# Patient Record
Sex: Female | Born: 1960 | Race: White | Hispanic: No | Marital: Married | Smoking: Former smoker
Health system: Southern US, Community
[De-identification: ages and names within clinical notes are randomized; demographics above are authoritative.]

## PROBLEM LIST (undated history)

## (undated) DIAGNOSIS — C50919 Malignant neoplasm of unspecified site of unspecified female breast: Secondary | ICD-10-CM

## (undated) DIAGNOSIS — Z9221 Personal history of antineoplastic chemotherapy: Secondary | ICD-10-CM

## (undated) DIAGNOSIS — K259 Gastric ulcer, unspecified as acute or chronic, without hemorrhage or perforation: Secondary | ICD-10-CM

## (undated) DIAGNOSIS — G709 Myoneural disorder, unspecified: Secondary | ICD-10-CM

## (undated) DIAGNOSIS — Z923 Personal history of irradiation: Secondary | ICD-10-CM

## (undated) DIAGNOSIS — K219 Gastro-esophageal reflux disease without esophagitis: Secondary | ICD-10-CM

## (undated) DIAGNOSIS — R600 Localized edema: Secondary | ICD-10-CM

## (undated) DIAGNOSIS — M2550 Pain in unspecified joint: Secondary | ICD-10-CM

## (undated) DIAGNOSIS — F419 Anxiety disorder, unspecified: Secondary | ICD-10-CM

## (undated) DIAGNOSIS — F32A Depression, unspecified: Secondary | ICD-10-CM

## (undated) DIAGNOSIS — C801 Malignant (primary) neoplasm, unspecified: Secondary | ICD-10-CM

## (undated) DIAGNOSIS — E559 Vitamin D deficiency, unspecified: Secondary | ICD-10-CM

## (undated) DIAGNOSIS — N200 Calculus of kidney: Secondary | ICD-10-CM

## (undated) DIAGNOSIS — E785 Hyperlipidemia, unspecified: Secondary | ICD-10-CM

## (undated) HISTORY — DX: Vitamin D deficiency, unspecified: E55.9

## (undated) HISTORY — DX: Hyperlipidemia, unspecified: E78.5

## (undated) HISTORY — DX: Localized edema: R60.0

## (undated) HISTORY — DX: Myoneural disorder, unspecified: G70.9

## (undated) HISTORY — DX: Gastric ulcer, unspecified as acute or chronic, without hemorrhage or perforation: K25.9

## (undated) HISTORY — DX: Depression, unspecified: F32.A

## (undated) HISTORY — DX: Pain in unspecified joint: M25.50

## (undated) HISTORY — PX: AUGMENTATION MAMMAPLASTY: SUR837

## (undated) HISTORY — DX: Gastro-esophageal reflux disease without esophagitis: K21.9

## (undated) HISTORY — DX: Calculus of kidney: N20.0

## (undated) HISTORY — PX: BREAST LUMPECTOMY: SHX2

## (undated) HISTORY — DX: Anxiety disorder, unspecified: F41.9

---

## 1998-01-20 HISTORY — PX: COSMETIC SURGERY: SHX468

## 2001-09-13 ENCOUNTER — Encounter: Payer: Self-pay | Admitting: Family Medicine

## 2001-09-13 ENCOUNTER — Encounter: Admission: RE | Admit: 2001-09-13 | Discharge: 2001-09-13 | Payer: Self-pay | Admitting: Family Medicine

## 2001-09-23 ENCOUNTER — Other Ambulatory Visit: Admission: RE | Admit: 2001-09-23 | Discharge: 2001-09-23 | Payer: Self-pay | Admitting: Obstetrics and Gynecology

## 2001-10-01 ENCOUNTER — Encounter: Payer: Self-pay | Admitting: Obstetrics and Gynecology

## 2001-10-01 ENCOUNTER — Encounter: Admission: RE | Admit: 2001-10-01 | Discharge: 2001-10-01 | Payer: Self-pay | Admitting: Obstetrics and Gynecology

## 2003-01-10 ENCOUNTER — Other Ambulatory Visit: Admission: RE | Admit: 2003-01-10 | Discharge: 2003-01-10 | Payer: Self-pay | Admitting: Obstetrics and Gynecology

## 2003-06-12 ENCOUNTER — Ambulatory Visit (HOSPITAL_COMMUNITY): Admission: RE | Admit: 2003-06-12 | Discharge: 2003-06-12 | Payer: Self-pay | Admitting: Urology

## 2004-01-18 ENCOUNTER — Other Ambulatory Visit: Admission: RE | Admit: 2004-01-18 | Discharge: 2004-01-18 | Payer: Self-pay | Admitting: Obstetrics and Gynecology

## 2004-01-29 ENCOUNTER — Encounter: Admission: RE | Admit: 2004-01-29 | Discharge: 2004-01-29 | Payer: Self-pay | Admitting: Obstetrics and Gynecology

## 2004-07-24 ENCOUNTER — Encounter: Admission: RE | Admit: 2004-07-24 | Discharge: 2004-07-24 | Payer: Self-pay | Admitting: Obstetrics and Gynecology

## 2005-01-31 ENCOUNTER — Encounter: Admission: RE | Admit: 2005-01-31 | Discharge: 2005-01-31 | Payer: Self-pay | Admitting: Obstetrics and Gynecology

## 2005-03-18 ENCOUNTER — Other Ambulatory Visit: Admission: RE | Admit: 2005-03-18 | Discharge: 2005-03-18 | Payer: Self-pay | Admitting: Obstetrics and Gynecology

## 2005-05-28 ENCOUNTER — Encounter: Admission: RE | Admit: 2005-05-28 | Discharge: 2005-05-28 | Payer: Self-pay | Admitting: Obstetrics and Gynecology

## 2005-07-10 ENCOUNTER — Encounter: Admission: RE | Admit: 2005-07-10 | Discharge: 2005-07-10 | Payer: Self-pay | Admitting: Urology

## 2006-09-11 ENCOUNTER — Encounter: Admission: RE | Admit: 2006-09-11 | Discharge: 2006-09-11 | Payer: Self-pay | Admitting: Obstetrics and Gynecology

## 2008-03-08 ENCOUNTER — Encounter: Admission: RE | Admit: 2008-03-08 | Discharge: 2008-03-08 | Payer: Self-pay | Admitting: Obstetrics and Gynecology

## 2008-04-14 ENCOUNTER — Emergency Department (HOSPITAL_COMMUNITY): Admission: EM | Admit: 2008-04-14 | Discharge: 2008-04-14 | Payer: Self-pay | Admitting: Family Medicine

## 2009-03-20 ENCOUNTER — Encounter: Admission: RE | Admit: 2009-03-20 | Discharge: 2009-03-20 | Payer: Self-pay | Admitting: Obstetrics and Gynecology

## 2010-02-10 ENCOUNTER — Encounter: Payer: Self-pay | Admitting: Obstetrics and Gynecology

## 2010-05-01 ENCOUNTER — Other Ambulatory Visit: Payer: Self-pay | Admitting: Obstetrics and Gynecology

## 2010-05-01 DIAGNOSIS — Z1231 Encounter for screening mammogram for malignant neoplasm of breast: Secondary | ICD-10-CM

## 2010-05-02 LAB — POCT URINALYSIS DIP (DEVICE)
Bilirubin Urine: NEGATIVE
Glucose, UA: NEGATIVE mg/dL
Ketones, ur: NEGATIVE mg/dL
Nitrite: NEGATIVE

## 2010-05-02 LAB — POCT PREGNANCY, URINE: Preg Test, Ur: NEGATIVE

## 2010-05-08 ENCOUNTER — Ambulatory Visit
Admission: RE | Admit: 2010-05-08 | Discharge: 2010-05-08 | Disposition: A | Discharge status: Home / Self Care | Payer: 59 | Source: Ambulatory Visit | Attending: Obstetrics and Gynecology | Admitting: Obstetrics and Gynecology

## 2010-05-08 DIAGNOSIS — Z1231 Encounter for screening mammogram for malignant neoplasm of breast: Secondary | ICD-10-CM

## 2011-10-06 ENCOUNTER — Other Ambulatory Visit: Payer: Self-pay | Admitting: Obstetrics and Gynecology

## 2011-10-06 DIAGNOSIS — Z1231 Encounter for screening mammogram for malignant neoplasm of breast: Secondary | ICD-10-CM

## 2011-10-06 DIAGNOSIS — Z9882 Breast implant status: Secondary | ICD-10-CM

## 2011-10-21 ENCOUNTER — Ambulatory Visit
Admission: RE | Admit: 2011-10-21 | Discharge: 2011-10-21 | Disposition: A | Discharge status: Home / Self Care | Payer: 59 | Source: Ambulatory Visit | Attending: Obstetrics and Gynecology | Admitting: Obstetrics and Gynecology

## 2011-10-21 DIAGNOSIS — Z9882 Breast implant status: Secondary | ICD-10-CM

## 2011-10-21 DIAGNOSIS — Z1231 Encounter for screening mammogram for malignant neoplasm of breast: Secondary | ICD-10-CM

## 2011-10-23 ENCOUNTER — Other Ambulatory Visit: Payer: Self-pay | Admitting: Obstetrics and Gynecology

## 2011-10-23 DIAGNOSIS — R928 Other abnormal and inconclusive findings on diagnostic imaging of breast: Secondary | ICD-10-CM

## 2011-10-23 DIAGNOSIS — Z9882 Breast implant status: Secondary | ICD-10-CM

## 2011-10-27 ENCOUNTER — Ambulatory Visit
Admission: RE | Admit: 2011-10-27 | Discharge: 2011-10-27 | Disposition: A | Discharge status: Home / Self Care | Payer: 59 | Source: Ambulatory Visit | Attending: Obstetrics and Gynecology | Admitting: Obstetrics and Gynecology

## 2011-10-27 ENCOUNTER — Other Ambulatory Visit: Payer: Self-pay | Admitting: Obstetrics and Gynecology

## 2011-10-27 DIAGNOSIS — R928 Other abnormal and inconclusive findings on diagnostic imaging of breast: Secondary | ICD-10-CM

## 2011-10-27 DIAGNOSIS — Z9882 Breast implant status: Secondary | ICD-10-CM

## 2012-05-14 ENCOUNTER — Other Ambulatory Visit: Payer: Self-pay | Admitting: Occupational Medicine

## 2012-05-14 ENCOUNTER — Ambulatory Visit: Payer: Self-pay

## 2012-05-14 DIAGNOSIS — R7612 Nonspecific reaction to cell mediated immunity measurement of gamma interferon antigen response without active tuberculosis: Secondary | ICD-10-CM

## 2012-09-21 ENCOUNTER — Ambulatory Visit (INDEPENDENT_AMBULATORY_CARE_PROVIDER_SITE_OTHER): Payer: 59 | Admitting: Physician Assistant

## 2012-09-21 VITALS — BP 96/52 | HR 80 | Temp 98.2°F | Resp 16 | Ht 63.0 in | Wt 131.4 lb

## 2012-09-21 DIAGNOSIS — R109 Unspecified abdominal pain: Secondary | ICD-10-CM

## 2012-09-21 DIAGNOSIS — N10 Acute tubulo-interstitial nephritis: Secondary | ICD-10-CM

## 2012-09-21 LAB — POCT CBC
Granulocyte percent: 70.9 %G (ref 37–80)
HCT, POC: 42.2 % (ref 37.7–47.9)
Hemoglobin: 13.6 g/dL (ref 12.2–16.2)
MCH, POC: 30.6 pg (ref 27–31.2)
MID (cbc): 1.2 — AB (ref 0–0.9)
MPV: 9.2 fL (ref 0–99.8)
RBC: 4.44 M/uL (ref 4.04–5.48)
RDW, POC: 13.5 %
WBC: 10.2 10*3/uL (ref 4.6–10.2)

## 2012-09-21 LAB — POCT UA - MICROSCOPIC ONLY
Casts, Ur, LPF, POC: NEGATIVE
Crystals, Ur, HPF, POC: NEGATIVE
Mucus, UA: POSITIVE
Yeast, UA: NEGATIVE

## 2012-09-21 LAB — POCT URINALYSIS DIPSTICK
Bilirubin, UA: NEGATIVE
Glucose, UA: NEGATIVE
Nitrite, UA: NEGATIVE
Spec Grav, UA: 1.015

## 2012-09-21 MED ORDER — CIPROFLOXACIN HCL 500 MG PO TABS: 500.0000 mg | ORAL_TABLET | Freq: Two times a day (BID) | ORAL | Status: DC

## 2012-09-21 NOTE — Progress Notes (Signed)
Subjective:    Patient ID: Alicia Hahn, female    DOB: 04-20-1960, 52 y.o.   MRN: [id number]  HPI   Alicia Hahn is a very pleasant 52 yr old female here with 2 days of right flank pain.  Pt noted some urgency and burning with urination about 6 days ago but that resolved completely about a day later.  Yesterday with right flank and right groin pain.  Today with just right flank pain.  Reports that she has had numerous UTIs in the past but last was probably 7-8 years ago.  Has also had kidney stones x 2.  States this pain is more achy and dull, unlike the sharp intense pain of passing a kidney stone.  States she did have some chills this AM - took Tylenol and then broke into a sweat, not sure if she ever actually had a fever though.  Has not felt ill.  No NV.  No hematuria.  Has not passed anything in her urine.  No vaginal discharge.  Has used Tylenol and Aleve for symptoms.     Review of Systems  Constitutional: Positive for chills. Negative for fever.  HENT: Negative.   Respiratory: Negative.   Cardiovascular: Negative.   Gastrointestinal: Negative.   Genitourinary: Positive for flank pain. Negative for dysuria, urgency, frequency, hematuria and vaginal discharge.  Musculoskeletal: Negative.   Skin: Negative.   Neurological: Negative.        Objective:   Physical Exam  Vitals reviewed. Constitutional: She is oriented to person, place, and time. She appears well-developed and well-nourished. No distress.  HENT:  Head: Normocephalic and atraumatic.  Eyes: Conjunctivae are normal. No scleral icterus.  Cardiovascular: Normal rate, regular rhythm and normal heart sounds.   Pulmonary/Chest: Effort normal and breath sounds normal. She has no wheezes. She has no rales.  Abdominal: Soft. Bowel sounds are normal. There is no tenderness. There is CVA tenderness (right).  Neurological: She is alert and oriented to person, place, and time.  Skin: Skin is warm and dry.  Psychiatric: She  has a normal mood and affect. Her behavior is normal.   Results for orders placed in visit on 09/21/12  POCT URINALYSIS DIPSTICK      Result Value Range   Color, UA yellow     Clarity, UA clear     Glucose, UA neg     Bilirubin, UA neg     Ketones, UA 15     Spec Grav, UA 1.015     Blood, UA trace     pH, UA 7.0     Protein, UA trace     Urobilinogen, UA 0.2     Nitrite, UA neg     Leukocytes, UA large (3+)    POCT UA - MICROSCOPIC ONLY      Result Value Range   WBC, Ur, HPF, POC tntc     RBC, urine, microscopic tntc     Bacteria, U Microscopic 1+     Mucus, UA pos     Epithelial cells, urine per micros 0-2     Crystals, Ur, HPF, POC neg     Casts, Ur, LPF, POC neg     Yeast, UA neg    POCT CBC      Result Value Range   WBC 10.2  4.6 - 10.2 K/uL   Lymph, poc 1.8  0.6 - 3.4   POC LYMPH PERCENT 17.5  10 - 50 %L   MID (cbc) 1.2 (*) 0 -  0.9   POC MID % 11.6  0 - 12 %M   POC Granulocyte 7.2 (*) 2 - 6.9   Granulocyte percent 70.9  37 - 80 %G   RBC 4.44  4.04 - 5.48 M/uL   Hemoglobin 13.6  12.2 - 16.2 g/dL   HCT, POC 16.1  09.6 - 47.9 %   MCV 95.0  80 - 97 fL   MCH, POC 30.6  27 - 31.2 pg   MCHC 32.2  31.8 - 35.4 g/dL   RDW, POC 04.5     Platelet Count, POC 165  142 - 424 K/uL   MPV 9.2  0 - 99.8 fL        Assessment & Plan:  Acute pyelonephritis - Plan: ciprofloxacin (CIPRO) 500 MG tablet  Flank pain - Plan: POCT urinalysis dipstick, POCT UA - Microscopic Only, POCT CBC, Urine culture, ciprofloxacin (CIPRO) 500 MG tablet   Alicia Hahn is a very pleasant 52 yr old female here with Right flank pain and Right CVA tenderness.  I am concerned for a mild acute pyelo given urinary sxs 1 wk ago.  White count is at the upper limit of normal today, but she is afebrile and without NV.  Will send urine cx and start treatment with Cipro BID.  Push fluids.  Discussed RTC precautions.  Will follow up on culture data and adjust therapy if necessary.

## 2012-09-21 NOTE — Patient Instructions (Addendum)
Begin taking the ciprofloxacin as directed tonight.  Be sure to finish the full course of medication (unless you hear otherwise from me.)  Drink plenty of fluids (water is best!)  I will let you know when your culture is back and if we need to make any changes based on that.  Please let me know if any symptoms are worsening or not improving.   Pyelonephritis, Adult Pyelonephritis is a kidney infection. In general, there are 2 main types of pyelonephritis:  Infections that come on quickly without any warning (acute pyelonephritis).  Infections that persist for a long period of time (chronic pyelonephritis). CAUSES  Two main causes of pyelonephritis are:  Bacteria traveling from the bladder to the kidney. This is a problem especially in pregnant women. The urine in the bladder can become filled with bacteria from multiple causes, including:  Inflammation of the prostate gland (prostatitis).  Sexual intercourse in females.  Bladder infection (cystitis).  Bacteria traveling from the bloodstream to the tissue part of the kidney. Problems that may increase your risk of getting a kidney infection include:  Diabetes.  Kidney stones or bladder stones.  Cancer.  Catheters placed in the bladder.  Other abnormalities of the kidney or ureter. SYMPTOMS   Abdominal pain.  Pain in the side or flank area.  Fever.  Chills.  Upset stomach.  Blood in the urine (dark urine).  Frequent urination.  Strong or persistent urge to urinate.  Burning or stinging when urinating. DIAGNOSIS  Your caregiver may diagnose your kidney infection based on your symptoms. A urine sample may also be taken. TREATMENT  In general, treatment depends on how severe the infection is.   If the infection is mild and caught early, your caregiver may treat you with oral antibiotics and send you home.  If the infection is more severe, the bacteria may have gotten into the bloodstream. This will require  intravenous (IV) antibiotics and a hospital stay. Symptoms may include:  High fever.  Severe flank pain.  Shaking chills.  Even after a hospital stay, your caregiver may require you to be on oral antibiotics for a period of time.  Other treatments may be required depending upon the cause of the infection. HOME CARE INSTRUCTIONS   Take your antibiotics as directed. Finish them even if you start to feel better.  Make an appointment to have your urine checked to make sure the infection is gone.  Drink enough fluids to keep your urine clear or pale yellow.  Take medicines for the bladder if you have urgency and frequency of urination as directed by your caregiver. SEEK IMMEDIATE MEDICAL CARE IF:   You have a fever or persistent symptoms for more than 2-3 days.  You have a fever and your symptoms suddenly get worse.  You are unable to take your antibiotics or fluids.  You develop shaking chills.  You experience extreme weakness or fainting.  There is no improvement after 2 days of treatment. MAKE SURE YOU:  Understand these instructions.  Will watch your condition.  Will get help right away if you are not doing well or get worse. Document Released: 01/06/2005 Document Revised: 07/08/2011 Document Reviewed: 06/12/2010 [facility] Patient Information 2014 [place], [place].

## 2012-09-24 LAB — URINE CULTURE

## 2012-09-25 ENCOUNTER — Telehealth: Payer: Self-pay

## 2012-09-25 NOTE — Telephone Encounter (Signed)
Patient returning calling about lab results. [phone] (cell)

## 2012-11-25 ENCOUNTER — Other Ambulatory Visit: Payer: Self-pay

## 2012-12-23 ENCOUNTER — Other Ambulatory Visit: Payer: Self-pay

## 2012-12-23 DIAGNOSIS — Z1231 Encounter for screening mammogram for malignant neoplasm of breast: Secondary | ICD-10-CM

## 2013-01-31 ENCOUNTER — Ambulatory Visit
Admission: RE | Admit: 2013-01-31 | Discharge: 2013-01-31 | Disposition: A | Discharge status: Home / Self Care | Payer: 59 | Source: Ambulatory Visit

## 2013-01-31 DIAGNOSIS — Z1231 Encounter for screening mammogram for malignant neoplasm of breast: Secondary | ICD-10-CM

## 2013-02-02 ENCOUNTER — Other Ambulatory Visit: Payer: Self-pay | Admitting: Obstetrics and Gynecology

## 2013-02-02 DIAGNOSIS — R928 Other abnormal and inconclusive findings on diagnostic imaging of breast: Secondary | ICD-10-CM

## 2013-02-16 ENCOUNTER — Ambulatory Visit
Admission: RE | Admit: 2013-02-16 | Discharge: 2013-02-16 | Disposition: A | Discharge status: Home / Self Care | Payer: 59 | Source: Ambulatory Visit | Attending: Obstetrics and Gynecology | Admitting: Obstetrics and Gynecology

## 2013-02-16 DIAGNOSIS — R928 Other abnormal and inconclusive findings on diagnostic imaging of breast: Secondary | ICD-10-CM

## 2013-11-07 ENCOUNTER — Ambulatory Visit (INDEPENDENT_AMBULATORY_CARE_PROVIDER_SITE_OTHER): Payer: 59 | Admitting: Family Medicine

## 2013-11-07 VITALS — BP 124/80 | HR 69 | Temp 98.1°F | Resp 18 | Ht 63.0 in | Wt 137.0 lb

## 2013-11-07 DIAGNOSIS — N3000 Acute cystitis without hematuria: Secondary | ICD-10-CM

## 2013-11-07 DIAGNOSIS — R350 Frequency of micturition: Secondary | ICD-10-CM

## 2013-11-07 LAB — POCT UA - MICROSCOPIC ONLY
Crystals, Ur, HPF, POC: NEGATIVE
Mucus, UA: NEGATIVE
YEAST UA: NEGATIVE

## 2013-11-07 LAB — POCT URINALYSIS DIPSTICK
Bilirubin, UA: NEGATIVE
Blood, UA: NEGATIVE
Glucose, UA: NEGATIVE
Ketones, UA: 15
Leukocytes, UA: NEGATIVE
Nitrite, UA: POSITIVE
PROTEIN UA: NEGATIVE
Spec Grav, UA: 1.02
Urobilinogen, UA: 0.2
pH, UA: 5

## 2013-11-07 MED ORDER — CIPROFLOXACIN HCL 500 MG PO TABS: 500.0000 mg | ORAL_TABLET | Freq: Two times a day (BID) | ORAL | Status: DC

## 2013-11-07 NOTE — Patient Instructions (Signed)
It does look like you have a UTI.  I will send your urine for a culture to make sure you are on the right antibiotic. Take the cipro twice a day for 5 days.  I gave you enough for 7 days in case your symptoms take longer to clear up. Drink plenty of water and purchase some OTC pyridium to use as needed for symptoms.   Let me know if you are not better in the next couple of days

## 2013-11-07 NOTE — Progress Notes (Signed)
[facility] [address], [place] [address] [phone]  Date:  11/07/2013   Name:  Alicia Hahn   DOB:  1960-08-30   MRN:  [id number]  PCP:  No PCP Per Patient    Chief Complaint: Urinary Frequency   History of Present Illness:  Alicia Hahn is a 53 y.o. very pleasant female patient who presents with the following:  Generally healthy woman here today with possible UTI.  She has noted UTI sx for about 4 days.  She has noted frequency, urgency, bladder pressure.   She has not seen any blood in her urine. No fever or back pain, no vomiting or diarrhea. This does seem like a UTI to her.    There are no active problems to display for this patient.   History reviewed. No pertinent past medical history.  Past Surgical History  Procedure Laterality Date  . Cosmetic surgery Bilateral 2000    breast aug/ lipo    History  Substance Use Topics  . Smoking status: Never Smoker   . Smokeless tobacco: Not on file  . Alcohol Use: No    Family History  Problem Relation Age of Onset  . Diabetes Mother   . Heart disease Mother   . Hyperlipidemia Mother   . Cancer Father   . Diabetes Father   . Cancer Maternal Grandmother     No Known Allergies  Medication list has been reviewed and updated.  Current Outpatient Prescriptions on File Prior to Visit  Medication Sig Dispense Refill  . estradiol (VIVELLE-DOT) 0.075 MG/24HR Place 1 patch onto the skin 2 (two) times a week.      . ciprofloxacin (CIPRO) 500 MG tablet Take 1 tablet (500 mg total) by mouth 2 (two) times daily.  14 tablet  0   No current facility-administered medications on file prior to visit.    Review of Systems:  As per HPI- otherwise negative.   Physical Examination: Filed Vitals:   11/07/13 1349  BP: 124/80  Pulse: 69  Temp: 98.1 F (36.7 C)  Resp: 18   Filed Vitals:   11/07/13 1349  Height: 5\' 3"  (1.6 m)  Weight: 137 lb (62.143 kg)   Body mass index is  24.27 kg/(m^2). Ideal Body Weight: Weight in (lb) to have BMI = 25: 140.8  GEN: WDWN, NAD, Non-toxic, A & O x 3 HEENT: Atraumatic, Normocephalic. Neck supple. No masses, No LAD. Ears and Nose: No external deformity. CV: RRR, No M/G/R. No JVD. No thrill. No extra heart sounds. PULM: CTA B, no wheezes, crackles, rhonchi. No retractions. No resp. distress. No accessory muscle use. ABD: S, NT, ND, +BS. No rebound. No HSM. EXTR: No c/c/e NEURO Normal gait.  PSYCH: Normally interactive. Conversant. Not depressed or anxious appearing.  Calm demeanor.  No CVA tenderness, abdomen is benign  Results for orders placed in visit on 11/07/13  POCT URINALYSIS DIPSTICK      Result Value Ref Range   Color, UA yellow     Clarity, UA hazy     Glucose, UA neg     Bilirubin, UA neg     Ketones, UA 15     Spec Grav, UA 1.020     Blood, UA neg     pH, UA 5.0     Protein, UA neg     Urobilinogen, UA 0.2     Nitrite, UA positive     Leukocytes, UA Negative    POCT UA -  MICROSCOPIC ONLY      Result Value Ref Range   WBC, Ur, HPF, POC 8-16     RBC, urine, microscopic 0-2     Bacteria, U Microscopic 4+     Mucus, UA neg     Epithelial cells, urine per micros 2-10     Crystals, Ur, HPF, POC neg     Casts, Ur, LPF, POC renal tubular     Yeast, UA neg       Assessment and Plan: Acute cystitis without hematuria - Plan: Urine culture, ciprofloxacin (CIPRO) 500 MG tablet  Frequent urination - Plan: POCT urinalysis dipstick, POCT UA - Microscopic Only  Here today with a UTI.  Will treat with cipro, await culture.  She will use pyridium OTC and hydrate well.    Signed Lamar Blinks, MD

## 2013-11-10 LAB — URINE CULTURE: Colony Count: >=100000

## 2014-05-19 ENCOUNTER — Other Ambulatory Visit: Payer: Self-pay

## 2014-05-19 DIAGNOSIS — Z1231 Encounter for screening mammogram for malignant neoplasm of breast: Secondary | ICD-10-CM

## 2014-06-20 ENCOUNTER — Ambulatory Visit
Admission: RE | Admit: 2014-06-20 | Discharge: 2014-06-20 | Disposition: A | Discharge status: Home / Self Care | Payer: 59 | Source: Ambulatory Visit

## 2014-06-20 DIAGNOSIS — Z1231 Encounter for screening mammogram for malignant neoplasm of breast: Secondary | ICD-10-CM

## 2015-04-11 DIAGNOSIS — Z01411 Encounter for gynecological examination (general) (routine) with abnormal findings: Secondary | ICD-10-CM | POA: Diagnosis not present

## 2015-04-11 DIAGNOSIS — N951 Menopausal and female climacteric states: Secondary | ICD-10-CM | POA: Diagnosis not present

## 2015-04-11 DIAGNOSIS — Z13220 Encounter for screening for lipoid disorders: Secondary | ICD-10-CM | POA: Diagnosis not present

## 2015-04-11 DIAGNOSIS — Z1321 Encounter for screening for nutritional disorder: Secondary | ICD-10-CM | POA: Diagnosis not present

## 2015-04-11 DIAGNOSIS — Z01419 Encounter for gynecological examination (general) (routine) without abnormal findings: Secondary | ICD-10-CM | POA: Diagnosis not present

## 2015-04-11 DIAGNOSIS — Z131 Encounter for screening for diabetes mellitus: Secondary | ICD-10-CM | POA: Diagnosis not present

## 2015-04-11 DIAGNOSIS — Z1329 Encounter for screening for other suspected endocrine disorder: Secondary | ICD-10-CM | POA: Diagnosis not present

## 2015-04-11 DIAGNOSIS — Z6826 Body mass index (BMI) 26.0-26.9, adult: Secondary | ICD-10-CM | POA: Diagnosis not present

## 2015-04-11 DIAGNOSIS — Z30432 Encounter for removal of intrauterine contraceptive device: Secondary | ICD-10-CM | POA: Diagnosis not present

## 2015-04-12 MED FILL — VIT D2 1.25 MG (50,000 UNIT: 1.25 MG | 70 days supply | Qty: 10 | Fill #0

## 2015-04-12 MED FILL — COMBIPATCH 0.05-0.25 MG PTC: 0.05-0.25 | 28 days supply | Qty: 8 | Fill #0 | Status: TO

## 2015-05-07 MED FILL — COMBIPATCH 0.05-0.25 MG PTC: 0.05-0.25 | 28 days supply | Qty: 8 | Fill #0

## 2015-06-06 MED FILL — COMBIPATCH 0.05-0.25 MG PTC: 0.05-0.25 | 84 days supply | Qty: 24 | Fill #1

## 2015-08-21 MED FILL — COMBIPATCH 0.05-0.25 MG PTC: 0.05-0.25 | 84 days supply | Qty: 24 | Fill #2

## 2015-09-17 ENCOUNTER — Other Ambulatory Visit: Payer: Self-pay | Admitting: Obstetrics and Gynecology

## 2015-09-17 DIAGNOSIS — Z1231 Encounter for screening mammogram for malignant neoplasm of breast: Secondary | ICD-10-CM

## 2015-09-26 ENCOUNTER — Other Ambulatory Visit: Payer: Self-pay | Admitting: Obstetrics and Gynecology

## 2015-09-26 ENCOUNTER — Ambulatory Visit
Admission: RE | Admit: 2015-09-26 | Discharge: 2015-09-26 | Disposition: A | Discharge status: Home / Self Care | Payer: 59 | Source: Ambulatory Visit | Attending: Obstetrics and Gynecology | Admitting: Obstetrics and Gynecology

## 2015-09-26 ENCOUNTER — Ambulatory Visit
Admission: RE | Admit: 2015-09-26 | Discharge: 2015-09-26 | Disposition: A | Discharge status: Home / Self Care | Payer: Self-pay | Source: Ambulatory Visit | Attending: Obstetrics and Gynecology | Admitting: Obstetrics and Gynecology

## 2015-09-26 DIAGNOSIS — Z1231 Encounter for screening mammogram for malignant neoplasm of breast: Secondary | ICD-10-CM | POA: Diagnosis not present

## 2015-09-28 ENCOUNTER — Other Ambulatory Visit: Payer: Self-pay | Admitting: Obstetrics and Gynecology

## 2015-09-28 DIAGNOSIS — R928 Other abnormal and inconclusive findings on diagnostic imaging of breast: Secondary | ICD-10-CM

## 2015-10-04 ENCOUNTER — Ambulatory Visit
Admission: RE | Admit: 2015-10-04 | Discharge: 2015-10-04 | Disposition: A | Discharge status: Home / Self Care | Payer: 59 | Source: Ambulatory Visit | Attending: Obstetrics and Gynecology | Admitting: Obstetrics and Gynecology

## 2015-10-04 DIAGNOSIS — R928 Other abnormal and inconclusive findings on diagnostic imaging of breast: Secondary | ICD-10-CM

## 2015-10-04 DIAGNOSIS — R921 Mammographic calcification found on diagnostic imaging of breast: Secondary | ICD-10-CM | POA: Diagnosis not present

## 2015-11-01 DIAGNOSIS — H5201 Hypermetropia, right eye: Secondary | ICD-10-CM | POA: Diagnosis not present

## 2015-11-16 MED FILL — COMBIPATCH 0.05-0.25 MG PTC: 0.05-0.25 | 84 days supply | Qty: 24 | Fill #0

## 2016-04-15 DIAGNOSIS — N938 Other specified abnormal uterine and vaginal bleeding: Secondary | ICD-10-CM | POA: Diagnosis not present

## 2016-04-15 DIAGNOSIS — Z6829 Body mass index (BMI) 29.0-29.9, adult: Secondary | ICD-10-CM | POA: Diagnosis not present

## 2016-04-15 DIAGNOSIS — N950 Postmenopausal bleeding: Secondary | ICD-10-CM | POA: Diagnosis not present

## 2016-04-15 DIAGNOSIS — Z01411 Encounter for gynecological examination (general) (routine) with abnormal findings: Secondary | ICD-10-CM | POA: Diagnosis not present

## 2016-04-15 DIAGNOSIS — Z01419 Encounter for gynecological examination (general) (routine) without abnormal findings: Secondary | ICD-10-CM | POA: Diagnosis not present

## 2016-12-16 ENCOUNTER — Other Ambulatory Visit: Payer: Self-pay | Admitting: Obstetrics and Gynecology

## 2016-12-16 DIAGNOSIS — Z139 Encounter for screening, unspecified: Secondary | ICD-10-CM

## 2017-01-15 ENCOUNTER — Ambulatory Visit
Admission: RE | Admit: 2017-01-15 | Discharge: 2017-01-15 | Disposition: A | Discharge status: Home / Self Care | Payer: 59 | Source: Ambulatory Visit | Attending: Obstetrics and Gynecology | Admitting: Obstetrics and Gynecology

## 2017-01-15 DIAGNOSIS — Z1231 Encounter for screening mammogram for malignant neoplasm of breast: Secondary | ICD-10-CM | POA: Diagnosis not present

## 2017-01-15 DIAGNOSIS — Z139 Encounter for screening, unspecified: Secondary | ICD-10-CM

## 2017-01-21 DIAGNOSIS — N939 Abnormal uterine and vaginal bleeding, unspecified: Secondary | ICD-10-CM | POA: Diagnosis not present

## 2017-02-02 DIAGNOSIS — N950 Postmenopausal bleeding: Secondary | ICD-10-CM | POA: Diagnosis not present

## 2017-02-02 DIAGNOSIS — Z30430 Encounter for insertion of intrauterine contraceptive device: Secondary | ICD-10-CM | POA: Diagnosis not present

## 2017-05-11 ENCOUNTER — Encounter (INDEPENDENT_AMBULATORY_CARE_PROVIDER_SITE_OTHER): Payer: Self-pay | Admitting: Orthopaedic Surgery

## 2017-05-11 ENCOUNTER — Ambulatory Visit (INDEPENDENT_AMBULATORY_CARE_PROVIDER_SITE_OTHER): Payer: 59

## 2017-05-11 ENCOUNTER — Ambulatory Visit (INDEPENDENT_AMBULATORY_CARE_PROVIDER_SITE_OTHER): Payer: 59 | Admitting: Orthopaedic Surgery

## 2017-05-11 DIAGNOSIS — M7541 Impingement syndrome of right shoulder: Secondary | ICD-10-CM

## 2017-05-11 DIAGNOSIS — M25511 Pain in right shoulder: Secondary | ICD-10-CM

## 2017-05-11 MED ORDER — METHYLPREDNISOLONE ACETATE 40 MG/ML IJ SUSP
40.0000 mg | INTRAMUSCULAR | Status: AC | PRN
  Administered 2017-05-11: 40 mg via INTRA_ARTICULAR

## 2017-05-11 MED ORDER — LIDOCAINE HCL 1 % IJ SOLN
3.0000 mL | INTRAMUSCULAR | Status: AC | PRN
  Administered 2017-05-11: 3 mL

## 2017-05-11 NOTE — Progress Notes (Signed)
Office Visit Note   Patient: Alicia Hahn           Date of Birth: [birth year]           MRN: [id number] Visit Date: 05/11/2017              Requested by: No referring provider defined for this encounter. PCP: Patient, No Pcp Per   Assessment & Plan: Visit Diagnoses:  1. Acute pain of right shoulder   2. Impingement syndrome of right shoulder     Plan: She tolerated the steroid injection well in her right shoulder after I recommended this.  I explained the risk and benefits of injections as well.  I talked about things we need to bring her back.  Also showed her exercises to avoid with her shoulders.  All questions concerns were answered and addressed.  She will follow-up as needed.  Follow-Up Instructions: Return if symptoms worsen or fail to improve.   Orders:  Orders Placed This Encounter  Procedures  . Large Joint Inj  . Large Joint Inj  . XR Shoulder Right   No orders of the defined types were placed in this encounter.     Procedures: Large Joint Inj: R subacromial bursa on 05/11/2017 3:18 PM Indications: pain and diagnostic evaluation Details: 22 G 1.5 in needle  Arthrogram: No  Medications: 3 mL lidocaine 1 %; 40 mg methylPREDNISolone acetate 40 MG/ML Outcome: tolerated well, no immediate complications Procedure, treatment alternatives, risks and benefits explained, specific risks discussed. Consent was given by the patient. Immediately prior to procedure a time out was called to verify the correct patient, procedure, equipment, support staff and site/side marked as required. Patient was prepped and draped in the usual sterile fashion.       Clinical Data: No additional findings.   Subjective: Chief Complaint  Patient presents with  . Right Shoulder - Pain  The patient is someone I seen before but is been quite some time.  She comes in today with acute right shoulder pain without any specific injury.  Slowly started worsening from waking up at  night to performing activities of daily living such as reaching above her head and behind her.  It only hurts around the shoulder.  She denies any neck pain he denies any numbness and tingling in her hand.  She tried Aleve for about a month and it started causing gastritis.  She does tend to sleep with her arm above her head.  She again denies any injury specifically to that shoulder.  This is been slowly worsening over 2 months.  HPI  Review of Systems She currently denies any headache, chest pain, shortness of breath, fever, chills, nausea, vomiting.  She has not a smoker and is not a diabetic.  Objective: Vital Signs: There were no vitals taken for this visit.  Physical Exam She is alert and oriented x3 and in no acute distress Ortho Exam Examination of her left shoulder is normal examination of her right shoulder shows fluid and full range of motion she has pain with internal rotation and adduction.  She also has positive Neer and Hawkins signs.  She has 5 out of 5 rotator cuff strength and negative liftoff. Specialty Comments:  No specialty comments available.  Imaging: Xr Shoulder Right  Result Date: 05/11/2017 3 views of the right shoulder show well located shoulder with no acute findings otherwise.    PMFS History: Patient Active Problem List   Diagnosis Date Noted  . Impingement  syndrome of right shoulder 05/11/2017   History reviewed. No pertinent past medical history.  Family History  Problem Relation Age of Onset  . Diabetes Mother   . Heart disease Mother   . Hyperlipidemia Mother   . Cancer Father   . Diabetes Father   . Cancer Maternal Grandmother   . Breast cancer Neg Hx     Past Surgical History:  Procedure Laterality Date  . COSMETIC SURGERY Bilateral 2000   breast aug/ lipo   Social History   Occupational History  . Not on file  Tobacco Use  . Smoking status: Never Smoker  Substance and Sexual Activity  . Alcohol use: No  . Drug use: No  .  Sexual activity: Not on file

## 2017-06-01 DIAGNOSIS — Z01419 Encounter for gynecological examination (general) (routine) without abnormal findings: Secondary | ICD-10-CM | POA: Diagnosis not present

## 2017-06-01 DIAGNOSIS — Z6830 Body mass index (BMI) 30.0-30.9, adult: Secondary | ICD-10-CM | POA: Diagnosis not present

## 2017-06-01 DIAGNOSIS — Z803 Family history of malignant neoplasm of breast: Secondary | ICD-10-CM | POA: Diagnosis not present

## 2017-06-01 DIAGNOSIS — Z8042 Family history of malignant neoplasm of prostate: Secondary | ICD-10-CM | POA: Diagnosis not present

## 2017-07-15 DIAGNOSIS — Z809 Family history of malignant neoplasm, unspecified: Secondary | ICD-10-CM | POA: Diagnosis not present

## 2017-12-28 DIAGNOSIS — H524 Presbyopia: Secondary | ICD-10-CM | POA: Diagnosis not present

## 2017-12-28 DIAGNOSIS — H53021 Refractive amblyopia, right eye: Secondary | ICD-10-CM | POA: Diagnosis not present

## 2017-12-28 DIAGNOSIS — H5201 Hypermetropia, right eye: Secondary | ICD-10-CM | POA: Diagnosis not present

## 2018-11-02 ENCOUNTER — Other Ambulatory Visit: Payer: Self-pay

## 2018-11-02 ENCOUNTER — Other Ambulatory Visit: Payer: Self-pay | Admitting: Obstetrics and Gynecology

## 2018-11-02 ENCOUNTER — Ambulatory Visit (INDEPENDENT_AMBULATORY_CARE_PROVIDER_SITE_OTHER): Payer: 59 | Admitting: Family Medicine

## 2018-11-02 ENCOUNTER — Encounter: Payer: Self-pay | Admitting: Family Medicine

## 2018-11-02 VITALS — BP 125/81 | HR 78 | Resp 16 | Ht 63.0 in | Wt 172.4 lb

## 2018-11-02 DIAGNOSIS — M7918 Myalgia, other site: Secondary | ICD-10-CM | POA: Insufficient documentation

## 2018-11-02 DIAGNOSIS — Z23 Encounter for immunization: Secondary | ICD-10-CM

## 2018-11-02 DIAGNOSIS — M722 Plantar fascial fibromatosis: Secondary | ICD-10-CM | POA: Diagnosis not present

## 2018-11-02 DIAGNOSIS — Z7689 Persons encountering health services in other specified circumstances: Secondary | ICD-10-CM

## 2018-11-02 DIAGNOSIS — Z1231 Encounter for screening mammogram for malignant neoplasm of breast: Secondary | ICD-10-CM

## 2018-11-02 DIAGNOSIS — Z803 Family history of malignant neoplasm of breast: Secondary | ICD-10-CM | POA: Diagnosis not present

## 2018-11-02 NOTE — Patient Instructions (Addendum)
Look into the [place] Therapist, occupational for recommendations for female health maintenance.  You may also look into the ACOG Guidelines for pap smears.   -Please see separate printed handout on plantar fasciitis for exercises and more information on that.    Please realize, EXERCISE IS MEDICINE!  -  American Heart Association [facility]) guidelines for exercise : If you are in good health, without any medical conditions, you should engage in 150-300 minutes of moderate intensity aerobic activity per week.  This means you should be huffing and puffing throughout your workout.   Engaging in regular exercise will improve brain function and memory, as well as improve mood, boost immune system and help with weight management.  As well as the other, more well-known effects of exercise such as decreasing blood sugar levels, decreasing blood pressure,  and decreasing bad cholesterol levels/ increasing good cholesterol levels.     -  The AHA strongly endorses consumption of a diet that contains a variety of foods from all the food categories with an emphasis on fruits and vegetables; fat-free and low-fat dairy products; cereal and grain products; legumes and nuts; and fish, poultry, and/or extra lean meats.    Excessive food intake, especially of foods high in saturated and trans fats, sugar, and salt, should be avoided.    Adequate water intake of roughly 1/2 of your weight in pounds, should equal the ounces of water per day you should drink.  So for instance, if you're 200 pounds, that would be 100 ounces of water per day.         Mediterranean Diet  Why follow it? Research shows  Those who follow the Mediterranean diet have a reduced risk of heart disease   The diet is associated with a reduced incidence of Parkinson's and Alzheimer's diseases  People following the diet may have longer life expectancies and lower rates of chronic diseases   The Dietary Guidelines for Americans recommends  the Mediterranean diet as an eating plan to promote health and prevent disease  What Is the Mediterranean Diet?   Healthy eating plan based on typical foods and recipes of Mediterranean-style cooking  The diet is primarily a plant based diet; these foods should make up a majority of meals   Starches - Plant based foods should make up a majority of meals - They are an important sources of vitamins, minerals, energy, antioxidants, and fiber - Choose whole grains, foods high in fiber and minimally processed items  - Typical grain sources include wheat, oats, barley, corn, brown rice, bulgar, farro, millet, polenta, couscous  - Various types of beans include chickpeas, lentils, fava beans, black beans, white beans   Fruits  Veggies - Large quantities of antioxidant rich fruits & veggies; 6 or more servings  - Vegetables can be eaten raw or lightly drizzled with oil and cooked  - Vegetables common to the traditional Mediterranean Diet include: artichokes, arugula, beets, broccoli, brussel sprouts, cabbage, carrots, celery, collard greens, cucumbers, eggplant, kale, leeks, lemons, lettuce, mushrooms, okra, onions, peas, peppers, potatoes, pumpkin, radishes, rutabaga, shallots, spinach, sweet potatoes, turnips, zucchini - Fruits common to the Mediterranean Diet include: apples, apricots, avocados, cherries, clementines, dates, figs, grapefruits, grapes, melons, nectarines, oranges, peaches, pears, pomegranates, strawberries, tangerines  Fats - Replace butter and margarine with healthy oils, such as olive oil, canola oil, and tahini  - Limit nuts to no more than a handful a day  - Nuts include walnuts, almonds, pecans, pistachios, pine nuts  -  Limit or avoid candied, honey roasted or heavily salted nuts - Olives are central to the Mediterranean diet - can be eaten whole or used in a variety of dishes   Meats Protein - Limiting red meat: no more than a few times a month - When eating red meat: choose  lean cuts and keep the portion to the size of deck of cards - Eggs: approx. 0 to 4 times a week  - Fish and lean poultry: at least 2 a week  - Healthy protein sources include, chicken, [place], lean beef, lamb - Increase intake of seafood such as tuna, salmon, trout, mackerel, shrimp, scallops - Avoid or limit high fat processed meats such as sausage and bacon  Dairy - Include moderate amounts of low fat dairy products  - Focus on healthy dairy such as fat free yogurt, skim milk, low or reduced fat cheese - Limit dairy products higher in fat such as whole or 2% milk, cheese, ice cream  Alcohol - Moderate amounts of red wine is ok  - No more than 5 oz daily for women (all ages) and men older than age 76  - No more than 10 oz of wine daily for men younger than 73  Other - Limit sweets and other desserts  - Use herbs and spices instead of salt to flavor foods  - Herbs and spices common to the traditional Mediterranean Diet include: basil, bay leaves, chives, cloves, cumin, fennel, garlic, lavender, marjoram, mint, oregano, parsley, pepper, rosemary, sage, savory, sumac, tarragon, thyme   Its not just a diet, its a lifestyle:   The Mediterranean diet includes lifestyle factors typical of those in the region   Foods, drinks and meals are best eaten with others and savored  Daily physical activity is important for overall good health  This could be strenuous exercise like running and aerobics  This could also be more leisurely activities such as walking, housework, yard-work, or taking the stairs  Moderation is the key; a balanced and healthy diet accommodates most foods and drinks  Consider portion sizes and frequency of consumption of certain foods   Meal Ideas & Options:   Breakfast:  o Whole wheat toast or whole wheat English muffins with peanut butter & hard boiled egg o Steel cut oats topped with apples & cinnamon and skim milk  o Fresh fruit: banana, strawberries, melon,  berries, peaches  o Smoothies: strawberries, bananas, greek yogurt, peanut butter o Low fat greek yogurt with blueberries and granola  o Egg white omelet with spinach and mushrooms o Breakfast couscous: whole wheat couscous, apricots, skim milk, cranberries   Sandwiches:  o Hummus and grilled vegetables (peppers, zucchini, squash) on whole wheat bread   o Grilled chicken on whole wheat pita with lettuce, tomatoes, cucumbers or tzatziki  o Tuna salad on whole wheat bread: tuna salad made with greek yogurt, olives, red peppers, capers, green onions o Garlic rosemary lamb pita: lamb sauted with garlic, rosemary, salt & pepper; add lettuce, cucumber, greek yogurt to pita - flavor with lemon juice and black pepper   Seafood:  o Mediterranean grilled salmon, seasoned with garlic, basil, parsley, lemon juice and black pepper o Shrimp, lemon, and spinach whole-grain pasta salad made with low fat greek yogurt  o Seared scallops with lemon orzo  o Seared tuna steaks seasoned salt, pepper, coriander topped with tomato mixture of olives, tomatoes, olive oil, minced garlic, parsley, green onions and cappers   Meats:  o Herbed greek chicken salad  with kalamata olives, cucumber, feta  o Red bell peppers stuffed with spinach, bulgur, lean ground beef (or lentils) & topped with feta   o Kebabs: skewers of chicken, tomatoes, onions, zucchini, squash  o [place] burgers: made with red onions, mint, dill, lemon juice, feta cheese topped with roasted red peppers  Vegetarian o Cucumber salad: cucumbers, artichoke hearts, celery, red onion, feta cheese, tossed in olive oil & lemon juice  o Hummus and whole grain pita points with a greek salad (lettuce, tomato, feta, olives, cucumbers, red onion) o Lentil soup with celery, carrots made with vegetable broth, garlic, salt and pepper  o Tabouli salad: parsley, bulgur, mint, scallions, cucumbers, tomato, radishes, lemon juice, olive oil, salt and pepper.

## 2018-11-02 NOTE — Progress Notes (Signed)
New patient office visit note:  Impression and Recommendations:    1. Encounter to establish care with new doctor   2. Family history of breast cancer in sister , and mother   60. Plantar fasciitis of left foot   4. Muscle ache of extremity-bilateral calf muscles   5. Need for immunization against influenza     Encounter to Establish Care with New Doctor - Extensive discussion held with patient regarding establishing as a new patient.  Discussed policies and practices here at the clinic, and answered all questions about care team and health management during appointment.  - Discussed need for patient to continue to obtain management and screenings with all established specialists.  Educated patient at length about the critical importance of keeping health maintenance up to date.  - Participated in lengthy conversation and all questions were answered.  - Discussed need for yearly physical and baseline lab work in near future.  - Influenza vaccination obtained today.  Female Health - Family Hx Breast Cancer - Given family history of breast cancer, discussed need for patient to return to OBGYN for hormonal concerns and ongoing female health maintenance as advised.  Plantar Fasciitis of Left Foot, Muscle Ache of Extremity - Education provided today regarding causes of patient sx.  Handout provided.  - Advised patient to prudently stretch affected fascia and ice the area of pain. - Demonstrated stretching techniques with patient during appointment today.  - To help prevent plantar fasciitis, recommended obtaining appropriate shoes for prudent arch support. - Advised patient to wear her supportive shoes at all times, even in the house.  - Extensive education provided and all questions answered. - Discussed that recovery will take 2-4 weeks with management as advised.  - Will continue to monitor.  BMI Counseling - Body mass index is 30.54 kg/m Explained to patient what  BMI refers to, and what it means medically.    Told patient to think about it as a "medical risk stratification measurement" and how increasing BMI is associated with increasing risk/ or worsening state of various diseases such as hypertension, hyperlipidemia, diabetes, premature OA, depression etc.  American Heart Association guidelines for healthy diet, basically Mediterranean diet, and exercise guidelines of 30 minutes 5 days per week or more discussed in detail.  Health counseling performed.  All questions answered.  Lifestyle & Preventative Health Maintenance - Advised patient to continue working toward exercising to improve overall mental, physical, and emotional health.    - Reviewed the "spokes of the wheel" of mood and health management.  Stressed the importance of ongoing prudent habits, including regular exercise, appropriate sleep hygiene, healthful dietary habits, and prayer/meditation to relax.  - Encouraged patient to engage in daily physical activity, especially a formal exercise routine.  Recommended that the patient eventually strive for at least 150 minutes of moderate cardiovascular activity per week according to guidelines established by the [facility].   - Healthy dietary habits encouraged, including low-carb, and high amounts of lean protein in diet.   - Patient should also consume adequate amounts of water.    Education and routine counseling performed. Handouts provided.   Orders Placed This Encounter  Procedures  . Flu Vaccine QUAD 36+ mos IM    Medications Discontinued During This Encounter  Medication Reason  . ciprofloxacin (CIPRO) 500 MG tablet Change in therapy  . estradiol (VIVELLE-DOT) 0.075 MG/24HR Change in therapy     Gross side effects, risk and benefits, and alternatives of medications discussed  with patient.  Patient is aware that all medications have potential side effects and we are unable to predict every side effect or drug-drug interaction that  may occur.  Expresses verbal understanding and consents to current therapy plan and treatment regimen.  Return for CPE and fasting lab work in near future..  Please see AVS handed out to patient at the end of our visit for further patient instructions/ counseling done pertaining to today's office visit.    Note:  This document was prepared using Dragon voice recognition software and may include unintentional dictation errors.  This document serves as a record of services personally performed by Mellody Dance, DO. It was created on her behalf by Toni Amend, a trained medical scribe. The creation of this record is based on the scribe's personal observations and the provider's statements to them.   I have reviewed the above medical documentation for accuracy and completeness and I concur.  Mellody Dance, DO 11/03/2018 1:53 PM        ---------------------------------------------------------------------------------------------------------------------------------------------------------------------------------------------    Subjective:    Chief complaint:   Chief Complaint  Patient presents with  . New Patient (Initial Visit)    sleep issues     HPI: Alicia Hahn is a pleasant 58 y.o. female who presents to [place] [facility] today to review their medical history with me and establish care.   I asked the patient to review their chronic problem list with me to ensure everything was updated and accurate.    All recent office visits with other providers, any medical records that patient brought in etc  - I reviewed today.     We asked pt to get [place] their medical records from [facility] providers/ specialists that they had seen within the past 3-5 years- if they are in private practice and/or do not work for [employer], [facility], [place], [place] or [employer] owned practice.  Told them to call their specialists to clarify this if they are not sure.     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  Married, monogamous with husband. Her husband is also retired from [employer].  Has two children.  Alcohol Use Has about 2 glasses of wine 3 times per week.  Family History Sister passed away at age 40 of breast cancer. Notes "she wasn't even old enough to get a mammogram yet."  Maternal aunt passed away in 39's of breast cancer but lived for 10 years after treatment. Mom had DCIS in her late 69's and had treatment.  They have had genetic testing done "and everything is okay."  Past Medical History Denies history of high blood pressure or other chronic diagnoses.  Denies swelling in the legs.  - Left Foot & LLE Pain, Pt Suspects Plantar Fasciitis States thinks she has plantar fasciitis in the left leg.  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  Says "I can't sleep at night for the aches in my legs."  Notes the pain is especially located in the arch of her foot.  Says she is tending to over-pronate now and "the older I get, the worse my posture."  Note also "I've got these veins that are popping; I felt one pop the other day."  - History of Cosmetic Surgery History of breast augmentation and liposuction.  Notes "now you can't tell."  - Female Health Had two normal pregnancies.  Follows up regularly with [facility]. Notes follows yearly for pap smears and states "I don't feel like I need pap smears every year."  Denies history of abnormal pap, denies family history of cervical cancer.  Notes she uses an estrogen patch and has an IUD in  place for progesterone.  Since her mother and sister had breast cancer, patient has been getting mammograms "forever."  Receives them at the breast center.    Wt Readings from Last 3 Encounters:  11/02/18 172 lb 6.4 oz (78.2 kg)  11/07/13 137 lb (62.1 kg)  09/21/12 131 lb 6.4 oz (59.6 kg)   BP Readings from Last 3 Encounters:  11/02/18 125/81  11/07/13 124/80  09/21/12 (!) 96/52   Pulse Readings from Last 3 Encounters:  11/02/18 78  11/07/13 69  09/21/12 80   BMI Readings from Last 3 Encounters:  11/02/18 30.54 kg/m  11/07/13 24.27 kg/m  09/21/12 23.28 kg/m    Patient Care Team    Relationship Specialty Notifications Start End  Mellody Dance, DO PCP - General Family Medicine  11/02/18   Mcarthur Rossetti, [place] Physician Orthopedic Surgery  11/02/18   Molli Posey, MD Consulting Physician Obstetrics and Gynecology  11/02/18     Patient Active Problem List   Diagnosis Date Noted  . Family history of breast cancer in sister , and mother 11/02/2018  . Plantar fasciitis of left foot 11/02/2018  . Muscle ache of extremity-bilateral calf muscles 11/02/2018  . Impingement syndrome of right shoulder 05/11/2017       As reported by pt:  History reviewed. No pertinent past medical history.   Past Surgical History:  Procedure Laterality Date  . COSMETIC SURGERY Bilateral 2000   breast aug/ lipo     Family History  Problem Relation Age of Onset  . Diabetes Mother   . Heart disease Mother   . Hyperlipidemia Mother   . Cancer Father   . Diabetes Father   . Cancer Maternal Grandmother   . Breast cancer Neg Hx      Social History   Substance and Sexual Activity  Drug Use No     Social History   Substance and Sexual Activity  Alcohol Use Yes  . Alcohol/week: 6.0 standard drinks  . Types: 6 Glasses of wine per week     Social History   Tobacco Use  Smoking Status Former Smoker  Smokeless Tobacco Never Used  Tobacco Comment    1982     Current Meds  Medication Sig  . CALCIUM PO Take 1,000 mg by mouth daily.  Marland Kitchen estradiol (VIVELLE-DOT) 0.05 MG/24HR patch Place 0.5 mg onto the skin 2 (two) times a week.  Marland Kitchen levonorgestrel (MIRENA) 20 MCG/24HR IUD 1 each by Intrauterine route once.  Marland Kitchen VITAMIN D PO Take 12,000 Units by mouth daily.    Allergies: Patient has no known allergies.   Review of Systems  Constitutional: Negative for chills, diaphoresis, fever, malaise/fatigue and weight loss.  HENT: Negative for congestion, sore throat and tinnitus.   Eyes: Negative for blurred vision, double vision and photophobia.  Respiratory: Negative for cough and wheezing.   Cardiovascular:  Negative for chest pain and palpitations.  Gastrointestinal: Negative for blood in stool, diarrhea, nausea and vomiting.  Genitourinary: Negative for dysuria, frequency and urgency.  Musculoskeletal: Negative for joint pain and myalgias.  Skin: Negative for itching and rash.  Neurological: Negative for dizziness, focal weakness, weakness and headaches.  Endo/Heme/Allergies: Negative for environmental allergies and polydipsia. Does not bruise/bleed easily.  Psychiatric/Behavioral: Negative for depression and memory loss. The patient is not nervous/anxious and does not have insomnia.         Objective:   Blood pressure 125/81, pulse 78, resp. rate 16, height 5\' 3"  (1.6 m), weight 172 lb 6.4 oz (78.2 kg), SpO2 100 %. Body mass index is 30.54 kg/m. General: Well Developed, well nourished, and in no acute distress.  Neuro: Alert and oriented x3, extra-ocular muscles intact, sensation grossly intact.  HEENT:[place]/AT, PERRLA, neck supple, No carotid bruits Skin: no gross rashes  Cardiac: Regular rate and rhythm Respiratory: Essentially clear to auscultation bilaterally. Not using accessory muscles, speaking in full sentences.  Abdominal: not grossly distended Musculoskeletal: Ambulates w/o diff, FROM * 4 ext.  Vasc: less 2 sec cap RF,  warm and pink  Psych:  No HI/SI, judgement and insight good, Euthymic mood. Full Affect. Left Foot:  Left plantar surface of foot with pain on dorsal flexion in the distribution of the plantar fascia and tender to palpation along plantar fascia, most prominent in the plantar arch region.  No other abnormalities on exam of foot

## 2018-12-20 ENCOUNTER — Ambulatory Visit
Admission: RE | Admit: 2018-12-20 | Discharge: 2018-12-20 | Disposition: A | Discharge status: Home / Self Care | Payer: 59 | Source: Ambulatory Visit | Attending: Obstetrics and Gynecology | Admitting: Obstetrics and Gynecology

## 2018-12-20 ENCOUNTER — Other Ambulatory Visit: Payer: Self-pay

## 2018-12-20 DIAGNOSIS — Z1231 Encounter for screening mammogram for malignant neoplasm of breast: Secondary | ICD-10-CM

## 2018-12-21 ENCOUNTER — Other Ambulatory Visit: Payer: Self-pay | Admitting: Obstetrics and Gynecology

## 2018-12-21 DIAGNOSIS — R928 Other abnormal and inconclusive findings on diagnostic imaging of breast: Secondary | ICD-10-CM

## 2018-12-22 ENCOUNTER — Other Ambulatory Visit: Payer: Self-pay | Admitting: Obstetrics and Gynecology

## 2018-12-22 ENCOUNTER — Ambulatory Visit
Admission: RE | Admit: 2018-12-22 | Discharge: 2018-12-22 | Disposition: A | Discharge status: Home / Self Care | Payer: 59 | Source: Ambulatory Visit | Attending: Obstetrics and Gynecology | Admitting: Obstetrics and Gynecology

## 2018-12-22 ENCOUNTER — Other Ambulatory Visit: Payer: Self-pay

## 2018-12-22 DIAGNOSIS — C50919 Malignant neoplasm of unspecified site of unspecified female breast: Secondary | ICD-10-CM | POA: Insufficient documentation

## 2018-12-22 DIAGNOSIS — N6310 Unspecified lump in the right breast, unspecified quadrant: Secondary | ICD-10-CM

## 2018-12-22 DIAGNOSIS — R928 Other abnormal and inconclusive findings on diagnostic imaging of breast: Secondary | ICD-10-CM

## 2018-12-23 ENCOUNTER — Ambulatory Visit
Admission: RE | Admit: 2018-12-23 | Discharge: 2018-12-23 | Disposition: A | Discharge status: Home / Self Care | Payer: 59 | Source: Ambulatory Visit | Attending: Obstetrics and Gynecology | Admitting: Obstetrics and Gynecology

## 2018-12-23 DIAGNOSIS — N6310 Unspecified lump in the right breast, unspecified quadrant: Secondary | ICD-10-CM

## 2018-12-28 ENCOUNTER — Encounter: Payer: Self-pay | Admitting: *Deleted

## 2018-12-28 DIAGNOSIS — Z170 Estrogen receptor positive status [ER+]: Secondary | ICD-10-CM

## 2018-12-28 DIAGNOSIS — C50411 Malignant neoplasm of upper-outer quadrant of right female breast: Secondary | ICD-10-CM

## 2018-12-29 ENCOUNTER — Other Ambulatory Visit: Payer: Self-pay

## 2018-12-29 ENCOUNTER — Ambulatory Visit: Payer: Self-pay | Admitting: Surgery

## 2018-12-29 ENCOUNTER — Inpatient Hospital Stay: Payer: 59 | Attending: Hematology

## 2018-12-29 ENCOUNTER — Inpatient Hospital Stay (HOSPITAL_BASED_OUTPATIENT_CLINIC_OR_DEPARTMENT_OTHER): Payer: 59 | Admitting: Hematology

## 2018-12-29 ENCOUNTER — Other Ambulatory Visit: Payer: Self-pay | Admitting: *Deleted

## 2018-12-29 ENCOUNTER — Ambulatory Visit
Admission: RE | Admit: 2018-12-29 | Discharge: 2018-12-29 | Disposition: A | Discharge status: Home / Self Care | Payer: 59 | Source: Ambulatory Visit | Attending: Radiation Oncology | Admitting: Radiation Oncology

## 2018-12-29 ENCOUNTER — Encounter: Payer: Self-pay | Admitting: Hematology

## 2018-12-29 ENCOUNTER — Ambulatory Visit: Payer: 59 | Attending: Surgery | Admitting: Physical Therapy

## 2018-12-29 ENCOUNTER — Encounter: Payer: Self-pay | Admitting: Physical Therapy

## 2018-12-29 ENCOUNTER — Encounter: Payer: Self-pay | Admitting: General Practice

## 2018-12-29 VITALS — BP 164/90 | HR 78 | Temp 97.8°F | Resp 17 | Ht 63.0 in | Wt 171.0 lb

## 2018-12-29 DIAGNOSIS — Z170 Estrogen receptor positive status [ER+]: Secondary | ICD-10-CM | POA: Diagnosis not present

## 2018-12-29 DIAGNOSIS — Z87891 Personal history of nicotine dependence: Secondary | ICD-10-CM

## 2018-12-29 DIAGNOSIS — Z800 Family history of malignant neoplasm of digestive organs: Secondary | ICD-10-CM | POA: Insufficient documentation

## 2018-12-29 DIAGNOSIS — C50411 Malignant neoplasm of upper-outer quadrant of right female breast: Secondary | ICD-10-CM

## 2018-12-29 DIAGNOSIS — R293 Abnormal posture: Secondary | ICD-10-CM | POA: Insufficient documentation

## 2018-12-29 DIAGNOSIS — Z8049 Family history of malignant neoplasm of other genital organs: Secondary | ICD-10-CM

## 2018-12-29 DIAGNOSIS — Z803 Family history of malignant neoplasm of breast: Secondary | ICD-10-CM | POA: Diagnosis not present

## 2018-12-29 DIAGNOSIS — R200 Anesthesia of skin: Secondary | ICD-10-CM

## 2018-12-29 DIAGNOSIS — C50911 Malignant neoplasm of unspecified site of right female breast: Secondary | ICD-10-CM

## 2018-12-29 LAB — CBC WITH DIFFERENTIAL (CANCER CENTER ONLY)
Abs Immature Granulocytes: 0.02 10*3/uL (ref 0.00–0.07)
Basophils Absolute: 0 10*3/uL (ref 0.0–0.1)
Basophils Relative: 1 %
Eosinophils Absolute: 0.1 10*3/uL (ref 0.0–0.5)
Eosinophils Relative: 2 %
HCT: 44 % (ref 36.0–46.0)
Hemoglobin: 14.8 g/dL (ref 12.0–15.0)
Immature Granulocytes: 0 %
Lymphocytes Relative: 24 %
Lymphs Abs: 1.5 10*3/uL (ref 0.7–4.0)
MCH: 31.1 pg (ref 26.0–34.0)
MCHC: 33.6 g/dL (ref 30.0–36.0)
MCV: 92.4 fL (ref 80.0–100.0)
Monocytes Absolute: 0.7 10*3/uL (ref 0.1–1.0)
Monocytes Relative: 11 %
Neutro Abs: 3.7 10*3/uL (ref 1.7–7.7)
Neutrophils Relative %: 62 %
Platelet Count: 208 10*3/uL (ref 150–400)
RBC: 4.76 MIL/uL (ref 3.87–5.11)
RDW: 12.3 % (ref 11.5–15.5)
WBC Count: 6 10*3/uL (ref 4.0–10.5)
nRBC: 0 % (ref 0.0–0.2)

## 2018-12-29 LAB — CMP (CANCER CENTER ONLY)
ALT: 21 U/L (ref 0–44)
AST: 16 U/L (ref 15–41)
Albumin: 4.2 g/dL (ref 3.5–5.0)
Alkaline Phosphatase: 71 U/L (ref 38–126)
Anion gap: 9 (ref 5–15)
BUN: 19 mg/dL (ref 6–20)
CO2: 27 mmol/L (ref 22–32)
Calcium: 9.4 mg/dL (ref 8.9–10.3)
Chloride: 107 mmol/L (ref 98–111)
Creatinine: 0.97 mg/dL (ref 0.44–1.00)
GFR, Est AFR Am: >60 mL/min (ref >60)
GFR, Estimated: >60 mL/min (ref >60)
Glucose, Bld: 127 mg/dL — ABNORMAL HIGH (ref 70–99)
Potassium: 4 mmol/L (ref 3.5–5.1)
Sodium: 143 mmol/L (ref 135–145)
Total Bilirubin: 0.3 mg/dL (ref 0.3–1.2)
Total Protein: 6.9 g/dL (ref 6.5–8.1)

## 2018-12-29 NOTE — H&P (View-Only) (Signed)
Alicia Hahn Documented: 12/29/2018 7:25 AM Location: [facility] Patient #: [id number] DOB: January 22, 1960 Undefined / Language: Alicia Hahn / Race: White Female  History of Present Illness Alicia Hahn A. 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 Microscopic Comment The carcinoma appears grade 2 and measures 7 mm in greatest linear extent. Prognostic makers will be ordered. Dr. Jeannie Hahn has reviewed the case. The case was called to [facility] on 12/24/2018. Alicia Males MD Pathologist, Electronic Signature (Case signed 12/24/2018) Specimen Gross and Clinical Information Specimen Comment TIF - 2:15 PM; extracted < 1 minute; 0.9cm right breast mass           CLINICAL DATA: Screening recall for possible right breast masses, with 1 of masses containing calcifications.  EXAM: DIGITAL DIAGNOSTIC RIGHT MAMMOGRAM WITH CAD AND TOMO  ULTRASOUND RIGHT BREAST  COMPARISON: Previous exam(s).  ACR Breast Density Category b: There are scattered areas of fibroglandular density.  FINDINGS: Possible mass noted in the upper outer aspect of the right breast persists on the diagnostic spot-compression images. This appears as an irregular 8-9 mm mass with associated architectural distortion and pleomorphic calcifications.  The possible mass noted in the inferior aspect the right breast on the screening MLO view partly disperses. There is a questionable partly defined margin on the spot-compression  MLO view.  Mammographic images were processed with CAD.  On physical exam, no discrete mass is palpated in the inferior or upper outer right breast.  Targeted ultrasound is performed, showing a hypoechoic irregular mass in the right breast at 11 o'clock, 2 cm the nipple, containing echogenic foci consistent with calcifications. The mass measures 9 x 8 x 9 mm, corresponding in size, shape and location to the mammographic mass.  Sonographic evaluation of the inferior aspect of the right breast shows no masses or abnormalities. There is no sonographic correlate to the questionable mammographic mass.  Sonographic evaluation of the right axilla shows no enlarged or abnormal lymph nodes.  IMPRESSION: 1. 9 mm mass, with associated calcifications and architectural distortion, in the 11 o'clock position of the right breast, highly suspicious for breast carcinoma. 2. Questionable mass noted in the inferior aspect of the right breast remains equivocal on diagnostic imaging may reflect focal fibroglandular tissue. There is no sonographic correlate.  RECOMMENDATION: 1. Ultrasound-guided core needle biopsy of the 11 o'clock position, 9 mm mass. If pathology reveals carcinoma, as expected, then consider staging breast MRI, in part to further assess the questionable lesion in the inferior aspect of the right breast.  I have discussed the findings and recommendations with the patient. If applicable, a reminder letter will be sent to the patient regarding the next appointment.  BI-RADS CATEGORY 5: Highly suggestive of malignancy.   Electronically Signed By: Alicia Hahn M.D. On: 12/22/2018 14:53.  The patient is a 58 year old female.   Past Surgical History Alicia Pummel, RN; 12/29/2018 7:25 AM) Breast Augmentation Bilateral.  Diagnostic Studies History Alicia Pummel, RN; 12/29/2018 7:25 AM) Colonoscopy never Mammogram within last  year Pap Smear 1-5 years  ago  Medication History Alicia Pummel, RN; 12/29/2018 7:25 AM) Medications Reconciled  Social History Alicia Pummel, RN; 12/29/2018 7:25 AM) Alcohol use Occasional alcohol use. Caffeine use Carbonated beverages, Coffee. No drug use Tobacco use Former smoker.  Family History Alicia Pummel, RN; 12/29/2018 7:25 AM) Alcohol Abuse Family Members In General. Breast Cancer Family Members In General, Mother, Sister. Cerebrovascular Accident Family Members In [place] Members In General. Diabetes Mellitus Father, Mother. Heart Disease Mother. Hypertension Mother. Prostate Cancer Father. Respiratory Condition Mother.  Pregnancy / Birth History Alicia Pummel, RN; 12/29/2018 7:25 AM) Age at menarche 42 years. Contraceptive History Intrauterine device, Oral contraceptives. Gravida 2 Length (months) of breastfeeding 3-6 Maternal age 33-25 Para 2 Regular periods  Other Problems Alicia Pummel, RN; 12/29/2018 7:25 AM) Bladder Problems Gastroesophageal Reflux Disease Heart murmur Hypercholesterolemia Kidney Stone     Review of Systems Alicia Spillers Ledford RN; 12/29/2018 7:25 AM) General Not Present- Appetite Loss, Chills, Fatigue, Fever, Night Sweats, Weight Gain and Weight Loss. Skin Not Present- Change in Wart/Mole, Dryness, Hives, Jaundice, New Lesions, Non-Healing Wounds, Rash and Ulcer. HEENT Present- Wears glasses/contact lenses. Not Present- Earache, Hearing Loss, Hoarseness, Nose Bleed, Oral Ulcers, Ringing in the Ears, Seasonal Allergies, Sinus Pain, Sore Throat, Visual Disturbances and Yellow Eyes. Respiratory Not Present- Bloody sputum, Chronic Cough, Difficulty Breathing, Snoring and Wheezing. Breast Not Present- Breast Mass, Breast Pain, Nipple Discharge and Skin Changes. Cardiovascular Present- Leg Cramps. Not Present- Chest Pain, Difficulty Breathing Lying Down, Palpitations, Rapid Heart Rate, Shortness of Breath and Swelling of  Extremities. Gastrointestinal Not Present- Abdominal Pain, Bloating, Bloody Stool, Change in Bowel Habits, Chronic diarrhea, Constipation, Difficulty Swallowing, Excessive gas, Gets full quickly at meals, Hemorrhoids, Indigestion, Nausea, Rectal Pain and Vomiting. Female Genitourinary Not Present- Frequency, Nocturia, Painful Urination, Pelvic Pain and Urgency. Musculoskeletal Present- Muscle Pain. Not Present- Back Pain, Joint Pain, Joint Stiffness, Muscle Weakness and Swelling of Extremities. Neurological Present- Numbness, Tingling and Weakness. Not Present- Decreased Memory, Fainting, Headaches, Seizures, Tremor and Trouble walking. Psychiatric Present- Change in Sleep Pattern. Not Present- Anxiety, Bipolar, Depression, Fearful and Frequent crying. Endocrine Present- Hot flashes. Not Present- Cold Intolerance, Excessive Hunger, Hair Changes, Heat Intolerance and New Diabetes. Hematology Present- Easy Bruising. Not Present- Blood Thinners, Excessive bleeding, Gland problems, HIV and Persistent Infections.   Physical Exam (Nikya Busler A. Vignesh Willert MD; 12/29/2018 3:29 PM)  General Mental Status-Alert. General Appearance-Consistent with stated age. Hydration-Well hydrated. Voice-Normal.  Chest and Lung Exam Note: WOB normal No SOB  Breast Breast - Left-Symmetric, Non Tender, No Biopsy scars, no Dimpling - Left, No Inflammation, No Lumpectomy scars, No Mastectomy scars, No Peau d' Orange. Breast - Right-Symmetric, Non Tender, No Biopsy scars, no Dimpling - Right, No Inflammation, No Lumpectomy scars, No Mastectomy scars, No Peau d' Orange. Breast Lump-No Palpable Breast Mass.  Cardiovascular Note: NSR  Neurologic Neurologic evaluation reveals -alert and oriented x 3 with no impairment of recent or remote memory. Mental Status-Normal.  Musculoskeletal Normal Exam - Left-Upper Extremity Strength Normal and Lower Extremity Strength Normal. Normal Exam - Right-Upper  Extremity Strength Normal and Lower Extremity Strength Normal.  Lymphatic Head & Neck  General Head & Neck Lymphatics: Bilateral - Description - Normal. Axillary  General Axillary Region: Bilateral - Description - Normal. Tenderness - Non Tender.    Assessment & Plan (Tyion Boylen A. Eamonn Sermeno MD; 12/29/2018 3:34 PM)  BREAST CANCER, RIGHT (C50.911) Impression: triple positive needs MRI to define second lesion Good breast conserving candidate needs port for  chemo  Opted for right breast lumpectomy and SLN mapping  port placement  Risk of lumpectomy include bleeding, infection, seroma, more surgery, use of seed/wire, wound care, cosmetic deformity and the need for other treatments, death , blood clots, death. Pt agrees to proceed. Risk of sentinel lymph node mapping include bleeding, infection, lymphedema, shoulder pain. stiffness, dye allergy. cosmetic deformity , blood clots, death, need for more surgery. Pt agrees to proceed.  Pt requires port placement for chemotherapy. Risk include bleeding, infection, pneumothorax, hemothorax, mediastinal injury, nerve injury , blood vessel injury, stroke, blood clots, death, migration. embolization and need for additional procedures. Pt agrees to proceed.  Current Plans You are being scheduled for surgery- Our schedulers will call you.  You should hear from our office's scheduling department within 5 working days about the location, date, and time of surgery. We try to make accommodations for patient's preferences in scheduling surgery, but sometimes the OR schedule or the surgeon's schedule prevents [place] from making those accommodations.  If you have not heard from our office [phone]) in 5 working days, call the office and ask for your surgeon's nurse.  If you have other questions about your diagnosis, plan, or surgery, call the office and ask for your surgeon's nurse.  Pt Education - [facility] Breast Cancer Information Given - [facility] "Breast  Journey" Package We discussed the staging and pathophysiology of breast cancer. We discussed all of the different options for treatment for breast cancer including surgery, chemotherapy, radiation therapy, Herceptin, and antiestrogen therapy. We discussed a sentinel lymph node biopsy as she does not appear to having lymph node involvement right now. We discussed the performance of that with injection of radioactive tracer and blue dye. We discussed that she would have an incision underneath her axillary hairline. We discussed that there is a bout a 10-20% chance of having a positive node with a sentinel lymph node biopsy and we will await the permanent pathology to make any other first further decisions in terms of her treatment. One of these options might be to return to the operating room to perform an axillary lymph node dissection. We discussed about a 1-2% risk lifetime of chronic shoulder pain as well as lymphedema associated with a sentinel lymph node biopsy. We discussed the options for treatment of the breast cancer which included lumpectomy versus a mastectomy. We discussed the performance of the lumpectomy with a wire placement. We discussed a 10-20% chance of a positive margin requiring reexcision in the operating room. We also discussed that she may need radiation therapy or antiestrogen therapy or both if she undergoes lumpectomy. We discussed the mastectomy and the postoperative care for that as well. We discussed that there is no difference in her survival whether she undergoes lumpectomy with radiation therapy or antiestrogen therapy versus a mastectomy. There is a slight difference in the local recurrence rate being 3-5% with lumpectomy and about 1% with a mastectomy. We discussed the risks of operation including bleeding, infection, possible reoperation. She understands her further therapy will be based on what her stages at the time of her operation.  Pt Education - [facility] breast cancer  surgery: discussed with patient and provided information. Use of a central venous catheter for intravenous therapy was discussed. Technique of catheter placement using ultrasound and fluoroscopy guidance was discussed. Risks such as bleeding, infection, pneumothorax, catheter occlusion, reoperation, and other risks were discussed. I noted a good likelihood this will help address the problem. Questions were answered. The patient expressed understanding &  wishes to proceed.

## 2018-12-29 NOTE — Therapy (Signed)
[name], [place], [address] Phone: [phone]   Fax:  [phone]  Physical Therapy Evaluation  Patient Details  Name: Alicia Hahn MRN: [id number] Date of Birth: 1960-04-14 Referring Provider (PT): Dr. Erroll Luna   Encounter Date: 12/29/2018  PT End of Session - 12/29/18 1346    Visit Number  1    Number of Visits  2    Date for PT Re-Evaluation  02/23/19    PT Start Time  1512    PT Stop Time  1542    PT Time Calculation (min)  30 min    Activity Tolerance  Patient tolerated treatment well    Behavior During Therapy  [facility] [name] for tasks assessed/performed       History reviewed. No pertinent past medical history.  Past Surgical History:  Procedure Laterality Date  . COSMETIC SURGERY Bilateral 2000   breast aug/ lipo    There were no vitals filed for this visit.   Subjective Assessment - 12/29/18 1331    Subjective  Patient reports she is here today to be seen by her medical team for her newly diagnosed right breast cancer. She also has left arm nerve pain x2 months for unknown reasons.    Pertinent History  Patient was diagnosed on 12/20/2018 with right triple positive breast cancer. It measures 9 mm and is located in the upper outer quadrant. She has a history of bilateral retropectoral implants placed in 2000.    Patient Stated Goals  Reduce lymphedema risk and learn post op shoulder ROM HEP    Currently in Pain?  Yes    Pain Score  8     Pain Location  Arm    Pain Orientation  Left    Pain Descriptors / Indicators  Shooting;Sharp;Other (Comment)   Zinging   Pain Type  Neuropathic pain    Pain Radiating Towards  Left fingers    Pain Onset  More than a month ago    Pain Frequency  Intermittent    Aggravating Factors   Reaching left arm out to the side (abduction)    Pain Relieving Factors  Not moving arm    Multiple Pain Sites  No         [facility] PT Assessment - 12/29/18  0001      Assessment   Medical Diagnosis  Right breast cancer    Referring Provider (PT)  Dr. Marcello Moores Cornett    Onset Date/Surgical Date  12/20/18    Hand Dominance  Right    Prior Therapy  none      Precautions   Precautions  Other (comment)    Precaution Comments  active cancer      Restrictions   Weight Bearing Restrictions  No      Balance Screen   Has the patient fallen in the past 6 months  No    Has the patient had a decrease in activity level because of a fear of falling?   No    Is the patient reluctant to leave their home because of a fear of falling?   No      Home Film/video editor residence    Living Arrangements  Spouse/significant other    Available Help at Discharge  Family      Prior Function   Level of Independence  Independent    Vocation  Retired    Biomedical scientist  Retired Therapist, sports    Leisure  She does not exercise      Cognition   Overall Cognitive Status  Within Functional Limits for tasks assessed      Observation/Other Assessments   Observations  Positive left upper limb tension test with median nerve pain      Posture/Postural Control   Posture/Postural Control  Postural limitations    Postural Limitations  Forward head;Rounded Shoulders      ROM / Strength   AROM / PROM / Strength  AROM;Strength      AROM   AROM Assessment Site  Shoulder;Cervical    Right/Left Shoulder  Right;Left    Right Shoulder Extension  46 Degrees    Right Shoulder Flexion  144 Degrees    Right Shoulder ABduction  148 Degrees    Right Shoulder Internal Rotation  71 Degrees    Right Shoulder External Rotation  83 Degrees    Left Shoulder Extension  43 Degrees    Left Shoulder Flexion  144 Degrees    Left Shoulder ABduction  147 Degrees    Left Shoulder Internal Rotation  78 Degrees    Left Shoulder External Rotation  90 Degrees    Cervical Flexion  WNL    Cervical Extension  WNL    Cervical - Right Side Bend  WNL    Cervical - Left  Side Bend  WNL    Cervical - Right Rotation  WNL    Cervical - Left Rotation  WNL      Strength   Overall Strength  Within functional limits for tasks performed      Palpation   Spinal mobility  Decreased mobility in cervical vertebrae with PA mobs    Palpation comment  Palpable tightness present in cervical region but no reproduction of pain in left arm with palpation to cervical spine        LYMPHEDEMA/ONCOLOGY QUESTIONNAIRE - 12/29/18 1342      Type   Cancer Type  Right breast cancer      Lymphedema Assessments   Lymphedema Assessments  Upper extremities      Right Upper Extremity Lymphedema   10 cm Proximal to Olecranon Process  29.6 cm    Olecranon Process  24.8 cm    10 cm Proximal to Ulnar Styloid Process  22.6 cm    Just Proximal to Ulnar Styloid Process  15 cm    Across Hand at [employer]  17.9 cm    At [place] of 2nd Digit  6.3 cm      Left Upper Extremity Lymphedema   10 cm Proximal to Olecranon Process  30.1 cm    Olecranon Process  24.9 cm    10 cm Proximal to Ulnar Styloid Process  21.7 cm    Just Proximal to Ulnar Styloid Process  15 cm    Across Hand at [employer]  17.6 cm    At [place] of 2nd Digit  6.1 cm          Quick Dash - 12/29/18 0001    Open a tight or new jar  No difficulty    Do heavy household chores (wash walls, wash floors)  No difficulty    Carry a shopping bag or briefcase  No difficulty    Wash your back  No difficulty    Use a knife to cut food  No difficulty    Recreational activities in which you take some force or impact through your arm, shoulder, or hand (golf, hammering, tennis)  No  difficulty    During the past week, to what extent has your arm, shoulder or hand problem interfered with your normal social activities with family, friends, neighbors, or groups?  Not at all    During the past week, to what extent has your arm, shoulder or hand problem limited your work or other regular daily activities  Not at all    Arm,  shoulder, or hand pain.  None    Tingling (pins and needles) in your arm, shoulder, or hand  None    Difficulty Sleeping  No difficulty    DASH Score  0 %        Objective measurements completed on examination: See above findings.       Patient was instructed today in a home exercise program today for post op shoulder range of motion. These included active assist shoulder flexion in sitting, scapular retraction, wall walking with shoulder abduction, and hands behind head external rotation.  She was encouraged to do these twice a day, holding 3 seconds and repeating 5 times when permitted by her physician.           PT Education - 12/29/18 1343    Education Details  Lymphedema risk reduction and post op shoulder ROM HEP    Person(s) Educated  Patient    Methods  Explanation;Demonstration;Handout    Comprehension  Verbalized understanding;Returned demonstration          PT Long Term Goals - 12/29/18 1356      PT LONG TERM GOAL #1   Title  Patient will demonstrate she has regained full shoulder ROM and function post operatively compared to baselines.    Time  8    Period  Weeks    Status  New    Target Date  02/23/19      Breast Clinic Goals - 12/29/18 1356      Patient will be able to verbalize understanding of pertinent lymphedema risk reduction practices relevant to her diagnosis specifically related to skin care.   Time  1    Period  Days    Status  Achieved      Patient will be able to return demonstrate and/or verbalize understanding of the post-op home exercise program related to regaining shoulder range of motion.   Time  1    Period  Days    Status  Achieved      Patient will be able to verbalize understanding of the importance of attending the postoperative After Breast Cancer Class for further lymphedema risk reduction education and therapeutic exercise.   Time  1    Period  Days    Status  Achieved            Plan - 12/29/18 1347     Clinical Impression Statement  Patient was diagnosed on 12/20/2018 with right triple positive breast cancer. It measures 9 mm and is located in the upper outer quadrant. She has a history of bilateral retropectoral implants placed in 2000. Her multidisciplinary medical team met prior to her assessments to determine a recommended treatment plan. She is planning to have a right lumpectomy and sentinel node biopsy followed by chemotherapy, radiation, and anti-estrogen therapy. She will benefit from a post op PT reassessment to determine needs.    Stability/Clinical Decision Making  Stable/Uncomplicated    Clinical Decision Making  Low    Rehab Potential  Excellent    PT Frequency  --   Eval and 1 f/u visit   PT  Treatment/Interventions  ADLs/Self Care Home Management;Therapeutic exercise;Patient/family education    PT Next Visit Plan  Will reassess 3-4 weeks post op to determine needs    PT Home Exercise Plan  Post op shoulder ROM HEP    Recommended Other Services  Encouraged pt to consider seeing a sports medicine doctor to have left arm nerve pain assessed    Consulted and Agree with Plan of Care  Patient       Patient will benefit from skilled therapeutic intervention in order to improve the following deficits and impairments:  Postural dysfunction, Decreased knowledge of precautions, Pain, Impaired UE functional use, Decreased range of motion  Visit Diagnosis: Malignant neoplasm of upper-outer quadrant of right breast in female, estrogen receptor positive ([place]) - Plan: PT plan of care cert/re-cert  Abnormal posture - Plan: PT plan of care cert/re-cert   Patient will follow up at outpatient cancer rehab 3-4 weeks following surgery.  If the patient requires physical therapy at that time, a specific plan will be dictated and sent to the referring physician for approval. The patient was educated today on appropriate basic range of motion exercises to begin post operatively and the importance of  attending the After Breast Cancer class following surgery.  Patient was educated today on lymphedema risk reduction practices as it pertains to recommendations that will benefit the patient immediately following surgery.  She verbalized good understanding.      Problem List Patient Active Problem List   Diagnosis Date Noted  . Malignant neoplasm of upper-outer quadrant of right breast in female, estrogen receptor positive ([place]) 12/28/2018  . Family history of breast cancer in sister , and mother 11/02/2018  . Plantar fasciitis of left foot 11/02/2018  . Muscle ache of extremity-bilateral calf muscles 11/02/2018  . Impingement syndrome of right shoulder 05/11/2017   Annia Friendly, PT 12/29/18 3:53 PM  [place], [place], [address] Phone: [phone]   Fax:  [phone]  Name: Alicia Hahn MRN: [id number] Date of Birth: Oct 30, 1960

## 2018-12-29 NOTE — Progress Notes (Signed)
[name]   [phone] Fax:([phone]) [place] Note   Patient Care Team: Mellody Dance, DO as PCP - General (Family Medicine) Mcarthur Rossetti, MD as Consulting Physician (Orthopedic Surgery) Molli Posey, MD as Consulting Physician (Obstetrics and Gynecology) Erroll Luna, MD as Consulting Physician (General Surgery) Truitt Merle, MD as Consulting Physician (Hematology) Kyung Rudd, MD as Consulting Physician ([place]) Mauro Kaufmann, RN as Oncology Nurse Navigator Rockwell Germany, RN as Oncology Nurse Navigator  Date of Service:  12/29/2018   CHIEF COMPLAINTS/PURPOSE OF CONSULTATION:  Newly Diagnosed Malignant neoplasm of upper-outer quadrant of right breast    Oncology History Overview Note  Cancer Staging Malignant neoplasm of upper-outer quadrant of right breast in female, estrogen receptor positive ([place]) Staging form: Breast, AJCC 8th Edition - Clinical stage from 12/29/2018: Stage IA (cT1b, cN0, cM0, G2, ER+, PR+, HER2+) - Unsigned Cancer Staging No matching staging information was found for the patient.    Malignant neoplasm of upper-outer quadrant of right breast in female, estrogen receptor positive ([place])  12/22/2018 Mammogram    Diagnostic Mammogram 12/22/18  IMPRESSION: 1. 9 mm mass, with associated calcifications and architectural distortion, in the 11 o'clock position of the right breast, highly suspicious for breast carcinoma. 2. Questionable mass noted in the inferior aspect of the right breast remains equivocal on diagnostic imaging may reflect focal fibroglandular tissue. There is no sonographic correlate.   12/23/2018 Initial Biopsy   Diagnosis 12/23/18 Breast, right, needle core biopsy, 11 o'clock position - INVASIVE DUCTAL CARCINOMA, SEE COMMENT. - DUCTAL CARCINOMA IN SITU WITH NECROSIS.   12/28/2018 Initial Diagnosis   Malignant neoplasm of upper-outer quadrant of right breast in  female, estrogen receptor positive ([place])      HISTORY OF PRESENTING ILLNESS:  Alicia Hahn 58 y.o. female is a here because of newly diagnosed right breast cancer. The patient presents to the Breast clinic today accompanied by her husband.   She notes her mass was found by Screening mammogram. She usually has mammograms yearly except in 2019. She did not feel her mass herself. She denies nay other skin or breast changes. She notes new pain or weight, appetite or energy changes. Today the patient notes she had left arm tingling or numbness with extension of shoulder. This is been going on for 2 months.   Socially she is married with 2 adult children. She only smoked for 4-5 years, less than half ppd and quit in 1980. She recently retired from being a Marine scientist at [employer].   She has no significant medical history. She had breast reconstruction with implants in the past.  Her father has prostate cancer diagnosed in mid 65s. Her mother had DCIS and her sister had breast cancer at 82. Her maternal aunt was diagnosed with breast cancer in her mid 62s. She notes her prior genetic panel was done at [facility] and was found to be BRCA was negative  She notes she has been on Mirena three times. She was started on this from transvaginal bleeding. Her hormonal level has not been checked and she has not gone through menopausal symptoms due to Mirena.    GYN HISTORY  Menarchal: 10 LMP: unknown - had Mirena IUD three times Contraceptive: 1977-1983, 1999-2005 HRT: Has been on for 7 years, stopped in 09/2018 G2P2: First at age 22    REVIEW OF SYSTEMS:    Constitutional: Denies fevers, chills or abnormal night sweats Eyes: Denies blurriness of vision, double  vision or watery eyes Ears, nose, mouth, throat, and face: Denies mucositis or sore throat Respiratory: Denies cough, dyspnea or wheezes Cardiovascular: Denies palpitation, chest discomfort or lower extremity swelling Gastrointestinal:   Denies nausea, heartburn or change in bowel habits Skin: Denies abnormal skin rashes Lymphatics: Denies new lymphadenopathy or easy bruising Neurological:Denies numbness, tingling or new weaknesses (+) Left arm tingling and neuropathy  Behavioral/Psych: Mood is stable, no new changes  All other systems were reviewed with the patient and are negative.   MEDICAL HISTORY:  History reviewed. No pertinent past medical history.  SURGICAL HISTORY: Past Surgical History:  Procedure Laterality Date   COSMETIC SURGERY Bilateral 2000   breast aug/ lipo    SOCIAL HISTORY: Social History   Socioeconomic History   Marital status: Married    Spouse name: Not on file   Number of children: 2   Years of education: Not on file   Highest education level: Not on file  Occupational History   Not on file  Social Needs   Financial resource strain: Not on file   Food insecurity    Worry: Not on file    Inability: Not on file   Transportation needs    Medical: Not on file    Non-medical: Not on file  Tobacco Use   Smoking status: Former Smoker    Packs/day: 0.25    Years: 5.00    Pack years: 1.25    Quit date: 01/20/1978    Years since quitting: 40.9   Smokeless tobacco: Never Used   Tobacco comment: 1982  Substance and Sexual Activity   Alcohol use: Yes    Alcohol/week: 6.0 standard drinks    Types: 6 Glasses of wine per week   Drug use: No   Sexual activity: Yes    Birth control/protection: None  Lifestyle   Physical activity    Days per week: Not on file    Minutes per session: Not on file   Stress: Not on file  Relationships   Social connections    Talks on phone: Not on file    Gets together: Not on file    Attends religious service: Not on file    Active member of club or organization: Not on file    Attends meetings of clubs or organizations: Not on file    Relationship status: Not on file   Intimate partner violence    Fear of current or ex partner:  Not on file    Emotionally abused: Not on file    Physically abused: Not on file    Forced sexual activity: Not on file  Other Topics Concern   Not on file  Social History Narrative   Not on file    FAMILY HISTORY: Family History  Problem Relation Age of Onset   Diabetes Mother    Heart disease Mother    Hyperlipidemia Mother    Breast cancer Mother 53   Cancer Mother        breast DCIS   Cancer Father 11       prostate cancer    Diabetes Father    Breast cancer Sister 48   Cancer Sister 38       breast cancer    Cancer Maternal Aunt 75       breast cancer    Cancer Maternal Grandfather        colon cancer    ALLERGIES:  has No Known Allergies.  MEDICATIONS:  Current Outpatient Medications  Medication Sig  Dispense Refill   CALCIUM PO Take 1,000 mg by mouth daily.     levonorgestrel (MIRENA) 20 MCG/24HR IUD 1 each by Intrauterine route once.     Magnesium 125 MG CAPS Take by mouth.     Melatonin 3 MG CAPS Take by mouth.     Multiple Vitamins-Minerals (MULTIVITAMIN WITH MINERALS) tablet Take 1 tablet by mouth daily.     VITAMIN D PO Take 12,000 Units by mouth daily.     estradiol (VIVELLE-DOT) 0.05 MG/24HR patch Place 0.5 mg onto the skin 2 (two) times a week.     No current facility-administered medications for this visit.     PHYSICAL EXAMINATION: ECOG PERFORMANCE STATUS: 0 - Asymptomatic  Vitals:   12/29/18 1254  BP: (!) 164/90  Pulse: 78  Resp: 17  Temp: 97.8 F (36.6 C)  SpO2: 100%   Filed Weights   12/29/18 1254  Weight: 171 lb (77.6 kg)    GENERAL:alert, no distress and comfortable SKIN: skin color, texture, turgor are normal, no rashes or significant lesions EYES: normal, Conjunctiva are pink and non-injected, sclera clear  NECK: supple, thyroid normal size, non-tender, without nodularity LYMPH:  no palpable lymphadenopathy in the cervical, axillary  LUNGS: clear to auscultation and percussion with normal breathing  effort HEART: regular rate & rhythm and no murmurs and no lower extremity edema ABDOMEN:abdomen soft, non-tender and normal bowel sounds Musculoskeletal:no cyanosis of digits and no clubbing  NEURO: alert & oriented x 3 with fluent speech, no focal motor/sensory deficits BREAST: (+) B/l breast implants in place. (+) Skin ecchymosis of right breast at biopsy site. No palpable mass, nodules or adenopathy bilaterally. Breast exam benign.  LABORATORY DATA:  I have reviewed the data as listed CBC Latest Ref Rng & Units 12/29/2018 09/21/2012  WBC 4.0 - 10.5 K/uL 6.0 10.2  Hemoglobin 12.0 - 15.0 g/dL 14.8 13.6  Hematocrit 36.0 - 46.0 % 44.0 42.2  Platelets 150 - 400 K/uL 208 -    CMP Latest Ref Rng & Units 12/29/2018  Glucose 70 - 99 mg/dL 127(H)  BUN 6 - 20 mg/dL 19  Creatinine 0.44 - 1.00 mg/dL 0.97  Sodium 135 - 145 mmol/L 143  Potassium 3.5 - 5.1 mmol/L 4.0  Chloride 98 - 111 mmol/L 107  CO2 22 - 32 mmol/L 27  Calcium 8.9 - 10.3 mg/dL 9.4  Total Protein 6.5 - 8.1 g/dL 6.9  Total Bilirubin 0.3 - 1.2 mg/dL 0.3  Alkaline Phos 38 - 126 U/L 71  AST 15 - 41 U/L 16  ALT 0 - 44 U/L 21     RADIOGRAPHIC STUDIES: I have personally reviewed the radiological images as listed and agreed with the findings in the report. US Breast Ltd Uni Right Inc Axilla  Result Date: 12/22/2018 CLINICAL DATA:  Screening recall for possible right breast masses, with 1 of masses containing calcifications. EXAM: DIGITAL DIAGNOSTIC RIGHT MAMMOGRAM WITH CAD AND TOMO ULTRASOUND RIGHT BREAST COMPARISON:  Previous exam(s). ACR Breast Density Category b: There are scattered areas of fibroglandular density. FINDINGS: Possible mass noted in the upper outer aspect of the right breast persists on the diagnostic spot-compression images. This appears as an irregular 8-9 mm mass with associated architectural distortion and pleomorphic calcifications. The possible mass noted in the inferior aspect the right breast on the screening  MLO view partly disperses. There is a questionable partly defined margin on the spot-compression MLO view. Mammographic images were processed with CAD. On physical exam, no discrete mass is palpated in the  inferior or upper outer right breast. Targeted ultrasound is performed, showing a hypoechoic irregular mass in the right breast at 11 o'clock, 2 cm the nipple, containing echogenic foci consistent with calcifications. The mass measures 9 x 8 x 9 mm, corresponding in size, shape and location to the mammographic mass. Sonographic evaluation of the inferior aspect of the right breast shows no masses or abnormalities. There is no sonographic correlate to the questionable mammographic mass. Sonographic evaluation of the right axilla shows no enlarged or abnormal lymph nodes. IMPRESSION: 1. 9 mm mass, with associated calcifications and architectural distortion, in the 11 o'clock position of the right breast, highly suspicious for breast carcinoma. 2. Questionable mass noted in the inferior aspect of the right breast remains equivocal on diagnostic imaging may reflect focal fibroglandular tissue. There is no sonographic correlate. RECOMMENDATION: 1. Ultrasound-guided core needle biopsy of the 11 o'clock position, 9 mm mass. If pathology reveals carcinoma, as expected, then consider staging breast MRI, in part to further assess the questionable lesion in the inferior aspect of the right breast. I have discussed the findings and recommendations with the patient. If applicable, a reminder letter will be sent to the patient regarding the next appointment. BI-RADS CATEGORY  5: Highly suggestive of malignancy. Electronically Signed   By: Lajean Manes M.D.   On: 12/22/2018 14:53   Mm Diag Breast Tomo Uni Right  Result Date: 12/22/2018 CLINICAL DATA:  Screening recall for possible right breast masses, with 1 of masses containing calcifications. EXAM: DIGITAL DIAGNOSTIC RIGHT MAMMOGRAM WITH CAD AND TOMO ULTRASOUND RIGHT  BREAST COMPARISON:  Previous exam(s). ACR Breast Density Category b: There are scattered areas of fibroglandular density. FINDINGS: Possible mass noted in the upper outer aspect of the right breast persists on the diagnostic spot-compression images. This appears as an irregular 8-9 mm mass with associated architectural distortion and pleomorphic calcifications. The possible mass noted in the inferior aspect the right breast on the screening MLO view partly disperses. There is a questionable partly defined margin on the spot-compression MLO view. Mammographic images were processed with CAD. On physical exam, no discrete mass is palpated in the inferior or upper outer right breast. Targeted ultrasound is performed, showing a hypoechoic irregular mass in the right breast at 11 o'clock, 2 cm the nipple, containing echogenic foci consistent with calcifications. The mass measures 9 x 8 x 9 mm, corresponding in size, shape and location to the mammographic mass. Sonographic evaluation of the inferior aspect of the right breast shows no masses or abnormalities. There is no sonographic correlate to the questionable mammographic mass. Sonographic evaluation of the right axilla shows no enlarged or abnormal lymph nodes. IMPRESSION: 1. 9 mm mass, with associated calcifications and architectural distortion, in the 11 o'clock position of the right breast, highly suspicious for breast carcinoma. 2. Questionable mass noted in the inferior aspect of the right breast remains equivocal on diagnostic imaging may reflect focal fibroglandular tissue. There is no sonographic correlate. RECOMMENDATION: 1. Ultrasound-guided core needle biopsy of the 11 o'clock position, 9 mm mass. If pathology reveals carcinoma, as expected, then consider staging breast MRI, in part to further assess the questionable lesion in the inferior aspect of the right breast. I have discussed the findings and recommendations with the patient. If applicable, a  reminder letter will be sent to the patient regarding the next appointment. BI-RADS CATEGORY  5: Highly suggestive of malignancy. Electronically Signed   By: Lajean Manes M.D.   On: 12/22/2018 14:53   Mm 3d  Screen Breast W/implant Bilateral  Result Date: 12/20/2018 CLINICAL DATA:  Screening. EXAM: DIGITAL SCREENING BILATERAL MAMMOGRAM WITH IMPLANTS, CAD AND TOMO The patient has retropectoral implants. Standard and implant displaced views were performed. COMPARISON:  Previous exam(s). ACR Breast Density Category b: There are scattered areas of fibroglandular density. FINDINGS: In the right breast, two possible masses, one with calcifications, warrant further evaluation. In the left breast, no findings suspicious for malignancy. Images were processed with CAD. IMPRESSION: Further evaluation is suggested for possible masses, one with calcifications, in the right breast. RECOMMENDATION: Diagnostic mammogram and possibly ultrasound of the right breast. (Code:[id number]) The patient will be contacted regarding the findings, and additional imaging will be scheduled. BI-RADS CATEGORY  0: Incomplete. Need additional imaging evaluation and/or prior mammograms for comparison. Electronically Signed   By: Claudie Revering M.D.   On: 12/20/2018 17:18   Mm Clip Placement Right  Addendum Date: 12/24/2018   ADDENDUM REPORT: 12/24/2018 13:26 ADDENDUM: Pathology revealed GRADE II INVASIVE DUCTAL CARCINOMA, DUCTAL CARCINOMA IN SITU WITH NECROSIS of the RIGHT breast, 11 o'clock position. This was found to be concordant by Dr. Hassan Rowan. Pathology results were discussed with the patient by telephone. The patient reported doing well after the biopsy with tenderness at the site. Post biopsy instructions and care were reviewed and questions were answered. The patient was encouraged to call The [facility] [place] for any additional concerns. Recommendation: Consider staging breast MRI, in part to further assess the  questionable lesion in the inferior aspect of the RIGHT breast. The patient was referred to [facility] on December 29, 2018. Pathology results reported by Stacie Acres, RN on 12/24/2018. Electronically Signed   By: Margarette Canada M.D.   On: 12/24/2018 13:26   Result Date: 12/24/2018 CLINICAL DATA:  58 year old female for tissue sampling of 0.9 cm UPPER-OUTER RIGHT breast mass. EXAM: ULTRASOUND GUIDED RIGHT BREAST CORE NEEDLE BIOPSY 3D RIGHT MAMMOGRAM POST ULTRASOUND BIOPSY COMPARISON:  Previous exam(s). FINDINGS: I met with the patient and we discussed the procedure of ultrasound-guided biopsy, including benefits and alternatives. We discussed the high likelihood of a successful procedure. We discussed the risks of the procedure, including infection, bleeding, tissue injury, clip migration, and inadequate sampling. Informed written consent was given. The usual time-out protocol was performed immediately prior to the procedure. Lesion quadrant: UPPER-OUTER RIGHT breast Using sterile technique and 1% Lidocaine as local anesthetic, under direct ultrasound visualization, a 12 gauge spring-loaded device was used to perform biopsy of the 0.9 cm mass at the 11 o'clock position of the RIGHT breast 2 cm from the nipple using a LATERAL approach. At the conclusion of the procedure RIBBON tissue marker clip was deployed into the biopsy cavity. Follow up 2 view mammogram was performed demonstrating the RIBBON clip to be in satisfactory position at the expected site of biopsy within the UPPER-OUTER RIGHT breast and corresponding to the mammographic mass. IMPRESSION: Ultrasound guided biopsy of 0.9 cm UPPER-OUTER RIGHT breast mass. No apparent complications. Satisfactory position of RIBBON clip following ultrasound-guided RIGHT breast biopsy. Electronically Signed: By: Margarette Canada M.D. On: 12/23/2018 15:00   [place] Rt Breast Bx W Loc Dev 1st Lesion Img Bx Spec  US Guide  Addendum Date: 12/24/2018   ADDENDUM REPORT: 12/24/2018 13:26 ADDENDUM: Pathology revealed GRADE II INVASIVE DUCTAL CARCINOMA, DUCTAL CARCINOMA IN SITU WITH NECROSIS of the RIGHT breast, 11 o'clock position. This was found to be concordant by Dr. Hassan Rowan. Pathology results  were discussed with the patient by telephone. The patient reported doing well after the biopsy with tenderness at the site. Post biopsy instructions and care were reviewed and questions were answered. The patient was encouraged to call [facility] for any additional concerns. Recommendation: Consider staging breast MRI, in part to further assess the questionable lesion in the inferior aspect of the RIGHT breast. The patient was referred to [facility] on December 29, 2018. Pathology results reported by Stacie Acres, RN on 12/24/2018. Electronically Signed   By: Margarette Canada M.D.   On: 12/24/2018 13:26   Result Date: 12/24/2018 CLINICAL DATA:  58 year old female for tissue sampling of 0.9 cm UPPER-OUTER RIGHT breast mass. EXAM: ULTRASOUND GUIDED RIGHT BREAST CORE NEEDLE BIOPSY 3D RIGHT MAMMOGRAM POST ULTRASOUND BIOPSY COMPARISON:  Previous exam(s). FINDINGS: I met with the patient and we discussed the procedure of ultrasound-guided biopsy, including benefits and alternatives. We discussed the high likelihood of a successful procedure. We discussed the risks of the procedure, including infection, bleeding, tissue injury, clip migration, and inadequate sampling. Informed written consent was given. The usual time-out protocol was performed immediately prior to the procedure. Lesion quadrant: UPPER-OUTER RIGHT breast Using sterile technique and 1% Lidocaine as local anesthetic, under direct ultrasound visualization, a 12 gauge spring-loaded device was used to perform biopsy of the 0.9 cm mass at the 11 o'clock position of the RIGHT breast 2  cm from the nipple using a LATERAL approach. At the conclusion of the procedure RIBBON tissue marker clip was deployed into the biopsy cavity. Follow up 2 view mammogram was performed demonstrating the RIBBON clip to be in satisfactory position at the expected site of biopsy within the UPPER-OUTER RIGHT breast and corresponding to the mammographic mass. IMPRESSION: Ultrasound guided biopsy of 0.9 cm UPPER-OUTER RIGHT breast mass. No apparent complications. Satisfactory position of RIBBON clip following ultrasound-guided RIGHT breast biopsy. Electronically Signed: By: Margarette Canada M.D. On: 12/23/2018 15:00    ASSESSMENT & PLAN:  Alicia Hahn is a 58 y.o. caucasian female with no significant past medical history.   1. Malignant neoplasm of upper-outer quadrant of right breast, invasive ductal carcinoma, [id number], stage IA, Triple Positive, Grade II -We discussed her image findings and the biopsy results in great details with patient and her husband. She has 55m mass of right breast with invasive ductal carcinoma and components of DCIS.  -Given the early stage disease, she is a candidate for Lumpectomy and SLN biopsy. She is agreeable with that. She was seen by Dr. CBrantley Stagetoday and likely will proceed with surgery soon.  -The risk of recurrence depends on the stage and biology of the tumor. She is early stage, with ER/PR and HER2 positive markers. I discussed HER2 positive disease is more aggressive leading to a high risk of cancer recurrence after surgery.  -To reduce recurrence adjuvant chemotherapy, radiation and antiestrogen therapy is recommended.  -I discussed the role of adjuvant chemo and anti-HER-2 therapy.  Given her small tumor, I recommend weekly Taxol for 12 weeks, and Herceptin every 3 weeks for 1 year.  If her surgical pathology reveals tumor 2 cm or larger, or palpable nodes, I would recommend more intensive chemo with TCHP.  If her tumor found to be smaller than 553mand node negative  she would no need adjuvant chemo but this is unlikely.   --Chemotherapy consent: Side effects including but does not limited to, fatigue, nausea, vomiting,  diarrhea, hair loss, neuropathy, fluid retention, renal and kidney dysfunction, neutropenic fever, needed for blood transfusion, bleeding, were discussed with patient in great detail. She is interested.  -I discussed option of Dignicap to help reduce hair loss. I also discussed monitoring heart function on Herceptin with repeated Echos.  -She was also seen by radiation oncologist Dr. Lisbeth Renshaw today. If her surgical sentinel lymph nodes were negative, she would not need post mastectomy radiation. Otherwise radiation is recommended to reduce the risk for local recurrence.  -Given the strong ER and PR expression, I recommend adjuvant endocrine therapy with aromatase inhibitor for a total of 5-10 years to reduce the risk of cancer recurrence. Potential benefits and side effects were discussed with patient and she is interested. Will test her hormonal level later to confirm she is postmenopausal.  -Given her questionable second breast mass, will obtain breast MRI to rule out multifocal or b/l disease. She is agreeable. -Labs reviewed with patient. Physical exam unremarkable.  -I encouraged her to have healthy diet and exercise regularly. She will proceed with surgery and I will f/u with her afterward to discuss surgery.    2. Estrogen replacement and postmenopausal vaginal bleeding -Started after 2nd Mirena was taken out. Her Gyn put her on a third Mirena and bleeding stopped.  -She was estrogen replacement for several years for hot flashes.  By her GYN. -I discussed given her age she is likely postmenopausal and her given ER/PR positive breast cancer I recommend she take her Mirena out. If she has further vaginal bleeding she should undergo workup for this with her Gyn.   3. Genetics  -Based on family history her father has prostate cancer and she had  significant family history of breast cancer. She is eligible for genetic testing.  -Pt notes she genetic testing before at [facility]. Will obtain records.     PLAN:  -Breast MRI in 1-2 weeks  -Proceed with surgery, pt agree with a port placement -F/u 2-3 weeks after surgery  Orders Placed This Encounter  Procedures   ECHOCARDIOGRAM COMPLETE    Standing Status:   Future    Standing Expiration Date:   03/28/2020    Order Specific Question:   Where should this test be performed    Answer:   [place]    Order Specific Question:   Perflutren DEFINITY (image enhancing agent) should be administered unless hypersensitivity or allergy exist    Answer:   Administer Perflutren    Order Specific Question:   Reason for exam-Echo    Answer:   Chemo  V67.2 / Z09    All questions were answered. The patient knows to call the clinic with any problems, questions or concerns. I spent 40 minutes counseling the patient face to face. The total time spent in the appointment was 45 minutes and more than 50% was on counseling.     Truitt Merle, MD 12/29/2018 5:17 PM  I, Joslyn Devon, am acting as scribe for Truitt Merle, MD.   I have reviewed the above documentation for accuracy and completeness, and I agree with the above.

## 2018-12-29 NOTE — Patient Instructions (Signed)

## 2018-12-29 NOTE — Progress Notes (Signed)
Radiation Oncology         ([phone]) [phone] ________________________________  Name: Alicia Hahn        MRN: [id number]  Date of Service: 12/29/2018 DOB: [birth year]  BJ:[id number], Neoma Laming, DO  Erroll Luna, MD     REFERRING PHYSICIAN: Erroll Luna, MD   DIAGNOSIS: The encounter diagnosis was Malignant neoplasm of upper-outer quadrant of right breast in female, estrogen receptor positive ([place]).   HISTORY OF PRESENT ILLNESS: Alicia Hahn is a 58 y.o. female seen in the multidisciplinary breast clinic for a new diagnosis of right breast cancer. The patient was noted to have screening detected mass in the right breast. She proceeded with diagnostic imaging and this revealed a 9 mm mass at the 11 o'clock position by ultrasound.  Her axilla was negative.  While there was not an ultrasound correlate, diagnostic mammography also showed concerns for possible second mass.  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  Her tumor was triple positive with a Ki-67 of 20%.    PREVIOUS RADIATION THERAPY: No   PAST MEDICAL HISTORY: No past medical history on file.     PAST SURGICAL HISTORY: Past Surgical History:  Procedure Laterality Date   COSMETIC SURGERY Bilateral 2000   breast aug/ lipo     FAMILY HISTORY:  Family History  Problem Relation Age of Onset   Diabetes Mother    Heart disease Mother    Hyperlipidemia Mother    Breast cancer Mother 56   Cancer Mother        breast DCIS   Cancer Father 38       prostate cancer    Diabetes Father    Breast cancer Sister 63   Cancer Sister 63       breast cancer    Cancer Maternal Aunt 55       breast cancer    Cancer Maternal Grandfather        colon cancer     SOCIAL HISTORY:  reports that she quit smoking about 40 years ago. She has a 1.25 pack-year smoking history. She has never used smokeless tobacco. She reports current  alcohol use of about 6.0 standard drinks of alcohol per week. She reports that she does not use drugs.  The patient is married and accompanied by her husband.  She is a retired [employer] scientist.  She states that she retired in January 2020, but worked many years on mother[employer] and [employer].  Fortunately before the pandemic they were able to enjoy traveling.   ALLERGIES: Patient has no known allergies.   MEDICATIONS:  Current Outpatient Medications  Medication Sig Dispense Refill   CALCIUM PO Take 1,000 mg by mouth daily.     estradiol (VIVELLE-DOT) 0.05 MG/24HR patch Place 0.5 mg onto the skin 2 (two) times a week.     levonorgestrel (MIRENA) 20 MCG/24HR IUD 1 each by Intrauterine route once.     Magnesium 125 MG CAPS Take by mouth.     Melatonin 3 MG CAPS Take by mouth.     Multiple Vitamins-Minerals (MULTIVITAMIN WITH MINERALS) tablet Take 1 tablet by mouth daily.     VITAMIN D PO Take 12,000 Units by mouth daily.     No current facility-administered medications for this encounter.     REVIEW OF SYSTEMS: On review of systems, the patient reports that she is doing well overall. She denies any chest pain, shortness of breath,  cough, fevers, chills, night sweats, unintended weight changes. She denies any bowel or bladder disturbances, and denies abdominal pain, nausea or vomiting. She denies any new musculoskeletal or joint aches or pains. A complete review of systems is obtained and is otherwise negative.     PHYSICAL EXAM:  Wt Readings from Last 3 Encounters:  12/29/18 171 lb (77.6 kg)  11/02/18 172 lb 6.4 oz (78.2 kg)  11/07/13 137 lb (62.1 kg)   Temp Readings from Last 3 Encounters:  12/29/18 97.8 F (36.6 C) (Temporal)  11/07/13 98.1 F (36.7 C) (Oral)  09/21/12 98.2 F (36.8 C) (Oral)   BP Readings from Last 3 Encounters:  12/29/18 (!) 164/90  11/02/18 125/81  11/07/13 124/80   Pulse Readings from Last 3 Encounters:  12/29/18 78  11/02/18 78  11/07/13 69     In general this is a well appearing Caucasian female in no acute distress.  She's alert and oriented x4 and appropriate throughout the examination. Cardiopulmonary assessment is negative for acute distress and she exhibits normal effort.  Bilateral breast exam is deferred. Marland Kitchen   ECOG = 0  0 - Asymptomatic (Fully active, able to carry on all predisease activities without restriction)  1 - Symptomatic but completely ambulatory (Restricted in physically strenuous activity but ambulatory and able to carry out work of a light or sedentary nature. For example, light housework, office work)  2 - Symptomatic, <50% in bed during the day (Ambulatory and capable of all self care but unable to carry out any work activities. Up and about more than 50% of waking hours)  3 - Symptomatic, >50% in bed, but not bedbound (Capable of only limited self-care, confined to bed or chair 50% or more of waking hours)  4 - Bedbound (Completely disabled. Cannot carry on any self-care. Totally confined to bed or chair)  5 - Death   Eustace Pen MM, Creech RH, Tormey DC, et al. [phone]). "Toxicity and response criteria of the [facility]". [place]. 5 (6): 649-55    LABORATORY DATA:  Lab Results  Component Value Date   WBC 6.0 12/29/2018   HGB 14.8 12/29/2018   HCT 44.0 12/29/2018   MCV 92.4 12/29/2018   PLT 208 12/29/2018   Lab Results  Component Value Date   NA 143 12/29/2018   K 4.0 12/29/2018   CL 107 12/29/2018   CO2 27 12/29/2018   Lab Results  Component Value Date   ALT 21 12/29/2018   AST 16 12/29/2018   ALKPHOS 71 12/29/2018   BILITOT 0.3 12/29/2018      RADIOGRAPHY: US BREAST LTD UNI RIGHT INC AXILLA  Result Date: 12/22/2018 CLINICAL DATA:  Screening recall for possible right breast masses, with 1 of masses containing calcifications. EXAM: DIGITAL DIAGNOSTIC RIGHT MAMMOGRAM WITH CAD AND TOMO ULTRASOUND RIGHT BREAST COMPARISON:  Previous exam(s). ACR Breast  Density Category b: There are scattered areas of fibroglandular density. FINDINGS: Possible mass noted in the upper outer aspect of the right breast persists on the diagnostic spot-compression images. This appears as an irregular 8-9 mm mass with associated architectural distortion and pleomorphic calcifications. The possible mass noted in the inferior aspect the right breast on the screening MLO view partly disperses. There is a questionable partly defined margin on the spot-compression MLO view. Mammographic images were processed with CAD. On physical exam, no discrete mass is palpated in the inferior or upper outer right breast. Targeted ultrasound is performed, showing a hypoechoic irregular mass in the  right breast at 11 o'clock, 2 cm the nipple, containing echogenic foci consistent with calcifications. The mass measures 9 x 8 x 9 mm, corresponding in size, shape and location to the mammographic mass. Sonographic evaluation of the inferior aspect of the right breast shows no masses or abnormalities. There is no sonographic correlate to the questionable mammographic mass. Sonographic evaluation of the right axilla shows no enlarged or abnormal lymph nodes. IMPRESSION: 1. 9 mm mass, with associated calcifications and architectural distortion, in the 11 o'clock position of the right breast, highly suspicious for breast carcinoma. 2. Questionable mass noted in the inferior aspect of the right breast remains equivocal on diagnostic imaging may reflect focal fibroglandular tissue. There is no sonographic correlate. RECOMMENDATION: 1. Ultrasound-guided core needle biopsy of the 11 o'clock position, 9 mm mass. If pathology reveals carcinoma, as expected, then consider staging breast MRI, in part to further assess the questionable lesion in the inferior aspect of the right breast. I have discussed the findings and recommendations with the patient. If applicable, a reminder letter will be sent to the patient regarding  the next appointment. BI-RADS CATEGORY  5: Highly suggestive of malignancy. Electronically Signed   By: Lajean Manes M.D.   On: 12/22/2018 14:53   MM DIAG BREAST TOMO UNI RIGHT  Result Date: 12/22/2018 CLINICAL DATA:  Screening recall for possible right breast masses, with 1 of masses containing calcifications. EXAM: DIGITAL DIAGNOSTIC RIGHT MAMMOGRAM WITH CAD AND TOMO ULTRASOUND RIGHT BREAST COMPARISON:  Previous exam(s). ACR Breast Density Category b: There are scattered areas of fibroglandular density. FINDINGS: Possible mass noted in the upper outer aspect of the right breast persists on the diagnostic spot-compression images. This appears as an irregular 8-9 mm mass with associated architectural distortion and pleomorphic calcifications. The possible mass noted in the inferior aspect the right breast on the screening MLO view partly disperses. There is a questionable partly defined margin on the spot-compression MLO view. Mammographic images were processed with CAD. On physical exam, no discrete mass is palpated in the inferior or upper outer right breast. Targeted ultrasound is performed, showing a hypoechoic irregular mass in the right breast at 11 o'clock, 2 cm the nipple, containing echogenic foci consistent with calcifications. The mass measures 9 x 8 x 9 mm, corresponding in size, shape and location to the mammographic mass. Sonographic evaluation of the inferior aspect of the right breast shows no masses or abnormalities. There is no sonographic correlate to the questionable mammographic mass. Sonographic evaluation of the right axilla shows no enlarged or abnormal lymph nodes. IMPRESSION: 1. 9 mm mass, with associated calcifications and architectural distortion, in the 11 o'clock position of the right breast, highly suspicious for breast carcinoma. 2. Questionable mass noted in the inferior aspect of the right breast remains equivocal on diagnostic imaging may reflect focal fibroglandular tissue.  There is no sonographic correlate. RECOMMENDATION: 1. Ultrasound-guided core needle biopsy of the 11 o'clock position, 9 mm mass. If pathology reveals carcinoma, as expected, then consider staging breast MRI, in part to further assess the questionable lesion in the inferior aspect of the right breast. I have discussed the findings and recommendations with the patient. If applicable, a reminder letter will be sent to the patient regarding the next appointment. BI-RADS CATEGORY  5: Highly suggestive of malignancy. Electronically Signed   By: Lajean Manes M.D.   On: 12/22/2018 14:53   MM 3D SCREEN BREAST W/IMPLANT BILATERAL  Result Date: 12/20/2018 CLINICAL DATA:  Screening. EXAM: DIGITAL SCREENING BILATERAL MAMMOGRAM  WITH IMPLANTS, CAD AND TOMO The patient has retropectoral implants. Standard and implant displaced views were performed. COMPARISON:  Previous exam(s). ACR Breast Density Category b: There are scattered areas of fibroglandular density. FINDINGS: In the right breast, two possible masses, one with calcifications, warrant further evaluation. In the left breast, no findings suspicious for malignancy. Images were processed with CAD. IMPRESSION: Further evaluation is suggested for possible masses, one with calcifications, in the right breast. RECOMMENDATION: Diagnostic mammogram and possibly ultrasound of the right breast. (Code:[id number]) The patient will be contacted regarding the findings, and additional imaging will be scheduled. BI-RADS CATEGORY  0: Incomplete. Need additional imaging evaluation and/or prior mammograms for comparison. Electronically Signed   By: Claudie Revering M.D.   On: 12/20/2018 17:18   MM CLIP PLACEMENT RIGHT  Addendum Date: 12/24/2018   ADDENDUM REPORT: 12/24/2018 13:26 ADDENDUM: Pathology revealed GRADE II INVASIVE DUCTAL CARCINOMA, DUCTAL CARCINOMA IN SITU WITH NECROSIS of the RIGHT breast, 11 o'clock position. This was found to be concordant by Dr. Hassan Rowan. Pathology results  were discussed with the patient by telephone. The patient reported doing well after the biopsy with tenderness at the site. Post biopsy instructions and care were reviewed and questions were answered. The patient was encouraged to call [facility] for any additional concerns. Recommendation: Consider staging breast MRI, in part to further assess the questionable lesion in the inferior aspect of the RIGHT breast. The patient was referred to [facility] on December 29, 2018. Pathology results reported by Stacie Acres, RN on 12/24/2018. Electronically Signed   By: Margarette Canada M.D.   On: 12/24/2018 13:26   Result Date: 12/24/2018 CLINICAL DATA:  58 year old female for tissue sampling of 0.9 cm UPPER-OUTER RIGHT breast mass. EXAM: ULTRASOUND GUIDED RIGHT BREAST CORE NEEDLE BIOPSY 3D RIGHT MAMMOGRAM POST ULTRASOUND BIOPSY COMPARISON:  Previous exam(s). FINDINGS: I met with the patient and we discussed the procedure of ultrasound-guided biopsy, including benefits and alternatives. We discussed the high likelihood of a successful procedure. We discussed the risks of the procedure, including infection, bleeding, tissue injury, clip migration, and inadequate sampling. Informed written consent was given. The usual time-out protocol was performed immediately prior to the procedure. Lesion quadrant: UPPER-OUTER RIGHT breast Using sterile technique and 1% Lidocaine as local anesthetic, under direct ultrasound visualization, a 12 gauge spring-loaded device was used to perform biopsy of the 0.9 cm mass at the 11 o'clock position of the RIGHT breast 2 cm from the nipple using a LATERAL approach. At the conclusion of the procedure RIBBON tissue marker clip was deployed into the biopsy cavity. Follow up 2 view mammogram was performed demonstrating the RIBBON clip to be in satisfactory position at the expected site of biopsy within  the UPPER-OUTER RIGHT breast and corresponding to the mammographic mass. IMPRESSION: Ultrasound guided biopsy of 0.9 cm UPPER-OUTER RIGHT breast mass. No apparent complications. Satisfactory position of RIBBON clip following ultrasound-guided RIGHT breast biopsy. Electronically Signed: By: Margarette Canada M.D. On: 12/23/2018 15:00   [place] RT BREAST BX W LOC DEV 1ST LESION IMG BX SPEC US GUIDE  Addendum Date: 12/24/2018   ADDENDUM REPORT: 12/24/2018 13:26 ADDENDUM: Pathology revealed GRADE II INVASIVE DUCTAL CARCINOMA, DUCTAL CARCINOMA IN SITU WITH NECROSIS of the RIGHT breast, 11 o'clock position. This was found to be concordant by Dr. Hassan Rowan. Pathology results were discussed with the patient by telephone. The patient reported doing well after the biopsy with tenderness  at the site. Post biopsy instructions and care were reviewed and questions were answered. The patient was encouraged to call [facility] for any additional concerns. Recommendation: Consider staging breast MRI, in part to further assess the questionable lesion in the inferior aspect of the RIGHT breast. The patient was referred to [facility] on December 29, 2018. Pathology results reported by Stacie Acres, RN on 12/24/2018. Electronically Signed   By: Margarette Canada M.D.   On: 12/24/2018 13:26   Result Date: 12/24/2018 CLINICAL DATA:  58 year old female for tissue sampling of 0.9 cm UPPER-OUTER RIGHT breast mass. EXAM: ULTRASOUND GUIDED RIGHT BREAST CORE NEEDLE BIOPSY 3D RIGHT MAMMOGRAM POST ULTRASOUND BIOPSY COMPARISON:  Previous exam(s). FINDINGS: I met with the patient and we discussed the procedure of ultrasound-guided biopsy, including benefits and alternatives. We discussed the high likelihood of a successful procedure. We discussed the risks of the procedure, including infection, bleeding, tissue injury, clip migration, and inadequate sampling.  Informed written consent was given. The usual time-out protocol was performed immediately prior to the procedure. Lesion quadrant: UPPER-OUTER RIGHT breast Using sterile technique and 1% Lidocaine as local anesthetic, under direct ultrasound visualization, a 12 gauge spring-loaded device was used to perform biopsy of the 0.9 cm mass at the 11 o'clock position of the RIGHT breast 2 cm from the nipple using a LATERAL approach. At the conclusion of the procedure RIBBON tissue marker clip was deployed into the biopsy cavity. Follow up 2 view mammogram was performed demonstrating the RIBBON clip to be in satisfactory position at the expected site of biopsy within the UPPER-OUTER RIGHT breast and corresponding to the mammographic mass. IMPRESSION: Ultrasound guided biopsy of 0.9 cm UPPER-OUTER RIGHT breast mass. No apparent complications. Satisfactory position of RIBBON clip following ultrasound-guided RIGHT breast biopsy. Electronically Signed: By: Margarette Canada M.D. On: 12/23/2018 15:00       IMPRESSION/PLAN: 1. 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 Dr. Lisbeth Renshaw discusses the pathology findings and reviews the nature of triple positive breast disease. The consensus from the breast conference includes an MRI of the breasts for the full extent of disease followed by possible breast conservation with lumpectomy with sentinel node biopsy. Dr. Burr Medico is planning systemic therapy with antiHER2 therapy. Following chemotherapy, the patient's course would then be followed by external radiotherapy to the breast followed by antiestrogen therapy. We discussed the risks, benefits, short, and long term effects of radiotherapy, and the patient is interested in proceeding. Dr. Lisbeth Renshaw discusses the delivery and logistics of radiotherapy and anticipates a course of 6 1/2 weeks of radiotherapy due to her insitu implants. We will see her back about 2 weeks after systemic therapy is completed  to discuss the simulation process.  2. Possible genetic predisposition to malignancy. The patient is a candidate for genetic testing given her  personal and family history. She was offered referral and is interested in testing prior to surgery as these results may also impact decisions about surgical resection.  3. GYN Care. The patient has an IUD and has had several since her late 87s and early 68s. It has been used to help dysfunctional uterine bleeding, but she has not had any further bleeding. She considers herself menopausal however. Given the estrogen and progesterone component of her cancer, Dr. Burr Medico has recommended rechecking her hormone levels but is planning to discontinue systemic estrogen.   In a visit lasting  60 minutes, greater than 50% of the time was spent face to face discussing her case, and coordinating the patient's care.  The above documentation reflects my direct findings during this shared patient visit. Please see the separate note by Dr. Lisbeth Renshaw on this date for the remainder of the patient's plan of care.    Carola Rhine, PAC

## 2018-12-29 NOTE — H&P (Signed)
Alicia Hahn Documented: 12/29/2018 7:25 AM Location: [facility] Patient #: [id number] DOB: 1960-06-24 Undefined / Language: Alicia Hahn / Race: White Female  History of Present Illness Alicia Moores A. 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 Microscopic Comment The carcinoma appears grade 2 and measures 7 mm in greatest linear extent. Prognostic makers will be ordered. Dr. Jeannie Done has reviewed the case. The case was called to [facility] on 12/24/2018. Vicente Males MD Pathologist, Electronic Signature (Case signed 12/24/2018) Specimen Gross and Clinical Information Specimen Comment TIF - 2:15 PM; extracted < 1 minute; 0.9cm right breast mass           CLINICAL DATA: Screening recall for possible right breast masses, with 1 of masses containing calcifications.  EXAM: DIGITAL DIAGNOSTIC RIGHT MAMMOGRAM WITH CAD AND TOMO  ULTRASOUND RIGHT BREAST  COMPARISON: Previous exam(s).  ACR Breast Density Category b: There are scattered areas of fibroglandular density.  FINDINGS: Possible mass noted in the upper outer aspect of the right breast persists on the diagnostic spot-compression images. This appears as an irregular 8-9 mm mass with associated architectural distortion and pleomorphic calcifications.  The possible mass noted in the inferior aspect the right breast on the screening MLO view partly disperses. There is a questionable partly defined margin on the spot-compression  MLO view.  Mammographic images were processed with CAD.  On physical exam, no discrete mass is palpated in the inferior or upper outer right breast.  Targeted ultrasound is performed, showing a hypoechoic irregular mass in the right breast at 11 o'clock, 2 cm the nipple, containing echogenic foci consistent with calcifications. The mass measures 9 x 8 x 9 mm, corresponding in size, shape and location to the mammographic mass.  Sonographic evaluation of the inferior aspect of the right breast shows no masses or abnormalities. There is no sonographic correlate to the questionable mammographic mass.  Sonographic evaluation of the right axilla shows no enlarged or abnormal lymph nodes.  IMPRESSION: 1. 9 mm mass, with associated calcifications and architectural distortion, in the 11 o'clock position of the right breast, highly suspicious for breast carcinoma. 2. Questionable mass noted in the inferior aspect of the right breast remains equivocal on diagnostic imaging may reflect focal fibroglandular tissue. There is no sonographic correlate.  RECOMMENDATION: 1. Ultrasound-guided core needle biopsy of the 11 o'clock position, 9 mm mass. If pathology reveals carcinoma, as expected, then consider staging breast MRI, in part to further assess the questionable lesion in the inferior aspect of the right breast.  I have discussed the findings and recommendations with the patient. If applicable, a reminder letter will be sent to the patient regarding the next appointment.  BI-RADS CATEGORY 5: Highly suggestive of malignancy.   Electronically Signed By: Lajean Manes M.D. On: 12/22/2018 14:53.  The patient is a 58 year old female.   Past Surgical History Tawni Pummel, RN; 12/29/2018 7:25 AM) Breast Augmentation Bilateral.  Diagnostic Studies History Tawni Pummel, RN; 12/29/2018 7:25 AM) Colonoscopy never Mammogram within last  year Pap Smear 1-5 years  ago  Medication History Tawni Pummel, RN; 12/29/2018 7:25 AM) Medications Reconciled  Social History Tawni Pummel, RN; 12/29/2018 7:25 AM) Alcohol use Occasional alcohol use. Caffeine use Carbonated beverages, Coffee. No drug use Tobacco use Former smoker.  Family History Tawni Pummel, RN; 12/29/2018 7:25 AM) Alcohol Abuse Family Members In General. Breast Cancer Family Members In General, Mother, Sister. Cerebrovascular Accident Family Members In [place] Members In General. Diabetes Mellitus Father, Mother. Heart Disease Mother. Hypertension Mother. Prostate Cancer Father. Respiratory Condition Mother.  Pregnancy / Birth History Tawni Pummel, RN; 12/29/2018 7:25 AM) Age at menarche 17 years. Contraceptive History Intrauterine device, Oral contraceptives. Gravida 2 Length (months) of breastfeeding 3-6 Maternal age 73-25 Para 2 Regular periods  Other Problems Tawni Pummel, RN; 12/29/2018 7:25 AM) Bladder Problems Gastroesophageal Reflux Disease Heart murmur Hypercholesterolemia Kidney Stone     Review of Systems Sunday Spillers Ledford RN; 12/29/2018 7:25 AM) General Not Present- Appetite Loss, Chills, Fatigue, Fever, Night Sweats, Weight Gain and Weight Loss. Skin Not Present- Change in Wart/Mole, Dryness, Hives, Jaundice, New Lesions, Non-Healing Wounds, Rash and Ulcer. HEENT Present- Wears glasses/contact lenses. Not Present- Earache, Hearing Loss, Hoarseness, Nose Bleed, Oral Ulcers, Ringing in the Ears, Seasonal Allergies, Sinus Pain, Sore Throat, Visual Disturbances and Yellow Eyes. Respiratory Not Present- Bloody sputum, Chronic Cough, Difficulty Breathing, Snoring and Wheezing. Breast Not Present- Breast Mass, Breast Pain, Nipple Discharge and Skin Changes. Cardiovascular Present- Leg Cramps. Not Present- Chest Pain, Difficulty Breathing Lying Down, Palpitations, Rapid Heart Rate, Shortness of Breath and Swelling of  Extremities. Gastrointestinal Not Present- Abdominal Pain, Bloating, Bloody Stool, Change in Bowel Habits, Chronic diarrhea, Constipation, Difficulty Swallowing, Excessive gas, Gets full quickly at meals, Hemorrhoids, Indigestion, Nausea, Rectal Pain and Vomiting. Female Genitourinary Not Present- Frequency, Nocturia, Painful Urination, Pelvic Pain and Urgency. Musculoskeletal Present- Muscle Pain. Not Present- Back Pain, Joint Pain, Joint Stiffness, Muscle Weakness and Swelling of Extremities. Neurological Present- Numbness, Tingling and Weakness. Not Present- Decreased Memory, Fainting, Headaches, Seizures, Tremor and Trouble walking. Psychiatric Present- Change in Sleep Pattern. Not Present- Anxiety, Bipolar, Depression, Fearful and Frequent crying. Endocrine Present- Hot flashes. Not Present- Cold Intolerance, Excessive Hunger, Hair Changes, Heat Intolerance and New Diabetes. Hematology Present- Easy Bruising. Not Present- Blood Thinners, Excessive bleeding, Gland problems, HIV and Persistent Infections.   Physical Exam (Mahamed Zalewski A. Chryl Holten MD; 12/29/2018 3:29 PM)  General Mental Status-Alert. General Appearance-Consistent with stated age. Hydration-Well hydrated. Voice-Normal.  Chest and Lung Exam Note: WOB normal No SOB  Breast Breast - Left-Symmetric, Non Tender, No Biopsy scars, no Dimpling - Left, No Inflammation, No Lumpectomy scars, No Mastectomy scars, No Peau d' Orange. Breast - Right-Symmetric, Non Tender, No Biopsy scars, no Dimpling - Right, No Inflammation, No Lumpectomy scars, No Mastectomy scars, No Peau d' Orange. Breast Lump-No Palpable Breast Mass.  Cardiovascular Note: NSR  Neurologic Neurologic evaluation reveals -alert and oriented x 3 with no impairment of recent or remote memory. Mental Status-Normal.  Musculoskeletal Normal Exam - Left-Upper Extremity Strength Normal and Lower Extremity Strength Normal. Normal Exam - Right-Upper  Extremity Strength Normal and Lower Extremity Strength Normal.  Lymphatic Head & Neck  General Head & Neck Lymphatics: Bilateral - Description - Normal. Axillary  General Axillary Region: Bilateral - Description - Normal. Tenderness - Non Tender.    Assessment & Plan (Taneka Espiritu A. Cassey Hurrell MD; 12/29/2018 3:34 PM)  BREAST CANCER, RIGHT (C50.911) Impression: triple positive needs MRI to define second lesion Good breast conserving candidate needs port for  chemo  Opted for right breast lumpectomy and SLN mapping  port placement  Risk of lumpectomy include bleeding, infection, seroma, more surgery, use of seed/wire, wound care, cosmetic deformity and the need for other treatments, death , blood clots, death. Pt agrees to proceed. Risk of sentinel lymph node mapping include bleeding, infection, lymphedema, shoulder pain. stiffness, dye allergy. cosmetic deformity , blood clots, death, need for more surgery. Pt agrees to proceed.  Pt requires port placement for chemotherapy. Risk include bleeding, infection, pneumothorax, hemothorax, mediastinal injury, nerve injury , blood vessel injury, stroke, blood clots, death, migration. embolization and need for additional procedures. Pt agrees to proceed.  Current Plans You are being scheduled for surgery- Our schedulers will call you.  You should hear from our office's scheduling department within 5 working days about the location, date, and time of surgery. We try to make accommodations for patient's preferences in scheduling surgery, but sometimes the OR schedule or the surgeon's schedule prevents [place] from making those accommodations.  If you have not heard from our office [phone]) in 5 working days, call the office and ask for your surgeon's nurse.  If you have other questions about your diagnosis, plan, or surgery, call the office and ask for your surgeon's nurse.  Pt Education - [facility] Breast Cancer Information Given - [facility] "Breast  Journey" Package We discussed the staging and pathophysiology of breast cancer. We discussed all of the different options for treatment for breast cancer including surgery, chemotherapy, radiation therapy, Herceptin, and antiestrogen therapy. We discussed a sentinel lymph node biopsy as she does not appear to having lymph node involvement right now. We discussed the performance of that with injection of radioactive tracer and blue dye. We discussed that she would have an incision underneath her axillary hairline. We discussed that there is a bout a 10-20% chance of having a positive node with a sentinel lymph node biopsy and we will await the permanent pathology to make any other first further decisions in terms of her treatment. One of these options might be to return to the operating room to perform an axillary lymph node dissection. We discussed about a 1-2% risk lifetime of chronic shoulder pain as well as lymphedema associated with a sentinel lymph node biopsy. We discussed the options for treatment of the breast cancer which included lumpectomy versus a mastectomy. We discussed the performance of the lumpectomy with a wire placement. We discussed a 10-20% chance of a positive margin requiring reexcision in the operating room. We also discussed that she may need radiation therapy or antiestrogen therapy or both if she undergoes lumpectomy. We discussed the mastectomy and the postoperative care for that as well. We discussed that there is no difference in her survival whether she undergoes lumpectomy with radiation therapy or antiestrogen therapy versus a mastectomy. There is a slight difference in the local recurrence rate being 3-5% with lumpectomy and about 1% with a mastectomy. We discussed the risks of operation including bleeding, infection, possible reoperation. She understands her further therapy will be based on what her stages at the time of her operation.  Pt Education - [facility] breast cancer  surgery: discussed with patient and provided information. Use of a central venous catheter for intravenous therapy was discussed. Technique of catheter placement using ultrasound and fluoroscopy guidance was discussed. Risks such as bleeding, infection, pneumothorax, catheter occlusion, reoperation, and other risks were discussed. I noted a good likelihood this will help address the problem. Questions were answered. The patient expressed understanding &  wishes to proceed.

## 2018-12-30 ENCOUNTER — Other Ambulatory Visit: Payer: Self-pay | Admitting: Surgery

## 2018-12-30 ENCOUNTER — Encounter: Payer: Self-pay | Admitting: Licensed Clinical Social Worker

## 2018-12-30 ENCOUNTER — Telehealth: Payer: Self-pay | Admitting: Hematology

## 2018-12-30 DIAGNOSIS — Z1379 Encounter for other screening for genetic and chromosomal anomalies: Secondary | ICD-10-CM | POA: Insufficient documentation

## 2018-12-30 DIAGNOSIS — C50911 Malignant neoplasm of unspecified site of right female breast: Secondary | ICD-10-CM

## 2018-12-30 DIAGNOSIS — Z170 Estrogen receptor positive status [ER+]: Secondary | ICD-10-CM

## 2018-12-30 LAB — GENETIC SCREENING ORDER

## 2018-12-30 NOTE — Telephone Encounter (Signed)
No los per 12/9

## 2018-12-31 ENCOUNTER — Telehealth: Payer: Self-pay | Admitting: *Deleted

## 2018-12-31 NOTE — Progress Notes (Signed)
[facility] Psychosocial Distress Screening Spiritual Care  Met with Liany Mumpower" and her husband in [facility] to introduce [place] team/resources, reviewing distress screen per protocol.  The patient scored a 7 on the Psychosocial Distress Thermometer which indicates severe distress. Also assessed for distress and other psychosocial needs.   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 She also states that [facility] was helpful in reducing distress by resolving many unknowns, creating a plan, and introducing care team.   Follow up needed: No. Encouraged [place] programming, reminding of ongoing [place] team availability as desired. Per couple, no other needs at this time, but please do page if needs arise or circumstances change. Thank you.   [place], [place], [facility] Pager [phone] Voicemail [phone]

## 2018-12-31 NOTE — Telephone Encounter (Signed)
Spoke with patient from [facility].  Assessed navigation needs or concerns.

## 2019-01-01 ENCOUNTER — Telehealth: Payer: Self-pay | Admitting: Hematology and Oncology

## 2019-01-01 NOTE — Telephone Encounter (Signed)
Scheduled appt per 12/11 sch message - pt is aware of appt date and time   

## 2019-01-05 ENCOUNTER — Encounter: Payer: 59 | Admitting: Family Medicine

## 2019-01-07 ENCOUNTER — Other Ambulatory Visit: Payer: Self-pay

## 2019-01-07 ENCOUNTER — Ambulatory Visit (HOSPITAL_COMMUNITY)
Admission: RE | Admit: 2019-01-07 | Discharge: 2019-01-07 | Disposition: A | Discharge status: Home / Self Care | Payer: 59 | Source: Ambulatory Visit | Attending: Hematology | Admitting: Hematology

## 2019-01-07 ENCOUNTER — Ambulatory Visit (HOSPITAL_COMMUNITY): Payer: 59

## 2019-01-07 DIAGNOSIS — Z01812 Encounter for preprocedural laboratory examination: Secondary | ICD-10-CM | POA: Insufficient documentation

## 2019-01-07 DIAGNOSIS — Z87891 Personal history of nicotine dependence: Secondary | ICD-10-CM | POA: Insufficient documentation

## 2019-01-07 DIAGNOSIS — C50411 Malignant neoplasm of upper-outer quadrant of right female breast: Secondary | ICD-10-CM | POA: Insufficient documentation

## 2019-01-07 DIAGNOSIS — Z170 Estrogen receptor positive status [ER+]: Secondary | ICD-10-CM

## 2019-01-07 DIAGNOSIS — Z01810 Encounter for preprocedural cardiovascular examination: Secondary | ICD-10-CM | POA: Insufficient documentation

## 2019-01-07 NOTE — Progress Notes (Signed)
  Echocardiogram 2D Echocardiogram has been performed.  Alicia Hahn 01/07/2019, 10:18 AM

## 2019-01-12 ENCOUNTER — Telehealth: Payer: Self-pay

## 2019-01-12 NOTE — Telephone Encounter (Signed)
Nutrition Assessment  Reason for Assessment:  Pt attended Breast Clinic on 12/29/2018 and received nutrition packet from nurse navigator  ASSESSMENT:  58 year old female with right breast cancer triple positive.  Planning surgery, followed by chemotherapy, radiation and antiestrogens.   Spoke with patient via phone to introduce self and service at [facility].  Patient reports that he has a normal appetite, "too good"  Medications:  MVI, vit D, magnesium, calcium  Labs: reviewed  Anthropometrics:   Height: 63 inches Weight: 171 lb BMI: 30   NUTRITION DIAGNOSIS: Food and nutrition related knowledge deficit related to new diagnosis of breast cancer as evidenced by no prior need for nutrition related information.  INTERVENTION:   Discussed briefly packet of information regarding nutritional tips for breast cancer patients.  No questions at this time.  Contact information provided and patient knows to contact me with questions/concerns.    MONITORING, EVALUATION, and GOAL: Pt will consume a healthy plant based diet to maintain lean body mass throughout treatment.   Lavonda Thal B. Zenia Resides, [place], [place] [facility] [place] Registered Dietitian [phone] (pager)

## 2019-01-13 ENCOUNTER — Ambulatory Visit (HOSPITAL_COMMUNITY)
Admission: RE | Admit: 2019-01-13 | Discharge: 2019-01-13 | Disposition: A | Discharge status: Home / Self Care | Payer: 59 | Source: Ambulatory Visit | Attending: Surgery | Admitting: Surgery

## 2019-01-13 ENCOUNTER — Other Ambulatory Visit: Payer: Self-pay

## 2019-01-13 DIAGNOSIS — C50411 Malignant neoplasm of upper-outer quadrant of right female breast: Secondary | ICD-10-CM | POA: Diagnosis not present

## 2019-01-13 DIAGNOSIS — Z170 Estrogen receptor positive status [ER+]: Secondary | ICD-10-CM | POA: Insufficient documentation

## 2019-01-13 MED ORDER — GADOBUTROL 1 MMOL/ML IV SOLN
8.0000 mL | Freq: Once | INTRAVENOUS | Status: AC | PRN
  Administered 2019-01-13: 8 mL via INTRAVENOUS

## 2019-01-19 ENCOUNTER — Encounter: Payer: Self-pay | Admitting: *Deleted

## 2019-01-19 ENCOUNTER — Encounter (HOSPITAL_BASED_OUTPATIENT_CLINIC_OR_DEPARTMENT_OTHER): Payer: Self-pay | Admitting: Surgery

## 2019-01-19 ENCOUNTER — Other Ambulatory Visit: Payer: Self-pay

## 2019-01-24 ENCOUNTER — Other Ambulatory Visit (HOSPITAL_COMMUNITY)
Admission: RE | Admit: 2019-01-24 | Discharge: 2019-01-24 | Disposition: A | Discharge status: Home / Self Care | Payer: 59 | Source: Ambulatory Visit | Attending: Surgery | Admitting: Surgery

## 2019-01-24 DIAGNOSIS — Z20822 Contact with and (suspected) exposure to covid-19: Secondary | ICD-10-CM | POA: Diagnosis not present

## 2019-01-24 DIAGNOSIS — Z01812 Encounter for preprocedural laboratory examination: Secondary | ICD-10-CM | POA: Diagnosis present

## 2019-01-24 LAB — SARS CORONAVIRUS 2 (TAT 6-24 HRS): SARS Coronavirus 2: NEGATIVE

## 2019-01-25 ENCOUNTER — Encounter (HOSPITAL_BASED_OUTPATIENT_CLINIC_OR_DEPARTMENT_OTHER)
Admission: RE | Admit: 2019-01-25 | Discharge: 2019-01-25 | Disposition: A | Discharge status: Home / Self Care | Payer: 59 | Source: Ambulatory Visit | Attending: Surgery | Admitting: Surgery

## 2019-01-25 ENCOUNTER — Other Ambulatory Visit: Payer: Self-pay

## 2019-01-25 DIAGNOSIS — Z01812 Encounter for preprocedural laboratory examination: Secondary | ICD-10-CM | POA: Insufficient documentation

## 2019-01-25 LAB — CBC WITH DIFFERENTIAL/PLATELET
Abs Immature Granulocytes: 0.01 10*3/uL (ref 0.00–0.07)
Basophils Absolute: 0.1 10*3/uL (ref 0.0–0.1)
Basophils Relative: 1 %
Eosinophils Absolute: 0.2 10*3/uL (ref 0.0–0.5)
Eosinophils Relative: 4 %
HCT: 43.5 % (ref 36.0–46.0)
Hemoglobin: 14.7 g/dL (ref 12.0–15.0)
Immature Granulocytes: 0 %
Lymphocytes Relative: 32 %
Lymphs Abs: 1.6 10*3/uL (ref 0.7–4.0)
MCH: 31.1 pg (ref 26.0–34.0)
MCHC: 33.8 g/dL (ref 30.0–36.0)
MCV: 92 fL (ref 80.0–100.0)
Monocytes Absolute: 0.6 10*3/uL (ref 0.1–1.0)
Monocytes Relative: 11 %
Neutro Abs: 2.7 10*3/uL (ref 1.7–7.7)
Neutrophils Relative %: 52 %
Platelets: 201 10*3/uL (ref 150–400)
RBC: 4.73 MIL/uL (ref 3.87–5.11)
RDW: 11.9 % (ref 11.5–15.5)
WBC: 5.1 10*3/uL (ref 4.0–10.5)
nRBC: 0 % (ref 0.0–0.2)

## 2019-01-25 LAB — COMPREHENSIVE METABOLIC PANEL
ALT: 44 U/L (ref 0–44)
AST: 29 U/L (ref 15–41)
Albumin: 3.9 g/dL (ref 3.5–5.0)
Alkaline Phosphatase: 57 U/L (ref 38–126)
Anion gap: 11 (ref 5–15)
BUN: 12 mg/dL (ref 6–20)
CO2: 24 mmol/L (ref 22–32)
Calcium: 9.3 mg/dL (ref 8.9–10.3)
Chloride: 106 mmol/L (ref 98–111)
Creatinine, Ser: 0.75 mg/dL (ref 0.44–1.00)
GFR calc Af Amer: >60 mL/min (ref >60)
GFR calc non Af Amer: >60 mL/min (ref >60)
Glucose, Bld: 100 mg/dL — ABNORMAL HIGH (ref 70–99)
Potassium: 4.3 mmol/L (ref 3.5–5.1)
Sodium: 141 mmol/L (ref 135–145)
Total Bilirubin: 0.6 mg/dL (ref 0.3–1.2)
Total Protein: 6.4 g/dL — ABNORMAL LOW (ref 6.5–8.1)

## 2019-01-25 NOTE — Progress Notes (Signed)

## 2019-01-26 ENCOUNTER — Ambulatory Visit
Admission: RE | Admit: 2019-01-26 | Discharge: 2019-01-26 | Disposition: A | Discharge status: Home / Self Care | Payer: 59 | Source: Ambulatory Visit | Attending: Surgery | Admitting: Surgery

## 2019-01-26 ENCOUNTER — Other Ambulatory Visit: Payer: Self-pay

## 2019-01-26 DIAGNOSIS — Z170 Estrogen receptor positive status [ER+]: Secondary | ICD-10-CM

## 2019-01-26 DIAGNOSIS — C50911 Malignant neoplasm of unspecified site of right female breast: Secondary | ICD-10-CM

## 2019-01-27 ENCOUNTER — Other Ambulatory Visit: Payer: Self-pay

## 2019-01-27 ENCOUNTER — Encounter (HOSPITAL_BASED_OUTPATIENT_CLINIC_OR_DEPARTMENT_OTHER): Payer: Self-pay | Admitting: Surgery

## 2019-01-27 ENCOUNTER — Ambulatory Visit (HOSPITAL_COMMUNITY): Payer: 59

## 2019-01-27 ENCOUNTER — Ambulatory Visit (HOSPITAL_BASED_OUTPATIENT_CLINIC_OR_DEPARTMENT_OTHER): Payer: 59 | Admitting: Anesthesiology

## 2019-01-27 ENCOUNTER — Encounter (HOSPITAL_BASED_OUTPATIENT_CLINIC_OR_DEPARTMENT_OTHER)
Admission: RE | Disposition: A | Discharge status: Home / Self Care | Payer: Self-pay | Source: Home / Self Care | Attending: Surgery

## 2019-01-27 ENCOUNTER — Ambulatory Visit (HOSPITAL_COMMUNITY)
Admission: RE | Admit: 2019-01-27 | Discharge: 2019-01-27 | Disposition: A | Discharge status: Home / Self Care | Payer: 59 | Source: Ambulatory Visit | Attending: Surgery | Admitting: Surgery

## 2019-01-27 ENCOUNTER — Ambulatory Visit
Admission: RE | Admit: 2019-01-27 | Discharge: 2019-01-27 | Disposition: A | Discharge status: Home / Self Care | Payer: 59 | Source: Ambulatory Visit | Attending: Surgery | Admitting: Surgery

## 2019-01-27 ENCOUNTER — Ambulatory Visit (HOSPITAL_BASED_OUTPATIENT_CLINIC_OR_DEPARTMENT_OTHER)
Admission: RE | Admit: 2019-01-27 | Discharge: 2019-01-27 | Disposition: A | Discharge status: Home / Self Care | Payer: 59 | Attending: Surgery | Admitting: Surgery

## 2019-01-27 DIAGNOSIS — Z452 Encounter for adjustment and management of vascular access device: Secondary | ICD-10-CM | POA: Diagnosis not present

## 2019-01-27 DIAGNOSIS — Z95828 Presence of other vascular implants and grafts: Secondary | ICD-10-CM

## 2019-01-27 DIAGNOSIS — C50411 Malignant neoplasm of upper-outer quadrant of right female breast: Secondary | ICD-10-CM | POA: Insufficient documentation

## 2019-01-27 DIAGNOSIS — C50911 Malignant neoplasm of unspecified site of right female breast: Secondary | ICD-10-CM

## 2019-01-27 DIAGNOSIS — Z803 Family history of malignant neoplasm of breast: Secondary | ICD-10-CM | POA: Insufficient documentation

## 2019-01-27 DIAGNOSIS — Z87891 Personal history of nicotine dependence: Secondary | ICD-10-CM | POA: Diagnosis not present

## 2019-01-27 DIAGNOSIS — Z01812 Encounter for preprocedural laboratory examination: Secondary | ICD-10-CM | POA: Diagnosis not present

## 2019-01-27 DIAGNOSIS — Z170 Estrogen receptor positive status [ER+]: Secondary | ICD-10-CM

## 2019-01-27 HISTORY — PX: PORTACATH PLACEMENT: SHX2246

## 2019-01-27 HISTORY — DX: Malignant (primary) neoplasm, unspecified: C80.1

## 2019-01-27 HISTORY — PX: BREAST LUMPECTOMY WITH RADIOACTIVE SEED AND SENTINEL LYMPH NODE BIOPSY: SHX6550

## 2019-01-27 SURGERY — BREAST LUMPECTOMY WITH RADIOACTIVE SEED AND SENTINEL LYMPH NODE BIOPSY
Anesthesia: General | Site: Chest | Laterality: Right

## 2019-01-27 MED ORDER — IBUPROFEN 800 MG PO TABS: 800.0000 mg | ORAL_TABLET | Freq: Three times a day (TID) | ORAL | 0 refills | Status: DC | PRN

## 2019-01-27 MED ORDER — PROPOFOL 10 MG/ML IV BOLUS
INTRAVENOUS | Status: AC
  Filled 2019-01-27: qty 20

## 2019-01-27 MED ORDER — MIDAZOLAM HCL 2 MG/2ML IJ SOLN
1.0000 mg | INTRAMUSCULAR | Status: DC | PRN
  Administered 2019-01-27: 10:00:00 2 mg via INTRAVENOUS

## 2019-01-27 MED ORDER — CEFAZOLIN SODIUM-DEXTROSE 2-4 GM/100ML-% IV SOLN
2.0000 g | INTRAVENOUS | Status: AC
  Administered 2019-01-27: 2 g via INTRAVENOUS

## 2019-01-27 MED ORDER — LACTATED RINGERS IV SOLN: INTRAVENOUS | Status: DC

## 2019-01-27 MED ORDER — FENTANYL CITRATE (PF) 100 MCG/2ML IJ SOLN
INTRAMUSCULAR | Status: AC
  Filled 2019-01-27: qty 2

## 2019-01-27 MED ORDER — TECHNETIUM TC 99M SULFUR COLLOID FILTERED: 1.0000 | Freq: Once | INTRAVENOUS | Status: DC | PRN

## 2019-01-27 MED ORDER — CHLORHEXIDINE GLUCONATE CLOTH 2 % EX PADS: 6.0000 | MEDICATED_PAD | Freq: Once | CUTANEOUS | Status: DC

## 2019-01-27 MED ORDER — FENTANYL CITRATE (PF) 100 MCG/2ML IJ SOLN
INTRAMUSCULAR | Status: DC | PRN
  Administered 2019-01-27 (×4): 25 ug via INTRAVENOUS

## 2019-01-27 MED ORDER — HEPARIN (PORCINE) IN NACL 1000-0.9 UT/500ML-% IV SOLN
INTRAVENOUS | Status: AC
  Filled 2019-01-27: qty 500

## 2019-01-27 MED ORDER — FENTANYL CITRATE (PF) 100 MCG/2ML IJ SOLN
25.0000 ug | INTRAMUSCULAR | Status: DC | PRN
  Administered 2019-01-27: 14:00:00 50 ug via INTRAVENOUS

## 2019-01-27 MED ORDER — ACETAMINOPHEN 160 MG/5ML PO SOLN: 325.0000 mg | ORAL | Status: DC | PRN

## 2019-01-27 MED ORDER — LIDOCAINE HCL (CARDIAC) PF 100 MG/5ML IV SOSY
PREFILLED_SYRINGE | INTRAVENOUS | Status: DC | PRN
  Administered 2019-01-27: 40 mg via INTRAVENOUS

## 2019-01-27 MED ORDER — EPHEDRINE 5 MG/ML INJ
INTRAVENOUS | Status: AC
  Filled 2019-01-27: qty 10

## 2019-01-27 MED ORDER — FENTANYL CITRATE (PF) 100 MCG/2ML IJ SOLN
50.0000 ug | INTRAMUSCULAR | Status: DC | PRN
  Administered 2019-01-27: 10:00:00 100 ug via INTRAVENOUS

## 2019-01-27 MED ORDER — ACETAMINOPHEN 325 MG PO TABS: 325.0000 mg | ORAL_TABLET | ORAL | Status: DC | PRN

## 2019-01-27 MED ORDER — ACETAMINOPHEN 500 MG PO TABS
1000.0000 mg | ORAL_TABLET | ORAL | Status: AC
  Administered 2019-01-27: 10:00:00 1000 mg via ORAL

## 2019-01-27 MED ORDER — PROPOFOL 10 MG/ML IV BOLUS
INTRAVENOUS | Status: DC | PRN
  Administered 2019-01-27: 200 mg via INTRAVENOUS

## 2019-01-27 MED ORDER — ONDANSETRON HCL 4 MG/2ML IJ SOLN
INTRAMUSCULAR | Status: DC | PRN
  Administered 2019-01-27: 4 mg via INTRAVENOUS

## 2019-01-27 MED ORDER — HYDROCODONE-ACETAMINOPHEN 5-325 MG PO TABS: 1.0000 | ORAL_TABLET | Freq: Four times a day (QID) | ORAL | 0 refills | Status: DC | PRN

## 2019-01-27 MED ORDER — MIDAZOLAM HCL 2 MG/2ML IJ SOLN
INTRAMUSCULAR | Status: AC
  Filled 2019-01-27: qty 2

## 2019-01-27 MED ORDER — BUPIVACAINE LIPOSOME 1.3 % IJ SUSP
INTRAMUSCULAR | Status: DC | PRN
  Administered 2019-01-27: 10 mL

## 2019-01-27 MED ORDER — GABAPENTIN 300 MG PO CAPS: 300.0000 mg | ORAL_CAPSULE | ORAL | Status: DC

## 2019-01-27 MED ORDER — BUPIVACAINE-EPINEPHRINE (PF) 0.25% -1:200000 IJ SOLN
INTRAMUSCULAR | Status: DC | PRN
  Administered 2019-01-27: 30 mL

## 2019-01-27 MED ORDER — ACETAMINOPHEN 500 MG PO TABS: 1000.0000 mg | ORAL_TABLET | ORAL | Status: DC

## 2019-01-27 MED ORDER — HEPARIN SOD (PORK) LOCK FLUSH 100 UNIT/ML IV SOLN
INTRAVENOUS | Status: DC | PRN
  Administered 2019-01-27: 500 [IU] via INTRAVENOUS

## 2019-01-27 MED ORDER — CEFAZOLIN SODIUM-DEXTROSE 2-4 GM/100ML-% IV SOLN
INTRAVENOUS | Status: AC
  Filled 2019-01-27: qty 100

## 2019-01-27 MED ORDER — OXYCODONE HCL 5 MG/5ML PO SOLN: 5.0000 mg | Freq: Once | ORAL | Status: DC | PRN

## 2019-01-27 MED ORDER — EPHEDRINE SULFATE-NACL 50-0.9 MG/10ML-% IV SOSY
PREFILLED_SYRINGE | INTRAVENOUS | Status: DC | PRN
  Administered 2019-01-27 (×3): 5 mg via INTRAVENOUS

## 2019-01-27 MED ORDER — METHYLENE BLUE 0.5 % INJ SOLN
INTRAVENOUS | Status: AC
  Filled 2019-01-27: qty 10

## 2019-01-27 MED ORDER — SODIUM CHLORIDE (PF) 0.9 % IJ SOLN
INTRAMUSCULAR | Status: AC
  Filled 2019-01-27: qty 10

## 2019-01-27 MED ORDER — HEPARIN (PORCINE) IN NACL 2-0.9 UNITS/ML
INTRAMUSCULAR | Status: AC | PRN
  Administered 2019-01-27: 500 mL via INTRAVENOUS

## 2019-01-27 MED ORDER — HEPARIN SOD (PORK) LOCK FLUSH 100 UNIT/ML IV SOLN
INTRAVENOUS | Status: AC
  Filled 2019-01-27: qty 5

## 2019-01-27 MED ORDER — ONDANSETRON HCL 4 MG/2ML IJ SOLN: 4.0000 mg | Freq: Once | INTRAMUSCULAR | Status: DC | PRN

## 2019-01-27 MED ORDER — ACETAMINOPHEN 500 MG PO TABS
ORAL_TABLET | ORAL | Status: AC
  Filled 2019-01-27: qty 2

## 2019-01-27 MED ORDER — GABAPENTIN 300 MG PO CAPS
300.0000 mg | ORAL_CAPSULE | ORAL | Status: AC
  Administered 2019-01-27: 10:00:00 300 mg via ORAL

## 2019-01-27 MED ORDER — OXYCODONE HCL 5 MG PO TABS: 5.0000 mg | ORAL_TABLET | Freq: Once | ORAL | Status: DC | PRN

## 2019-01-27 MED ORDER — GABAPENTIN 300 MG PO CAPS
ORAL_CAPSULE | ORAL | Status: AC
  Filled 2019-01-27: qty 1

## 2019-01-27 MED ORDER — MEPERIDINE HCL 25 MG/ML IJ SOLN: 6.2500 mg | INTRAMUSCULAR | Status: DC | PRN

## 2019-01-27 MED ORDER — BUPIVACAINE HCL (PF) 0.25 % IJ SOLN
INTRAMUSCULAR | Status: DC | PRN
  Administered 2019-01-27: 20 mL

## 2019-01-27 MED ORDER — DEXAMETHASONE SODIUM PHOSPHATE 10 MG/ML IJ SOLN
INTRAMUSCULAR | Status: DC | PRN
  Administered 2019-01-27: 5 mg via INTRAVENOUS

## 2019-01-27 SURGICAL SUPPLY — 81 items
ADH SKN CLS APL DERMABOND .7 (GAUZE/BANDAGES/DRESSINGS) ×2
APL PRP STRL LF DISP 70% ISPRP (MISCELLANEOUS) ×4
APL SKNCLS STERI-STRIP NONHPOA (GAUZE/BANDAGES/DRESSINGS)
APPLIER CLIP 9.375 MED OPEN (MISCELLANEOUS) ×3
APR CLP MED 9.3 20 MLT OPN (MISCELLANEOUS) ×2
BAG DECANTER FOR FLEXI CONT (MISCELLANEOUS) ×3 IMPLANT
BENZOIN TINCTURE PRP APPL 2/3 (GAUZE/BANDAGES/DRESSINGS) IMPLANT
BINDER BREAST LRG (GAUZE/BANDAGES/DRESSINGS) IMPLANT
BINDER BREAST MEDIUM (GAUZE/BANDAGES/DRESSINGS) IMPLANT
BINDER BREAST XLRG (GAUZE/BANDAGES/DRESSINGS) ×1 IMPLANT
BINDER BREAST XXLRG (GAUZE/BANDAGES/DRESSINGS) IMPLANT
BLADE HEX COATED 2.75 (ELECTRODE) ×3 IMPLANT
BLADE SURG 11 STRL SS (BLADE) ×3 IMPLANT
BLADE SURG 15 STRL LF DISP TIS (BLADE) ×2 IMPLANT
BLADE SURG 15 STRL SS (BLADE) ×6
CANISTER SUC SOCK COL 7IN (MISCELLANEOUS) IMPLANT
CANISTER SUCT 1200ML W/VALVE (MISCELLANEOUS) ×3 IMPLANT
CHLORAPREP W/TINT 26 (MISCELLANEOUS) ×4 IMPLANT
CLIP APPLIE 9.375 MED OPEN (MISCELLANEOUS) ×2 IMPLANT
COVER BACK TABLE 60X90IN (DRAPES) ×3 IMPLANT
COVER MAYO STAND STRL (DRAPES) ×3 IMPLANT
COVER PROBE 5X48 (MISCELLANEOUS) ×3
COVER PROBE W GEL 5X96 (DRAPES) ×3 IMPLANT
COVER SURGICAL LIGHT HANDLE (MISCELLANEOUS) ×1 IMPLANT
COVER WAND RF STERILE (DRAPES) IMPLANT
DECANTER SPIKE VIAL GLASS SM (MISCELLANEOUS) IMPLANT
DERMABOND ADVANCED (GAUZE/BANDAGES/DRESSINGS) ×1
DERMABOND ADVANCED .7 DNX12 (GAUZE/BANDAGES/DRESSINGS) ×2 IMPLANT
DRAPE C-ARM 42X72 X-RAY (DRAPES) ×3 IMPLANT
DRAPE LAPAROSCOPIC ABDOMINAL (DRAPES) ×3 IMPLANT
DRAPE LAPAROTOMY 100X72 PEDS (DRAPES) ×1 IMPLANT
DRAPE UTILITY XL STRL (DRAPES) ×4 IMPLANT
DRSG TEGADERM 2-3/8X2-3/4 SM (GAUZE/BANDAGES/DRESSINGS) IMPLANT
DRSG TEGADERM 4X4.75 (GAUZE/BANDAGES/DRESSINGS) IMPLANT
ELECT COATED BLADE 2.86 ST (ELECTRODE) ×3 IMPLANT
ELECT REM PT RETURN 9FT ADLT (ELECTROSURGICAL) ×3
ELECTRODE REM PT RTRN 9FT ADLT (ELECTROSURGICAL) ×2 IMPLANT
GAUZE SPONGE 4X4 12PLY STRL LF (GAUZE/BANDAGES/DRESSINGS) IMPLANT
GLOVE BIO SURGEON STRL SZ 6.5 (GLOVE) ×1 IMPLANT
GLOVE BIOGEL PI IND STRL 6.5 (GLOVE) IMPLANT
GLOVE BIOGEL PI IND STRL 8 (GLOVE) ×2 IMPLANT
GLOVE BIOGEL PI INDICATOR 6.5 (GLOVE) ×1
GLOVE BIOGEL PI INDICATOR 8 (GLOVE) ×2
GLOVE ECLIPSE 8.0 STRL XLNG CF (GLOVE) ×4 IMPLANT
GLOVE EXAM NITRILE MD LF STRL (GLOVE) ×2 IMPLANT
GOWN STRL REUS W/ TWL LRG LVL3 (GOWN DISPOSABLE) ×4 IMPLANT
GOWN STRL REUS W/TWL LRG LVL3 (GOWN DISPOSABLE) ×9
HEMOSTAT ARISTA ABSORB 3G PWDR (HEMOSTASIS) IMPLANT
HEMOSTAT SNOW SURGICEL 2X4 (HEMOSTASIS) ×1 IMPLANT
IV KIT MINILOC 20X1 SAFETY (NEEDLE) IMPLANT
KIT CVR 48X5XPRB PLUP LF (MISCELLANEOUS) ×2 IMPLANT
KIT MARKER MARGIN INK (KITS) ×3 IMPLANT
KIT PORT POWER 8FR ISP CVUE (Port) ×1 IMPLANT
NDL HYPO 25X1 1.5 SAFETY (NEEDLE) ×2 IMPLANT
NDL SAFETY ECLIPSE 18X1.5 (NEEDLE) IMPLANT
NDL SPNL 22GX3.5 QUINCKE BK (NEEDLE) IMPLANT
NEEDLE HYPO 18GX1.5 SHARP (NEEDLE)
NEEDLE HYPO 22GX1.5 SAFETY (NEEDLE) IMPLANT
NEEDLE HYPO 25X1 1.5 SAFETY (NEEDLE) ×3 IMPLANT
NEEDLE SPNL 22GX3.5 QUINCKE BK (NEEDLE) IMPLANT
NS IRRIG 1000ML POUR BTL (IV SOLUTION) ×3 IMPLANT
PACK BASIN DAY SURGERY FS (CUSTOM PROCEDURE TRAY) ×3 IMPLANT
PENCIL SMOKE EVACUATOR (MISCELLANEOUS) ×3 IMPLANT
SET SHEATH INTRODUCER 10FR (MISCELLANEOUS) IMPLANT
SHEATH COOK PEEL AWAY SET 9F (SHEATH) IMPLANT
SLEEVE SCD COMPRESS KNEE MED (MISCELLANEOUS) ×3 IMPLANT
SPONGE LAP 4X18 RFD (DISPOSABLE) ×5 IMPLANT
STRIP CLOSURE SKIN 1/2X4 (GAUZE/BANDAGES/DRESSINGS) IMPLANT
SUT MNCRL AB 4-0 PS2 18 (SUTURE) ×4 IMPLANT
SUT MON AB 4-0 PC3 18 (SUTURE) ×3 IMPLANT
SUT PROLENE 2 0 CT2 30 (SUTURE) IMPLANT
SUT PROLENE 2 0 SH DA (SUTURE) ×3 IMPLANT
SUT SILK 2 0 TIES 17X18 (SUTURE)
SUT SILK 2-0 18XBRD TIE BLK (SUTURE) IMPLANT
SUT VICRYL 3-0 CR8 SH (SUTURE) ×4 IMPLANT
SYR 5ML LUER SLIP (SYRINGE) ×3 IMPLANT
SYR CONTROL 10ML LL (SYRINGE) ×3 IMPLANT
TOWEL GREEN STERILE FF (TOWEL DISPOSABLE) ×6 IMPLANT
TRAY FAXITRON CT DISP (TRAY / TRAY PROCEDURE) ×3 IMPLANT
TUBE CONNECTING 20X1/4 (TUBING) ×3 IMPLANT
YANKAUER SUCT BULB TIP NO VENT (SUCTIONS) ×3 IMPLANT

## 2019-01-27 NOTE — Progress Notes (Signed)
Nuc med injections completed. Patient tolerated well.   

## 2019-01-27 NOTE — Anesthesia Preprocedure Evaluation (Signed)
Anesthesia Evaluation  Patient identified by MRN, date of birth, ID band Patient awake    Reviewed: Allergy & Precautions, H&P , NPO status , Patient's Chart, lab work & pertinent test results, reviewed documented beta blocker date and time   Airway Mallampati: II  TM Distance: >3 FB Neck ROM: full    Dental no notable dental hx.    Pulmonary neg pulmonary ROS, former smoker,    Pulmonary exam normal breath sounds clear to auscultation       Cardiovascular Exercise Tolerance: Good negative cardio ROS Normal cardiovascular exam Rhythm:regular Rate:Normal  ECHO 12/20 NL EF grade 1 dd   Neuro/Psych negative neurological ROS  negative psych ROS   GI/Hepatic negative GI ROS, Neg liver ROS,   Endo/Other  negative endocrine ROS  Renal/GU negative Renal ROS  negative genitourinary   Musculoskeletal   Abdominal   Peds  Hematology negative hematology ROS (+)   Anesthesia Other Findings   Reproductive/Obstetrics negative OB ROS                             Anesthesia Physical Anesthesia Plan  ASA: II  Anesthesia Plan: General   Post-op Pain Management: GA combined w/ Regional for post-op pain   Induction:   PONV Risk Score and Plan: 3 and Ondansetron, Dexamethasone, Treatment may vary due to age or medical condition and Midazolam  Airway Management Planned: Oral ETT and LMA  Additional Equipment:   Intra-op Plan:   Post-operative Plan: Extubation in OR  Informed Consent: I have reviewed the patients History and Physical, chart, labs and discussed the procedure including the risks, benefits and alternatives for the proposed anesthesia with the patient or authorized representative who has indicated his/her understanding and acceptance.     Dental Advisory Given  Plan Discussed with: CRNA, Anesthesiologist and Surgeon  Anesthesia Plan Comments: (Discussed both nerve block for pain  relief post-op and GA; including NV, sore throat, dental injury, and pulmonary complications)        Anesthesia Quick Evaluation

## 2019-01-27 NOTE — Anesthesia Procedure Notes (Signed)
Procedure Name: LMA Insertion Date/Time: 01/27/2019 11:15 AM Performed by: Raenette Rover, CRNA Pre-anesthesia Checklist: Patient identified, Emergency Drugs available, Suction available and Patient being monitored Patient Re-evaluated:Patient Re-evaluated prior to induction Oxygen Delivery Method: Circle system utilized Preoxygenation: Pre-oxygenation with 100% oxygen Induction Type: IV induction LMA: LMA inserted LMA Size: 4.0 Number of attempts: 1 Placement Confirmation: positive ETCO2 and breath sounds checked- equal and bilateral Tube secured with: Tape Dental Injury: Teeth and Oropharynx as per pre-operative assessment

## 2019-01-27 NOTE — Progress Notes (Signed)
Assisted Dr. Oddono with right, ultrasound guided, pectoralis block. Side rails up, monitors on throughout procedure. See vital signs in flow sheet. Tolerated Procedure well. 

## 2019-01-27 NOTE — Op Note (Signed)
Preoperative diagnosis: Stage I right breast cancer upper outer quadrant  Postoperative diagnosis: Same  Procedure: Right seed localized right breast mastectomy with right axillary sentinel lymph node mapping and placement of right 8 [place] internal jugular Port-A-Cath with fluoroscopic and C-arm guidance  Surgeon: Erroll Luna, MD  Anesthesia: LMA with local plus right pectoral block  EBL: 20 cc  Specimen: Right breast mass with seed and clip verified by Faxitron plus additional superior margin and 3 right axillary sentinel nodes to pathology  Drains: None  IV fluids: Per anesthesia record  Indications for procedure: Patient presents for right breast lumpectomy with sentinel lymph node mapping and port placement for stage I right breast cancer.  She was seen in the multidisciplinary clinic by medical and radiation oncology in options of breast conserving surgery versus mastectomy were discussed with reconstruction.  The pros, cons and long-term expectations of both procedures and treatments were discussed as outlined in the history and physical.  She requires postoperative chemotherapy therefore port will be placed today as well.  This was discussed with the patient as well as complications of port placement of bleeding, infection, collapsed lung, bleeding of the lung, mediastinal injury, and the need for revision and/or subsequent removal.The procedure has been discussed with the patient. Alternatives to surgery have been discussed with the patient.  Risks of surgery include bleeding,  Infection,  Seroma formation, death,  and the need for further surgery.   The patient understands and wishes to proceed.Sentinel lymph node mapping and dissection has been discussed with the patient.  Risk of bleeding,  Infection,  Seroma formation,  Additional procedures,,  Shoulder weakness ,  Shoulder stiffness, arm swelling  Nerve and blood vessel injury and reaction to the mapping dyes have been  discussed.  Alternatives to surgery have been discussed with the patient.  The patient agrees to proceed.   Description of procedure: The patient was met in the holding area and questions were answered.  Neoprobe used to identify seed in the right breast.  She underwent right pectoral block and injection of technetium sulfur colloid per anesthesia protocol.  She was then taken back the operating.  She is placed supine upon the OR table.  After induction of general esthesia the right breast and right neck were prepped and draped in sterile fashion.  The Port-A-Cath was placed first.  Timeout was performed.  The patient was placed in Trendelenburg position.  Ultrasound was used to locate the right internal jugular vein and under direct ultrasound guidance a needle was placed into the right IJ with good return of dark nonpulsatile blood.  A wire was fed through this.  C-arm was used to verify wire location in the superior vena cava.  Patient was then returned flat.  Small incision was made at the neck incision site.  A 3 cm incision was made below the right clavicle.  Dissection was carried down until the fascia of the pectoralis major was identified a small pocket was bluntly dissected out.  The port was assembled.  He was then tunneled from the lower incision to the upper incision.  It was cut to 19 cm.  With the patient back in Trendelenburg a dilator and reduce her complex was placed over the wire moving the wire to and fro as it was advanced with no resistance.  The wire and dilator were removed.  The catheter was fed through the introducer and the peel-away sheath was peeled away.  C arm showed the tip to be  in the lower SVC without kinking.  It drew back easily with nonpulsatile flow.  We then secured the hub of the port to the chest wall with 2-0 Prolene.  There is some oozing from the catheter tract site controlled pressure and Surgicel snow with good hemostasis.  We then closed with incision with 3-0  Vicryl and 4-0 Monocryl.  Dermabond applied.  The patient was rereprepped and draped in sterile fashion.  A second timeout was performed.  Neoprobe used to identify the seed in the right breast upper outer quadrant.  Curvilinear incision made at the lateral border of the nipple were complex.  Dissection was carried down and all tissue around the seed and clip were excised with a grossly negative margin.  Faxitron revealed both seed and clip to be in the specimen.  The superior margin low close therefore took additional margin from the superior part of the lumpectomy cavity.  Hemostasis was achieved with cautery.  We then closed with 3-0 Vicryl and 4-0 Monocryl.  The seed, clip and tissue specimens were sent off.  The right axillary sentinel node was done next.  Neoprobe was used to identify the right axillary sentinel node.  3 cm incision made the right axilla.  Dissection carried down in 3 hot sentinel nodes identified in the level 1 axillary basin.  These were removed with background counts approaching 0.  The long thoracic nerve, thoracodorsal trunk and the axillary vein were preserved.  Hemostasis achieved.  Wound closed with 3-0 Vicryl and 4-0 Monocryl.  Dermabond applied to both incisions.  All counts were correct.  Breast binder placed.  The patient was awoke extubated taken recovery in satisfactory condition.

## 2019-01-27 NOTE — Anesthesia Postprocedure Evaluation (Signed)
Anesthesia Post Note  Patient: Honore Pense  Procedure(s) Performed: RIGHT BREAST LUMPECTOMY WITH RADIOACTIVE SEED AND SENTINEL LYMPH NODE MAPPING (Right Breast) INSERTION PORT-A-CATH WITH ULTRASOUND (Right Chest)     Patient location during evaluation: PACU Anesthesia Type: General Level of consciousness: awake and alert Pain management: pain level controlled Vital Signs Assessment: post-procedure vital signs reviewed and stable Respiratory status: spontaneous breathing, nonlabored ventilation, respiratory function stable and patient connected to nasal cannula oxygen Cardiovascular status: blood pressure returned to baseline and stable Postop Assessment: no apparent nausea or vomiting Anesthetic complications: no    Last Vitals:  Vitals:   01/27/19 1400 01/27/19 1415  BP: 127/81 118/85  Pulse: 90 79  Resp: 14 11  Temp:    SpO2: 100% 99%    Last Pain:  Vitals:   01/27/19 1400  TempSrc:   PainSc: 4                  Jaiona Simien

## 2019-01-27 NOTE — Anesthesia Procedure Notes (Signed)
Anesthesia Regional Block: Pectoralis block   Pre-Anesthetic Checklist: ,, timeout performed, Correct Patient, Correct Site, Correct Laterality, Correct Procedure, Correct Position, site marked, Risks and benefits discussed,  Surgical consent,  Pre-op evaluation,  At surgeon's request and post-op pain management  Laterality: Left  Prep: chloraprep       Needles:  Injection technique: Single-shot  Needle Type: Echogenic Stimulator Needle     Needle Length: 5cm  Needle Gauge: 22     Additional Needles:   Procedures:, nerve stimulator,,, ultrasound used (permanent image in chart),,,,  Narrative:  Start time: 01/27/2019 10:15 AM End time: 01/27/2019 10:20 AM Injection made incrementally with aspirations every 5 mL.  Performed by: Personally  Anesthesiologist: Janeece Riggers, MD  Additional Notes: Functioning IV was confirmed and monitors were applied.  A 28mm 22ga Arrow echogenic stimulator needle was used. Sterile prep and drape,hand hygiene and sterile gloves were used. Ultrasound guidance: relevant anatomy identified, needle position confirmed, local anesthetic spread visualized around nerve(s)., vascular puncture avoided.  Image printed for medical record. Negative aspiration and negative test dose prior to incremental administration of local anesthetic. The patient tolerated the procedure well.

## 2019-01-27 NOTE — Interval H&P Note (Signed)
History and Physical Interval Note:  01/27/2019 10:53 AM  Alicia Hahn  has presented today for surgery, with the diagnosis of RIGHT BREAST CANCER.  The various methods of treatment have been discussed with the patient and family. After consideration of risks, benefits and other options for treatment, the patient has consented to  Procedure(s): RIGHT BREAST LUMPECTOMY WITH RADIOACTIVE SEED AND SENTINEL LYMPH NODE MAPPING (Right) INSERTION PORT-A-CATH WITH ULTRASOUND (N/A) as a surgical intervention.  The patient's history has been reviewed, patient examined, no change in status, stable for surgery.  I have reviewed the patient's chart and labs.  Questions were answered to the patient's satisfaction.     [place]

## 2019-01-27 NOTE — Transfer of Care (Signed)
Immediate Anesthesia Transfer of Care Note  Patient: Alicia Hahn  Procedure(s) Performed: RIGHT BREAST LUMPECTOMY WITH RADIOACTIVE SEED AND SENTINEL LYMPH NODE MAPPING (Right Breast) INSERTION PORT-A-CATH WITH ULTRASOUND (Right Chest)  Patient Location: PACU  Anesthesia Type:GA combined with regional for post-op pain  Level of Consciousness: awake, alert , oriented and patient cooperative  Airway & Oxygen Therapy: Patient Spontanous Breathing  Post-op Assessment: Report given to RN and Post -op Vital signs reviewed and stable  Post vital signs: Reviewed and stable  Last Vitals:  Vitals Value Taken Time  BP 130/81 01/27/19 1335  Temp    Pulse 95 01/27/19 1338  Resp 14 01/27/19 1338  SpO2 94 % 01/27/19 1338  Vitals shown include unvalidated device data.  Last Pain:  Vitals:   01/27/19 0946  TempSrc: Oral  PainSc: 0-No pain      Patients Stated Pain Goal: 3 ([id number])  Complications: No apparent anesthesia complications

## 2019-01-27 NOTE — Discharge Instructions (Signed)
[place] Office Phone Number [phone]      BREAST BIOPSY/ PARTIAL MASTECTOMY: POST OP INSTRUCTIONS  Always review your discharge instruction sheet given to you by the facility where your surgery was performed.  IF YOU HAVE DISABILITY OR FAMILY LEAVE FORMS, YOU MUST BRING THEM TO THE OFFICE FOR PROCESSING.  DO NOT GIVE THEM TO YOUR DOCTOR.  1. A prescription for pain medication may be given to you upon discharge.  Take your pain medication as prescribed, if needed.  If narcotic pain medicine is not needed, then you may take acetaminophen (Tylenol) or ibuprofen (Advil) as needed. 2. Take your usually prescribed medications unless otherwise directed 3. If you need a refill on your pain medication, please contact your pharmacy.  They will contact our office to request authorization.  Prescriptions will not be filled after 5pm or on week-ends. 4. You should eat very light the first 24 hours after surgery, such as soup, crackers, pudding, etc.  Resume your normal diet the day after surgery. 5. Most patients will experience some swelling and bruising in the breast.  Ice packs and a good support bra will help.  Swelling and bruising can take several days to resolve.  6. It is common to experience some constipation if taking pain medication after surgery.  Increasing fluid intake and taking a stool softener will usually help or prevent this problem from occurring.  A mild laxative (Milk of Magnesia or Miralax) should be taken according to package directions if there are no bowel movements after 48 hours. 7. Unless discharge instructions indicate otherwise, you may remove your bandages 24-48 hours after surgery, and you may shower at that time.  You may have steri-strips (small skin tapes) in place directly over the incision.  These strips should be left on the skin for 7-10 days.  If your surgeon used skin glue on the incision, you may shower in 24 hours.  The glue will flake off  over the next 2-3 weeks.  Any sutures or staples will be removed at the office during your follow-up visit. 8. ACTIVITIES:  You may resume regular daily activities (gradually increasing) beginning the next day.  Wearing a good support bra or sports bra minimizes pain and swelling.  You may have sexual intercourse when it is comfortable. a. You may drive when you no longer are taking prescription pain medication, you can comfortably wear a seatbelt, and you can safely maneuver your car and apply brakes. b. RETURN TO WORK:  ______________________________________________________________________________________ 9. You should see your doctor in the office for a follow-up appointment approximately two weeks after your surgery.  Your doctors nurse will typically make your follow-up appointment when she calls you with your pathology report.  Expect your pathology report 2-3 business days after your surgery.  You may call to check if you do not hear from [place] after three days. 10. OTHER INSTRUCTIONS: _______________________________________________________________________________________________ _____________________________________________________________________________________________________________________________________ _____________________________________________________________________________________________________________________________________ _____________________________________________________________________________________________________________________________________  WHEN TO CALL YOUR DOCTOR: 1. Fever over 101.0 2. Nausea and/or vomiting. 3. Extreme swelling or bruising. 4. Continued bleeding from incision. 5. Increased pain, redness, or drainage from the incision.  The clinic staff is available to answer your questions during regular business hours.  Please dont hesitate to call and ask to speak to one of the nurses for clinical concerns.  If you have a medical emergency, go to the  nearest emergency room or call 911.  A surgeon from [facility] is always on call at the hospital.  For further questions,  please visit [email or web address]      PORT-A-CATH: POST OP INSTRUCTIONS  Always review your discharge instruction sheet given to you by the facility where your surgery was performed.   1. A prescription for pain medication may be given to you upon discharge. Take your pain medication as prescribed, if needed. If narcotic pain medicine is not needed, then you make take acetaminophen (Tylenol) or ibuprofen (Advil) as needed.  2. Take your usually prescribed medications unless otherwise directed. 3. If you need a refill on your pain medication, please contact our office. All narcotic pain medicine now requires a paper prescription.  Phoned in and fax refills are no longer allowed by law.  Prescriptions will not be filled after 5 pm or on weekends.  4. You should follow a light diet for the remainder of the day after your procedure. 5. Most patients will experience some mild swelling and/or bruising in the area of the incision. It may take several days to resolve. 6. It is common to experience some constipation if taking pain medication after surgery. Increasing fluid intake and taking a stool softener (such as Colace) will usually help or prevent this problem from occurring. A mild laxative (Milk of Magnesia or Miralax) should be taken according to package directions if there are no bowel movements after 48 hours.  7. Unless discharge instructions indicate otherwise, you may remove your bandages 48 hours after surgery, and you may shower at that time. You may have steri-strips (small white skin tapes) in place directly over the incision.  These strips should be left on the skin for 7-10 days.  If your surgeon used Dermabond (skin glue) on the incision, you may shower in 24 hours.  The glue will flake off over the next 2-3 weeks.  8. If your port is left  accessed at the end of surgery (needle left in port), the dressing cannot get wet and should only by changed by a healthcare professional. When the port is no longer accessed (when the needle has been removed), follow step 7.   9. ACTIVITIES:  Limit activity involving your arms for the next 72 hours. Do no strenuous exercise or activity for 1 week. You may drive when you are no longer taking prescription pain medication, you can comfortably wear a seatbelt, and you can maneuver your car. 10.You may need to see your doctor in the office for a follow-up appointment.  Please       check with your doctor.  11.When you receive a new Port-a-Cath, you will get a product guide and        ID card.  Please keep them in case you need them.  WHEN TO CALL YOUR DOCTOR [phone]): 1. Fever over 101.0 2. Chills 3. Continued bleeding from incision 4. Increased redness and tenderness at the site 5. Shortness of breath, difficulty breathing   The clinic staff is available to answer your questions during regular business hours. Please dont hesitate to call and ask to speak to one of the nurses or medical assistants for clinical concerns. If you have a medical emergency, go to the nearest emergency room or call 911.  A surgeon from [facility] is always on call at the hospital.     For further information, please visit [email or web address]    Post Anesthesia Home Care Instructions  Activity: Get plenty of rest for the remainder of the day. A responsible individual must stay with you for 24 hours following the procedure.  For the next  24 hours, DO NOT: -Drive a car -Paediatric nurse -Drink alcoholic beverages -Take any medication unless instructed by your physician -Make any legal decisions or sign important papers.  Meals: Start with liquid foods such as gelatin or soup. Progress to regular foods as tolerated. Avoid greasy, spicy, heavy foods. If nausea and/or vomiting  occur, drink only clear liquids until the nausea and/or vomiting subsides. Call your physician if vomiting continues.  Special Instructions/Symptoms: Your throat may feel dry or sore from the anesthesia or the breathing tube placed in your throat during surgery. If this causes discomfort, gargle with warm salt water. The discomfort should disappear within 24 hours.  If you had a scopolamine patch placed behind your ear for the management of post- operative nausea and/or vomiting:  1. The medication in the patch is effective for 72 hours, after which it should be removed.  Wrap patch in a tissue and discard in the trash. Wash hands thoroughly with soap and water. 2. You may remove the patch earlier than 72 hours if you experience unpleasant side effects which may include dry mouth, dizziness or visual disturbances. 3. Avoid touching the patch. Wash your hands with soap and water after contact with the patch.   Information for Discharge Teaching: EXPAREL (bupivacaine liposome injectable suspension)   Your surgeon or anesthesiologist gave you EXPAREL(bupivacaine) to help control your pain after surgery.   EXPAREL is a local anesthetic that provides pain relief by numbing the tissue around the surgical site.  EXPAREL is designed to release pain medication over time and can control pain for up to 72 hours.  Depending on how you respond to EXPAREL, you may require less pain medication during your recovery.  Possible side effects:  Temporary loss of sensation or ability to move in the area where bupivacaine was injected.  Nausea, vomiting, constipation  Rarely, numbness and tingling in your mouth or lips, lightheadedness, or anxiety may occur.  Call your doctor right away if you think you may be experiencing any of these sensations, or if you have other questions regarding possible side effects.  Follow all other discharge instructions given to you by your surgeon or nurse. Eat a healthy  diet and drink plenty of water or other fluids.  If you return to the hospital for any reason within 96 hours following the administration of EXPAREL, it is important for health care providers to know that you have received this anesthetic. A teal colored band has been placed on your arm with the date, time and amount of EXPAREL you have received in order to alert and inform your health care providers. Please leave this armband in place for the full 96 hours following administration, and then you may remove the band.    No tylenol until after 4pm today.

## 2019-01-28 ENCOUNTER — Encounter: Payer: Self-pay | Admitting: *Deleted

## 2019-01-29 ENCOUNTER — Other Ambulatory Visit: Payer: Self-pay

## 2019-01-29 ENCOUNTER — Emergency Department (HOSPITAL_COMMUNITY)
Admission: EM | Admit: 2019-01-29 | Discharge: 2019-01-29 | Disposition: A | Discharge status: Home / Self Care | Payer: 59 | Attending: Emergency Medicine | Admitting: Emergency Medicine

## 2019-01-29 ENCOUNTER — Ambulatory Visit (HOSPITAL_COMMUNITY)
Admission: EM | Admit: 2019-01-29 | Discharge: 2019-01-29 | Disposition: A | Discharge status: Home / Self Care | Payer: 59

## 2019-01-29 ENCOUNTER — Encounter (HOSPITAL_COMMUNITY): Payer: Self-pay | Admitting: Emergency Medicine

## 2019-01-29 ENCOUNTER — Emergency Department (HOSPITAL_COMMUNITY): Payer: 59

## 2019-01-29 DIAGNOSIS — R1013 Epigastric pain: Secondary | ICD-10-CM

## 2019-01-29 DIAGNOSIS — Z87891 Personal history of nicotine dependence: Secondary | ICD-10-CM | POA: Diagnosis not present

## 2019-01-29 DIAGNOSIS — K2900 Acute gastritis without bleeding: Secondary | ICD-10-CM | POA: Diagnosis not present

## 2019-01-29 DIAGNOSIS — Z853 Personal history of malignant neoplasm of breast: Secondary | ICD-10-CM | POA: Insufficient documentation

## 2019-01-29 DIAGNOSIS — Z79899 Other long term (current) drug therapy: Secondary | ICD-10-CM | POA: Insufficient documentation

## 2019-01-29 LAB — COMPREHENSIVE METABOLIC PANEL
ALT: 33 U/L (ref 0–44)
AST: 26 U/L (ref 15–41)
Albumin: 4.1 g/dL (ref 3.5–5.0)
Alkaline Phosphatase: 54 U/L (ref 38–126)
Anion gap: 8 (ref 5–15)
BUN: 17 mg/dL (ref 6–20)
CO2: 26 mmol/L (ref 22–32)
Calcium: 9.7 mg/dL (ref 8.9–10.3)
Chloride: 103 mmol/L (ref 98–111)
Creatinine, Ser: 0.78 mg/dL (ref 0.44–1.00)
GFR calc Af Amer: >60 mL/min (ref >60)
GFR calc non Af Amer: >60 mL/min (ref >60)
Glucose, Bld: 107 mg/dL — ABNORMAL HIGH (ref 70–99)
Potassium: 3.9 mmol/L (ref 3.5–5.1)
Sodium: 137 mmol/L (ref 135–145)
Total Bilirubin: 0.6 mg/dL (ref 0.3–1.2)
Total Protein: 6.6 g/dL (ref 6.5–8.1)

## 2019-01-29 LAB — I-STAT BETA HCG BLOOD, ED (MC, WL, AP ONLY): I-stat hCG, quantitative: <5 m[IU]/mL (ref <5)

## 2019-01-29 LAB — LIPASE, BLOOD: Lipase: 33 U/L (ref 11–51)

## 2019-01-29 LAB — CBC
HCT: 42.8 % (ref 36.0–46.0)
Hemoglobin: 14.2 g/dL (ref 12.0–15.0)
MCH: 31.1 pg (ref 26.0–34.0)
MCHC: 33.2 g/dL (ref 30.0–36.0)
MCV: 93.7 fL (ref 80.0–100.0)
Platelets: 214 10*3/uL (ref 150–400)
RBC: 4.57 MIL/uL (ref 3.87–5.11)
RDW: 12.3 % (ref 11.5–15.5)
WBC: 7.6 10*3/uL (ref 4.0–10.5)
nRBC: 0 % (ref 0.0–0.2)

## 2019-01-29 LAB — URINALYSIS, ROUTINE W REFLEX MICROSCOPIC
Bilirubin Urine: NEGATIVE
Glucose, UA: NEGATIVE mg/dL
Hgb urine dipstick: NEGATIVE
Ketones, ur: NEGATIVE mg/dL
Leukocytes,Ua: NEGATIVE
Nitrite: NEGATIVE
Protein, ur: NEGATIVE mg/dL
Specific Gravity, Urine: 1.017 (ref 1.005–1.030)
pH: 7 (ref 5.0–8.0)

## 2019-01-29 MED ORDER — IOHEXOL 300 MG/ML  SOLN
100.0000 mL | Freq: Once | INTRAMUSCULAR | Status: AC | PRN
  Administered 2019-01-29: 100 mL via INTRAVENOUS

## 2019-01-29 MED ORDER — ONDANSETRON HCL 4 MG/2ML IJ SOLN
4.0000 mg | Freq: Once | INTRAMUSCULAR | Status: AC
  Administered 2019-01-29: 4 mg via INTRAVENOUS
  Filled 2019-01-29: qty 2

## 2019-01-29 MED ORDER — MORPHINE SULFATE (PF) 4 MG/ML IV SOLN
4.0000 mg | Freq: Once | INTRAVENOUS | Status: AC
  Administered 2019-01-29: 4 mg via INTRAVENOUS
  Filled 2019-01-29: qty 1

## 2019-01-29 MED ORDER — PANTOPRAZOLE SODIUM 20 MG PO TBEC: 40.0000 mg | DELAYED_RELEASE_TABLET | Freq: Two times a day (BID) | ORAL | 0 refills | Status: DC

## 2019-01-29 MED ORDER — ALUM & MAG HYDROXIDE-SIMETH 200-200-20 MG/5ML PO SUSP
30.0000 mL | Freq: Once | ORAL | Status: AC
  Administered 2019-01-29: 30 mL via ORAL
  Filled 2019-01-29: qty 30

## 2019-01-29 MED ORDER — HYDROMORPHONE HCL 1 MG/ML IJ SOLN
1.0000 mg | Freq: Once | INTRAMUSCULAR | Status: AC
  Administered 2019-01-29: 1 mg via INTRAVENOUS
  Filled 2019-01-29: qty 1

## 2019-01-29 MED ORDER — SODIUM CHLORIDE 0.9% FLUSH
3.0000 mL | Freq: Once | INTRAVENOUS | Status: AC
  Administered 2019-01-29: 3 mL via INTRAVENOUS

## 2019-01-29 MED ORDER — LIDOCAINE VISCOUS HCL 2 % MT SOLN
15.0000 mL | Freq: Once | OROMUCOSAL | Status: AC
  Administered 2019-01-29: 14:00:00 15 mL via ORAL
  Filled 2019-01-29: qty 15

## 2019-01-29 MED ORDER — ONDANSETRON 4 MG PO TBDP: 4.0000 mg | ORAL_TABLET | Freq: Three times a day (TID) | ORAL | 0 refills | Status: DC | PRN

## 2019-01-29 MED ORDER — SODIUM CHLORIDE 0.9 % IV BOLUS
1000.0000 mL | Freq: Once | INTRAVENOUS | Status: AC
  Administered 2019-01-29: 1000 mL via INTRAVENOUS

## 2019-01-29 MED ORDER — PANTOPRAZOLE SODIUM 40 MG PO TBEC
40.0000 mg | DELAYED_RELEASE_TABLET | Freq: Once | ORAL | Status: AC
  Administered 2019-01-29: 40 mg via ORAL
  Filled 2019-01-29: qty 1

## 2019-01-29 NOTE — ED Notes (Signed)
Patient verbalizes understanding of discharge instructions. Opportunity for questioning and answers were provided. Armband removed by staff, pt discharged from ED. Ambulatory by self

## 2019-01-29 NOTE — ED Triage Notes (Signed)
C/o severe upper abd pain (worse on R side) with nausea.  Pt writhing in pain.  Reports R sided lumpectomy and portacath placement 2 days ago.

## 2019-01-29 NOTE — ED Triage Notes (Signed)
Per pt and spouse; pt is 2 days post-op for breast cancer surgery.  Pt is writhing and doubled over in pain with tearful eyes due to abd pain.  Discussed with providers.  Per Dr Alphonzo Cruise, pt needs to be seen in ED. Pt and spouse verbalized understanding.

## 2019-01-29 NOTE — ED Provider Notes (Signed)
[facility] EMERGENCY DEPARTMENT Provider Note   CSN: [id number] Arrival date & time: 01/29/19  1015     History Chief Complaint  Patient presents with  . Abdominal Pain    Alicia Hahn is a 59 y.o. female.  HPI     59 year old female with a history of breast cancer with lumpectomy, radioactive seed, sentinel lymph node biopsy as well as Port-A-Cath placement 2 days ago, presents with concern for severe abdominal pain.  Reports that she woke up around 3 AM with severe epigastric abdominal pain, and now the pain has become diffuse.  No exacerbating or relieving factors.  She initially had felt okay after her recent surgery, did have some ibuprofen last night for pain which she took last night with food.  She reports nausea, but no vomiting.  Reports normal bowel movements, including normal bowel movement this morning, no constipation or diarrhea.  Denies urinary symptoms, fever, chest pain, cough, or shortness of breath. (other than some soreness at portacath site. No prior hx of abdominal surgeries.   Past Medical History:  Diagnosis Date  . Cancer [facility])    breast cancer    Patient Active Problem List   Diagnosis Date Noted  . Genetic testing 12/30/2018  . Malignant neoplasm of upper-outer quadrant of right breast in female, estrogen receptor positive ([place]) 12/28/2018  . Family history of breast cancer in sister , and mother 11/02/2018  . Plantar fasciitis of left foot 11/02/2018  . Muscle ache of extremity-bilateral calf muscles 11/02/2018  . Impingement syndrome of right shoulder 05/11/2017    Past Surgical History:  Procedure Laterality Date  . BREAST LUMPECTOMY WITH RADIOACTIVE SEED AND SENTINEL LYMPH NODE BIOPSY Right 01/27/2019   Procedure: RIGHT BREAST LUMPECTOMY WITH RADIOACTIVE SEED AND SENTINEL LYMPH NODE MAPPING;  Surgeon: Erroll Luna, MD;  Location: [facility];  Service: General;  Laterality: Right;  . COSMETIC SURGERY  Bilateral 2000   breast aug/ lipo  . PORTACATH PLACEMENT Right 01/27/2019   Procedure: INSERTION PORT-A-CATH WITH ULTRASOUND;  Surgeon: Erroll Luna, MD;  Location: [facility];  Service: General;  Laterality: Right;     OB History   No obstetric history on file.     Family History  Problem Relation Age of Onset  . Diabetes Mother   . Heart disease Mother   . Hyperlipidemia Mother   . Breast cancer Mother 19  . Cancer Mother        breast DCIS  . Cancer Father 4       prostate cancer   . Diabetes Father   . Breast cancer Sister 41  . Cancer Sister 32       breast cancer   . Cancer Maternal Aunt 55       breast cancer   . Cancer Maternal Grandfather        colon cancer    Social History   Tobacco Use  . Smoking status: Former Smoker    Packs/day: 0.25    Years: 5.00    Pack years: 1.25    Quit date: 01/20/1978    Years since quitting: 41.0  . Smokeless tobacco: Never Used  . Tobacco comment: 1982  Substance Use Topics  . Alcohol use: Yes    Alcohol/week: 6.0 standard drinks    Types: 6 Glasses of wine per week  . Drug use: No    Home Medications Prior to Admission medications   Medication Sig Start Date End Date Taking?  Authorizing Provider  ALPRAZolam (XANAX) 0.25 MG tablet Take 0.25 mg by mouth 3 (three) times daily as needed. 12/31/18  Yes [provider]  CALCIUM PO Take 1,000 mg by mouth daily.   Yes [provider]  HYDROcodone-acetaminophen (NORCO/VICODIN) 5-325 MG tablet Take 1 tablet by mouth every 6 (six) hours as needed for moderate pain. 01/27/19  Yes Cornett, Marcello Moores, MD  ibuprofen (ADVIL) 800 MG tablet Take 1 tablet (800 mg total) by mouth every 8 (eight) hours as needed. Patient taking differently: Take 800 mg by mouth every 8 (eight) hours as needed for moderate pain.  01/27/19  Yes Cornett, Marcello Moores, MD  Magnesium 125 MG CAPS Take 1 capsule by mouth daily.    Yes [provider]  Melatonin 3 MG CAPS Take 3 mg  by mouth at bedtime.    Yes [provider]  Multiple Vitamins-Minerals (MULTIVITAMIN WITH MINERALS) tablet Take 1 tablet by mouth daily.   Yes [provider]  VITAMIN D PO Take 12,000 Units by mouth daily.   Yes [provider]  ondansetron (ZOFRAN ODT) 4 MG disintegrating tablet Take 1 tablet (4 mg total) by mouth every 8 (eight) hours as needed for nausea or vomiting. 01/29/19   Gareth Morgan, MD  pantoprazole (PROTONIX) 20 MG tablet Take 2 tablets (40 mg total) by mouth 2 (two) times daily for 14 days. 01/29/19 02/12/19  Gareth Morgan, MD    Allergies    Patient has no known allergies.  Review of Systems   Review of Systems  Constitutional: Negative for fever.  HENT: Negative for sore throat.   Eyes: Negative for visual disturbance.  Respiratory: Negative for cough and shortness of breath.   Cardiovascular: Negative for chest pain.  Gastrointestinal: Positive for abdominal pain and nausea. Negative for constipation, diarrhea and vomiting.  Genitourinary: Negative for difficulty urinating.  Musculoskeletal: Negative for back pain and neck pain.  Skin: Positive for wound (recent lumpectomy/portacath). Negative for rash.  Neurological: Negative for syncope and headaches.    Physical Exam Updated Vital Signs BP (!) 165/84   Pulse 63   Temp 98.1 F (36.7 C) (Oral)   Resp 12   SpO2 100%   Physical Exam Vitals and nursing note reviewed.  Constitutional:      General: She is not in acute distress.    Appearance: She is well-developed. She is ill-appearing (appears uncomfortable). She is not diaphoretic.  HENT:     Head: Normocephalic and atraumatic.  Eyes:     Conjunctiva/sclera: Conjunctivae normal.  Cardiovascular:     Rate and Rhythm: Normal rate and regular rhythm.     Heart sounds: Normal heart sounds. No murmur. No friction rub. No gallop.   Pulmonary:     Effort: Pulmonary effort is normal. No respiratory distress.     Breath sounds:  Normal breath sounds. No wheezing or rales.  Abdominal:     General: There is no distension.     Palpations: Abdomen is soft.     Tenderness: There is generalized abdominal tenderness and tenderness in the right upper quadrant, epigastric area, left upper quadrant and left lower quadrant. There is no guarding. Positive signs include Murphy's sign. Negative signs include McBurney's sign and psoas sign.  Musculoskeletal:        General: No tenderness.     Cervical back: Normal range of motion.  Skin:    General: Skin is warm and dry.     Findings: No erythema or rash.  Neurological:  Mental Status: She is alert and oriented to person, place, and time.     ED Results / Procedures / Treatments   Labs (all labs ordered are listed, but only abnormal results are displayed) Labs Reviewed  COMPREHENSIVE METABOLIC PANEL - Abnormal; Notable for the following components:      Result Value   Glucose, Bld 107 (*)    All other components within normal limits  URINALYSIS, ROUTINE W REFLEX MICROSCOPIC - Abnormal; Notable for the following components:   APPearance CLOUDY (*)    All other components within normal limits  LIPASE, BLOOD  CBC  I-STAT BETA HCG BLOOD, ED ([facility], [facility], [facility] ONLY)    EKG EKG Interpretation  Date/Time:  Saturday January 29 2019 10:23:04 EST Ventricular Rate:  69 PR Interval:  162 QRS Duration: 72 QT Interval:  382 QTC Calculation: 409 R Axis:   87 Text Interpretation: Normal sinus rhythm Nonspecific T wave abnormality Abnormal ECG No significant change since last tracing Confirmed by Gareth Morgan [phone]) on 01/29/2019 1:34:08 PM   Radiology CT ABDOMEN PELVIS W CONTRAST  Result Date: 01/29/2019 CLINICAL DATA:  Acute abdominal pain EXAM: CT ABDOMEN AND PELVIS WITH CONTRAST TECHNIQUE: Multidetector CT imaging of the abdomen and pelvis was performed using the standard protocol following bolus administration of intravenous contrast. CONTRAST:  157mL OMNIPAQUE IOHEXOL  300 MG/ML  SOLN COMPARISON:  None. FINDINGS: Lower chest: No acute abnormality. Hepatobiliary: A 1.9 cm cyst is seen in the right hepatic lobe. No gallstones, gallbladder wall thickening, or biliary dilatation. Pancreas: Unremarkable. No pancreatic ductal dilatation or surrounding inflammatory changes. Spleen: Normal in size without focal abnormality. Adrenals/Urinary Tract: Adrenal glands are unremarkable. Other than a 3 mm nonobstructive calculus in the right kidney, the kidneys are normal, without renal calculi, focal lesion, or hydronephrosis. Bladder is unremarkable. Stomach/Bowel: The wall of the gastric antrum measures up to 1.3 cm in thickness. Appendix appears normal. No evidence of bowel wall thickening, distention, or inflammatory changes. Vascular/Lymphatic: Aortic atherosclerosis. No enlarged abdominal or pelvic lymph nodes. Reproductive: Uterus and bilateral adnexa are unremarkable. Other: No abdominal wall hernia or abnormality. No abdominopelvic ascites. Musculoskeletal: No acute or significant osseous findings. IMPRESSION: 1. Wall thickening of the gastric antrum may represent gastritis. Aortic Atherosclerosis (ICD10-I70.0). Electronically Signed   By: Zerita Boers M.D.   On: 01/29/2019 13:26   DG Chest Port 1 View  Result Date: 01/27/2019 CLINICAL DATA:  Port placement. EXAM: PORTABLE CHEST 1 VIEW COMPARISON:  Chest x-ray dated Jun 12, 2003. FINDINGS: Right chest wall port catheter with tip at the cavoatrial junction. The heart size and mediastinal contours are within normal limits. Normal pulmonary vascularity. No focal consolidation, pleural effusion, or pneumothorax. No acute osseous abnormality. Postsurgical clips and subcutaneous emphysema in the right axilla. Small amount of subcutaneous emphysema in the right neck. IMPRESSION: 1. Right chest wall port catheter without complicating feature. 2. No active disease. Electronically Signed   By: Titus Dubin M.D.   On: 01/27/2019 14:28     Procedures Procedures (including critical care time)  Medications Ordered in ED Medications  alum & mag hydroxide-simeth (MAALOX/MYLANTA) 200-200-20 MG/5ML suspension 30 mL (has no administration in time range)    And  lidocaine (XYLOCAINE) 2 % viscous mouth solution 15 mL (has no administration in time range)  pantoprazole (PROTONIX) EC tablet 40 mg (has no administration in time range)  sodium chloride flush (NS) 0.9 % injection 3 mL (3 mLs Intravenous Given 01/29/19 1136)  morphine 4 MG/ML injection 4 mg (4  mg Intravenous Given 01/29/19 1133)  ondansetron (ZOFRAN) injection 4 mg (4 mg Intravenous Given 01/29/19 1133)  sodium chloride 0.9 % bolus 1,000 mL (1,000 mLs Intravenous New Bag/Given 01/29/19 1136)  HYDROmorphone (DILAUDID) injection 1 mg (1 mg Intravenous Given 01/29/19 1224)  iohexol (OMNIPAQUE) 300 MG/ML solution 100 mL (100 mLs Intravenous Contrast Given 01/29/19 1249)    ED Course  I have reviewed the triage vital signs and the nursing notes.  Pertinent labs & imaging results that were available during my care of the patient were reviewed by me and considered in my medical decision making (see chart for details).    MDM Rules/Calculators/A&P                      59 year old female with a history of breast cancer with lumpectomy, radioactive seed, sentinel lymph node biopsy as well as Port-A-Cath placement 2 days ago, presents with concern for severe abdominal pain.  DDx includes cholecystitis, cholelithiasis, pancreatitis, PUD, perforated viscous, appendicitis, SBO. No dyspnea and has abdominal pain, overall doubt PE/pneumothorax or intrathoracic etiology.  Labs show no panretatitis or hepatitis. Given morphine, zofran.   CT abdomen pelvis shows wall thickening of the gastric antrum which may represent gastritis. Given epigastric abdominal pain with tenderness and nausea do feel gastritis and/or PUD secondary to recent NSAID use likely etiology of symptoms. Given GI cocktail  and protonix. Recommend protonix rx, recommend avoidance of NSAIDs, PCP follow up, consideration of GI follow up if not improving.     Final Clinical Impression(s) / ED Diagnoses Final diagnoses:  Epigastric abdominal pain  Acute gastritis without hemorrhage, unspecified gastritis type    Rx / DC Orders ED Discharge Orders         Ordered    pantoprazole (PROTONIX) 20 MG tablet  2 times daily     01/29/19 1348    ondansetron (ZOFRAN ODT) 4 MG disintegrating tablet  Every 8 hours PRN     01/29/19 1348           Gareth Morgan, MD 01/29/19 1419

## 2019-01-31 ENCOUNTER — Other Ambulatory Visit: Payer: Self-pay

## 2019-01-31 DIAGNOSIS — C50411 Malignant neoplasm of upper-outer quadrant of right female breast: Secondary | ICD-10-CM | POA: Diagnosis not present

## 2019-02-01 ENCOUNTER — Other Ambulatory Visit: Payer: Self-pay

## 2019-02-01 ENCOUNTER — Encounter: Payer: Self-pay | Admitting: *Deleted

## 2019-02-01 DIAGNOSIS — Z0000 Encounter for general adult medical examination without abnormal findings: Secondary | ICD-10-CM

## 2019-02-01 LAB — SURGICAL PATHOLOGY

## 2019-02-02 ENCOUNTER — Other Ambulatory Visit: Payer: 59

## 2019-02-02 ENCOUNTER — Encounter: Payer: Self-pay | Admitting: Family Medicine

## 2019-02-02 ENCOUNTER — Ambulatory Visit (INDEPENDENT_AMBULATORY_CARE_PROVIDER_SITE_OTHER): Payer: 59 | Admitting: Family Medicine

## 2019-02-02 ENCOUNTER — Encounter: Payer: Self-pay | Admitting: Gastroenterology

## 2019-02-02 ENCOUNTER — Other Ambulatory Visit: Payer: Self-pay

## 2019-02-02 VITALS — BP 158/83 | HR 86 | Temp 98.0°F | Resp 12 | Ht 63.0 in | Wt 171.2 lb

## 2019-02-02 DIAGNOSIS — Z0000 Encounter for general adult medical examination without abnormal findings: Secondary | ICD-10-CM

## 2019-02-02 DIAGNOSIS — M5412 Radiculopathy, cervical region: Secondary | ICD-10-CM | POA: Insufficient documentation

## 2019-02-02 DIAGNOSIS — F5104 Psychophysiologic insomnia: Secondary | ICD-10-CM | POA: Insufficient documentation

## 2019-02-02 DIAGNOSIS — F4329 Adjustment disorder with other symptoms: Secondary | ICD-10-CM | POA: Insufficient documentation

## 2019-02-02 DIAGNOSIS — C50411 Malignant neoplasm of upper-outer quadrant of right female breast: Secondary | ICD-10-CM

## 2019-02-02 DIAGNOSIS — Z1211 Encounter for screening for malignant neoplasm of colon: Secondary | ICD-10-CM

## 2019-02-02 DIAGNOSIS — Z719 Counseling, unspecified: Secondary | ICD-10-CM

## 2019-02-02 DIAGNOSIS — R200 Anesthesia of skin: Secondary | ICD-10-CM | POA: Diagnosis not present

## 2019-02-02 DIAGNOSIS — R1013 Epigastric pain: Secondary | ICD-10-CM

## 2019-02-02 DIAGNOSIS — Z803 Family history of malignant neoplasm of breast: Secondary | ICD-10-CM

## 2019-02-02 DIAGNOSIS — Z170 Estrogen receptor positive status [ER+]: Secondary | ICD-10-CM

## 2019-02-02 DIAGNOSIS — R202 Paresthesia of skin: Secondary | ICD-10-CM

## 2019-02-02 MED ORDER — TRAZODONE HCL 50 MG PO TABS: 50.0000 mg | ORAL_TABLET | Freq: Every evening | ORAL | 0 refills | Status: DC | PRN

## 2019-02-02 NOTE — Progress Notes (Signed)
[name]   [phone] [phone]   Clinic Follow up Note   Patient Care Team: Opalski, Deborah, DO as PCP - General (Family Medicine) Blackman, Christopher Y, MD as Consulting Physician (Orthopedic Surgery) Holland, Richard, MD as Consulting Physician (Obstetrics and Gynecology) Cornett, Thomas, MD as Consulting Physician (General Surgery) Feng, Yan, MD as Consulting Physician (Hematology) Moody, John, MD as Consulting Physician (Radiation Oncology) Stuart, Dawn C, RN as Oncology Nurse Navigator Martini, Keisha N, RN as Oncology Nurse Navigator  Date of Service:  02/07/2019  CHIEF COMPLAINT: F/u of right breast cancer   SUMMARY OF ONCOLOGIC HISTORY: Oncology History Overview Note  Cancer Staging Malignant neoplasm of upper-outer quadrant of right breast in female, estrogen receptor positive (HCC) Staging form: Breast, AJCC 8th Edition - Clinical stage from 12/29/2018: Stage IA (cT1b, cN0, cM0, G2, ER+, PR+, HER2+) - Unsigned - Pathologic stage from 01/27/2019: Stage IA (pT1c, pN0, cM0, G3, ER+, PR+, HER2+) - Signed by Feng, Yan, MD on 02/06/2019 Cancer Staging No matching staging information was found for the patient.    Malignant neoplasm of upper-outer quadrant of right breast in female, estrogen receptor positive (HCC)  12/22/2018 Mammogram    Diagnostic Mammogram 12/22/18  IMPRESSION: 1. 9 mm mass, with associated calcifications and architectural distortion, in the 11 o'clock position of the right breast, highly suspicious for breast carcinoma. 2. Questionable mass noted in the inferior aspect of the right breast remains equivocal on diagnostic imaging may reflect focal fibroglandular tissue. There is no sonographic correlate.   12/23/2018 Initial Biopsy   Diagnosis 12/23/18 Breast, right, needle core biopsy, 11 o'clock position - INVASIVE DUCTAL CARCINOMA, SEE COMMENT. - DUCTAL CARCINOMA IN SITU WITH NECROSIS.   12/28/2018 Initial  Diagnosis   Malignant neoplasm of upper-outer quadrant of right breast in female, estrogen receptor positive (HCC)   01/13/2019 Imaging   MRI Breast 01/13/19  IMPRESSION: 1. Biopsy proven malignancy in the upper-outer right breast. No additional suspicious findings on the right. 2. No MRI evidence of malignancy on the left. 3. No suspicious lymphadenopathy.   01/27/2019 Surgery   RIGHT BREAST LUMPECTOMY WITH RADIOACTIVE SEED AND SENTINEL LYMPH NODE MAPPING and PAC placement by Dr Cornett  01/27/19   01/27/2019 Pathology Results   FINAL MICROSCOPIC DIAGNOSIS: 01/27/19  A. BREAST, RIGHT, LUMPECTOMY:  - Invasive ductal carcinoma, grade 3, spanning 1.3 cm.  - High grade ductal carcinoma in situ with necrosis.  - Biopsy site.  - Final resection margins are negative for carcinoma.  - See oncology table.   B. BREAST, RIGHT ADDITIONAL SUPERIOR MARGIN, EXCISION:  - Fibrocystic change and adenosis.  - No malignancy identified.   C. LYMPH NODE, RIGHT #1, SENTINEL, BIOPSY:  - One of one lymph nodes negative for carcinoma (0/1).   D. LYMPH NODE, RIGHT, SENTINEL, BIOPSY:  - One of one lymph nodes negative for carcinoma (0/1).   E. LYMPH NODE, RIGHT, SENTINEL, BIOPSY:  - One of one lymph nodes negative for carcinoma (0/1).   F. LYMPH NODE, RIGHT, SENTINEL, BIOPSY:  - One of one lymph nodes negative for carcinoma (0/1).       01/27/2019 Cancer Staging   Staging form: Breast, AJCC 8th Edition - Pathologic stage from 01/27/2019: Stage IA (pT1c, pN0, cM0, G3, ER+, PR+, HER2+) - Signed by Feng, Yan, MD on 02/06/2019   02/08/2019 Imaging   CT AP W Contrast 02/08/19  IMPRESSION: 1. Wall thickening of the gastric antrum may represent gastritis.   Aortic Atherosclerosis (ICD10-I70.0).        CURRENT THERAPY:  PENDING adjuvant weekly Taxol or Abraxane with Herceptin q3weeks starting in 2 weeks   INTERVAL HISTORY:  Alicia Hahn is here for a follow up after surgery. She presents to the  clinic alone. She notes her surgery went well. She notes when she tried Motrin post surgery she had significant gastritis since her surgery. Her pain is currently 2/10. She is taking Protonix which has helped. She plans to be followed by a GI Dr Beavers.      REVIEW OF SYSTEMS:   Constitutional: Denies fevers, chills or abnormal weight loss Eyes: Denies blurriness of vision Ears, nose, mouth, throat, and face: Denies mucositis or sore throat Respiratory: Denies cough, dyspnea or wheezes Cardiovascular: Denies palpitation, chest discomfort or lower extremity swelling Gastrointestinal:  Denies nausea, heartburn or change in bowel habits Skin: Denies abnormal skin rashes Lymphatics: Denies new lymphadenopathy or easy bruising Neurological:Denies numbness, tingling or new weaknesses Behavioral/Psych: Mood is stable, no new changes  All other systems were reviewed with the patient and are negative.  MEDICAL HISTORY:  Past Medical History:  Diagnosis Date  . Cancer (HCC)    breast cancer    SURGICAL HISTORY: Past Surgical History:  Procedure Laterality Date  . BREAST LUMPECTOMY WITH RADIOACTIVE SEED AND SENTINEL LYMPH NODE BIOPSY Right 01/27/2019   Procedure: RIGHT BREAST LUMPECTOMY WITH RADIOACTIVE SEED AND SENTINEL LYMPH NODE MAPPING;  Surgeon: Cornett, Thomas, MD;  Location: [facility];  Service: General;  Laterality: Right;  . COSMETIC SURGERY Bilateral 2000   breast aug/ lipo  . PORTACATH PLACEMENT Right 01/27/2019   Procedure: INSERTION PORT-A-CATH WITH ULTRASOUND;  Surgeon: Cornett, Thomas, MD;  Location: [facility];  Service: General;  Laterality: Right;    I have reviewed the social history and family history with the patient and they are unchanged from previous note.  ALLERGIES:  has No Known Allergies.  MEDICATIONS:  Current Outpatient Medications  Medication Sig Dispense Refill  . ALPRAZolam (XANAX) 0.25 MG tablet Take 0.25 mg by mouth 3  (three) times daily as needed.    . CALCIUM PO Take 1,000 mg by mouth daily.    . Magnesium 125 MG CAPS Take 1 capsule by mouth daily.     . Melatonin 3 MG CAPS Take 3 mg by mouth at bedtime.     . Multiple Vitamins-Minerals (MULTIVITAMIN WITH MINERALS) tablet Take 1 tablet by mouth daily.    . pantoprazole (PROTONIX) 20 MG tablet Take 2 tablets (40 mg total) by mouth 2 (two) times daily for 14 days. 56 tablet 0  . traZODone (DESYREL) 50 MG tablet Take 1-2 tablets (50-100 mg total) by mouth at bedtime as needed for sleep. 180 tablet 0  . VITAMIN D PO Take 12,000 Units by mouth daily.    . ondansetron (ZOFRAN ODT) 4 MG disintegrating tablet Take 1 tablet (4 mg total) by mouth every 8 (eight) hours as needed for nausea or vomiting. (Patient not taking: Reported on 02/02/2019) 20 tablet 0   No current facility-administered medications for this visit.    PHYSICAL EXAMINATION: ECOG PERFORMANCE STATUS: 1 - Symptomatic but completely ambulatory  Vitals:   02/07/19 1050  BP: 137/84  Pulse: 78  Resp: 18  Temp: 98 F (36.7 C)  SpO2: 100%   Filed Weights   02/07/19 1050  Weight: 173 lb 1.6 oz (78.5 kg)    GENERAL:alert, no distress and comfortable SKIN: skin color, texture, turgor are normal, no rashes or significant lesions (+) Left chest PAC,   healing well  EYES: normal, Conjunctiva are pink and non-injected, sclera clear  NECK: supple, thyroid normal size, non-tender, without nodularity LYMPH:  no palpable lymphadenopathy in the cervical, axillary  LUNGS: clear to auscultation and percussion with normal breathing effort HEART: regular rate & rhythm and no murmurs and no lower extremity edema ABDOMEN:abdomen soft, non-tender and normal bowel sounds Musculoskeletal:no cyanosis of digits and no clubbing  NEURO: alert & oriented x 3 with fluent speech, no focal motor/sensory deficits BREAST: s/p right lumpectomy: Surgical incisions are healing well with minimum scar tissue at the breast  incision and nipple retraction, moderate size (+) Seroma at axillary incision site, tender. No palpable mass, nodules or adenopathy bilaterally. Left breast exam benign.   LABORATORY DATA:  I have reviewed the data as listed CBC Latest Ref Rng & Units 02/02/2019 01/29/2019 01/25/2019  WBC 3.4 - 10.8 x10E3/uL 5.9 7.6 5.1  Hemoglobin 11.1 - 15.9 g/dL 14.5 14.2 14.7  Hematocrit 34.0 - 46.6 % 42.9 42.8 43.5  Platelets 150 - 450 x10E3/uL 196 214 201     CMP Latest Ref Rng & Units 02/02/2019 01/29/2019 01/25/2019  Glucose 65 - 99 mg/dL 106(H) 107(H) 100(H)  BUN 8 - 27 mg/dL _0 Creatinine 0.57 - 1.00 mg/dL 0.72 0.78 0.75  Sodium 134 - 144 mmol/L 142 137 141  Potassium 3.5 - 5.2 mmol/L 4.1 3.9 4.3  Chloride 96 - 106 mmol/L 103 103 106  CO2 20 - 29 mmol/L _1 Calcium 8.7 - 10.3 mg/dL 9.5 9.7 9.3  Total Protein 6.0 - 8.5 g/dL 6.9 6.6 6.4(L)  Total Bilirubin 0.0 - 1.2 mg/dL 0.3 0.6 0.6  Alkaline Phos 39 - 117 IU/L 75 54 57  AST 0 - 40 IU/L _2 ALT 0 - 32 IU/L 36(H) 33 44      RADIOGRAPHIC STUDIES: I have personally reviewed the radiological images as listed and agreed with the findings in the report. No results found.   ASSESSMENT & PLAN:  Alicia Hahn is a 59 y.o. female with    1. Malignant neoplasm of upper-outer quadrant of right breast, invasive ductal carcinoma, pT1cN0M0, stage IA, Triple Positive, Grade 3 -She was recently diagnosed in 12/2018. She has 53m mass of right breast with invasive ductal carcinoma and components of DCIS.  -She underwent right lumpectomy and SLNB on 01/27/19 by Dr CBrantley Stage We discussed her pathology which showed 1.3cm tumor removed, clear margins and 3 nodes negative.  -Given the size of her tumor and HER2 positive disease she has higher risk of recurrence. I recommend adjuvant chemotherapy to reduce this risk with Weekly Abraxane or Taxol and Herceptin q3weeks for 12 weeks then continue maintenance Herceptin to complete 1 year of  treatment. Given her recent gastritis I recommend Abraxane to avoid premedication of steroids.   --Chemotherapy consent: Side effects including but does not limited to, fatigue, nausea, vomiting, diarrhea, hair loss, neuropathy, fluid retention, renal and kidney dysfunction, neutropenic fever, needed for blood transfusion, bleeding, skin rash and reversible cardiomyopathy were discussed with patient in great detail. She agrees to proceed. Plan to start in 2 weeks.  -the goal of chemo is curative  -After chemo she will also proceed with adjuvant Radiation and antiestrogen therapy to further reduce her risk of local and distant recurrence.  -Her baseline Echo was normal. She will continue to f/u with Dr BHaroldine Lawsto monitor her heart function.  -Given Taxol and Abraxane can cause neuropathy I recommend she use ice  bags during chemo. She notes baseline neuropathy in 1 left finger from spinal compression. I offered chance of neuropathy study, she is interested.  -I again revived option of Dignicap. I gave her print out of more information. She will think about it.  -Physical exam today shows her surgical incision is healing well. She has seroma at incision site and scar tissue.    2. Gastritis/epigastric pain -After surgery she took Motrin and started having severe epigastric pain and required ED visit.  -She was started on Protonix and her pain has much improved to 2/10.  -She will start f/u with Dr. Tarri Glenn for endoscopy. If endoscopy cannot be done soon, she will continue Protonix on chemo.    3. Estrogen replacement and postmenopausal vaginal bleeding -Started after 2nd Mirena was taken out. Her Gyn put her on a third Mirena and bleeding stopped.  -She was estrogen replacement for several years for hot flashes.  By her GYN. -I discussed given her age she is likely postmenopausal and her given ER/PR positive breast cancer I recommend she take her Mirena out. If she has further vaginal bleeding she  should undergo workup for this with her Gyn.    4. Genetics  -Based on family history her father has prostate cancer and she had significant family history of breast cancer. She is eligible for genetic testing.  -Pt notes she genetic testing before at [facility]. Will obtain records.     PLAN:  -I will call in compazine today  -Copy note to Dr Tarri Glenn. Will try to move her GI appointment up  -chemo class is scheduled  -Lab, flush, f/u and Herceptin and Abraxane or Taxol in 2 weeks   No problem-specific Assessment & Plan notes found for this encounter.   No orders of the defined types were placed in this encounter.  All questions were answered. The patient knows to call the clinic with any problems, questions or concerns. No barriers to learning was detected. The total time spent in the appointment was 30 minutes.     Truitt Merle, MD 02/07/2019   I, Joslyn Devon, am acting as scribe for Truitt Merle, MD.   I have reviewed the above documentation for accuracy and completeness, and I agree with the above.

## 2019-02-02 NOTE — Patient Instructions (Addendum)
Ask oncologist regarding Shingrix (shingles vaccine), as well as tetanus vaccine. Also ask your oncologist about whether or not you can obtain your first colonoscopy. Please ask your oncologist whether or not you should obtain the pneumonia vaccine. Ask OBGYN regarding DEXA (bone density) scan.    If you have insomnia or difficulty sleeping, this information is for you:  - Avoid caffeinated beverages after lunch,  no alcoholic beverages,  no eating within 2-3 hours of lying down,  avoid exposure to blue light before bed,  avoid daytime naps, and  needs to maintain a regular sleep schedule- go to sleep and wake up around the same time every night.   - Resolve concerns or worries before entering bedroom:  Discussed relaxation techniques with patient and to keep a journal to write down fears\ worries.  I suggested seeing a counselor for CBT.   - Recommend patient meditate or do deep breathing exercises to help relax.   Incorporate the use of white noise machines or listen to "sleep meditation music", or recordings of guided meditations for sleep from YouTube which are free, such as  "guided meditation for detachment from over thinking"  by Mayford Knife.        Preventive Care for Adults, Female  A healthy lifestyle and preventive care can promote health and wellness. Preventive health guidelines for women include the following key practices.   A routine yearly physical is a good way to check with your health care provider about your health and preventive screening. It is a chance to share any concerns and updates on your health and to receive a thorough exam.   Visit your dentist for a routine exam and preventive care every 6 months. Brush your teeth twice a day and floss once a day. Good oral hygiene prevents tooth decay and gum disease.   The frequency of eye exams is based on your age, health, family medical history, use of contact lenses, and other factors. Follow your health care  provider's recommendations for frequency of eye exams.   Eat a healthy diet. Foods like vegetables, fruits, whole grains, low-fat dairy products, and lean protein foods contain the nutrients you need without too many calories. Decrease your intake of foods high in solid fats, added sugars, and salt. Eat the right amount of calories for you.Get information about a proper diet from your health care provider, if necessary.   Regular physical exercise is one of the most important things you can do for your health. Most adults should get at least 150 minutes of moderate-intensity exercise (any activity that increases your heart rate and causes you to sweat) each week. In addition, most adults need muscle-strengthening exercises on 2 or more days a week.   Maintain a healthy weight. The body mass index (BMI) is a screening tool to identify possible weight problems. It provides an estimate of body fat based on height and weight. Your health care provider can find your BMI, and can help you achieve or maintain a healthy weight.For adults 20 years and older:   - A BMI below 18.5 is considered underweight.   - A BMI of 18.5 to 24.9 is normal.   - A BMI of 25 to 29.9 is considered overweight.   - A BMI of 30 and above is considered obese.   Maintain normal blood lipids and cholesterol levels by exercising and minimizing your intake of trans and saturated fats.  Eat a balanced diet with plenty of fruit and vegetables. Blood tests for  lipids and cholesterol should begin at age 32 and be repeated every 5 years minimum.  If your lipid or cholesterol levels are high, you are over 40, or you are at high risk for heart disease, you may need your cholesterol levels checked more frequently.Ongoing high lipid and cholesterol levels should be treated with medicines if diet and exercise are not working.   If you smoke, find out from your health care provider how to quit. If you do not use tobacco, do not  start.   Lung cancer screening is recommended for adults aged 77-80 years who are at high risk for developing lung cancer because of a history of smoking. A yearly low-dose CT scan of the lungs is recommended for people who have at least a 30-pack-year history of smoking and are a current smoker or have quit within the past 15 years. A pack year of smoking is smoking an average of 1 pack of cigarettes a day for 1 year (for example: 1 pack a day for 30 years or 2 packs a day for 15 years). Yearly screening should continue until the smoker has stopped smoking for at least 15 years. Yearly screening should be stopped for people who develop a health problem that would prevent them from having lung cancer treatment.   If you are pregnant, do not drink alcohol. If you are breastfeeding, be very cautious about drinking alcohol. If you are not pregnant and choose to drink alcohol, do not have more than 1 drink per day. One drink is considered to be 12 ounces (355 mL) of beer, 5 ounces (148 mL) of wine, or 1.5 ounces (44 mL) of liquor.   Avoid use of street drugs. Do not share needles with anyone. Ask for help if you need support or instructions about stopping the use of drugs.   High blood pressure causes heart disease and increases the risk of stroke. Your blood pressure should be checked at least yearly.  Ongoing high blood pressure should be treated with medicines if weight loss and exercise do not work.   If you are 15-60 years old, ask your health care provider if you should take aspirin to prevent strokes.   Diabetes screening involves taking a blood sample to check your fasting blood sugar level. This should be done once every 3 years, after age 59, if you are within normal weight and without risk factors for diabetes. Testing should be considered at a younger age or be carried out more frequently if you are overweight and have at least 1 risk factor for diabetes.   Breast cancer screening is  essential preventive care for women. You should practice "breast self-awareness."  This means understanding the normal appearance and feel of your breasts and may include breast self-examination.  Any changes detected, no matter how small, should be reported to a health care provider.  Women in their 55s and 30s should have a clinical breast exam (CBE) by a health care provider as part of a regular health exam every 1 to 3 years.  After age 29, women should have a CBE every year.  Starting at age 54, women should consider having a mammogram (breast X-ray test) every year.  Women who have a family history of breast cancer should talk to their health care provider about genetic screening.  Women at a high risk of breast cancer should talk to their health care providers about having an MRI and a mammogram every year.   -Breast cancer gene (BRCA)-related  cancer risk assessment is recommended for women who have family members with BRCA-related cancers. BRCA-related cancers include breast, ovarian, tubal, and peritoneal cancers. Having family members with these cancers may be associated with an increased risk for harmful changes (mutations) in the breast cancer genes BRCA1 and BRCA2. Results of the assessment will determine the need for genetic counseling and BRCA1 and BRCA2 testing.   The Pap test is a screening test for cervical cancer. A Pap test can show cell changes on the cervix that might become cervical cancer if left untreated. A Pap test is a procedure in which cells are obtained and examined from the lower end of the uterus (cervix).   - Women should have a Pap test starting at age 65.   - Between ages 75 and 45, Pap tests should be repeated every 2 years.   - Beginning at age 80, you should have a Pap test every 3 years as long as the past 3 Pap tests have been normal.   - Some women have medical problems that increase the chance of getting cervical cancer. Talk to your health care provider about  these problems. It is especially important to talk to your health care provider if a new problem develops soon after your last Pap test. In these cases, your health care provider may recommend more frequent screening and Pap tests.   - The above recommendations are the same for women who have or have not gotten the vaccine for human papillomavirus (HPV).   - If you had a hysterectomy for a problem that was not cancer or a condition that could lead to cancer, then you no longer need Pap tests. Even if you no longer need a Pap test, a regular exam is a good idea to make sure no other problems are starting.   - If you are between ages 60 and 91 years, and you have had normal Pap tests going back 10 years, you no longer need Pap tests. Even if you no longer need a Pap test, a regular exam is a good idea to make sure no other problems are starting.   - If you have had past treatment for cervical cancer or a condition that could lead to cancer, you need Pap tests and screening for cancer for at least 20 years after your treatment.   - If Pap tests have been discontinued, risk factors (such as a new sexual partner) need to be reassessed to determine if screening should be resumed.   - The HPV test is an additional test that may be used for cervical cancer screening. The HPV test looks for the virus that can cause the cell changes on the cervix. The cells collected during the Pap test can be tested for HPV. The HPV test could be used to screen women aged 33 years and older, and should be used in women of any age who have unclear Pap test results. After the age of 68, women should have HPV testing at the same frequency as a Pap test.   Colorectal cancer can be detected and often prevented. Most routine colorectal cancer screening begins at the age of 38 years and continues through age 30 years. However, your health care provider may recommend screening at an earlier age if you have risk factors for colon  cancer. On a yearly basis, your health care provider may provide home test kits to check for hidden blood in the stool.  Use of a small camera at the end  of a tube, to directly examine the colon (sigmoidoscopy or colonoscopy), can detect the earliest forms of colorectal cancer. Talk to your health care provider about this at age 70, when routine screening begins. Direct exam of the colon should be repeated every 5 -10 years through age 59 years, unless early forms of pre-cancerous polyps or small growths are found.   People who are at an increased risk for hepatitis B should be screened for this virus. You are considered at high risk for hepatitis B if:  -You were born in a country where hepatitis B occurs often. Talk with your health care provider about which countries are considered high risk.  - Your parents were born in a high-risk country and you have not received a shot to protect against hepatitis B (hepatitis B vaccine).  - You have HIV or AIDS.  - You use needles to inject street drugs.  - You live with, or have sex with, someone who has Hepatitis B.  - You get hemodialysis treatment.  - You take certain medicines for conditions like cancer, organ transplantation, and autoimmune conditions.   Hepatitis C blood testing is recommended for all people born from [birth year] through 1967 and any individual with known risks for hepatitis C.   Practice safe sex. Use condoms and avoid high-risk sexual practices to reduce the spread of sexually transmitted infections (STIs). STIs include gonorrhea, chlamydia, syphilis, trichomonas, herpes, HPV, and human immunodeficiency virus (HIV). Herpes, HIV, and HPV are viral illnesses that have no cure. They can result in disability, cancer, and death. Sexually active women aged 34 years and younger should be checked for chlamydia. Older women with new or multiple partners should also be tested for chlamydia. Testing for other STIs is recommended if you are sexually  active and at increased risk.   Osteoporosis is a disease in which the bones lose minerals and strength with aging. This can result in serious bone fractures or breaks. The risk of osteoporosis can be identified using a bone density scan. Women ages 86 years and over and women at risk for fractures or osteoporosis should discuss screening with their health care providers. Ask your health care provider whether you should take a calcium supplement or vitamin D to There are also several preventive steps women can take to avoid osteoporosis and resulting fractures or to keep osteoporosis from worsening. -->Recommendations include:  Eat a balanced diet high in fruits, vegetables, calcium, and vitamins.  Get enough calcium. The recommended total intake of is 1,200 mg daily; for best absorption, if taking supplements, divide doses into 250-500 mg doses throughout the day. Of the two types of calcium, calcium carbonate is best absorbed when taken with food but calcium citrate can be taken on an empty stomach.  Get enough vitamin D. [employer] and the [employer] recommend at least 1,000 IU per day for women age 15 and over who are at risk of vitamin D deficiency. Vitamin D deficiency can be caused by inadequate sun exposure (for example, those who live in [place]).  Avoid alcohol and smoking. Heavy alcohol intake (more than 7 drinks per week) increases the risk of falls and hip fracture and women smokers tend to lose bone more rapidly and have lower bone mass than nonsmokers. Stopping smoking is one of the most important changes women can make to improve their health and decrease risk for disease.  Be physically active every day. Weight-bearing exercise (for example, fast walking, hiking, jogging, and weight training) may  strengthen bones or slow the rate of bone loss that comes with aging. Balancing and muscle-strengthening exercises can reduce the risk of falling and  fracture.  Consider therapeutic medications. Currently, several types of effective drugs are available. Healthcare providers can recommend the type most appropriate for each woman.  Eliminate environmental factors that may contribute to accidents. Falls cause nearly 90% of all osteoporotic fractures, so reducing this risk is an important bone-health strategy. Measures include ample lighting, removing obstructions to walking, using nonskid rugs on floors, and placing mats and/or grab bars in showers.  Be aware of medication side effects. Some common medicines make bones weaker. These include a type of steroid drug called glucocorticoids used for arthritis and asthma, some antiseizure drugs, certain sleeping pills, treatments for endometriosis, and some cancer drugs. An overactive thyroid gland or using too much thyroid hormone for an underactive thyroid can also be a problem. If you are taking these medicines, talk to your doctor about what you can do to help protect your bones.reduce the rate of osteoporosis.    Menopause can be associated with physical symptoms and risks. Hormone replacement therapy is available to decrease symptoms and risks. You should talk to your health care provider about whether hormone replacement therapy is right for you.   Use sunscreen. Apply sunscreen liberally and repeatedly throughout the day. You should seek shade when your shadow is shorter than you. Protect yourself by wearing long sleeves, pants, a wide-brimmed hat, and sunglasses year round, whenever you are outdoors.   Once a month, do a whole body skin exam, using a mirror to look at the skin on your back. Tell your health care provider of new moles, moles that have irregular borders, moles that are larger than a pencil eraser, or moles that have changed in shape or color.   -Stay current with required vaccines (immunizations).   Influenza vaccine. All adults should be immunized every year.  Tetanus,  diphtheria, and acellular pertussis (Td, Tdap) vaccine. Pregnant women should receive 1 dose of Tdap vaccine during each pregnancy. The dose should be obtained regardless of the length of time since the last dose. Immunization is preferred during the 27th 36th week of gestation. An adult who has not previously received Tdap or who does not know her vaccine status should receive 1 dose of Tdap. This initial dose should be followed by tetanus and diphtheria toxoids (Td) booster doses every 10 years. Adults with an unknown or incomplete history of completing a 3-dose immunization series with Td-containing vaccines should begin or complete a primary immunization series including a Tdap dose. Adults should receive a Td booster every 10 years.  Varicella vaccine. An adult without evidence of immunity to varicella should receive 2 doses or a second dose if she has previously received 1 dose. Pregnant females who do not have evidence of immunity should receive the first dose after pregnancy. This first dose should be obtained before leaving the health care facility. The second dose should be obtained 4 8 weeks after the first dose.  Human papillomavirus (HPV) vaccine. Females aged 103 26 years who have not received the vaccine previously should obtain the 3-dose series. The vaccine is not recommended for use in pregnant females. However, pregnancy testing is not needed before receiving a dose. If a female is found to be pregnant after receiving a dose, no treatment is needed. In that case, the remaining doses should be delayed until after the pregnancy. Immunization is recommended for any person with an immunocompromised  condition through the age of 90 years if she did not get any or all doses earlier. During the 3-dose series, the second dose should be obtained 4 8 weeks after the first dose. The third dose should be obtained 24 weeks after the first dose and 16 weeks after the second dose.  Zoster vaccine. One dose  is recommended for adults aged 80 years or older unless certain conditions are present.  Measles, mumps, and rubella (MMR) vaccine. Adults born before [birth year] generally are considered immune to measles and mumps. Adults born in [birth year] or later should have 1 or more doses of MMR vaccine unless there is a contraindication to the vaccine or there is laboratory evidence of immunity to each of the three diseases. A routine second dose of MMR vaccine should be obtained at least 28 days after the first dose for students attending postsecondary schools, health care workers, or international travelers. People who received inactivated measles vaccine or an unknown type of measles vaccine during 1963 1967 should receive 2 doses of MMR vaccine. People who received inactivated mumps vaccine or an unknown type of mumps vaccine before 1979 and are at high risk for mumps infection should consider immunization with 2 doses of MMR vaccine. For females of childbearing age, rubella immunity should be determined. If there is no evidence of immunity, females who are not pregnant should be vaccinated. If there is no evidence of immunity, females who are pregnant should delay immunization until after pregnancy. Unvaccinated health care workers born before [birth year] who lack laboratory evidence of measles, mumps, or rubella immunity or laboratory confirmation of disease should consider measles and mumps immunization with 2 doses of MMR vaccine or rubella immunization with 1 dose of MMR vaccine.  Pneumococcal 13-valent conjugate (PCV13) vaccine. When indicated, a person who is uncertain of her immunization history and has no record of immunization should receive the PCV13 vaccine. An adult aged 27 years or older who has certain medical conditions and has not been previously immunized should receive 1 dose of PCV13 vaccine. This PCV13 should be followed with a dose of pneumococcal polysaccharide (PPSV23) vaccine. The PPSV23 vaccine dose should be  obtained at least 8 weeks after the dose of PCV13 vaccine. An adult aged 58 years or older who has certain medical conditions and previously received 1 or more doses of PPSV23 vaccine should receive 1 dose of PCV13. The PCV13 vaccine dose should be obtained 1 or more years after the last PPSV23 vaccine dose.  Pneumococcal polysaccharide (PPSV23) vaccine. When PCV13 is also indicated, PCV13 should be obtained first. All adults aged 9 years and older should be immunized. An adult younger than age 12 years who has certain medical conditions should be immunized. Any person who resides in a nursing home or long-term care facility should be immunized. An adult smoker should be immunized. People with an immunocompromised condition and certain other conditions should receive both PCV13 and PPSV23 vaccines. People with human immunodeficiency virus (HIV) infection should be immunized as soon as possible after diagnosis. Immunization during chemotherapy or radiation therapy should be avoided. Routine use of PPSV23 vaccine is not recommended for American Indians, [place] Natives, or people younger than 65 years unless there are medical conditions that require PPSV23 vaccine. When indicated, people who have unknown immunization and have no record of immunization should receive PPSV23 vaccine. One-time revaccination 5 years after the first dose of PPSV23 is recommended for people aged 63 64 years who have chronic kidney failure, nephrotic syndrome, asplenia, or  immunocompromised conditions. People who received 1 2 doses of PPSV23 before age 11 years should receive another dose of PPSV23 vaccine at age 56 years or later if at least 5 years have passed since the previous dose. Doses of PPSV23 are not needed for people immunized with PPSV23 at or after age 88 years.  Meningococcal vaccine. Adults with asplenia or persistent complement component deficiencies should receive 2 doses of quadrivalent meningococcal conjugate  (MenACWY-D) vaccine. The doses should be obtained at least 2 months apart. Microbiologists working with certain meningococcal bacteria, [place] recruits, people at risk during an outbreak, and people who travel to or live in countries with a high rate of meningitis should be immunized. A first-year college student up through age 1 years who is living in a residence hall should receive a dose if she did not receive a dose on or after her 16th birthday. Adults who have certain high-risk conditions should receive one or more doses of vaccine.  Hepatitis A vaccine. Adults who wish to be protected from this disease, have certain high-risk conditions, work with hepatitis A-infected animals, work in hepatitis A research labs, or travel to or work in countries with a high rate of hepatitis A should be immunized. Adults who were previously unvaccinated and who anticipate close contact with an international adoptee during the first 60 days after arrival in the [place] from a country with a high rate of hepatitis A should be immunized.  Hepatitis B vaccine.  Adults who wish to be protected from this disease, have certain high-risk conditions, may be exposed to blood or other infectious body fluids, are household contacts or sex partners of hepatitis B positive people, are clients or workers in certain care facilities, or travel to or work in countries with a high rate of hepatitis B should be immunized.  Haemophilus influenzae type b (Hib) vaccine. A previously unvaccinated person with asplenia or sickle cell disease or having a scheduled splenectomy should receive 1 dose of Hib vaccine. Regardless of previous immunization, a recipient of a hematopoietic stem cell transplant should receive a 3-dose series 6 12 months after her successful transplant. Hib vaccine is not recommended for adults with HIV infection.  Preventive Services / Frequency Ages 83 to 39years  Blood pressure check.** / Every 1 to 2  years.  Lipid and cholesterol check.** / Every 5 years beginning at age 77.  Clinical breast exam.** / Every 3 years for women in their 72s and 68s.  BRCA-related cancer risk assessment.** / For women who have family members with a BRCA-related cancer (breast, ovarian, tubal, or peritoneal cancers).  Pap test.** / Every 2 years from ages 73 through 31. Every 3 years starting at age 85 through age 8 or 11 with a history of 3 consecutive normal Pap tests.  HPV screening.** / Every 3 years from ages 16 through ages 62 to 27 with a history of 3 consecutive normal Pap tests.  Hepatitis C blood test.** / For any individual with known risks for hepatitis C.  Skin self-exam. / Monthly.  Influenza vaccine. / Every year.  Tetanus, diphtheria, and acellular pertussis (Tdap, Td) vaccine.** / Consult your health care provider. Pregnant women should receive 1 dose of Tdap vaccine during each pregnancy. 1 dose of Td every 10 years.  Varicella vaccine.** / Consult your health care provider. Pregnant females who do not have evidence of immunity should receive the first dose after pregnancy.  HPV vaccine. / 3 doses over 6 months, if 26 and  younger. The vaccine is not recommended for use in pregnant females. However, pregnancy testing is not needed before receiving a dose.  Measles, mumps, rubella (MMR) vaccine.** / You need at least 1 dose of MMR if you were born in 1957 or later. You may also need a 2nd dose. For females of childbearing age, rubella immunity should be determined. If there is no evidence of immunity, females who are not pregnant should be vaccinated. If there is no evidence of immunity, females who are pregnant should delay immunization until after pregnancy.  Pneumococcal 13-valent conjugate (PCV13) vaccine.** / Consult your health care provider.  Pneumococcal polysaccharide (PPSV23) vaccine.** / 1 to 2 doses if you smoke cigarettes or if you have certain conditions.  Meningococcal  vaccine.** / 1 dose if you are age 31 to 41 years and a Market researcher living in a residence hall, or have one of several medical conditions, you need to get vaccinated against meningococcal disease. You may also need additional booster doses.  Hepatitis A vaccine.** / Consult your health care provider.  Hepatitis B vaccine.** / Consult your health care provider.  Haemophilus influenzae type b (Hib) vaccine.** / Consult your health care provider.  Ages 24 to 64years  Blood pressure check.** / Every 1 to 2 years.  Lipid and cholesterol check.** / Every 5 years beginning at age 67 years.  Lung cancer screening. / Every year if you are aged 50 80 years and have a 30-pack-year history of smoking and currently smoke or have quit within the past 15 years. Yearly screening is stopped once you have quit smoking for at least 15 years or develop a health problem that would prevent you from having lung cancer treatment.  Clinical breast exam.** / Every year after age 53 years.  BRCA-related cancer risk assessment.** / For women who have family members with a BRCA-related cancer (breast, ovarian, tubal, or peritoneal cancers).  Mammogram.** / Every year beginning at age 57 years and continuing for as long as you are in good health. Consult with your health care provider.  Pap test.** / Every 3 years starting at age 65 years through age 71 or 31 years with a history of 3 consecutive normal Pap tests.  HPV screening.** / Every 3 years from ages 62 years through ages 44 to 44 years with a history of 3 consecutive normal Pap tests.  Fecal occult blood test (FOBT) of stool. / Every year beginning at age 37 years and continuing until age 32 years. You may not need to do this test if you get a colonoscopy every 10 years.  Flexible sigmoidoscopy or colonoscopy.** / Every 5 years for a flexible sigmoidoscopy or every 10 years for a colonoscopy beginning at age 19 years and continuing until age 67  years.  Hepatitis C blood test.** / For all people born from [birth year] through 1967 and any individual with known risks for hepatitis C.  Skin self-exam. / Monthly.  Influenza vaccine. / Every year.  Tetanus, diphtheria, and acellular pertussis (Tdap/Td) vaccine.** / Consult your health care provider. Pregnant women should receive 1 dose of Tdap vaccine during each pregnancy. 1 dose of Td every 10 years.  Varicella vaccine.** / Consult your health care provider. Pregnant females who do not have evidence of immunity should receive the first dose after pregnancy.  Zoster vaccine.** / 1 dose for adults aged 32 years or older.  Measles, mumps, rubella (MMR) vaccine.** / You need at least 1 dose of MMR if you were  born in [birth year] or later. You may also need a 2nd dose. For females of childbearing age, rubella immunity should be determined. If there is no evidence of immunity, females who are not pregnant should be vaccinated. If there is no evidence of immunity, females who are pregnant should delay immunization until after pregnancy.  Pneumococcal 13-valent conjugate (PCV13) vaccine.** / Consult your health care provider.  Pneumococcal polysaccharide (PPSV23) vaccine.** / 1 to 2 doses if you smoke cigarettes or if you have certain conditions.  Meningococcal vaccine.** / Consult your health care provider.  Hepatitis A vaccine.** / Consult your health care provider.  Hepatitis B vaccine.** / Consult your health care provider.  Haemophilus influenzae type b (Hib) vaccine.** / Consult your health care provider.  Ages 2 years and over  Blood pressure check.** / Every 1 to 2 years.  Lipid and cholesterol check.** / Every 5 years beginning at age 50 years.  Lung cancer screening. / Every year if you are aged 63 80 years and have a 30-pack-year history of smoking and currently smoke or have quit within the past 15 years. Yearly screening is stopped once you have quit smoking for at least 15 years or  develop a health problem that would prevent you from having lung cancer treatment.  Clinical breast exam.** / Every year after age 40 years.  BRCA-related cancer risk assessment.** / For women who have family members with a BRCA-related cancer (breast, ovarian, tubal, or peritoneal cancers).  Mammogram.** / Every year beginning at age 54 years and continuing for as long as you are in good health. Consult with your health care provider.  Pap test.** / Every 3 years starting at age 84 years through age 72 or 80 years with 3 consecutive normal Pap tests. Testing can be stopped between 65 and 70 years with 3 consecutive normal Pap tests and no abnormal Pap or HPV tests in the past 10 years.  HPV screening.** / Every 3 years from ages 62 years through ages 40 or 66 years with a history of 3 consecutive normal Pap tests. Testing can be stopped between 65 and 70 years with 3 consecutive normal Pap tests and no abnormal Pap or HPV tests in the past 10 years.  Fecal occult blood test (FOBT) of stool. / Every year beginning at age 77 years and continuing until age 37 years. You may not need to do this test if you get a colonoscopy every 10 years.  Flexible sigmoidoscopy or colonoscopy.** / Every 5 years for a flexible sigmoidoscopy or every 10 years for a colonoscopy beginning at age 70 years and continuing until age 49 years.  Hepatitis C blood test.** / For all people born from [birth year] through 1967 and any individual with known risks for hepatitis C.  Osteoporosis screening.** / A one-time screening for women ages 38 years and over and women at risk for fractures or osteoporosis.  Skin self-exam. / Monthly.  Influenza vaccine. / Every year.  Tetanus, diphtheria, and acellular pertussis (Tdap/Td) vaccine.** / 1 dose of Td every 10 years.  Varicella vaccine.** / Consult your health care provider.  Zoster vaccine.** / 1 dose for adults aged 66 years or older.  Pneumococcal 13-valent conjugate  (PCV13) vaccine.** / Consult your health care provider.  Pneumococcal polysaccharide (PPSV23) vaccine.** / 1 dose for all adults aged 91 years and older.  Meningococcal vaccine.** / Consult your health care provider.  Hepatitis A vaccine.** / Consult your health care provider.  Hepatitis B vaccine.** / Consult  your health care provider.  Haemophilus influenzae type b (Hib) vaccine.** / Consult your health care provider. ** Family history and personal history of risk and conditions may change your health care provider's recommendations. Document Released: 03/04/2001 Document Revised: 10/27/2012  [facility] Patient Information 2014 [place], [place].   EXERCISE AND DIET:  We recommended that you start or continue a regular exercise program for good health. Regular exercise means any activity that makes your heart beat faster and makes you sweat.  We recommend exercising at least 30 minutes per day at least 3 days a week, preferably 5.  We also recommend a diet low in fat and sugar / carbohydrates.  Inactivity, poor dietary choices and obesity can cause diabetes, heart attack, stroke, and kidney damage, among others.     ALCOHOL AND SMOKING:  Women should limit their alcohol intake to no more than 7 drinks/beers/glasses of wine (combined, not each!) per week. Moderation of alcohol intake to this level decreases your risk of breast cancer and liver damage.  ( And of course, no recreational drugs are part of a healthy lifestyle.)  Also, you should not be smoking at all or even being exposed to second hand smoke. Most people know smoking can cause cancer, and various heart and lung diseases, but did you know it also contributes to weakening of your bones?  Aging of your skin?  Yellowing of your teeth and nails?   CALCIUM AND VITAMIN D:  Adequate intake of calcium and Vitamin D are recommended.  The recommendations for exact amounts of these supplements seem to change often, but generally speaking 600  mg of calcium (either carbonate or citrate) and 800 units of Vitamin D per day seems prudent. Certain women may benefit from higher intake of Vitamin D.  If you are among these women, your doctor will have told you during your visit.     PAP SMEARS:  Pap smears, to check for cervical cancer or precancers,  have traditionally been done yearly, although recent scientific advances have shown that most women can have pap smears less often.  However, every woman still should have a physical exam from her gynecologist or primary care physician every year. It will include a breast check, inspection of the vulva and vagina to check for abnormal growths or skin changes, a visual exam of the cervix, and then an exam to evaluate the size and shape of the uterus and ovaries.  And after 61 years of age, a rectal exam is indicated to check for rectal cancers. We will also provide age appropriate advice regarding health maintenance, like when you should have certain vaccines, screening for sexually transmitted diseases, bone density testing, colonoscopy, mammograms, etc.    MAMMOGRAMS:  All women over 52 years old should have a yearly mammogram. Many facilities now offer a "3D" mammogram, which may cost around $50 extra out of pocket. If possible,  we recommend you accept the option to have the 3D mammogram performed.  It both reduces the number of women who will be called back for extra views which then turn out to be normal, and it is better than the routine mammogram at detecting truly abnormal areas.     COLONOSCOPY:  Colonoscopy to screen for colon cancer is recommended for all women at age 51.  We know, you hate the idea of the prep.  We agree, BUT, having colon cancer and not knowing it is worse!!  Colon cancer so often starts as a polyp that can be seen  and removed at colonscopy, which can quite literally save your life!  And if your first colonoscopy is normal and you have no family history of colon cancer,  most women don't have to have it again for 10 years.  Once every ten years, you can do something that may end up saving your life, right?  We will be happy to help you get it scheduled when you are ready.  Be sure to check your insurance coverage so you understand how much it will cost.  It may be covered as a preventative service at no cost, but you should check your particular policy.   If you have insomnia or difficulty sleeping, this information is for you:  - Avoid caffeinated beverages after lunch,  no alcoholic beverages,  no eating within 2-3 hours of lying down,  avoid exposure to blue light before bed,  avoid daytime naps, and  needs to maintain a regular sleep schedule- go to sleep and wake up around the same time every night.   - Resolve concerns or worries before entering bedroom:  Discussed relaxation techniques with patient and to keep a journal to write down fears\ worries.  I suggested seeing a counselor for CBT.   - Recommend patient meditate or do deep breathing exercises to help relax.   Incorporate the use of white noise machines or listen to "sleep meditation music", or recordings of guided meditations for sleep from YouTube which are free, such as  "guided meditation for detachment from over thinking"  by Mayford Knife.

## 2019-02-02 NOTE — Progress Notes (Signed)
Impression and Recommendations:    1. Encounter for wellness examination   2. Health education/counseling   3. Epigastric pain   4. Screening for colon cancer   5. Malignant neoplasm of upper-outer quadrant of right breast in female, estrogen receptor positive ([place])   6. Family history of breast cancer in sister(age33) and mother   17. Cervical radiculopathy at C7 on L    8. Numbness and tingling of hand-  C7 distribution L hand   9. Psychophysiological insomnia   10. Stress and adjustment reaction     Chronic Concerns  Psychophysiological Insomnia, Stress & Adjustment Reaction -  D/c pt stress mgt tech; declines counseling referral -  To aid with sleep, advised sleep meditation, use of white noise machine, limiting screen use at night/utilizing blue-light-blocking glasses if exposed to screens at night, Calm / Headspace apps, and other prudent sleep hygiene habits. - Prescription trazodone provided today.  Numbness & Tingling of Hand, Cervical Radiculopathy at C7 on L - Reviewed patient's symptoms of cervical radiculopathy / impingement in arm extensively today.  If Sx W, patient will be referred for injections and specialist intervention;  She declines further w/up today.     Female Physical  1) Anticipatory Guidance: Discussed importance of wearing a seatbelt while driving, not texting while driving; sunscreen when outside along with yearly skin surveillance; eating a well balanced and modest diet; physical activity at least 25 minutes per day or 150 min/ week of moderate to intense activity.  - Prudent home skin surveillance discussed extensively with patient today.   2) Immunizations / Screenings / Labs:   All immunizations and screenings that patient agrees to, are up-to-date per recommendations or will be updated today.  Patient understands the needs for q 52mo dental and yearly vision screens which pt will schedule independently. Obtain CBC, CMP, HgA1c, Lipid panel,  TSH and vit D when fasting if not already done recently.   - Patient recently obtained mammogram.  Is currently being followed by oncology for recent diagnosis of breast cancer.  - Per patient, recently had a pap smear done.  Patient follows up regularly with OBGYN.  - Advised patient to ask about need for DEXA screening.  - Patient has never had a colonoscopy in the past.  As patient recently went to ED for epigsatric pain, Ambulatory Referral to Gastroenterology provided today, both for assessment of recent complaints and need for initial colonoscopy screening.  - Per patient, last TDAP with [facility].  Patient will speak with oncologist regarding needs.  - Need for shingles vaccination.  Patient will speak with her oncologist regarding this.  - Advised patient to ask her oncologist regarding her need for all applicable screenings and vaccinations, including pneumonia and COVID-19.  - Declines Hep C/HIV screening today.  3) Weight, Health Counseling & Preventative Maintenance Discussed goal of losing even 5-10% of current body weight which would improve overall feelings of well being and improve objective health data significantly.   Improve nutrient density of diet through increasing intake of fruits and vegetables and decreasing saturated/trans fats, white flour products and refined sugar products.   - Advised patient to continue working toward exercising to improve overall mental, physical, and emotional health, especially while she receives treatment for breast cancer.  - Reviewed the "spokes of the wheel" of mood and health management.  Stressed the importance of ongoing prudent habits, including regular exercise, appropriate sleep hygiene, healthful dietary habits, and prayer/meditation to relax.  - Encouraged patient  to engage in daily physical activity, especially a formal exercise routine.  Recommended that the patient eventually strive for at least 150 minutes of moderate  cardiovascular activity per week according to guidelines established by the [employer].   - Along with exercise, to help improve her immune system, encouraged healthy dietary habits, including high antioxidants, low carbs, and high amounts of lean protein in diet.   - Patient should also consume adequate amounts of water.  - Health counseling performed.  All questions answered.  Recommendations - Return in a couple of months for follow-up progress on trazodone (sleep hygiene) and mood, sooner if lab abnormality.    Meds ordered this encounter  Medications  . traZODone (DESYREL) 50 MG tablet    Sig: Take 1-2 tablets (50-100 mg total) by mouth at bedtime as needed for sleep.    Dispense:  180 tablet    Refill:  0    Orders Placed This Encounter  Procedures  . Ambulatory referral to Gastroenterology     Return for f/up 2 months, progress on trazodone (sleep hygiene) and mood, sooner if lab abnormality.    Reminded pt important of f-up preventative CPE in 1 year.  Reminded pt again, this is in addition to any chronic care visits.    Gross side effects, risk and benefits, and alternatives of medications discussed with patient.  Patient is aware that all medications have potential side effects and we are unable to predict every side effect or drug-drug interaction that may occur.  Expresses verbal understanding and consents to current therapy plan and treatment regimen.  F-up preventative CPE in 1 year- reminded pt again, this is in addition to any chronic care visits.    Please see orders placed and AVS handed out to patient at the end of our visit for further patient instructions/ counseling done pertaining to today's office visit.  This document serves as a record of services personally performed by Mellody Dance, DO. It was created on her behalf by Toni Amend, a trained medical scribe. The creation of this record is based on the scribe's personal observations and the  provider's statements to them.   This case required medical decision making of at least moderate complexity. The above documentation has been reviewed to be accurate and was completed by Marjory Sneddon, D.O.     Subjective:    Chief Complaint  Patient presents with  . Annual Exam   CC:   HPI: Alicia Hahn is a 59 y.o. female who presents to [facility] today a yearly health maintenance exam.  Health Maintenance Summary Reviewed and updated, unless pt declines services.  Colonoscopy:  Patient has never had a colonoscopy. Tobacco History Reviewed:   Y; former smoker, quit in 1980, 1.25 pack-years.  Alcohol:  No concerns, no excessive use. Exercise Habits:  Not meeting AHA guidelines STD concerns:  None reported. Drug Use:  None reported. Birth control method:  Post-menopausal. Menses regular:  Post-menopausal. Lumps or breast concerns:  Yes; recently diagnosed with R breast cancer and currently receiving treatment through oncology/radiology. Breast Cancer Family History:  Yes, extensive family history of breast cancer in sister (sister passed away of breast cancer at age 24), mother (had DCIS in late 7's and received treatment), and maternal aunt (passed away in her 53's of breast cancer, living for 10 years after treatment). Bone/ DEXA scan:  Patient will ask OBGYN regarding need for screening.  Denies falling.  Notes it's been a crazy  past couple of months, with her breast cancer diagnosis.  States she wishes she had been able to obtain MRI's over the years for screening.  - Elevated BP Reading in Office States she feels her BP has been elevated at all of her doctor's appointments lately.  When she checks her BP at home, it's usually a little better, 130's/80's.  - Sleep Notes not really sleeping okay.  Confirms tossing and turning, getting hot during the night, "the whole menopause thing, and I can't get the estrogen anymore."  -  Visual Health Last went for eye exam in November 2019.  - Dental Health Notes she generally goes to the dentist twice per year, but not during COVID-19.  - Dermatological Health States she hasn't been to dermatology in 10-15 years.  States she thinks she has seborrheic keratosis somewhere on her body.  - Recent Epigastric Pain; Recently Seen in ED Notes "other than a kidney stone, I've never had any pain" like that before.  States the pain woke her up in the middle of the night and "felt like someone was stabbing me under the ribs and twisting the knife."  Notes this pain occurred after one day of taking Motrin.  Since this acute pain, patient denies nausea, vomiting, diarrhea, constipation, or other GI concerns.  She still experiences the same pain "but not as intense."  It occurs mostly at night; "it woke me up again during the night last night."  She is taking Protonix for this.  Notes "just eating potatoes and bread, extremely bland everything."  - Neurological Symptoms in Arm Starting at the end of October, notes she began having a pain that goes down her arm to her index finger.  She isn't sure if it's due to a cervical compression.  She noticed these symptoms while reaching in certain positions; "it runs down my arm and makes my finger numb," only with certain movements.  States it's "like electricity."  Notes "it's only in those certain positions, and then my finger will stay numb for a while, and then it will get better."      Immunization History  Administered Date(s) Administered  . Influenza,inj,Quad PF,6+ Mos 11/02/2018  . Influenza-Unspecified 11/02/2018    Health Maintenance  Topic Date Due  . Hepatitis C Screening  06/29/1960  . HIV Screening  05/21/1973  . TETANUS/TDAP  05/21/1977  . PAP SMEAR-Modifier  05/22/1979  . COLONOSCOPY  05/21/2008  . MAMMOGRAM  12/19/2020  . INFLUENZA VACCINE  Completed     Wt Readings from Last 3 Encounters:  02/07/19 173 lb 1.6  oz (78.5 kg)  02/02/19 171 lb 3.2 oz (77.7 kg)  01/27/19 172 lb 13.5 oz (78.4 kg)   BP Readings from Last 3 Encounters:  02/07/19 137/84  02/02/19 (!) 158/83  01/29/19 (!) 165/97   Pulse Readings from Last 3 Encounters:  02/07/19 78  02/02/19 86  01/29/19 93     Past Medical History:  Diagnosis Date  . Cancer [facility])    breast cancer      Past Surgical History:  Procedure Laterality Date  . BREAST LUMPECTOMY WITH RADIOACTIVE SEED AND SENTINEL LYMPH NODE BIOPSY Right 01/27/2019   Procedure: RIGHT BREAST LUMPECTOMY WITH RADIOACTIVE SEED AND SENTINEL LYMPH NODE MAPPING;  Surgeon: Erroll Luna, MD;  Location: [place];  Service: General;  Laterality: Right;  . COSMETIC SURGERY Bilateral 2000   breast aug/ lipo  . PORTACATH PLACEMENT Right 01/27/2019   Procedure: INSERTION PORT-A-CATH WITH ULTRASOUND;  Surgeon:  Erroll Luna, MD;  Location: [place];  Service: General;  Laterality: Right;      Family History  Problem Relation Age of Onset  . Diabetes Mother   . Heart disease Mother   . Hyperlipidemia Mother   . Breast cancer Mother 45  . Cancer Mother        breast DCIS  . Cancer Father 77       prostate cancer   . Diabetes Father   . Breast cancer Sister 42  . Cancer Sister 47       breast cancer   . Cancer Maternal Aunt 55       breast cancer   . Cancer Maternal Grandfather        colon cancer      Social History   Substance and Sexual Activity  Drug Use No  ,   Social History   Substance and Sexual Activity  Alcohol Use Yes  . Alcohol/week: 6.0 standard drinks  . Types: 6 Glasses of wine per week  ,   Social History   Tobacco Use  Smoking Status Former Smoker  . Packs/day: 0.25  . Years: 5.00  . Pack years: 1.25  . Quit date: 01/20/1978  . Years since quitting: 41.0  Smokeless Tobacco Never Used  Tobacco Comment   1982  ,   Social History   Substance and Sexual Activity  Sexual Activity Yes  . Birth  control/protection: None    Current Outpatient Medications on File Prior to Visit  Medication Sig Dispense Refill  . ALPRAZolam (XANAX) 0.25 MG tablet Take 0.25 mg by mouth 3 (three) times daily as needed.    Marland Kitchen CALCIUM PO Take 1,000 mg by mouth daily.    . Magnesium 125 MG CAPS Take 1 capsule by mouth daily.     . Melatonin 3 MG CAPS Take 3 mg by mouth at bedtime.     . Multiple Vitamins-Minerals (MULTIVITAMIN WITH MINERALS) tablet Take 1 tablet by mouth daily.    . pantoprazole (PROTONIX) 20 MG tablet Take 2 tablets (40 mg total) by mouth 2 (two) times daily for 14 days. 56 tablet 0  . VITAMIN D PO Take 12,000 Units by mouth daily.    . ondansetron (ZOFRAN ODT) 4 MG disintegrating tablet Take 1 tablet (4 mg total) by mouth every 8 (eight) hours as needed for nausea or vomiting. (Patient not taking: Reported on 02/02/2019) 20 tablet 0   No current facility-administered medications on file prior to visit.    Allergies: Patient has no known allergies.  Review of Systems: General:   Denies fever, chills, unexplained weight loss.  Optho/Auditory:   Denies visual changes, blurred vision/LOV Respiratory:   Denies SOB, DOE more than baseline levels.   Cardiovascular:   Denies chest pain, palpitations, new onset peripheral edema  Gastrointestinal:   Denies nausea, vomiting, diarrhea.  Genitourinary: Denies dysuria, freq/ urgency, flank pain or discharge from genitals.  Endocrine:     Denies hot or cold intolerance, polyuria, polydipsia. Musculoskeletal:   Denies unexplained myalgias, joint swelling, unexplained arthralgias, gait problems.  Skin:  Denies rash, suspicious lesions Neurological:     Denies dizziness, unexplained weakness, numbness  Psychiatric/Behavioral:   Denies mood changes, suicidal or homicidal ideations, hallucinations    Objective:    Blood pressure (!) 158/83, pulse 86, temperature 98 F (36.7 C), temperature source Oral, resp. rate 12, height 5\' 3"  (1.6 m), weight  171 lb 3.2 oz (77.7 kg), SpO2 100 %. Body  mass index is 30.33 kg/m. General Appearance:    Alert, cooperative, no distress, appears stated age  Head:    Normocephalic, without obvious abnormality, atraumatic  Eyes:    PERRL, conjunctiva/corneas clear, EOM's intact, fundi    benign, both eyes  Ears:    Normal TM's and external ear canals, both ears  Nose:   Nares normal, septum midline, mucosa normal, no drainage    or sinus tenderness  Throat:   Lips w/o lesion, mucosa moist, and tongue normal; teeth and   gums normal  Neck:   Supple, symmetrical, trachea midline, no adenopathy;    thyroid:  no enlargement/tenderness/nodules; no carotid   bruit or JVD  Back:     Symmetric, no curvature, ROM normal, no CVA tenderness  Lungs:     Clear to auscultation bilaterally, respirations unlabored, no       Wh/ R/ R  Chest Wall:    No tenderness or gross deformity; normal excursion   Heart:    Regular rate and rhythm, S1 and S2 normal, no murmur, rub   or gallop  Breast Exam:    Deferred to GYN / Oncology.  Abdomen:     Soft, non-tender, bowel sounds active all four quadrants, NO   G/R/R, no masses, no organomegaly  Genitalia:    Deferred to GYN.  Rectal:    Deferred to GYN / Gastroenterology.  Extremities:   Extremities normal, atraumatic, no cyanosis or gross edema  Pulses:   2+ and symmetric all extremities  Skin:   Warm, dry, Skin color, texture, turgor normal, no obvious rashes or lesions Psych: No HI/SI, judgement and insight good, Euthymic mood. Full Affect.  Neurologic:   CNII-XII intact, normal strength, sensation and reflexes    Throughout

## 2019-02-03 ENCOUNTER — Encounter: Payer: Self-pay | Admitting: Gastroenterology

## 2019-02-03 LAB — LIPID PANEL
Chol/HDL Ratio: 4.3 ratio (ref 0.0–4.4)
Cholesterol, Total: 249 mg/dL — ABNORMAL HIGH (ref 100–199)
HDL: 58 mg/dL (ref >39)
LDL Chol Calc (NIH): 157 mg/dL — ABNORMAL HIGH (ref 0–99)
Triglycerides: 189 mg/dL — ABNORMAL HIGH (ref 0–149)
VLDL Cholesterol Cal: 34 mg/dL (ref 5–40)

## 2019-02-03 LAB — CBC WITH DIFFERENTIAL/PLATELET
Basophils Absolute: 0 10*3/uL (ref 0.0–0.2)
Basos: 1 %
EOS (ABSOLUTE): 0.1 10*3/uL (ref 0.0–0.4)
Eos: 2 %
Hematocrit: 42.9 % (ref 34.0–46.6)
Hemoglobin: 14.5 g/dL (ref 11.1–15.9)
Immature Grans (Abs): 0 10*3/uL (ref 0.0–0.1)
Immature Granulocytes: 0 %
Lymphocytes Absolute: 1.7 10*3/uL (ref 0.7–3.1)
Lymphs: 28 %
MCH: 31.1 pg (ref 26.6–33.0)
MCHC: 33.8 g/dL (ref 31.5–35.7)
MCV: 92 fL (ref 79–97)
Monocytes Absolute: 0.6 10*3/uL (ref 0.1–0.9)
Monocytes: 10 %
Neutrophils Absolute: 3.4 10*3/uL (ref 1.4–7.0)
Neutrophils: 59 %
Platelets: 196 10*3/uL (ref 150–450)
RBC: 4.66 x10E6/uL (ref 3.77–5.28)
RDW: 12.2 % (ref 11.7–15.4)
WBC: 5.9 10*3/uL (ref 3.4–10.8)

## 2019-02-03 LAB — T4, FREE: Free T4: 1.12 ng/dL (ref 0.82–1.77)

## 2019-02-03 LAB — COMPREHENSIVE METABOLIC PANEL
ALT: 36 IU/L — ABNORMAL HIGH (ref 0–32)
AST: 19 IU/L (ref 0–40)
Albumin/Globulin Ratio: 1.6 (ref 1.2–2.2)
Albumin: 4.2 g/dL (ref 3.8–4.9)
Alkaline Phosphatase: 75 IU/L (ref 39–117)
BUN/Creatinine Ratio: 18 (ref 12–28)
BUN: 13 mg/dL (ref 8–27)
Bilirubin Total: 0.3 mg/dL (ref 0.0–1.2)
CO2: 23 mmol/L (ref 20–29)
Calcium: 9.5 mg/dL (ref 8.7–10.3)
Chloride: 103 mmol/L (ref 96–106)
Creatinine, Ser: 0.72 mg/dL (ref 0.57–1.00)
GFR calc Af Amer: 105 mL/min/{1.73_m2} (ref >59)
GFR calc non Af Amer: 91 mL/min/{1.73_m2} (ref >59)
Globulin, Total: 2.7 g/dL (ref 1.5–4.5)
Glucose: 106 mg/dL — ABNORMAL HIGH (ref 65–99)
Potassium: 4.1 mmol/L (ref 3.5–5.2)
Sodium: 142 mmol/L (ref 134–144)
Total Protein: 6.9 g/dL (ref 6.0–8.5)

## 2019-02-03 LAB — HEMOGLOBIN A1C
Est. average glucose Bld gHb Est-mCnc: 100 mg/dL
Hgb A1c MFr Bld: 5.1 % (ref 4.8–5.6)

## 2019-02-03 LAB — T3: T3, Total: 88 ng/dL (ref 71–180)

## 2019-02-03 LAB — VITAMIN D 25 HYDROXY (VIT D DEFICIENCY, FRACTURES): Vit D, 25-Hydroxy: 76.8 ng/mL (ref 30.0–100.0)

## 2019-02-03 LAB — TSH: TSH: 1.56 u[IU]/mL (ref 0.450–4.500)

## 2019-02-07 ENCOUNTER — Other Ambulatory Visit: Payer: Self-pay

## 2019-02-07 ENCOUNTER — Ambulatory Visit: Payer: 59 | Admitting: Hematology and Oncology

## 2019-02-07 ENCOUNTER — Encounter: Payer: Self-pay | Admitting: Hematology

## 2019-02-07 ENCOUNTER — Inpatient Hospital Stay: Payer: 59 | Attending: Hematology | Admitting: Hematology

## 2019-02-07 VITALS — BP 137/84 | HR 78 | Temp 98.0°F | Resp 18 | Ht 63.0 in | Wt 173.1 lb

## 2019-02-07 DIAGNOSIS — K2970 Gastritis, unspecified, without bleeding: Secondary | ICD-10-CM | POA: Insufficient documentation

## 2019-02-07 DIAGNOSIS — Z170 Estrogen receptor positive status [ER+]: Secondary | ICD-10-CM | POA: Diagnosis not present

## 2019-02-07 DIAGNOSIS — Z803 Family history of malignant neoplasm of breast: Secondary | ICD-10-CM | POA: Diagnosis not present

## 2019-02-07 DIAGNOSIS — R1013 Epigastric pain: Secondary | ICD-10-CM | POA: Insufficient documentation

## 2019-02-07 DIAGNOSIS — C50411 Malignant neoplasm of upper-outer quadrant of right female breast: Secondary | ICD-10-CM | POA: Insufficient documentation

## 2019-02-07 MED ORDER — LIDOCAINE-PRILOCAINE 2.5-2.5 % EX CREA: TOPICAL_CREAM | CUTANEOUS | 3 refills | Status: DC

## 2019-02-07 MED ORDER — PROCHLORPERAZINE MALEATE 10 MG PO TABS: 10.0000 mg | ORAL_TABLET | Freq: Four times a day (QID) | ORAL | 1 refills | Status: DC | PRN

## 2019-02-07 NOTE — Progress Notes (Signed)
Spoke with patient as follow up from today's visit, per Dr. Lewayne Bunting advice she will put off getting the shingles vaccines until she is finished with her treatments.  She has also decided not to do Dignicap.  Dr. Burr Medico was made aware.

## 2019-02-07 NOTE — Progress Notes (Signed)
START ON PATHWAY REGIMEN - Breast     Cycle 1: A cycle is 7 days:     Trastuzumab-xxxx      Paclitaxel    Cycles 2 through 12: A cycle is every 7 days:     Trastuzumab-xxxx      Paclitaxel    Cycles 13 through 25: A cycle is every 21 days:     Trastuzumab-xxxx   **Always confirm dose/schedule in your pharmacy ordering system**  Patient Characteristics: Postoperative without Neoadjuvant Therapy (Pathologic Staging), Invasive Disease, Adjuvant Therapy, HER2 Positive, ER Positive, Node Negative, pT1c, pN0/N1mi Therapeutic Status: Postoperative without Neoadjuvant Therapy (Pathologic Staging) AJCC Grade: G3 AJCC N Category: pN0 AJCC M Category: cM0 ER Status: Positive (+) AJCC 8 Stage Grouping: IA HER2 Status: Positive (+) Oncotype Dx Recurrence Score: Not Appropriate AJCC T Category: pT1c PR Status: Positive (+) Intent of Therapy: Curative Intent, Discussed with Patient 

## 2019-02-08 ENCOUNTER — Encounter: Payer: Self-pay | Admitting: *Deleted

## 2019-02-08 ENCOUNTER — Telehealth: Payer: Self-pay | Admitting: Hematology

## 2019-02-08 NOTE — Telephone Encounter (Signed)
Scheduled appt per 1/18 los.  Sent a message to HIM pool to get a calendar mailed out. 

## 2019-02-11 ENCOUNTER — Ambulatory Visit (INDEPENDENT_AMBULATORY_CARE_PROVIDER_SITE_OTHER): Payer: 59 | Admitting: Gastroenterology

## 2019-02-11 ENCOUNTER — Ambulatory Visit: Payer: 59 | Admitting: Gastroenterology

## 2019-02-11 ENCOUNTER — Other Ambulatory Visit: Payer: Self-pay | Admitting: Family Medicine

## 2019-02-11 ENCOUNTER — Encounter: Payer: Self-pay | Admitting: Gastroenterology

## 2019-02-11 DIAGNOSIS — R1013 Epigastric pain: Secondary | ICD-10-CM | POA: Diagnosis not present

## 2019-02-11 DIAGNOSIS — G8929 Other chronic pain: Secondary | ICD-10-CM

## 2019-02-11 DIAGNOSIS — R933 Abnormal findings on diagnostic imaging of other parts of digestive tract: Secondary | ICD-10-CM

## 2019-02-11 DIAGNOSIS — E785 Hyperlipidemia, unspecified: Secondary | ICD-10-CM

## 2019-02-11 DIAGNOSIS — Z01818 Encounter for other preprocedural examination: Secondary | ICD-10-CM | POA: Diagnosis not present

## 2019-02-11 MED ORDER — ATORVASTATIN CALCIUM 40 MG PO TABS: ORAL_TABLET | ORAL | 0 refills | Status: DC

## 2019-02-11 MED ORDER — PANTOPRAZOLE SODIUM 40 MG PO TBEC: 40.0000 mg | DELAYED_RELEASE_TABLET | Freq: Every day | ORAL | 3 refills | Status: DC

## 2019-02-11 NOTE — Patient Instructions (Addendum)
We have sent the following medications to your pharmacy for you to pick up at your convenience:  Pantoprazole 40 mg daily for at least 8-12 weeks  Avoid NSAIDs.   You have been scheduled for an endoscopy. Please follow written instructions given to you at your visit today. If you use inhalers (even only as needed), please bring them with you on the day of your procedure.  I value your feedback and thank you for entrusting [place] with your care. If you get a [facility] patient survey, I would appreciate you taking the time to let us know about your experience today. Thank you!   Due to recent changes in healthcare laws, you may see the results of your imaging and laboratory studies on MyChart before your provider has had a chance to review them.  We understand that in some cases there may be results that are confusing or concerning to you. Not all laboratory results come back in the same time frame and the provider may be waiting for multiple results in order to interpret others.  Please give [place] 48 hours in order for your provider to thoroughly review all the results before contacting the office for clarification of your results.   '

## 2019-02-11 NOTE — Progress Notes (Signed)
TELEHEALTH VISIT  Referring Provider: Mellody Dance, DO Primary Care Physician:  Mellody Dance, DO  Tele-visit due to COVID-19 pandemic Patient requested visit virtually, consented to the virtual encounter via audio enabled telemedicine application Contact made at: 14:30 02/11/19 Patient verified by name and date of birth Location of patient: Home Location provider: [facility] Names of persons participating: Me, patient, Tinnie Gens CMA Time spent on chart review, telehealth visit, and documentation today: 38 minutes  I discussed the limitations of evaluation and management by telemedicine. The patient expressed understanding and agreed to proceed.  Reason for Consultation:  Epigastric pain   IMPRESSION:  Epigastric pain improving on PPI therapy Motrin x 2  Recent breast surgery Abnormal CT scan: thickening of the gastric antrum Rare GERD  Symptoms and CT suggest gastritis. EGD recommended to evaluate for PUD, H pylori, and malignancy. Findings will allow for tailored treatment with chemotherapy. Continue PPI in the meantime. I've recommended that she stay on pantoprazole throughout chemotherapy. Will adjust treatment based on endoscopic findings.     PLAN: Pantoprazole 40 mg daily for at least 8-12 weeks (#90 with 3 refills) Avoid all NSAIDs EGD with gastric biopsies  The nature of the procedure, as well as the risks, benefits, and alternatives were carefully and thoroughly reviewed with the patient. Ample time for discussion and questions allowed. The patient understood, was satisfied, and agreed to proceed.  HPI: Alicia Hahn is a 59 y.o. female referred by Dr. Burr Medico for epigastric pain. 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 The history is obtained through the patient, communications from Dr. Burr Medico, and review of her electronic health record.   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 Right lumpectomy and SLNB on 01/27/19 by Dr Brantley Stage. Adjuvant chemotherapy recommended.  Starting chemotherapy for breast cancer on 2/3. Will likely need steroids as premedication.    Recent acute onset of severe, nonradiating epigastric pain after 2 doses of Motrin 800 mg following breast surgery. Seen in the ED 01/29/19 because of the severity of the pain. Associated nausea.  CT showed wall thickening of the gastric antrum.  Symptoms improving on pantoprazole. Although she continues to have some symptoms. Frequently has nocturnal symptoms.  No nausea or vomiting. No change in bowel habits, diarrhea, constipation, diarrhea. This is different from her fleeting reflux. She had an UGI series 20 years ago for reflux that was normal. H pylori testing was negative. Off all medications for 20 years. She has plans to stay on PPI during her chemotherapy.  No prior colonoscopy or colon cancer screening.   Maternal grandfather with colon cancer. Mother with reflux on a PPI. No known family history of colon cancer or polyps. No family history of uterine/endometrial cancer, pancreatic cancer or gastric/stomach cancer.   Past Medical History:  Diagnosis Date  . Cancer [facility])    breast cancer    Past Surgical History:  Procedure Laterality Date  . BREAST LUMPECTOMY WITH RADIOACTIVE SEED AND SENTINEL LYMPH NODE BIOPSY Right 01/27/2019   Procedure: RIGHT BREAST LUMPECTOMY WITH RADIOACTIVE SEED AND SENTINEL LYMPH NODE MAPPING;  Surgeon: Erroll Luna, MD;  Location: [facility];  Service: General;  Laterality: Right;  . COSMETIC SURGERY Bilateral 2000   breast aug/ lipo  . PORTACATH PLACEMENT Right 01/27/2019   Procedure: INSERTION PORT-A-CATH WITH ULTRASOUND;  Surgeon: Erroll Luna, MD;  Location: [facility];  Service: General;  Laterality: Right;    Current Outpatient Medications  Medication Sig Dispense Refill  . ALPRAZolam  (XANAX) 0.25 MG tablet Take 0.25 mg by mouth 3 (three) times daily as needed.    Marland Kitchen atorvastatin (LIPITOR) 40 MG tablet 1 po qhs. Needs bldwrk prior to RF 90 tablet 0  . CALCIUM PO Take 1,000 mg by mouth daily.    Marland Kitchen lidocaine-prilocaine (EMLA) cream Apply to affected area once 30 g 3  . Magnesium 125 MG CAPS Take 1 capsule by mouth daily.     . Melatonin 3 MG CAPS Take 3 mg by mouth at bedtime.     . Multiple Vitamins-Minerals (MULTIVITAMIN WITH MINERALS) tablet Take 1 tablet by mouth daily.    . ondansetron (ZOFRAN ODT) 4 MG disintegrating tablet Take 1 tablet (4 mg total) by mouth every 8 (eight) hours as needed for nausea or vomiting. (Patient not taking: Reported on 02/02/2019) 20 tablet 0  . pantoprazole (PROTONIX) 20 MG tablet Take 2 tablets (40 mg total) by mouth 2 (two) times daily for 14 days. 56 tablet 0  . prochlorperazine (COMPAZINE) 10 MG tablet Take 1 tablet (10 mg total) by mouth every 6 (six) hours as needed (Nausea or vomiting). 30 tablet 1  . traZODone (DESYREL) 50 MG tablet Take 1-2 tablets (50-100 mg total) by mouth at bedtime as needed for sleep. 180 tablet 0  . VITAMIN D PO Take 12,000 Units by mouth daily.     No current facility-administered medications for this visit.    Allergies as of 02/11/2019  . (No Known Allergies)    Family History  Problem Relation Age of Onset  . Diabetes Mother   . Heart disease Mother   . Hyperlipidemia Mother   . Breast cancer Mother 78  . Cancer Mother        breast DCIS  . Cancer Father 44       prostate cancer   . Diabetes Father   . Breast cancer Sister 28  . Cancer Sister 28       breast cancer   . Cancer Maternal Aunt 55       breast cancer   . Cancer Maternal Grandfather        colon cancer    Social History   Socioeconomic History  . Marital status: Married    Spouse name: Not on file  . Number of children: 2  . Years of education: Not on file  . Highest education level: Not on file  Occupational History  .  Not on file  Tobacco Use  . Smoking status: Former Smoker    Packs/day: 0.25    Years: 5.00    Pack years: 1.25    Quit date: 01/20/1978    Years since quitting: 41.0  . Smokeless tobacco: Never Used  . Tobacco comment: 1982  Substance and Sexual Activity  . Alcohol use: Yes    Alcohol/week: 6.0 standard drinks    Types: 6 Glasses of wine per week  . Drug use: No  . Sexual activity: Yes    Birth control/protection: None  Other Topics Concern  . Not on file  Social History Narrative  . Not on file   Social Determinants of Health   Financial Resource Strain:   . Difficulty of Paying Living Expenses: Not on file  Food Insecurity:   . Worried About Charity fundraiser in the Last Year: Not on file  . Ran Out of Food in the Last Year: Not on file  Transportation Needs:   . Lack of  Transportation (Medical): Not on file  . Lack of Transportation (Non-Medical): Not on file  Physical Activity:   . Days of Exercise per Week: Not on file  . Minutes of Exercise per Session: Not on file  Stress:   . Feeling of Stress : Not on file  Social Connections:   . Frequency of Communication with Friends and Family: Not on file  . Frequency of Social Gatherings with Friends and Family: Not on file  . Attends Religious Services: Not on file  . Active Member of Clubs or Organizations: Not on file  . Attends Archivist Meetings: Not on file  . Marital Status: Not on file  Intimate Partner Violence:   . Fear of Current or Ex-Partner: Not on file  . Emotionally Abused: Not on file  . Physically Abused: Not on file  . Sexually Abused: Not on file    Review of Systems: 12 system ROS is negative except as noted above.    Physical Exam: Complete physical exam not performed due to the limits inherent in a telehealth encounter.  General: Awake, alert, and oriented, and well communicative. In no acute distress.  Pulm: No labored breathing, speaking in full sentences without  conversational dyspnea  Psych: Pleasant, cooperative, normal speech, normal affect and normal insight Neuro: Alert and appropriate     Tiarrah Saville L. Tarri Glenn, MD, MPH 02/11/2019, 2:26 PM

## 2019-02-14 ENCOUNTER — Other Ambulatory Visit: Payer: Self-pay | Admitting: Family Medicine

## 2019-02-14 ENCOUNTER — Encounter: Payer: Self-pay | Admitting: *Deleted

## 2019-02-14 ENCOUNTER — Other Ambulatory Visit: Payer: Self-pay

## 2019-02-14 ENCOUNTER — Encounter: Payer: Self-pay | Admitting: Physical Therapy

## 2019-02-14 ENCOUNTER — Ambulatory Visit: Payer: 59 | Attending: Surgery | Admitting: Physical Therapy

## 2019-02-14 ENCOUNTER — Inpatient Hospital Stay: Payer: 59

## 2019-02-14 DIAGNOSIS — M25611 Stiffness of right shoulder, not elsewhere classified: Secondary | ICD-10-CM | POA: Insufficient documentation

## 2019-02-14 DIAGNOSIS — Z483 Aftercare following surgery for neoplasm: Secondary | ICD-10-CM | POA: Diagnosis present

## 2019-02-14 DIAGNOSIS — R293 Abnormal posture: Secondary | ICD-10-CM | POA: Diagnosis present

## 2019-02-14 DIAGNOSIS — Z170 Estrogen receptor positive status [ER+]: Secondary | ICD-10-CM | POA: Diagnosis present

## 2019-02-14 DIAGNOSIS — C50411 Malignant neoplasm of upper-outer quadrant of right female breast: Secondary | ICD-10-CM

## 2019-02-14 DIAGNOSIS — E785 Hyperlipidemia, unspecified: Secondary | ICD-10-CM

## 2019-02-14 MED ORDER — ATORVASTATIN CALCIUM 40 MG PO TABS: ORAL_TABLET | ORAL | 0 refills | Status: DC

## 2019-02-14 NOTE — Telephone Encounter (Signed)
Please advise patient that RX has been sent electronically to [facility]. AS< CMA

## 2019-02-14 NOTE — Therapy (Signed)
[place], [place], [address] Phone: [phone]   Fax:  [phone]  Physical Therapy Treatment  Patient Details  Name: Alicia Hahn MRN: [id number] Date of Birth: Mar 14, 1960 Referring Provider (PT): Dr. Erroll Luna   Encounter Date: 02/14/2019  PT End of Session - 02/14/19 1603    Visit Number  2    Number of Visits  2    PT Start Time  1502    PT Stop Time  1550    PT Time Calculation (min)  48 min    Activity Tolerance  Patient tolerated treatment well    Behavior During Therapy  [facility] for tasks assessed/performed       Past Medical History:  Diagnosis Date  . Cancer [facility])    breast cancer    Past Surgical History:  Procedure Laterality Date  . BREAST LUMPECTOMY WITH RADIOACTIVE SEED AND SENTINEL LYMPH NODE BIOPSY Right 01/27/2019   Procedure: RIGHT BREAST LUMPECTOMY WITH RADIOACTIVE SEED AND SENTINEL LYMPH NODE MAPPING;  Surgeon: Erroll Luna, MD;  Location: [facility];  Service: General;  Laterality: Right;  . COSMETIC SURGERY Bilateral 2000   breast aug/ lipo  . PORTACATH PLACEMENT Right 01/27/2019   Procedure: INSERTION PORT-A-CATH WITH ULTRASOUND;  Surgeon: Erroll Luna, MD;  Location: [facility];  Service: General;  Laterality: Right;    There were no vitals filed for this visit.  Subjective Assessment - 02/14/19 1507    Subjective  Patient reports she underwent a right lumpectomy and sentinel node biopsy (0/4 nodes positive) on 01/27/2019. She begins chemotherapy on 02/23/2019 followed by radiation.    Pertinent History  Patient was diagnosed on 12/20/2018 with right triple positive breast cancer. She had a right lumpectomy and sentinel node biopsy (0/4 nodes positive) on 01/27/2019. She has a history of bilateral retropectoral implants placed in 2000.    Patient Stated Goals  See if my arm is doing ok    Currently in Pain?  No/denies          [facility] PT Assessment - 02/14/19 0001      Assessment   Medical Diagnosis  s/p right lumpectomy and SLNB    Referring Provider (PT)  Dr. Marcello Moores Cornett    Onset Date/Surgical Date  01/27/19    Hand Dominance  Right    Prior Therapy  Baselines      Precautions   Precautions  Other (comment)    Precaution Comments  recent surgery; right arm lymphedema risk      Restrictions   Weight Bearing Restrictions  No      Balance Screen   Has the patient fallen in the past 6 months  No    Has the patient had a decrease in activity level because of a fear of falling?   No    Is the patient reluctant to leave their home because of a fear of falling?   No      Home Film/video editor residence    Living Arrangements  Spouse/significant other    Available Help at Discharge  Family      Prior Function   Level of Independence  Independent    Vocation  Retired    Biomedical scientist  Retired Therapist, sports    Leisure  She is walking on the treadmill 4-5x/week for 30 minutes      Cognition   Overall Cognitive Status  Within Functional Limits for tasks assessed  Posture/Postural Control   Posture/Postural Control  Postural limitations    Postural Limitations  Forward head;Rounded Shoulders      ROM / Strength   AROM / PROM / Strength  AROM      AROM   AROM Assessment Site  Shoulder    Right/Left Shoulder  Right    Right Shoulder Extension  42 Degrees    Right Shoulder Flexion  140 Degrees    Right Shoulder ABduction  146 Degrees    Right Shoulder Internal Rotation  72 Degrees    Right Shoulder External Rotation  86 Degrees        LYMPHEDEMA/ONCOLOGY QUESTIONNAIRE - 02/14/19 1514      Type   Cancer Type  s/p right lumpectomy and SLNB      Surgeries   Lumpectomy Date  01/27/19    Sentinel Lymph Node Biopsy Date  01/27/19    Number Lymph Nodes Removed  4      Treatment   Active Chemotherapy Treatment  No    Past Chemotherapy Treatment  No    Active  Radiation Treatment  No    Past Radiation Treatment  No    Current Hormone Treatment  No    Past Hormone Therapy  No      What other symptoms do you have   Are you Having Heaviness or Tightness  No    Are you having Pain  No    Are you having pitting edema  No    Is it Hard or Difficult finding clothes that fit  No    Do you have infections  No    Is there Decreased scar mobility  No    Stemmer Sign  No    Other Symptoms  n      Lymphedema Assessments   Lymphedema Assessments  Upper extremities      Right Upper Extremity Lymphedema   10 cm Proximal to Olecranon Process  28.9 cm    Olecranon Process  25.2 cm    10 cm Proximal to Ulnar Styloid Process  23.3 cm    Just Proximal to Ulnar Styloid Process  15.1 cm    Across Hand at [employer]  17.5 cm    At [place] of 2nd Digit  6.1 cm      Left Upper Extremity Lymphedema   10 cm Proximal to Olecranon Process  30.2 cm    Olecranon Process  24.3 cm    10 cm Proximal to Ulnar Styloid Process  21.5 cm    Just Proximal to Ulnar Styloid Process  14.9 cm    Across Hand at [employer]  17.1 cm    At [place] of 2nd Digit  5.9 cm        Quick Dash - 02/14/19 0001    Open a tight or new jar  No difficulty    Do heavy household chores (wash walls, wash floors)  No difficulty    Carry a shopping bag or briefcase  No difficulty    Wash your back  No difficulty    Use a knife to cut food  No difficulty    Recreational activities in which you take some force or impact through your arm, shoulder, or hand (golf, hammering, tennis)  No difficulty    During the past week, to what extent has your arm, shoulder or hand problem interfered with your normal social activities with family, friends, neighbors, or groups?  Not at all  During the past week, to what extent has your arm, shoulder or hand problem limited your work or other regular daily activities  Not at all    Arm, shoulder, or hand pain.  Mild    Tingling (pins and needles) in your  arm, shoulder, or hand  None    Difficulty Sleeping  No difficulty    DASH Score  2.27 %                     PT Education - 02/14/19 1602    Education Details  Reviewed HEP and lymphedema risk reduction; pt got signed up for the ABC class    Person(s) Educated  Patient    Methods  Explanation;Demonstration;Handout    Comprehension  Returned demonstration;Verbalized understanding          PT Long Term Goals - 02/14/19 1606      PT LONG TERM GOAL #1   Title  Patient will demonstrate she has regained full shoulder ROM and function post operatively compared to baselines.    Time  8    Period  Weeks    Status  Achieved            Plan - 02/14/19 1603    Clinical Impression Statement  Patient is doing very well 3 weeks s/p right lumpectomy and sentinel node biopsy. She begins chemotherapy next week as she has triple positive breast cancer. Her shoulder ROM is within a few degrees of where she was at baseline. There is no sign of lymphedema and her incisions are well healed. She does have a small seroma present in her right axillary region. Compression foam was placed between her skin and bra today to try to reduce that. She sees her surgeon on 02/18/2019 and will ask him to examine that. Otherwise, she has no needs for PT at this time.    PT Treatment/Interventions  ADLs/Self Care Home Management;Therapeutic exercise;Patient/family education    PT Next Visit Plan  D/C    PT Home Exercise Plan  Post op shoulder ROM HEP    Consulted and Agree with Plan of Care  Patient       Patient will benefit from skilled therapeutic intervention in order to improve the following deficits and impairments:  Postural dysfunction, Decreased knowledge of precautions, Pain, Impaired UE functional use, Decreased range of motion  Visit Diagnosis: Malignant neoplasm of upper-outer quadrant of right breast in female, estrogen receptor positive (HCC)  Abnormal posture  Stiffness of right  shoulder, not elsewhere classified  Aftercare following surgery for neoplasm     Problem List Patient Active Problem List   Diagnosis Date Noted  . Cervical radiculopathy at C7 on L  02/02/2019  . Psychophysiological insomnia 02/02/2019  . Numbness and tingling of hand-  C7 distribution L hand 02/02/2019  . Stress and adjustment reaction 02/02/2019  . Genetic testing 12/30/2018  . Malignant neoplasm of upper-outer quadrant of right breast in female, estrogen receptor positive ([place]) 12/28/2018  . Malignant tumor of breast ([place]) 12/22/2018  . Family history of breast cancer in sister(age33) and mother 11/02/2018  . Plantar fasciitis of left foot 11/02/2018  . Muscle ache of extremity-bilateral calf muscles 11/02/2018  . Impingement syndrome of right shoulder 05/11/2017   PHYSICAL THERAPY DISCHARGE SUMMARY  Visits from Start of Care: 2  Current functional level related to goals / functional outcomes: Goals met. See above for objective findings.   Remaining deficits: Seroma present in right axilla. Mild ROM deficit  which will improve as she continues stretching.   Education / Equipment: HEP and lymphedema risk reduction. Plan: Patient agrees to discharge.  Patient goals were met. Patient is being discharged due to meeting the stated rehab goals.  ?????         Annia Friendly, [place] 02/14/19 4:08 PM  [place], [place], [address] Phone: [phone]   Fax:  [phone]  Name: Alicia Hahn MRN: [id number] Date of Birth: 11/30/1960

## 2019-02-14 NOTE — Telephone Encounter (Signed)
Patient cld states Tonya confirmed this Rx send to pharmacy:  Outpatient Medication Detail   Disp Refills Start End   atorvastatin (LIPITOR) 40 MG tablet 90 tablet 0 02/11/2019    Sig: 1 po qhs.  Needs bldwrk prior to RF   Class: Print    Message to med asst--Pharmacy doesn't show receipt:  ( please resubmit to)  [place], [place] [phone] (Phone) [phone] (Fax)   --glh

## 2019-02-22 ENCOUNTER — Other Ambulatory Visit: Payer: Self-pay

## 2019-02-22 DIAGNOSIS — Z170 Estrogen receptor positive status [ER+]: Secondary | ICD-10-CM

## 2019-02-22 DIAGNOSIS — C50411 Malignant neoplasm of upper-outer quadrant of right female breast: Secondary | ICD-10-CM

## 2019-02-22 NOTE — Progress Notes (Signed)
[place]   [phone] [phone]   Clinic Follow up Note   Patient Care Team: Mellody Dance, DO as PCP - General (Family Medicine) Mcarthur Rossetti, MD as Consulting Physician (Orthopedic Surgery) Molli Posey, MD as Consulting Physician (Obstetrics and Gynecology) Erroll Luna, MD as Consulting Physician (General Surgery) Truitt Merle, MD as Consulting Physician (Hematology) Kyung Rudd, MD as Consulting Physician ([place]) Mauro Kaufmann, RN as Oncology Nurse Navigator Rockwell Germany, RN as Oncology Nurse Navigator 02/23/2019  CHIEF COMPLAINT: Follow-up right breast cancer  SUMMARY OF ONCOLOGIC HISTORY: Oncology History Overview Note  Cancer Staging Malignant neoplasm of upper-outer quadrant of right breast in female, estrogen receptor positive ([place]) Staging form: Breast, AJCC 8th Edition - Clinical stage from 12/29/2018: Stage IA (cT1b, cN0, cM0, G2, ER+, PR+, HER2+) - Unsigned - Pathologic stage from 01/27/2019: Stage IA (pT1c, pN0, cM0, G3, ER+, PR+, HER2+) - Signed by Truitt Merle, MD on 02/06/2019 Cancer Staging No matching staging information was found for the patient.    Malignant neoplasm of upper-outer quadrant of right breast in female, estrogen receptor positive ([place])  12/22/2018 Mammogram    Diagnostic Mammogram 12/22/18  IMPRESSION: 1. 9 mm mass, with associated calcifications and architectural distortion, in the 11 o'clock position of the right breast, highly suspicious for breast carcinoma. 2. Questionable mass noted in the inferior aspect of the right breast remains equivocal on diagnostic imaging may reflect focal fibroglandular tissue. There is no sonographic correlate.   12/23/2018 Initial Biopsy   Diagnosis 12/23/18 Breast, right, needle core biopsy, 11 o'clock position - INVASIVE DUCTAL CARCINOMA, SEE COMMENT. - DUCTAL CARCINOMA IN SITU WITH NECROSIS.   12/28/2018 Initial Diagnosis   Malignant  neoplasm of upper-outer quadrant of right breast in female, estrogen receptor positive ([place])   01/13/2019 Imaging   MRI Breast 01/13/19  IMPRESSION: 1. Biopsy proven malignancy in the upper-outer right breast. No additional suspicious findings on the right. 2. No MRI evidence of malignancy on the left. 3. No suspicious lymphadenopathy.   01/27/2019 Surgery   RIGHT BREAST LUMPECTOMY WITH RADIOACTIVE SEED AND SENTINEL LYMPH NODE MAPPING and PAC placement by Dr Brantley Stage  01/27/19   01/27/2019 Pathology Results   FINAL MICROSCOPIC DIAGNOSIS: 01/27/19  A. BREAST, RIGHT, LUMPECTOMY:  - Invasive ductal carcinoma, grade 3, spanning 1.3 cm.  - High grade ductal carcinoma in situ with necrosis.  - Biopsy site.  - Final resection margins are negative for carcinoma.  - See oncology table.   B. BREAST, RIGHT ADDITIONAL SUPERIOR MARGIN, EXCISION:  - Fibrocystic change and adenosis.  - No malignancy identified.   C. LYMPH NODE, RIGHT #1, SENTINEL, BIOPSY:  - One of one lymph nodes negative for carcinoma (0/1).   D. LYMPH NODE, RIGHT, SENTINEL, BIOPSY:  - One of one lymph nodes negative for carcinoma (0/1).   E. LYMPH NODE, RIGHT, SENTINEL, BIOPSY:  - One of one lymph nodes negative for carcinoma (0/1).   F. LYMPH NODE, RIGHT, SENTINEL, BIOPSY:  - One of one lymph nodes negative for carcinoma (0/1).       01/27/2019 Cancer Staging   Staging form: Breast, AJCC 8th Edition - Pathologic stage from 01/27/2019: Stage IA (pT1c, pN0, cM0, G3, ER+, PR+, HER2+) - Signed by Truitt Merle, MD on 02/06/2019   02/08/2019 Imaging   CT AP W Contrast 02/08/19  IMPRESSION: 1. Wall thickening of the gastric antrum may represent gastritis.   Aortic Atherosclerosis (ICD10-I70.0).   02/23/2019 -  Chemotherapy   The patient  had PACLitaxel-protein bound (ABRAXANE) chemo infusion 175 mg, 100 mg/m2 = 175 mg (100 % of original dose 100 mg/m2), Intravenous,  Once, 0 of 3 cycles Dose modification: 100 mg/m2 (original dose 100  mg/m2, Cycle 1)  for chemotherapy treatment.    02/23/2019 -  Chemotherapy   The patient had trastuzumab-anns (KANJINTI) 600 mg in sodium chloride 0.9 % 250 mL chemo infusion, 600 mg (100 % of original dose 600 mg), Intravenous,  Once, 0 of 6 cycles Dose modification: 600 mg (original dose 600 mg, Cycle 1, Reason: Other (see comments), Comment: Loading dose), 450 mg (Cycle 2, Reason: Other (see comments), Comment: biosimilar conversion)  for chemotherapy treatment.      CURRENT THERAPY:  Adjuvant weekly Abraxane with Herceptin q3weeks starting 02/23/19  INTERVAL HISTORY: Alicia Hahn returns for f/u and treatment as scheduled. She is doing well overall. Epigastric pain is much better, still has some "gnawing" mostly at night. protonix is helping. Able to eat and drink. She had endoscopy planned this Friday. She started lipitor approx 2 weeks ago, tolerating well. Denies fatigue, decreased appetite, n/v/c/d, fever, chills, cough, chest pain, dyspnea, leg swelling. No black or bloody stools or hematemesis. She has tingling in left hand index finger from "C7 compression," otherwise denies neuropathy. Breast incisions doing well. Saw Dr. Brantley Stage last week.    MEDICAL HISTORY:  Past Medical History:  Diagnosis Date  . Cancer [facility])    breast cancer    SURGICAL HISTORY: Past Surgical History:  Procedure Laterality Date  . BREAST LUMPECTOMY WITH RADIOACTIVE SEED AND SENTINEL LYMPH NODE BIOPSY Right 01/27/2019   Procedure: RIGHT BREAST LUMPECTOMY WITH RADIOACTIVE SEED AND SENTINEL LYMPH NODE MAPPING;  Surgeon: Erroll Luna, MD;  Location: [place];  Service: General;  Laterality: Right;  . COSMETIC SURGERY Bilateral 2000   breast aug/ lipo  . PORTACATH PLACEMENT Right 01/27/2019   Procedure: INSERTION PORT-A-CATH WITH ULTRASOUND;  Surgeon: Erroll Luna, MD;  Location: [place];  Service: General;  Laterality: Right;    I have reviewed the social history and  family history with the patient and they are unchanged from previous note.  ALLERGIES:  has No Known Allergies.  MEDICATIONS:  Current Outpatient Medications  Medication Sig Dispense Refill  . ALPRAZolam (XANAX) 0.25 MG tablet Take 0.25 mg by mouth 3 (three) times daily as needed.    Marland Kitchen atorvastatin (LIPITOR) 40 MG tablet 1 po qhs. 90 tablet 0  . CALCIUM PO Take 1,000 mg by mouth daily.    Marland Kitchen lidocaine-prilocaine (EMLA) cream Apply to affected area once 30 g 3  . Magnesium 125 MG CAPS Take 1 capsule by mouth daily.     . Melatonin 3 MG CAPS Take 3 mg by mouth at bedtime.     . Multiple Vitamins-Minerals (MULTIVITAMIN WITH MINERALS) tablet Take 1 tablet by mouth daily.    . pantoprazole (PROTONIX) 40 MG tablet Take 1 tablet (40 mg total) by mouth daily. 30 tablet 3  . prochlorperazine (COMPAZINE) 10 MG tablet Take 1 tablet (10 mg total) by mouth every 6 (six) hours as needed (Nausea or vomiting). 30 tablet 1  . traZODone (DESYREL) 50 MG tablet Take 1-2 tablets (50-100 mg total) by mouth at bedtime as needed for sleep. 180 tablet 0  . VITAMIN D PO Take 12,000 Units by mouth daily.    . ondansetron (ZOFRAN ODT) 4 MG disintegrating tablet Take 1 tablet (4 mg total) by mouth every 8 (eight) hours as needed for nausea or  vomiting. (Patient not taking: Reported on 02/02/2019) 20 tablet 0   No current facility-administered medications for this visit.    PHYSICAL EXAMINATION: ECOG PERFORMANCE STATUS: 0 - Asymptomatic  Vitals:   02/23/19 0920  BP: 121/74  Pulse: 80  Resp: 18  Temp: 98.2 F (36.8 C)  SpO2: 99%   Filed Weights   02/23/19 0920  Weight: 174 lb 6.4 oz (79.1 kg)    GENERAL:alert, no distress and comfortable SKIN: no rash  EYES:  sclera clear NECK: without mass  LYMPH:  no palpable supraclavicular lymphadenopathy  LUNGS: clear with normal breathing effort HEART: regular rate & rhythm, no lower extremity edema ABDOMEN:abdomen soft, non-tender and normal bowel sounds NEURO:  alert & oriented x 3 with fluent speech, no focal motor/sensory deficits PAC without erythema  Breast: s/p right lumpectomy. Small axillary seroma. Axillary and areolar incisions healing well with granulation tissue. No erythema or drainage. Small right breast edema   LABORATORY DATA:  I have reviewed the data as listed CBC Latest Ref Rng & Units 02/23/2019 02/02/2019 01/29/2019  WBC 4.0 - 10.5 K/uL 4.8 5.9 7.6  Hemoglobin 12.0 - 15.0 g/dL 13.6 14.5 14.2  Hematocrit 36.0 - 46.0 % 40.5 42.9 42.8  Platelets 150 - 400 K/uL 200 196 214     CMP Latest Ref Rng & Units 02/23/2019 02/02/2019 01/29/2019  Glucose 70 - 99 mg/dL 101(H) 106(H) 107(H)  BUN 6 - 20 mg/dL '13 13 17  '$ Creatinine 0.44 - 1.00 mg/dL 0.76 0.72 0.78  Sodium 135 - 145 mmol/L 141 142 137  Potassium 3.5 - 5.1 mmol/L 4.0 4.1 3.9  Chloride 98 - 111 mmol/L 108 103 103  CO2 22 - 32 mmol/L '27 23 26  '$ Calcium 8.9 - 10.3 mg/dL 8.9 9.5 9.7  Total Protein 6.5 - 8.1 g/dL 6.5 6.9 6.6  Total Bilirubin 0.3 - 1.2 mg/dL 0.3 0.3 0.6  Alkaline Phos 38 - 126 U/L 88 75 54  AST 15 - 41 U/L 36 19 26  ALT 0 - 44 U/L 65(H) 36(H) 33      RADIOGRAPHIC STUDIES: I have personally reviewed the radiological images as listed and agreed with the findings in the report. No results found.   ASSESSMENT & PLAN: Alicia Hahn is a 59 y.o. female with    1.Malignant neoplasm of upper-outer quadrant of right breast,invasive ductal carcinoma,pT1cN0M0,stageIA,Triple Positive,Grade3 -Diagnosed in 12/2018. S/p right lumpectomy and SLNB on 01/27/19 by Dr Brantley Stage. Pathology showed 1.3cm tumor removed, clear margins and 3 nodes negative.  -Given her tumor size and HER2 + disease, she has higher recurrence risk. Dr. Burr Medico recommended adjuvant chemotherapy to reduce this risk. Goal is curative.  -She consented to weekly abraxane x12 cycles and q3 weeks Herceptin for 1 year. Avoiding steroid premeds for her gastritis  -after Abraxane she will proceed with  adjuvant radiation and anti-estrogen therapy  -Her baseline Echo was normal. She will continue to f/u with Dr Haroldine Laws to monitor her heart function.  -Alicia Hahn appears well today. She recovered from surgery. Labs adequate to proceed. She has mild ALT elevation which may be related to starting Lipitor. Will monitor closely.  -we reviewed anticipated chemo side effects, symptom management, and rationale for cryotherapy during infusion. She is ready to proceed -Proceed with cycle 1 abraxane and kanjinti (herceptin biosimilar) today, continue weekly abraxane and q3 weeks herceptin -f/u next week with cycle 2 abraxane. If she does well, will follow up every other cycle.    2. Gastritis/epigastric pain -After surgery  she took Motrin and started having severe epigastric pain and required ED visit.  -pain much improved after starting protonix, pain now mostly at night. -endoscopy scheduled on 02/25/19 with Dr. Tarri Glenn. She knows to monitor for worsening pain, n/v, bleeding, fever or chills after procedure.   3. Estrogen replacement andpostmenopausal vaginalbleeding -Started after 2nd Mirena was taken out. Her Gyn put her on a third Mirena and bleeding stopped.  Vernia Buff on estrogen replacement for several years for hot flashes per GYN -Mirena was removed in Dec 2020 per Dr. Ernestina Penna recommendation, no recurrent vaginal bleeding or issues  4. Genetics  -Based on family history her father has prostate cancer and she had significant family history of breast cancer. She is eligible for genetic testing and was had this done at [facility].  -the result dated 06/01/2016 showed no pathogenic mutation. VUS in ATM [id number]. The report noted a 30.5% lifetime breast cancer risk. See Media for full report  PLAN: -Labs reviewed -Proceed with cycle 1 abraxane (weekly x12 cycles) and Herceptin biosimilar (q3 weeks) today -Reviewed symptom management for chemo side effects  -F/u next week with  cycle 2 abraxane -EGD per Dr. Tarri Glenn on 2/5, continue protonix -Reviewed infection, bleeding precautions -Encouraged to get COVID19 vaccine when it becomes available to her  No problem-specific Assessment & Plan notes found for this encounter.   No orders of the defined types were placed in this encounter.  All questions were answered. The patient knows to call the clinic with any problems, questions or concerns. No barriers to learning was detected.     Alla Feeling, NP 02/23/19

## 2019-02-22 NOTE — Progress Notes (Signed)
The following biosimilar Kanjinti (trastuzumab-anns) has been selected for use in this patient.  Kennith Center, Pharm.D., CPP 02/22/2019@11 :58 AM

## 2019-02-23 ENCOUNTER — Inpatient Hospital Stay: Payer: 59 | Attending: Hematology

## 2019-02-23 ENCOUNTER — Inpatient Hospital Stay: Payer: 59 | Admitting: Nurse Practitioner

## 2019-02-23 ENCOUNTER — Other Ambulatory Visit: Payer: Self-pay | Admitting: Gastroenterology

## 2019-02-23 ENCOUNTER — Inpatient Hospital Stay: Payer: 59

## 2019-02-23 ENCOUNTER — Encounter: Payer: Self-pay | Admitting: Nurse Practitioner

## 2019-02-23 ENCOUNTER — Encounter: Payer: Self-pay | Admitting: *Deleted

## 2019-02-23 ENCOUNTER — Other Ambulatory Visit: Payer: Self-pay

## 2019-02-23 ENCOUNTER — Ambulatory Visit (INDEPENDENT_AMBULATORY_CARE_PROVIDER_SITE_OTHER): Payer: 59

## 2019-02-23 VITALS — BP 121/74 | HR 80 | Temp 98.2°F | Resp 18 | Ht 63.0 in | Wt 174.4 lb

## 2019-02-23 DIAGNOSIS — Z170 Estrogen receptor positive status [ER+]: Secondary | ICD-10-CM

## 2019-02-23 DIAGNOSIS — Z95828 Presence of other vascular implants and grafts: Secondary | ICD-10-CM

## 2019-02-23 DIAGNOSIS — C50411 Malignant neoplasm of upper-outer quadrant of right female breast: Secondary | ICD-10-CM

## 2019-02-23 DIAGNOSIS — Z5111 Encounter for antineoplastic chemotherapy: Secondary | ICD-10-CM | POA: Insufficient documentation

## 2019-02-23 DIAGNOSIS — Z5112 Encounter for antineoplastic immunotherapy: Secondary | ICD-10-CM | POA: Insufficient documentation

## 2019-02-23 DIAGNOSIS — Z1159 Encounter for screening for other viral diseases: Secondary | ICD-10-CM

## 2019-02-23 LAB — CMP (CANCER CENTER ONLY)
ALT: 65 U/L — ABNORMAL HIGH (ref 0–44)
AST: 36 U/L (ref 15–41)
Albumin: 3.8 g/dL (ref 3.5–5.0)
Alkaline Phosphatase: 88 U/L (ref 38–126)
Anion gap: 6 (ref 5–15)
BUN: 13 mg/dL (ref 6–20)
CO2: 27 mmol/L (ref 22–32)
Calcium: 8.9 mg/dL (ref 8.9–10.3)
Chloride: 108 mmol/L (ref 98–111)
Creatinine: 0.76 mg/dL (ref 0.44–1.00)
GFR, Est AFR Am: >60 mL/min (ref >60)
GFR, Estimated: >60 mL/min (ref >60)
Glucose, Bld: 101 mg/dL — ABNORMAL HIGH (ref 70–99)
Potassium: 4 mmol/L (ref 3.5–5.1)
Sodium: 141 mmol/L (ref 135–145)
Total Bilirubin: 0.3 mg/dL (ref 0.3–1.2)
Total Protein: 6.5 g/dL (ref 6.5–8.1)

## 2019-02-23 LAB — CBC WITH DIFFERENTIAL (CANCER CENTER ONLY)
Abs Immature Granulocytes: 0.01 10*3/uL (ref 0.00–0.07)
Basophils Absolute: 0 10*3/uL (ref 0.0–0.1)
Basophils Relative: 1 %
Eosinophils Absolute: 0.1 10*3/uL (ref 0.0–0.5)
Eosinophils Relative: 3 %
HCT: 40.5 % (ref 36.0–46.0)
Hemoglobin: 13.6 g/dL (ref 12.0–15.0)
Immature Granulocytes: 0 %
Lymphocytes Relative: 34 %
Lymphs Abs: 1.7 10*3/uL (ref 0.7–4.0)
MCH: 30.8 pg (ref 26.0–34.0)
MCHC: 33.6 g/dL (ref 30.0–36.0)
MCV: 91.6 fL (ref 80.0–100.0)
Monocytes Absolute: 0.6 10*3/uL (ref 0.1–1.0)
Monocytes Relative: 12 %
Neutro Abs: 2.4 10*3/uL (ref 1.7–7.7)
Neutrophils Relative %: 50 %
Platelet Count: 200 10*3/uL (ref 150–400)
RBC: 4.42 MIL/uL (ref 3.87–5.11)
RDW: 11.7 % (ref 11.5–15.5)
WBC Count: 4.8 10*3/uL (ref 4.0–10.5)
nRBC: 0 % (ref 0.0–0.2)

## 2019-02-23 LAB — SARS CORONAVIRUS 2 (TAT 6-24 HRS): SARS Coronavirus 2: NEGATIVE

## 2019-02-23 MED ORDER — TRASTUZUMAB-ANNS CHEMO 150 MG IV SOLR
600.0000 mg | Freq: Once | INTRAVENOUS | Status: AC
  Administered 2019-02-23: 600 mg via INTRAVENOUS
  Filled 2019-02-23: qty 28.57

## 2019-02-23 MED ORDER — PACLITAXEL PROTEIN-BOUND CHEMO INJECTION 100 MG
100.0000 mg/m2 | Freq: Once | INTRAVENOUS | Status: AC
  Administered 2019-02-23: 175 mg via INTRAVENOUS
  Filled 2019-02-23: qty 35

## 2019-02-23 MED ORDER — PROCHLORPERAZINE MALEATE 10 MG PO TABS
10.0000 mg | ORAL_TABLET | Freq: Four times a day (QID) | ORAL | Status: DC | PRN
  Administered 2019-02-23: 10 mg via ORAL

## 2019-02-23 MED ORDER — SODIUM CHLORIDE 0.9% FLUSH
10.0000 mL | INTRAVENOUS | Status: DC | PRN
  Administered 2019-02-23: 10 mL
  Filled 2019-02-23: qty 10

## 2019-02-23 MED ORDER — SODIUM CHLORIDE 0.9 % IV SOLN
Freq: Once | INTRAVENOUS | Status: AC
  Filled 2019-02-23: qty 250

## 2019-02-23 MED ORDER — HEPARIN SOD (PORK) LOCK FLUSH 100 UNIT/ML IV SOLN
500.0000 [IU] | Freq: Once | INTRAVENOUS | Status: AC | PRN
  Administered 2019-02-23: 500 [IU]
  Filled 2019-02-23: qty 5

## 2019-02-23 MED ORDER — ACETAMINOPHEN 325 MG PO TABS
650.0000 mg | ORAL_TABLET | Freq: Once | ORAL | Status: AC
  Administered 2019-02-23: 650 mg via ORAL

## 2019-02-23 MED ORDER — DIPHENHYDRAMINE HCL 25 MG PO CAPS
50.0000 mg | ORAL_CAPSULE | Freq: Once | ORAL | Status: AC
  Administered 2019-02-23: 50 mg via ORAL

## 2019-02-23 MED ORDER — SODIUM CHLORIDE 0.9% FLUSH
10.0000 mL | INTRAVENOUS | Status: DC | PRN
  Administered 2019-02-23: 09:00:00 10 mL via INTRAVENOUS
  Filled 2019-02-23: qty 10

## 2019-02-23 NOTE — Progress Notes (Addendum)
[place]   [phone] Fax:([phone]) [phone]   Clinic Follow up Note   Patient Care Team: Mellody Dance, DO as PCP - General (Family Medicine) Mcarthur Rossetti, MD as Consulting Physician (Orthopedic Surgery) Molli Posey, MD as Consulting Physician (Obstetrics and Gynecology) Erroll Luna, MD as Consulting Physician (General Surgery) Truitt Merle, MD as Consulting Physician (Hematology) Kyung Rudd, MD as Consulting Physician ([place]) Mauro Kaufmann, RN as Oncology Nurse Navigator Rockwell Germany, RN as Oncology Nurse Navigator  Date of Service:  03/02/2019  CHIEF COMPLAINT: Follow-up right breast cancer  SUMMARY OF ONCOLOGIC HISTORY: Oncology History Overview Note  Cancer Staging Malignant neoplasm of upper-outer quadrant of right breast in female, estrogen receptor positive ([place]) Staging form: Breast, AJCC 8th Edition - Clinical stage from 12/29/2018: Stage IA (cT1b, cN0, cM0, G2, ER+, PR+, HER2+) - Unsigned - Pathologic stage from 01/27/2019: Stage IA (pT1c, pN0, cM0, G3, ER+, PR+, HER2+) - Signed by Truitt Merle, MD on 02/06/2019 Cancer Staging No matching staging information was found for the patient.    Malignant neoplasm of upper-outer quadrant of right breast in female, estrogen receptor positive ([place])  12/22/2018 Mammogram    Diagnostic Mammogram 12/22/18  IMPRESSION: 1. 9 mm mass, with associated calcifications and architectural distortion, in the 11 o'clock position of the right breast, highly suspicious for breast carcinoma. 2. Questionable mass noted in the inferior aspect of the right breast remains equivocal on diagnostic imaging may reflect focal fibroglandular tissue. There is no sonographic correlate.   12/23/2018 Initial Biopsy   Diagnosis 12/23/18 Breast, right, needle core biopsy, 11 o'clock position - INVASIVE DUCTAL CARCINOMA, SEE COMMENT. - DUCTAL CARCINOMA IN SITU WITH NECROSIS.   12/28/2018 Initial  Diagnosis   Malignant neoplasm of upper-outer quadrant of right breast in female, estrogen receptor positive ([place])   01/13/2019 Imaging   MRI Breast 01/13/19  IMPRESSION: 1. Biopsy proven malignancy in the upper-outer right breast. No additional suspicious findings on the right. 2. No MRI evidence of malignancy on the left. 3. No suspicious lymphadenopathy.   01/27/2019 Surgery   RIGHT BREAST LUMPECTOMY WITH RADIOACTIVE SEED AND SENTINEL LYMPH NODE MAPPING and PAC placement by Dr Brantley Stage  01/27/19   01/27/2019 Pathology Results   FINAL MICROSCOPIC DIAGNOSIS: 01/27/19  A. BREAST, RIGHT, LUMPECTOMY:  - Invasive ductal carcinoma, grade 3, spanning 1.3 cm.  - High grade ductal carcinoma in situ with necrosis.  - Biopsy site.  - Final resection margins are negative for carcinoma.  - See oncology table.   B. BREAST, RIGHT ADDITIONAL SUPERIOR MARGIN, EXCISION:  - Fibrocystic change and adenosis.  - No malignancy identified.   C. LYMPH NODE, RIGHT #1, SENTINEL, BIOPSY:  - One of one lymph nodes negative for carcinoma (0/1).   D. LYMPH NODE, RIGHT, SENTINEL, BIOPSY:  - One of one lymph nodes negative for carcinoma (0/1).   E. LYMPH NODE, RIGHT, SENTINEL, BIOPSY:  - One of one lymph nodes negative for carcinoma (0/1).   F. LYMPH NODE, RIGHT, SENTINEL, BIOPSY:  - One of one lymph nodes negative for carcinoma (0/1).       01/27/2019 Cancer Staging   Staging form: Breast, AJCC 8th Edition - Pathologic stage from 01/27/2019: Stage IA (pT1c, pN0, cM0, G3, ER+, PR+, HER2+) - Signed by Truitt Merle, MD on 02/06/2019   02/08/2019 Imaging   CT AP W Contrast 02/08/19  IMPRESSION: 1. Wall thickening of the gastric antrum may represent gastritis.   Aortic Atherosclerosis (ICD10-I70.0).   02/23/2019 -  Chemotherapy   Adjuvant weekly Abraxane with Herceptin q3weeks starting 02/23/19      CURRENT THERAPY:  Adjuvant weekly Abraxane with Herceptin q3weeks starting 02/23/19  INTERVAL HISTORY:  Alicia Hahn is here for a follow up and treatment. She presents to the clinic alone. She notes her start of treatment last week went well. She notes after her infusion she felt tightness of her upper right chest and felt her supraclavicular LN might have been swollen then resolved. She denies nausea and notes only a few episodes of manageable diarrhea. She also notes a few episodes of soreness and pain of knees, hips, groin and ribs. She felt only mild fatigue.   She had endoscopy with Dr. Tarri Glenn on 2/5 which showed ulcers. She is on Protonix until she completes chemo and plans to repeat endoscopy in 3 months.     REVIEW OF SYSTEMS:   Constitutional: Denies fevers, chills or abnormal weight loss Eyes: Denies blurriness of vision Ears, nose, mouth, throat, and face: Denies mucositis or sore throat Respiratory: Denies cough, dyspnea or wheezes Cardiovascular: Denies palpitation, chest discomfort or lower extremity swelling Gastrointestinal:  Denies nausea, heartburn or change in bowel habits Skin: Denies abnormal skin rashes Lymphatics: Denies new lymphadenopathy or easy bruising Neurological:Denies numbness, tingling or new weaknesses Behavioral/Psych: Mood is stable, no new changes  All other systems were reviewed with the patient and are negative.  MEDICAL HISTORY:  Past Medical History:  Diagnosis Date  . Cancer [facility])    breast cancer  . Hyperlipidemia     SURGICAL HISTORY: Past Surgical History:  Procedure Laterality Date  . BREAST LUMPECTOMY WITH RADIOACTIVE SEED AND SENTINEL LYMPH NODE BIOPSY Right 01/27/2019   Procedure: RIGHT BREAST LUMPECTOMY WITH RADIOACTIVE SEED AND SENTINEL LYMPH NODE MAPPING;  Surgeon: Erroll Luna, MD;  Location: [place];  Service: General;  Laterality: Right;  . COSMETIC SURGERY Bilateral 2000   breast aug/ lipo  . PORTACATH PLACEMENT Right 01/27/2019   Procedure: INSERTION PORT-A-CATH WITH ULTRASOUND;  Surgeon: Erroll Luna,  MD;  Location: [place];  Service: General;  Laterality: Right;    I have reviewed the social history and family history with the patient and they are unchanged from previous note.  ALLERGIES:  has No Known Allergies.  MEDICATIONS:  Current Outpatient Medications  Medication Sig Dispense Refill  . ALPRAZolam (XANAX) 0.25 MG tablet Take 0.25 mg by mouth 3 (three) times daily as needed.    Marland Kitchen atorvastatin (LIPITOR) 40 MG tablet 1 po qhs. 90 tablet 0  . CALCIUM PO Take 1,000 mg by mouth daily.    Marland Kitchen lidocaine-prilocaine (EMLA) cream Apply to affected area once 30 g 3  . Magnesium 125 MG CAPS Take 1 capsule by mouth daily.     . Melatonin 3 MG CAPS Take 3 mg by mouth at bedtime.     . Multiple Vitamins-Minerals (MULTIVITAMIN WITH MINERALS) tablet Take 1 tablet by mouth daily.    . ondansetron (ZOFRAN ODT) 4 MG disintegrating tablet Take 1 tablet (4 mg total) by mouth every 8 (eight) hours as needed for nausea or vomiting. (Patient not taking: Reported on 02/02/2019) 20 tablet 0  . pantoprazole (PROTONIX) 40 MG tablet Take 1 tablet (40 mg total) by mouth daily. 30 tablet 3  . pantoprazole (PROTONIX) 40 MG tablet Take 1 tablet (40 mg total) by mouth 2 (two) times daily. 112 tablet 0  . prochlorperazine (COMPAZINE) 10 MG tablet Take 1 tablet (10 mg total) by mouth every  6 (six) hours as needed (Nausea or vomiting). 30 tablet 1  . traZODone (DESYREL) 50 MG tablet Take 1-2 tablets (50-100 mg total) by mouth at bedtime as needed for sleep. 180 tablet 0  . VITAMIN D PO Take 12,000 Units by mouth daily.     No current facility-administered medications for this visit.   Facility-Administered Medications Ordered in Other Visits  Medication Dose Route Frequency Provider Last Rate Last Admin  . heparin lock flush 100 unit/mL  500 Units Intracatheter Once PRN Truitt Merle, MD      . prochlorperazine (COMPAZINE) tablet 10 mg  10 mg Oral Q6H PRN Truitt Merle, MD   10 mg at 03/02/19 1021  . sodium  chloride flush (NS) 0.9 % injection 10 mL  10 mL Intracatheter PRN Truitt Merle, MD        PHYSICAL EXAMINATION: ECOG PERFORMANCE STATUS: 0 - Asymptomatic  Vitals:   03/02/19 0918  BP: (!) 146/80  Pulse: 81  Resp: 18  Temp: 98.7 F (37.1 C)  SpO2: 99%   Filed Weights   03/02/19 0918  Weight: 175 lb 4.8 oz (79.5 kg)    GENERAL:alert, no distress and comfortable SKIN: skin color, texture, turgor are normal, no rashes or significant lesions EYES: normal, Conjunctiva are pink and non-injected, sclera clear  NECK: supple, thyroid normal size, non-tender, without nodularity LYMPH:  no palpable lymphadenopathy in the cervical, axillary  LUNGS: clear to auscultation and percussion with normal breathing effort HEART: regular rate & rhythm and no murmurs and no lower extremity edema ABDOMEN:abdomen soft, non-tender and normal bowel sounds Musculoskeletal:no cyanosis of digits and no clubbing  NEURO: alert & oriented x 3 with fluent speech, no focal motor/sensory deficits  LABORATORY DATA:  I have reviewed the data as listed CBC Latest Ref Rng & Units 03/02/2019 02/23/2019 02/02/2019  WBC 4.0 - 10.5 K/uL 4.6 4.8 5.9  Hemoglobin 12.0 - 15.0 g/dL 12.9 13.6 14.5  Hematocrit 36.0 - 46.0 % 37.0 40.5 42.9  Platelets 150 - 400 K/uL 190 200 196     CMP Latest Ref Rng & Units 03/02/2019 02/23/2019 02/02/2019  Glucose 70 - 99 mg/dL 139(H) 101(H) 106(H)  BUN 6 - 20 mg/dL '16 13 13  '$ Creatinine 0.44 - 1.00 mg/dL 0.80 0.76 0.72  Sodium 135 - 145 mmol/L 144 141 142  Potassium 3.5 - 5.1 mmol/L 4.0 4.0 4.1  Chloride 98 - 111 mmol/L 109 108 103  CO2 22 - 32 mmol/L '27 27 23  '$ Calcium 8.9 - 10.3 mg/dL 9.1 8.9 9.5  Total Protein 6.5 - 8.1 g/dL 6.6 6.5 6.9  Total Bilirubin 0.3 - 1.2 mg/dL 0.3 0.3 0.3  Alkaline Phos 38 - 126 U/L 84 88 75  AST 15 - 41 U/L 45(H) 36 19  ALT 0 - 44 U/L 115(H) 65(H) 36(H)      RADIOGRAPHIC STUDIES: I have personally reviewed the radiological images as listed and agreed with  the findings in the report. No results found.   ASSESSMENT & PLAN:  Alicia Hahn is a 59 y.o. female with    1.Malignant neoplasm of upper-outer quadrant of right breast,invasive ductal carcinoma,pT1cN0M0,stageIA,Triple Positive,Grade3 -She was recently diagnosed in 12/2018. She has 78m mass of right breast with invasive ductal carcinoma and components of DCIS. -She underwent right lumpectomy and SLNB on 01/27/19 by Dr CBrantley Stage We discussed her pathology which showed 1.3cm tumor removed, clear margins and 3 nodes negative.  -Given the size of her tumor and HER2 positive disease she has higher  risk of recurrence. I recommended adjuvant chemotherapy with Weekly Abraxane and Herceptin q3weeks for 12 weeks starting 02/23/19. Plan to continue maintenance Herceptin to complete 1 year of treatment.  -After chemo she will also proceed with adjuvant Radiation and antiestrogen therapy to further reduce her risk of local and distant recurrence.  -She had PAC placed with surgery.  -She tolerated first week of Herceptin and Abraxane well. She had a few episodes of muscle cramps and manageable diarrhea. She felt only mild fatigue. I encouraged her to stay hydrated with fluid and continue ice bags during infusion to combat neuropathy.  -Labs reviewed and adequate to proceed with C2 Abraxane today.  -Continue to f/u every other week.    2. Gastric ulcers  -After surgery she took Motrin and started having severe epigastric pain and required ED visit.  -Her 02/25/19 Endoscopy with Dr. Tarri Glenn showed 5 small gastric ulcers, benign. She is currently on Protonix and will continues until she completes chemo. Will repeat endoscopy with Dr. Tarri Glenn 3 months later.  -Pain is manageable on Protonix.    3. Estrogen replacement andpostmenopausal vaginalbleeding -Started after 2nd Mirena was taken out. Her Gyn put her on a third Mirena and bleeding stopped.  Vernia Buff estrogen replacement for several  years for hot flashes. By her GYN. -I discussed given her age she is likely postmenopausal and her given ER/PR positive breast cancer I recommend she take her Mirena out. If she has further vaginal bleeding she should undergo workup for this with her Gyn.    4. Genetics  -Based on family history her father has prostate cancer and she had significant family history of breast cancer. She is eligible for genetic testing.  -Pt notes she genetic testing before at [facility]. Will obtain records.  5. Transaminitis -Likely related to chemo, also possible related to Lipitor which started a few months ago -Liver enzymes higher than last week, total bilirubin normal, will proceed Abraxane at same dose today. -I recommend her to hold Lipitor during the chemo -Follow-up lab weekly. If she has worsening transaminitis, I will reduce Abraxane dose or hold it if needed.  PLAN: -Lab reviewed, adequate for treatment, will proceed to second week Abraxane today  -will schedule Lab, flush, chemo Abraxane weekly for week 7-12 -Trastuzumab infusion every 3 weeks  -F/u with me or NP Lacie every other week until she completes chemo.    No problem-specific Assessment & Plan notes found for this encounter.   No orders of the defined types were placed in this encounter.  All questions were answered. The patient knows to call the clinic with any problems, questions or concerns. No barriers to learning was detected. The total time spent in the appointment was 30 minutes.     Truitt Merle, MD 03/02/2019   I, Joslyn Devon, am acting as scribe for Truitt Merle, MD.   I have reviewed the above documentation for accuracy and completeness, and I agree with the above.

## 2019-02-23 NOTE — Patient Instructions (Signed)
[facility] Discharge Instructions for Patients Receiving Chemotherapy  Today you received the following chemotherapy agents: Kanjinti and Abraxane.   To help prevent nausea and vomiting after your treatment, we encourage you to take your nausea medication as directed.   If you develop nausea and vomiting that is not controlled by your nausea medication, call the clinic.   BELOW ARE SYMPTOMS THAT SHOULD BE REPORTED IMMEDIATELY:  *FEVER GREATER THAN 100.5 F  *CHILLS WITH OR WITHOUT FEVER  NAUSEA AND VOMITING THAT IS NOT CONTROLLED WITH YOUR NAUSEA MEDICATION  *UNUSUAL SHORTNESS OF BREATH  *UNUSUAL BRUISING OR BLEEDING  TENDERNESS IN MOUTH AND THROAT WITH OR WITHOUT PRESENCE OF ULCERS  *URINARY PROBLEMS  *BOWEL PROBLEMS  UNUSUAL RASH Items with * indicate a potential emergency and should be followed up as soon as possible.  Feel free to call the clinic should you have any questions or concerns. The clinic phone number is [phone].  Please show the [facility] at check-in to the Emergency Department and triage nurse.   Trastuzumab injection for infusion What is this medicine? TRASTUZUMAB (tras TOO zoo mab) is a monoclonal antibody. It is used to treat breast cancer and stomach cancer. This medicine may be used for other purposes; ask your health care provider or pharmacist if you have questions. COMMON BRAND NAME(S): Herceptin, Galvin Proffer, Trazimera What should I tell my health care provider before I take this medicine? They need to know if you have any of these conditions:  heart disease  heart failure  lung or breathing disease, like asthma  an unusual or allergic reaction to trastuzumab, benzyl alcohol, or other medications, foods, dyes, or preservatives  pregnant or trying to get pregnant  breast-feeding How should I use this medicine? This drug is given as an infusion into a vein. It is administered in a  hospital or clinic by a specially trained health care professional. Talk to your pediatrician regarding the use of this medicine in children. This medicine is not approved for use in children. Overdosage: If you think you have taken too much of this medicine contact a poison control center or emergency room at once. NOTE: This medicine is only for you. Do not share this medicine with others. What if I miss a dose? It is important not to miss a dose. Call your doctor or health care professional if you are unable to keep an appointment. What may interact with this medicine? This medicine may interact with the following medications:  certain types of chemotherapy, such as daunorubicin, doxorubicin, epirubicin, and idarubicin This list may not describe all possible interactions. Give your health care provider a list of all the medicines, herbs, non-prescription drugs, or dietary supplements you use. Also tell them if you smoke, drink alcohol, or use illegal drugs. Some items may interact with your medicine. What should I watch for while using this medicine? Visit your doctor for checks on your progress. Report any side effects. Continue your course of treatment even though you feel ill unless your doctor tells you to stop. Call your doctor or health care professional for advice if you get a fever, chills or sore throat, or other symptoms of a cold or flu. Do not treat yourself. Try to avoid being around people who are sick. You may experience fever, chills and shaking during your first infusion. These effects are usually mild and can be treated with other medicines. Report any side effects during the infusion to your health care  professional. Fever and chills usually do not happen with later infusions. Do not become pregnant while taking this medicine or for 7 months after stopping it. Women should inform their doctor if they wish to become pregnant or think they might be pregnant. Women of child-bearing  potential will need to have a negative pregnancy test before starting this medicine. There is a potential for serious side effects to an unborn child. Talk to your health care professional or pharmacist for more information. Do not breast-feed an infant while taking this medicine or for 7 months after stopping it. Women must use effective birth control with this medicine. What side effects may I notice from receiving this medicine? Side effects that you should report to your doctor or health care professional as soon as possible:  allergic reactions like skin rash, itching or hives, swelling of the face, lips, or tongue  chest pain or palpitations  cough  dizziness  feeling faint or lightheaded, falls  fever  general ill feeling or flu-like symptoms  signs of worsening heart failure like breathing problems; swelling in your legs and feet  unusually weak or tired Side effects that usually do not require medical attention (report to your doctor or health care professional if they continue or are bothersome):  bone pain  changes in taste  diarrhea  joint pain  nausea/vomiting  weight loss This list may not describe all possible side effects. Call your doctor for medical advice about side effects. You may report side effects to FDA at 1-800-FDA-1088. Where should I keep my medicine? This drug is given in a hospital or clinic and will not be stored at home. NOTE: This sheet is a summary. It may not cover all possible information. If you have questions about this medicine, talk to your doctor, pharmacist, or health care provider.  2020 Elsevier/Gold Standard (2016-01-01 14:37:52)  Nanoparticle Albumin-Bound Paclitaxel injection What is this medicine? NANOPARTICLE ALBUMIN-BOUND PACLITAXEL (Na no PAHR ti kuhl al BYOO muhn-bound PAK li TAX el) is a chemotherapy drug. It targets fast dividing cells, like cancer cells, and causes these cells to die. This medicine is used to treat  advanced breast cancer, lung cancer, and pancreatic cancer. This medicine may be used for other purposes; ask your health care provider or pharmacist if you have questions. COMMON BRAND NAME(S): Abraxane What should I tell my health care provider before I take this medicine? They need to know if you have any of these conditions:  kidney disease  liver disease  low blood counts, like low white cell, platelet, or red cell counts  lung or breathing disease, like asthma  tingling of the fingers or toes, or other nerve disorder  an unusual or allergic reaction to paclitaxel, albumin, other chemotherapy, other medicines, foods, dyes, or preservatives  pregnant or trying to get pregnant  breast-feeding How should I use this medicine? This drug is given as an infusion into a vein. It is administered in a hospital or clinic by a specially trained health care professional. Talk to your pediatrician regarding the use of this medicine in children. Special care may be needed. Overdosage: If you think you have taken too much of this medicine contact a poison control center or emergency room at once. NOTE: This medicine is only for you. Do not share this medicine with others. What if I miss a dose? It is important not to miss your dose. Call your doctor or health care professional if you are unable to keep an appointment. What  may interact with this medicine? This medicine may interact with the following medications:  antiviral medicines for hepatitis, HIV or AIDS  certain antibiotics like erythromycin and clarithromycin  certain medicines for fungal infections like ketoconazole and itraconazole  certain medicines for seizures like carbamazepine, phenobarbital, phenytoin  gemfibrozil  nefazodone  rifampin  St. John's wort This list may not describe all possible interactions. Give your health care provider a list of all the medicines, herbs, non-prescription drugs, or dietary  supplements you use. Also tell them if you smoke, drink alcohol, or use illegal drugs. Some items may interact with your medicine. What should I watch for while using this medicine? Your condition will be monitored carefully while you are receiving this medicine. You will need important blood work done while you are taking this medicine. This medicine can cause serious allergic reactions. If you experience allergic reactions like skin rash, itching or hives, swelling of the face, lips, or tongue, tell your doctor or health care professional right away. In some cases, you may be given additional medicines to help with side effects. Follow all directions for their use. This drug may make you feel generally unwell. This is not uncommon, as chemotherapy can affect healthy cells as well as cancer cells. Report any side effects. Continue your course of treatment even though you feel ill unless your doctor tells you to stop. Call your doctor or health care professional for advice if you get a fever, chills or sore throat, or other symptoms of a cold or flu. Do not treat yourself. This drug decreases your body's ability to fight infections. Try to avoid being around people who are sick. This medicine may increase your risk to bruise or bleed. Call your doctor or health care professional if you notice any unusual bleeding. Be careful brushing and flossing your teeth or using a toothpick because you may get an infection or bleed more easily. If you have any dental work done, tell your dentist you are receiving this medicine. Avoid taking products that contain aspirin, acetaminophen, ibuprofen, naproxen, or ketoprofen unless instructed by your doctor. These medicines may hide a fever. Do not become pregnant while taking this medicine or for 6 months after stopping it. Women should inform their doctor if they wish to become pregnant or think they might be pregnant. Men should not father a child while taking this  medicine or for 3 months after stopping it. There is a potential for serious side effects to an unborn child. Talk to your health care professional or pharmacist for more information. Do not breast-feed an infant while taking this medicine or for 2 weeks after stopping it. This medicine may interfere with the ability to get pregnant or to father a child. You should talk to your doctor or health care professional if you are concerned about your fertility. What side effects may I notice from receiving this medicine? Side effects that you should report to your doctor or health care professional as soon as possible:  allergic reactions like skin rash, itching or hives, swelling of the face, lips, or tongue  breathing problems  changes in vision  fast, irregular heartbeat  low blood pressure  mouth sores  pain, tingling, numbness in the hands or feet  signs of decreased platelets or bleeding - bruising, pinpoint red spots on the skin, black, tarry stools, blood in the urine  signs of decreased red blood cells - unusually weak or tired, feeling faint or lightheaded, falls  signs of infection -  fever or chills, cough, sore throat, pain or difficulty passing urine  signs and symptoms of liver injury like dark yellow or brown urine; general ill feeling or flu-like symptoms; light-colored stools; loss of appetite; nausea; right upper belly pain; unusually weak or tired; yellowing of the eyes or skin  swelling of the ankles, feet, hands  unusually slow heartbeat Side effects that usually do not require medical attention (report to your doctor or health care professional if they continue or are bothersome):  diarrhea  hair loss  loss of appetite  nausea, vomiting  tiredness This list may not describe all possible side effects. Call your doctor for medical advice about side effects. You may report side effects to FDA at 1-800-FDA-1088. Where should I keep my medicine? This drug is  given in a hospital or clinic and will not be stored at home. NOTE: This sheet is a summary. It may not cover all possible information. If you have questions about this medicine, talk to your doctor, pharmacist, or health care provider.  2020 Elsevier/Gold Standard (2016-09-09 13:03:45)

## 2019-02-23 NOTE — Patient Instructions (Signed)

## 2019-02-24 ENCOUNTER — Telehealth: Payer: Self-pay | Admitting: Nurse Practitioner

## 2019-02-24 NOTE — Telephone Encounter (Signed)
No los per 2/3.

## 2019-02-25 ENCOUNTER — Encounter: Payer: Self-pay | Admitting: Gastroenterology

## 2019-02-25 ENCOUNTER — Other Ambulatory Visit: Payer: Self-pay

## 2019-02-25 ENCOUNTER — Ambulatory Visit (AMBULATORY_SURGERY_CENTER): Payer: 59 | Admitting: Gastroenterology

## 2019-02-25 VITALS — BP 140/78 | HR 78 | Temp 97.3°F | Resp 12 | Ht 63.0 in | Wt 174.0 lb

## 2019-02-25 DIAGNOSIS — K2950 Unspecified chronic gastritis without bleeding: Secondary | ICD-10-CM

## 2019-02-25 DIAGNOSIS — K259 Gastric ulcer, unspecified as acute or chronic, without hemorrhage or perforation: Secondary | ICD-10-CM

## 2019-02-25 DIAGNOSIS — R933 Abnormal findings on diagnostic imaging of other parts of digestive tract: Secondary | ICD-10-CM

## 2019-02-25 MED ORDER — PANTOPRAZOLE SODIUM 40 MG PO TBEC: 40.0000 mg | DELAYED_RELEASE_TABLET | Freq: Two times a day (BID) | ORAL | 0 refills | Status: DC

## 2019-02-25 MED ORDER — SODIUM CHLORIDE 0.9 % IV SOLN: 500.0000 mL | Freq: Once | INTRAVENOUS | Status: DC

## 2019-02-25 NOTE — Op Note (Signed)
Alicia Hahn Patient Name: Alicia Hahn Procedure Date: 02/25/2019 8:57 AM MRN: [id number] Endoscopist: Thornton Park MD, MD Age: 59 Referring MD:  Date of Birth: Sep 16, 1960 Gender: Female Account #: [id number] Procedure:                Upper GI endoscopy Indications:              Abnormal CT of the GI tract                           Epigastric pain improving on PPI therapy                           Post operative Motrin use twice, no regular NSAIDs                           Abnormal CT scan: thickening of the gastric antrum                           Rare GERD Medicines:                Monitored Anesthesia Care Procedure:                Pre-Anesthesia Assessment:                           - Prior to the procedure, a History and Physical                            was performed, and patient medications and                            allergies were reviewed. The patient's tolerance of                            previous anesthesia was also reviewed. The risks                            and benefits of the procedure and the sedation                            options and risks were discussed with the patient.                            All questions were answered, and informed consent                            was obtained. Prior Anticoagulants: The patient has                            taken no previous anticoagulant or antiplatelet                            agents. ASA Grade Assessment: II - A patient with  mild systemic disease. After reviewing the risks                            and benefits, the patient was deemed in                            satisfactory condition to undergo the procedure.                           After obtaining informed consent, the endoscope was                            passed under direct vision. Throughout the                            procedure, the patient's blood pressure, pulse, and                             oxygen saturations were monitored continuously. The                            Endoscope was introduced through the mouth, and                            advanced to the third part of duodenum. The upper                            GI endoscopy was accomplished without difficulty.                            The patient tolerated the procedure well. Scope In: Scope Out: Findings:                 The esophagus was normal.                           Five non-bleeding superficial gastric ulcers with                            no stigmata of bleeding were found in the gastric                            antrum. The largest lesion was 4 mm in largest                            dimension and nearly confluent. Biopsies were taken                            from ulcer margins with a cold forceps for                            histology. Estimated blood loss was minimal.  The remainder of the examined gastric mucosa                            appeared normal. Biopsies were taken from the                            antrum, body, and fundus with a cold forceps for                            histology. Estimated blood loss was minimal.                           The examined duodenum was normal.                           The cardia and gastric fundus were normal on                            retroflexion.                           The exam was otherwise without abnormality. Complications:            No immediate complications. Estimated blood loss:                            Minimal. Estimated Blood Loss:     Estimated blood loss was minimal. Impression:               - Normal esophagus.                           - Non-bleeding gastric ulcers with no stigmata of                            bleeding. Biopsied.                           - Normal stomach. Biopsied.                           - Normal examined duodenum.                           - The examination was otherwise  normal. Recommendation:           - Patient has a contact number available for                            emergencies. The signs and symptoms of potential                            delayed complications were discussed with the                            patient. Return to normal activities tomorrow.  Written discharge instructions were provided to the                            patient.                           - Resume previous diet today.                           - No aspirin, ibuprofen, naproxen, or other                            non-steroidal anti-inflammatory drugs.                           - Continue present medications. Increase                            pantoprazole to 40 mg BID x 8 weeks, then reduce to                            40 mg daily during the remainder of chemotherapy.                           - Await pathology results.                           - Repeat upper endoscopy in 3 months to check                            healing of the gastric ulcer.                           - Screening colonoscopy recommend after completing                            chemotherapy. Thornton Park MD, MD 02/25/2019 9:14:35 AM This report has been signed electronically.

## 2019-02-25 NOTE — Progress Notes (Signed)
Called to room to assist during endoscopic procedure.  Patient ID and intended procedure confirmed with present staff. Received instructions for my participation in the procedure from the performing physician.  

## 2019-02-25 NOTE — Patient Instructions (Signed)
No aspirin, ibuprofen, naproxen, or other non-steroidal anti-inflammatory drugs (NSAIDS).   Continue present medications. Increase pantoprazole to 40mg  twice daily for 8 weeks, then reduce to 40mg  daily during the remainder of chemotherapy.    YOU HAD AN ENDOSCOPIC PROCEDURE TODAY AT [facility]:   Refer to the procedure report that was given to you for any specific questions about what was found during the examination.  If the procedure report does not answer your questions, please call your gastroenterologist to clarify.  If you requested that your care partner not be given the details of your procedure findings, then the procedure report has been included in a sealed envelope for you to review at your convenience later.  YOU SHOULD EXPECT: Some feelings of bloating in the abdomen. Passage of more gas than usual.  Walking can help get rid of the air that was put into your GI tract during the procedure and reduce the bloating. If you had a lower endoscopy (such as a colonoscopy or flexible sigmoidoscopy) you may notice spotting of blood in your stool or on the toilet paper. If you underwent a bowel prep for your procedure, you may not have a normal bowel movement for a few days.  Please Note:  You might notice some irritation and congestion in your nose or some drainage.  This is from the oxygen used during your procedure.  There is no need for concern and it should clear up in a day or so.  SYMPTOMS TO REPORT IMMEDIATELY:    Following upper endoscopy (EGD)  Vomiting of blood or coffee ground material  New chest pain or pain under the shoulder blades  Painful or persistently difficult swallowing  New shortness of breath  Fever of 100F or higher  Black, tarry-looking stools  For urgent or emergent issues, a gastroenterologist can be reached at any hour by calling [phone].   DIET:  We do recommend a small meal at first, but then you may proceed to your regular diet.   Drink plenty of fluids but you should avoid alcoholic beverages for 24 hours.  ACTIVITY:  You should plan to take it easy for the rest of today and you should NOT DRIVE or use heavy machinery until tomorrow (because of the sedation medicines used during the test).    FOLLOW UP: Our staff will call the number listed on your records 48-72 hours following your procedure to check on you and address any questions or concerns that you may have regarding the information given to you following your procedure. If we do not reach you, we will leave a message.  We will attempt to reach you two times.  During this call, we will ask if you have developed any symptoms of COVID 19. If you develop any symptoms (ie: fever, flu-like symptoms, shortness of breath, cough etc.) before then, please call [phone].  If you test positive for Covid 19 in the 2 weeks post procedure, please call and report this information to [place].    If any biopsies were taken you will be contacted by phone or by letter within the next 1-3 weeks.  Please call us at [phone] if you have not heard about the biopsies in 3 weeks.    SIGNATURES/CONFIDENTIALITY: You and/or your care partner have signed paperwork which will be entered into your electronic medical record.  These signatures attest to the fact that that the information above on your After Visit Summary has been reviewed and is understood.  Full responsibility of the confidentiality of this discharge information lies with you and/or your care-partner.

## 2019-02-25 NOTE — Progress Notes (Signed)
Report to PACU, RN, vss, BBS= Clear.  

## 2019-02-25 NOTE — Progress Notes (Signed)
Vitals by DT Temp by JB 

## 2019-02-28 ENCOUNTER — Ambulatory Visit: Payer: 59 | Admitting: Gastroenterology

## 2019-03-01 ENCOUNTER — Telehealth: Payer: Self-pay

## 2019-03-01 NOTE — Telephone Encounter (Signed)
  Follow up Call-  Call back number 02/25/2019  Permission to leave phone message Yes  Some recent data might be hidden     Patient questions:  Do you have a fever, pain , or abdominal swelling? No. Pain Score  0 *  Have you tolerated food without any problems? Yes.    Have you been able to return to your normal activities? Yes.    Do you have any questions about your discharge instructions: Diet   No. Medications  No. Follow up visit  No.  Do you have questions or concerns about your Care? No.  Actions: * If pain score is 4 or above: No action needed, pain <4.  Have you developed a fever since your procedure? No 2.   Have you had an respiratory symptoms (SOB or cough) since your procedure? No  3.   Have you tested positive for COVID 19 since your procedure No  4.   Have you had any family members/close contacts diagnosed with the COVID 19 since your procedure?  No   If yes to any of these questions please route to Joylene John, RN and Alphonsa Gin, RN.

## 2019-03-02 ENCOUNTER — Encounter: Payer: Self-pay | Admitting: Hematology

## 2019-03-02 ENCOUNTER — Inpatient Hospital Stay: Payer: 59

## 2019-03-02 ENCOUNTER — Encounter: Payer: Self-pay | Admitting: Hematology and Oncology

## 2019-03-02 ENCOUNTER — Encounter: Payer: Self-pay | Admitting: *Deleted

## 2019-03-02 ENCOUNTER — Other Ambulatory Visit: Payer: Self-pay

## 2019-03-02 ENCOUNTER — Inpatient Hospital Stay (HOSPITAL_BASED_OUTPATIENT_CLINIC_OR_DEPARTMENT_OTHER): Payer: 59 | Admitting: Hematology

## 2019-03-02 VITALS — BP 146/80 | HR 81 | Temp 98.7°F | Resp 18 | Ht 63.0 in | Wt 175.3 lb

## 2019-03-02 DIAGNOSIS — C50411 Malignant neoplasm of upper-outer quadrant of right female breast: Secondary | ICD-10-CM

## 2019-03-02 DIAGNOSIS — Z170 Estrogen receptor positive status [ER+]: Secondary | ICD-10-CM | POA: Diagnosis not present

## 2019-03-02 DIAGNOSIS — M5412 Radiculopathy, cervical region: Secondary | ICD-10-CM

## 2019-03-02 DIAGNOSIS — Z5111 Encounter for antineoplastic chemotherapy: Secondary | ICD-10-CM | POA: Diagnosis not present

## 2019-03-02 LAB — CMP (CANCER CENTER ONLY)
ALT: 115 U/L — ABNORMAL HIGH (ref 0–44)
AST: 45 U/L — ABNORMAL HIGH (ref 15–41)
Albumin: 3.8 g/dL (ref 3.5–5.0)
Alkaline Phosphatase: 84 U/L (ref 38–126)
Anion gap: 8 (ref 5–15)
BUN: 16 mg/dL (ref 6–20)
CO2: 27 mmol/L (ref 22–32)
Calcium: 9.1 mg/dL (ref 8.9–10.3)
Chloride: 109 mmol/L (ref 98–111)
Creatinine: 0.8 mg/dL (ref 0.44–1.00)
GFR, Est AFR Am: >60 mL/min (ref >60)
GFR, Estimated: >60 mL/min (ref >60)
Glucose, Bld: 139 mg/dL — ABNORMAL HIGH (ref 70–99)
Potassium: 4 mmol/L (ref 3.5–5.1)
Sodium: 144 mmol/L (ref 135–145)
Total Bilirubin: 0.3 mg/dL (ref 0.3–1.2)
Total Protein: 6.6 g/dL (ref 6.5–8.1)

## 2019-03-02 LAB — CBC WITH DIFFERENTIAL (CANCER CENTER ONLY)
Abs Immature Granulocytes: 0.05 10*3/uL (ref 0.00–0.07)
Basophils Absolute: 0.1 10*3/uL (ref 0.0–0.1)
Basophils Relative: 1 %
Eosinophils Absolute: 0.1 10*3/uL (ref 0.0–0.5)
Eosinophils Relative: 3 %
HCT: 37 % (ref 36.0–46.0)
Hemoglobin: 12.9 g/dL (ref 12.0–15.0)
Immature Granulocytes: 1 %
Lymphocytes Relative: 29 %
Lymphs Abs: 1.3 10*3/uL (ref 0.7–4.0)
MCH: 30.8 pg (ref 26.0–34.0)
MCHC: 34.9 g/dL (ref 30.0–36.0)
MCV: 88.3 fL (ref 80.0–100.0)
Monocytes Absolute: 0.3 10*3/uL (ref 0.1–1.0)
Monocytes Relative: 5 %
Neutro Abs: 2.8 10*3/uL (ref 1.7–7.7)
Neutrophils Relative %: 61 %
Platelet Count: 190 10*3/uL (ref 150–400)
RBC: 4.19 MIL/uL (ref 3.87–5.11)
RDW: 11.2 % — ABNORMAL LOW (ref 11.5–15.5)
WBC Count: 4.6 10*3/uL (ref 4.0–10.5)
nRBC: 0 % (ref 0.0–0.2)

## 2019-03-02 MED ORDER — HEPARIN SOD (PORK) LOCK FLUSH 100 UNIT/ML IV SOLN
500.0000 [IU] | Freq: Once | INTRAVENOUS | Status: AC | PRN
  Administered 2019-03-02: 500 [IU]
  Filled 2019-03-02: qty 5

## 2019-03-02 MED ORDER — SODIUM CHLORIDE 0.9 % IV SOLN
Freq: Once | INTRAVENOUS | Status: AC
  Filled 2019-03-02: qty 250

## 2019-03-02 MED ORDER — PROCHLORPERAZINE MALEATE 10 MG PO TABS
ORAL_TABLET | ORAL | Status: AC
  Filled 2019-03-02: qty 1

## 2019-03-02 MED ORDER — PACLITAXEL PROTEIN-BOUND CHEMO INJECTION 100 MG
100.0000 mg/m2 | Freq: Once | INTRAVENOUS | Status: AC
  Administered 2019-03-02: 175 mg via INTRAVENOUS
  Filled 2019-03-02: qty 35

## 2019-03-02 MED ORDER — PROCHLORPERAZINE MALEATE 10 MG PO TABS
10.0000 mg | ORAL_TABLET | Freq: Four times a day (QID) | ORAL | Status: DC | PRN
  Administered 2019-03-02: 10:00:00 10 mg via ORAL

## 2019-03-02 MED ORDER — SODIUM CHLORIDE 0.9% FLUSH
10.0000 mL | INTRAVENOUS | Status: DC | PRN
  Administered 2019-03-02: 10 mL
  Filled 2019-03-02: qty 10

## 2019-03-02 NOTE — Progress Notes (Signed)
Met with patient outside of waiting area to introduce myself as Arboriculturist and to offer available resources.  Discussed one-time $1000 Radio broadcast assistant to assist with personal expenses while going through treatment.  Gave her my card if interested in applying and for any additional financial questions or concerns.

## 2019-03-02 NOTE — Progress Notes (Signed)
Ok to treat, no interventions today regarding ALT per MD Burr Medico. Patient aware

## 2019-03-03 ENCOUNTER — Telehealth: Payer: Self-pay | Admitting: Hematology

## 2019-03-03 NOTE — Telephone Encounter (Signed)
Scheduled appt per 2/10 los.

## 2019-03-09 ENCOUNTER — Inpatient Hospital Stay: Payer: 59

## 2019-03-09 ENCOUNTER — Other Ambulatory Visit: Payer: Self-pay

## 2019-03-09 VITALS — BP 139/84 | HR 88 | Temp 97.4°F | Resp 16 | Wt 176.4 lb

## 2019-03-09 DIAGNOSIS — Z5111 Encounter for antineoplastic chemotherapy: Secondary | ICD-10-CM | POA: Diagnosis not present

## 2019-03-09 DIAGNOSIS — C50411 Malignant neoplasm of upper-outer quadrant of right female breast: Secondary | ICD-10-CM

## 2019-03-09 DIAGNOSIS — Z170 Estrogen receptor positive status [ER+]: Secondary | ICD-10-CM

## 2019-03-09 DIAGNOSIS — Z95828 Presence of other vascular implants and grafts: Secondary | ICD-10-CM

## 2019-03-09 LAB — CMP (CANCER CENTER ONLY)
ALT: 70 U/L — ABNORMAL HIGH (ref 0–44)
AST: 25 U/L (ref 15–41)
Albumin: 3.8 g/dL (ref 3.5–5.0)
Alkaline Phosphatase: 81 U/L (ref 38–126)
Anion gap: 8 (ref 5–15)
BUN: 19 mg/dL (ref 6–20)
CO2: 26 mmol/L (ref 22–32)
Calcium: 8.9 mg/dL (ref 8.9–10.3)
Chloride: 109 mmol/L (ref 98–111)
Creatinine: 0.86 mg/dL (ref 0.44–1.00)
GFR, Est AFR Am: >60 mL/min (ref >60)
GFR, Estimated: >60 mL/min (ref >60)
Glucose, Bld: 156 mg/dL — ABNORMAL HIGH (ref 70–99)
Potassium: 3.9 mmol/L (ref 3.5–5.1)
Sodium: 143 mmol/L (ref 135–145)
Total Bilirubin: 0.2 mg/dL — ABNORMAL LOW (ref 0.3–1.2)
Total Protein: 6.4 g/dL — ABNORMAL LOW (ref 6.5–8.1)

## 2019-03-09 LAB — CBC WITH DIFFERENTIAL (CANCER CENTER ONLY)
Abs Immature Granulocytes: 0.02 10*3/uL (ref 0.00–0.07)
Basophils Absolute: 0 10*3/uL (ref 0.0–0.1)
Basophils Relative: 1 %
Eosinophils Absolute: 0.1 10*3/uL (ref 0.0–0.5)
Eosinophils Relative: 2 %
HCT: 36.8 % (ref 36.0–46.0)
Hemoglobin: 12.6 g/dL (ref 12.0–15.0)
Immature Granulocytes: 1 %
Lymphocytes Relative: 37 %
Lymphs Abs: 1.2 10*3/uL (ref 0.7–4.0)
MCH: 30.7 pg (ref 26.0–34.0)
MCHC: 34.2 g/dL (ref 30.0–36.0)
MCV: 89.8 fL (ref 80.0–100.0)
Monocytes Absolute: 0.3 10*3/uL (ref 0.1–1.0)
Monocytes Relative: 9 %
Neutro Abs: 1.6 10*3/uL — ABNORMAL LOW (ref 1.7–7.7)
Neutrophils Relative %: 50 %
Platelet Count: 207 10*3/uL (ref 150–400)
RBC: 4.1 MIL/uL (ref 3.87–5.11)
RDW: 11.5 % (ref 11.5–15.5)
WBC Count: 3.2 10*3/uL — ABNORMAL LOW (ref 4.0–10.5)
nRBC: 0 % (ref 0.0–0.2)

## 2019-03-09 MED ORDER — PROCHLORPERAZINE MALEATE 10 MG PO TABS
10.0000 mg | ORAL_TABLET | Freq: Four times a day (QID) | ORAL | Status: DC | PRN
  Administered 2019-03-09: 10 mg via ORAL

## 2019-03-09 MED ORDER — HEPARIN SOD (PORK) LOCK FLUSH 100 UNIT/ML IV SOLN
500.0000 [IU] | Freq: Once | INTRAVENOUS | Status: AC | PRN
  Administered 2019-03-09: 500 [IU]
  Filled 2019-03-09: qty 5

## 2019-03-09 MED ORDER — SODIUM CHLORIDE 0.9 % IV SOLN
Freq: Once | INTRAVENOUS | Status: AC
  Filled 2019-03-09: qty 250

## 2019-03-09 MED ORDER — SODIUM CHLORIDE 0.9% FLUSH
10.0000 mL | INTRAVENOUS | Status: DC | PRN
  Administered 2019-03-09: 10:00:00 10 mL via INTRAVENOUS
  Filled 2019-03-09: qty 10

## 2019-03-09 MED ORDER — PACLITAXEL PROTEIN-BOUND CHEMO INJECTION 100 MG
100.0000 mg/m2 | Freq: Once | INTRAVENOUS | Status: AC
  Administered 2019-03-09: 175 mg via INTRAVENOUS
  Filled 2019-03-09: qty 35

## 2019-03-09 MED ORDER — PROCHLORPERAZINE MALEATE 10 MG PO TABS
ORAL_TABLET | ORAL | Status: AC
  Filled 2019-03-09: qty 1

## 2019-03-09 MED ORDER — SODIUM CHLORIDE 0.9% FLUSH
10.0000 mL | INTRAVENOUS | Status: DC | PRN
  Administered 2019-03-09: 10 mL
  Filled 2019-03-09: qty 10

## 2019-03-09 NOTE — Patient Instructions (Signed)
[facility] Discharge Instructions for Patients Receiving Chemotherapy  Today you received the following chemotherapy agents:  Abraxane.  To help prevent nausea and vomiting after your treatment, we encourage you to take your nausea medication as directed.   If you develop nausea and vomiting that is not controlled by your nausea medication, call the clinic.   BELOW ARE SYMPTOMS THAT SHOULD BE REPORTED IMMEDIATELY:  *FEVER GREATER THAN 100.5 F  *CHILLS WITH OR WITHOUT FEVER  NAUSEA AND VOMITING THAT IS NOT CONTROLLED WITH YOUR NAUSEA MEDICATION  *UNUSUAL SHORTNESS OF BREATH  *UNUSUAL BRUISING OR BLEEDING  TENDERNESS IN MOUTH AND THROAT WITH OR WITHOUT PRESENCE OF ULCERS  *URINARY PROBLEMS  *BOWEL PROBLEMS  UNUSUAL RASH Items with * indicate a potential emergency and should be followed up as soon as possible.  Feel free to call the clinic should you have any questions or concerns. The clinic phone number is [phone].  Please show the CHEMO ALERT CARD at check-in to the Emergency Department and triage nurse.   

## 2019-03-09 NOTE — Patient Instructions (Signed)

## 2019-03-11 NOTE — Progress Notes (Signed)
[place]   [phone]) [phone] Fax:([phone]   Clinic Follow up Note   Patient Care Team: Mellody Dance, DO as PCP - General (Family Medicine) Mcarthur Rossetti, MD as Consulting Physician (Orthopedic Surgery) Molli Posey, MD as Consulting Physician (Obstetrics and Gynecology) Erroll Luna, MD as Consulting Physician (General Surgery) Truitt Merle, MD as Consulting Physician (Hematology) Kyung Rudd, MD as Consulting Physician ([facility]) Mauro Kaufmann, RN as Oncology Nurse Navigator Rockwell Germany, RN as Oncology Nurse Navigator  Date of Service:  03/16/2019  CHIEF COMPLAINT: Follow-up right breast cancer  SUMMARY OF ONCOLOGIC HISTORY: Oncology History Overview Note  Cancer Staging Malignant neoplasm of upper-outer quadrant of right breast in female, estrogen receptor positive ([place]) Staging form: Breast, AJCC 8th Edition - Clinical stage from 12/29/2018: Stage IA (cT1b, cN0, cM0, G2, ER+, PR+, HER2+) - Unsigned - Pathologic stage from 01/27/2019: Stage IA (pT1c, pN0, cM0, G3, ER+, PR+, HER2+) - Signed by Truitt Merle, MD on 02/06/2019 Cancer Staging No matching staging information was found for the patient.    Malignant neoplasm of upper-outer quadrant of right breast in female, estrogen receptor positive ([place])  12/22/2018 Mammogram    Diagnostic Mammogram 12/22/18  IMPRESSION: 1. 9 mm mass, with associated calcifications and architectural distortion, in the 11 o'clock position of the right breast, highly suspicious for breast carcinoma. 2. Questionable mass noted in the inferior aspect of the right breast remains equivocal on diagnostic imaging may reflect focal fibroglandular tissue. There is no sonographic correlate.   12/23/2018 Initial Biopsy   Diagnosis 12/23/18 Breast, right, needle core biopsy, 11 o'clock position - INVASIVE DUCTAL CARCINOMA, SEE COMMENT. - DUCTAL CARCINOMA IN SITU WITH NECROSIS.   12/28/2018 Initial  Diagnosis   Malignant neoplasm of upper-outer quadrant of right breast in female, estrogen receptor positive ([place])   01/13/2019 Imaging   MRI Breast 01/13/19  IMPRESSION: 1. Biopsy proven malignancy in the upper-outer right breast. No additional suspicious findings on the right. 2. No MRI evidence of malignancy on the left. 3. No suspicious lymphadenopathy.   01/27/2019 Surgery   RIGHT BREAST LUMPECTOMY WITH RADIOACTIVE SEED AND SENTINEL LYMPH NODE MAPPING and PAC placement by Dr Brantley Stage  01/27/19   01/27/2019 Pathology Results   FINAL MICROSCOPIC DIAGNOSIS: 01/27/19  A. BREAST, RIGHT, LUMPECTOMY:  - Invasive ductal carcinoma, grade 3, spanning 1.3 cm.  - High grade ductal carcinoma in situ with necrosis.  - Biopsy site.  - Final resection margins are negative for carcinoma.  - See oncology table.   B. BREAST, RIGHT ADDITIONAL SUPERIOR MARGIN, EXCISION:  - Fibrocystic change and adenosis.  - No malignancy identified.   C. LYMPH NODE, RIGHT #1, SENTINEL, BIOPSY:  - One of one lymph nodes negative for carcinoma (0/1).   D. LYMPH NODE, RIGHT, SENTINEL, BIOPSY:  - One of one lymph nodes negative for carcinoma (0/1).   E. LYMPH NODE, RIGHT, SENTINEL, BIOPSY:  - One of one lymph nodes negative for carcinoma (0/1).   F. LYMPH NODE, RIGHT, SENTINEL, BIOPSY:  - One of one lymph nodes negative for carcinoma (0/1).       01/27/2019 Cancer Staging   Staging form: Breast, AJCC 8th Edition - Pathologic stage from 01/27/2019: Stage IA (pT1c, pN0, cM0, G3, ER+, PR+, HER2+) - Signed by Truitt Merle, MD on 02/06/2019   02/08/2019 Imaging   CT AP W Contrast 02/08/19  IMPRESSION: 1. Wall thickening of the gastric antrum may represent gastritis.   Aortic Atherosclerosis (ICD10-I70.0).   02/23/2019 -  Chemotherapy   Adjuvant weekly Abraxane with Herceptin q3weeks starting 02/23/19      CURRENT THERAPY:  Adjuvant weekly Abraxane with Herceptin q3weeks starting2/3/21  INTERVAL HISTORY:  Alicia Hahn is here for a follow up and treatment. She presents to the clinic alone. She notes with her latest 2 treatment she tolerated well. She had no nausea and only mild fatigue. She notes her musculoskeletal pain mainly in her ribs, hips and thighs that comes and goes that lasts a few seconds. This is manageable so far. She notes she had hair loss so she cut it all off. She denies skin rash. She notes after stopping Lipitor her LFTs improved. She notes her stomach pain from ulcer has much improved on doubled dose Protonix. She plans to have repeated endoscopy and wants to do colonoscopy at same time.    REVIEW OF SYSTEMS:   Constitutional: Denies fevers, chills or abnormal weight loss (+) mild fatigue  Eyes: Denies blurriness of vision Ears, nose, mouth, throat, and face: Denies mucositis or sore throat Respiratory: Denies cough, dyspnea or wheezes Cardiovascular: Denies palpitation, chest discomfort or lower extremity swelling Gastrointestinal:  Denies nausea, heartburn or change in bowel habits Skin: Denies abnormal skin rashes (+) hair loss  MSK: (+) deep MSK pain of ribs, hips, thighs.  Lymphatics: Denies new lymphadenopathy or easy bruising Neurological:Denies numbness, tingling or new weaknesses Behavioral/Psych: Mood is stable, no new changes  All other systems were reviewed with the patient and are negative.  MEDICAL HISTORY:  Past Medical History:  Diagnosis Date  . Cancer [facility])    breast cancer  . Hyperlipidemia     SURGICAL HISTORY: Past Surgical History:  Procedure Laterality Date  . BREAST LUMPECTOMY WITH RADIOACTIVE SEED AND SENTINEL LYMPH NODE BIOPSY Right 01/27/2019   Procedure: RIGHT BREAST LUMPECTOMY WITH RADIOACTIVE SEED AND SENTINEL LYMPH NODE MAPPING;  Surgeon: Erroll Luna, MD;  Location: [facility];  Service: General;  Laterality: Right;  . COSMETIC SURGERY Bilateral 2000   breast aug/ lipo  . PORTACATH PLACEMENT Right 01/27/2019    Procedure: INSERTION PORT-A-CATH WITH ULTRASOUND;  Surgeon: Erroll Luna, MD;  Location: [place];  Service: General;  Laterality: Right;    I have reviewed the social history and family history with the patient and they are unchanged from previous note.  ALLERGIES:  has No Known Allergies.  MEDICATIONS:  Current Outpatient Medications  Medication Sig Dispense Refill  . CALCIUM PO Take 1,000 mg by mouth daily.    Marland Kitchen lidocaine-prilocaine (EMLA) cream Apply to affected area once 30 g 3  . Magnesium 125 MG CAPS Take 1 capsule by mouth daily.     . Melatonin 3 MG CAPS Take 3 mg by mouth at bedtime.     . Multiple Vitamins-Minerals (MULTIVITAMIN WITH MINERALS) tablet Take 1 tablet by mouth daily.    . pantoprazole (PROTONIX) 40 MG tablet Take 1 tablet (40 mg total) by mouth 2 (two) times daily. 112 tablet 0  . traZODone (DESYREL) 50 MG tablet Take 1-2 tablets (50-100 mg total) by mouth at bedtime as needed for sleep. 180 tablet 0  . VITAMIN D PO Take 12,000 Units by mouth daily.    Marland Kitchen ALPRAZolam (XANAX) 0.25 MG tablet Take 0.25 mg by mouth 3 (three) times daily as needed.    Marland Kitchen atorvastatin (LIPITOR) 40 MG tablet 1 po qhs. (Patient not taking: Reported on 03/09/2019) 90 tablet 0  . ondansetron (ZOFRAN ODT) 4 MG disintegrating tablet Take 1 tablet (  4 mg total) by mouth every 8 (eight) hours as needed for nausea or vomiting. (Patient not taking: Reported on 02/02/2019) 20 tablet 0  . pantoprazole (PROTONIX) 40 MG tablet Take 1 tablet (40 mg total) by mouth daily. (Patient not taking: Reported on 03/16/2019) 30 tablet 3  . prochlorperazine (COMPAZINE) 10 MG tablet Take 1 tablet (10 mg total) by mouth every 6 (six) hours as needed (Nausea or vomiting). (Patient not taking: Reported on 03/16/2019) 30 tablet 1   No current facility-administered medications for this visit.    PHYSICAL EXAMINATION: ECOG PERFORMANCE STATUS: 1 - Symptomatic but completely ambulatory  Vitals:   03/16/19  0954  BP: (!) 141/79  Pulse: 80  Resp: 18  Temp: 98.3 F (36.8 C)  SpO2: 100%   Filed Weights   03/16/19 0954  Weight: 177 lb 8 oz (80.5 kg)    GENERAL:alert, no distress and comfortable SKIN: skin color, texture, turgor are normal, no rashes or significant lesions EYES: normal, Conjunctiva are pink and non-injected, sclera clear  NECK: supple, thyroid normal size, non-tender, without nodularity LYMPH:  no palpable lymphadenopathy in the cervical, axillary  LUNGS: clear to auscultation and percussion with normal breathing effort HEART: regular rate & rhythm and no murmurs and no lower extremity edema ABDOMEN:abdomen soft, non-tender and normal bowel sounds Musculoskeletal:no cyanosis of digits and no clubbing  NEURO: alert & oriented x 3 with fluent speech, no focal motor/sensory deficits BREAST: S/p right lumpectomy: Surgical incision healed well with mild scar tissue. No palpable mass, nodules or adenopathy bilaterally. Breast exam benign.   LABORATORY DATA:  I have reviewed the data as listed CBC Latest Ref Rng & Units 03/16/2019 03/09/2019 03/02/2019  WBC 4.0 - 10.5 K/uL 3.6(L) 3.2(L) 4.6  Hemoglobin 12.0 - 15.0 g/dL 12.5 12.6 12.9  Hematocrit 36.0 - 46.0 % 36.4 36.8 37.0  Platelets 150 - 400 K/uL 219 207 190     CMP Latest Ref Rng & Units 03/09/2019 03/02/2019 02/23/2019  Glucose 70 - 99 mg/dL 156(H) 139(H) 101(H)  BUN 6 - 20 mg/dL '19 16 13  '$ Creatinine 0.44 - 1.00 mg/dL 0.86 0.80 0.76  Sodium 135 - 145 mmol/L 143 144 141  Potassium 3.5 - 5.1 mmol/L 3.9 4.0 4.0  Chloride 98 - 111 mmol/L 109 109 108  CO2 22 - 32 mmol/L '26 27 27  '$ Calcium 8.9 - 10.3 mg/dL 8.9 9.1 8.9  Total Protein 6.5 - 8.1 g/dL 6.4(L) 6.6 6.5  Total Bilirubin 0.3 - 1.2 mg/dL 0.2(L) 0.3 0.3  Alkaline Phos 38 - 126 U/L 81 84 88  AST 15 - 41 U/L 25 45(H) 36  ALT 0 - 44 U/L 70(H) 115(H) 65(H)      RADIOGRAPHIC STUDIES: I have personally reviewed the radiological images as listed and agreed with the  findings in the report. No results found.   ASSESSMENT & PLAN:  Alicia Hahn is a 59 y.o. female with    1.Malignant neoplasm of upper-outer quadrant of right breast,invasive ductal carcinoma,pT1cN0M0,stageIA,Triple Positive,Grade3 -She was recently diagnosed in 12/2018.She has 8m mass of right breast with invasive ductal carcinoma and components of DCIS. -She underwent right lumpectomy and SLNB on 01/27/19 by Dr CBrantley Stage We discussed her pathology which showed1.3cm tumor removed, clear margins and3nodesnegative.  -Given the size of her tumor and HER2 positive disease she has higher risk of recurrence. I started her on adjuvant chemotherapy with Weekly Abraxane and Herceptin q3weeks for 12 weeks beginning 02/23/19. Plan to continue maintenance Herceptin to complete 1 year  of treatment.  -After chemo she will also proceed with adjuvant Radiation and antiestrogen therapy to further reduce her risk of local and distant recurrence.  -She continues to tolerate Herceptin and Abraxane well with mild fatigue and intermittent  deep MSK pain of ribs, hips, thighs, so far manageable.   -Labs reviewed and adequate to proceed with Herceptin and week 4 Abraxane today.  -Continue to f/u every other week.  -I encouraged her to proceed with COVID19 vaccine when it becomes available to her.    2. Gastric ulcers  -After surgery she took Motrin and started having severe epigastric pain and required ED visit.  -Her 02/25/19 Endoscopy with Dr. Tarri Glenn showed 5 small gastric ulcers, benign. She is currently on Protonix and will continues until she completes chemo. Will repeat endoscopy with Dr. Tarri Glenn 3 months later. She can request colonoscopy to be done same day.  -Pain is manageable on Protonix BID.   3. Estrogen replacement andpostmenopausal vaginalbleeding -Started after 2nd Mirena was taken out. Her Gyn put her on a third Mirena and bleeding stopped.  Vernia Buff estrogen replacement for  several years for hot flashes. By her GYN. -Given her age she is likely postmenopausal and her given ER/PR positive breast cancer I previously  recommended she take her Mirena out. If she has further vaginal bleeding she should undergo workup for this with her Gyn.    4. Genetics  -Based on family history her father has prostate cancer and she had significant family history of breast cancer. She is eligible for genetic testing.  -Pt notes she genetic testing before at [facility]. Will obtain records.   5. Transaminitis -Likely related to chemo, also possible related to Lipitor which started a few months ago -Her LFTs did improve after stopping lipitor.   -Follow-up lab weekly. If she has worsening transaminitis, I will reduce Abraxane dose or hold it if needed.   PLAN: -Lab reviewed, CBC adequate for treatment, CMP is still pending, if adequate, will proceed with Herceptin and week 4 Abraxane today  -Continue weekly Abraxane and Herceptin every 3 weeks  -F/u with me or NP Lacie every other week until she completes chemo.    No problem-specific Assessment & Plan notes found for this encounter.   No orders of the defined types were placed in this encounter.  All questions were answered. The patient knows to call the clinic with any problems, questions or concerns. No barriers to learning was detected. The total time spent in the appointment was 30 minutes.     Truitt Merle, MD 03/16/2019   I, Joslyn Devon, am acting as scribe for Truitt Merle, MD.   I have reviewed the above documentation for accuracy and completeness, and I agree with the above.

## 2019-03-16 ENCOUNTER — Inpatient Hospital Stay: Payer: 59

## 2019-03-16 ENCOUNTER — Inpatient Hospital Stay (HOSPITAL_BASED_OUTPATIENT_CLINIC_OR_DEPARTMENT_OTHER): Payer: 59 | Admitting: Hematology

## 2019-03-16 ENCOUNTER — Encounter: Payer: Self-pay | Admitting: Hematology

## 2019-03-16 ENCOUNTER — Other Ambulatory Visit: Payer: Self-pay

## 2019-03-16 VITALS — BP 141/79 | HR 80 | Temp 98.3°F | Resp 18 | Ht 63.0 in | Wt 177.5 lb

## 2019-03-16 DIAGNOSIS — C50411 Malignant neoplasm of upper-outer quadrant of right female breast: Secondary | ICD-10-CM

## 2019-03-16 DIAGNOSIS — Z95828 Presence of other vascular implants and grafts: Secondary | ICD-10-CM

## 2019-03-16 DIAGNOSIS — Z170 Estrogen receptor positive status [ER+]: Secondary | ICD-10-CM

## 2019-03-16 DIAGNOSIS — Z5111 Encounter for antineoplastic chemotherapy: Secondary | ICD-10-CM | POA: Diagnosis not present

## 2019-03-16 LAB — CBC WITH DIFFERENTIAL (CANCER CENTER ONLY)
Abs Immature Granulocytes: 0.03 10*3/uL (ref 0.00–0.07)
Basophils Absolute: 0 10*3/uL (ref 0.0–0.1)
Basophils Relative: 1 %
Eosinophils Absolute: 0.1 10*3/uL (ref 0.0–0.5)
Eosinophils Relative: 2 %
HCT: 36.4 % (ref 36.0–46.0)
Hemoglobin: 12.5 g/dL (ref 12.0–15.0)
Immature Granulocytes: 1 %
Lymphocytes Relative: 38 %
Lymphs Abs: 1.4 10*3/uL (ref 0.7–4.0)
MCH: 30.9 pg (ref 26.0–34.0)
MCHC: 34.3 g/dL (ref 30.0–36.0)
MCV: 90.1 fL (ref 80.0–100.0)
Monocytes Absolute: 0.3 10*3/uL (ref 0.1–1.0)
Monocytes Relative: 9 %
Neutro Abs: 1.7 10*3/uL (ref 1.7–7.7)
Neutrophils Relative %: 49 %
Platelet Count: 219 10*3/uL (ref 150–400)
RBC: 4.04 MIL/uL (ref 3.87–5.11)
RDW: 11.8 % (ref 11.5–15.5)
WBC Count: 3.6 10*3/uL — ABNORMAL LOW (ref 4.0–10.5)
nRBC: 0 % (ref 0.0–0.2)

## 2019-03-16 LAB — CMP (CANCER CENTER ONLY)
ALT: 59 U/L — ABNORMAL HIGH (ref 0–44)
AST: 24 U/L (ref 15–41)
Albumin: 3.7 g/dL (ref 3.5–5.0)
Alkaline Phosphatase: 88 U/L (ref 38–126)
Anion gap: 8 (ref 5–15)
BUN: 20 mg/dL (ref 6–20)
CO2: 26 mmol/L (ref 22–32)
Calcium: 8.8 mg/dL — ABNORMAL LOW (ref 8.9–10.3)
Chloride: 109 mmol/L (ref 98–111)
Creatinine: 0.83 mg/dL (ref 0.44–1.00)
GFR, Est AFR Am: >60 mL/min (ref >60)
GFR, Estimated: >60 mL/min (ref >60)
Glucose, Bld: 154 mg/dL — ABNORMAL HIGH (ref 70–99)
Potassium: 3.8 mmol/L (ref 3.5–5.1)
Sodium: 143 mmol/L (ref 135–145)
Total Bilirubin: 0.3 mg/dL (ref 0.3–1.2)
Total Protein: 6.4 g/dL — ABNORMAL LOW (ref 6.5–8.1)

## 2019-03-16 MED ORDER — ACETAMINOPHEN 325 MG PO TABS
ORAL_TABLET | ORAL | Status: AC
  Filled 2019-03-16: qty 2

## 2019-03-16 MED ORDER — SODIUM CHLORIDE 0.9% FLUSH
10.0000 mL | INTRAVENOUS | Status: DC | PRN
  Administered 2019-03-16: 10 mL
  Filled 2019-03-16: qty 10

## 2019-03-16 MED ORDER — SODIUM CHLORIDE 0.9 % IV SOLN
Freq: Once | INTRAVENOUS | Status: AC
  Filled 2019-03-16: qty 250

## 2019-03-16 MED ORDER — PACLITAXEL PROTEIN-BOUND CHEMO INJECTION 100 MG
100.0000 mg/m2 | Freq: Once | INTRAVENOUS | Status: AC
  Administered 2019-03-16: 175 mg via INTRAVENOUS
  Filled 2019-03-16: qty 35

## 2019-03-16 MED ORDER — TRASTUZUMAB-ANNS CHEMO 150 MG IV SOLR
450.0000 mg | Freq: Once | INTRAVENOUS | Status: AC
  Administered 2019-03-16: 450 mg via INTRAVENOUS
  Filled 2019-03-16: qty 21.43

## 2019-03-16 MED ORDER — PROCHLORPERAZINE MALEATE 10 MG PO TABS
ORAL_TABLET | ORAL | Status: AC
  Filled 2019-03-16: qty 1

## 2019-03-16 MED ORDER — PROCHLORPERAZINE MALEATE 10 MG PO TABS
10.0000 mg | ORAL_TABLET | Freq: Once | ORAL | Status: AC
  Administered 2019-03-16: 10 mg via ORAL

## 2019-03-16 MED ORDER — DIPHENHYDRAMINE HCL 25 MG PO CAPS
50.0000 mg | ORAL_CAPSULE | Freq: Once | ORAL | Status: AC
  Administered 2019-03-16: 50 mg via ORAL

## 2019-03-16 MED ORDER — SODIUM CHLORIDE 0.9% FLUSH
10.0000 mL | INTRAVENOUS | Status: DC | PRN
  Administered 2019-03-16: 10 mL via INTRAVENOUS
  Filled 2019-03-16: qty 10

## 2019-03-16 MED ORDER — DIPHENHYDRAMINE HCL 25 MG PO CAPS
ORAL_CAPSULE | ORAL | Status: AC
  Filled 2019-03-16: qty 2

## 2019-03-16 MED ORDER — ACETAMINOPHEN 325 MG PO TABS
650.0000 mg | ORAL_TABLET | Freq: Once | ORAL | Status: AC
  Administered 2019-03-16: 650 mg via ORAL

## 2019-03-16 MED ORDER — HEPARIN SOD (PORK) LOCK FLUSH 100 UNIT/ML IV SOLN
500.0000 [IU] | Freq: Once | INTRAVENOUS | Status: AC | PRN
  Administered 2019-03-16: 500 [IU]
  Filled 2019-03-16: qty 5

## 2019-03-16 NOTE — Patient Instructions (Signed)
COVID-19 Vaccine Information can be found at: [email or web address] For questions related to vaccine distribution or appointments, please email [email or web address] or call [phone].   [place] Discharge Instructions for Patients Receiving Chemotherapy  Today you received the following chemotherapy agents: Trastuzumab-anns (Kanjinti) and Paclitaxel protein-bound (Abraxane)  To help prevent nausea and vomiting after your treatment, we encourage you to take your nausea medication as directed by your provider.   If you develop nausea and vomiting that is not controlled by your nausea medication, call the clinic.   BELOW ARE SYMPTOMS THAT SHOULD BE REPORTED IMMEDIATELY:  *FEVER GREATER THAN 100.5 F  *CHILLS WITH OR WITHOUT FEVER  NAUSEA AND VOMITING THAT IS NOT CONTROLLED WITH YOUR NAUSEA MEDICATION  *UNUSUAL SHORTNESS OF BREATH  *UNUSUAL BRUISING OR BLEEDING  TENDERNESS IN MOUTH AND THROAT WITH OR WITHOUT PRESENCE OF ULCERS  *URINARY PROBLEMS  *BOWEL PROBLEMS  UNUSUAL RASH Items with * indicate a potential emergency and should be followed up as soon as possible.  Feel free to call the clinic should you have any questions or concerns. The clinic phone number is [phone].  Please show the [facility] at check-in to the Emergency Department and triage nurse.  Coronavirus (COVID-19) Are you at risk?  Are you at risk for the Coronavirus (COVID-19)?  To be considered HIGH RISK for Coronavirus (COVID-19), you have to meet the following criteria:  . Traveled to [place], [place], [place], [place] or [place]; or in the [place] to [place], [place], [place], or [place]; and have fever, cough, and shortness of breath within the last 2 weeks of travel OR . Been in close contact with a person diagnosed with COVID-19 within the last 2 weeks and have fever, cough, and shortness of breath . IF  YOU DO NOT MEET THESE CRITERIA, YOU ARE CONSIDERED LOW RISK FOR COVID-19.  What to do if you are HIGH RISK for COVID-19?  Marland Kitchen If you are having a medical emergency, call 911. . Seek medical care right away. Before you go to a doctor's office, urgent care or emergency department, call ahead and tell them about your recent travel, contact with someone diagnosed with COVID-19, and your symptoms. You should receive instructions from your physician's office regarding next steps of care.  . When you arrive at healthcare provider, tell the healthcare staff immediately you have returned from visiting [place], [place], [place], [place] or [place]; or traveled in the [place] to [place], [place], [place], or [place]; in the last two weeks or you have been in close contact with a person diagnosed with COVID-19 in the last 2 weeks.   . Tell the health care staff about your symptoms: fever, cough and shortness of breath. . After you have been seen by a medical provider, you will be either: o Tested for (COVID-19) and discharged home on quarantine except to seek medical care if symptoms worsen, and asked to  - Stay home and avoid contact with others until you get your results (4-5 days)  - Avoid travel on public transportation if possible (such as bus, train, or airplane) or o Sent to the Emergency Department by EMS for evaluation, COVID-19 testing, and possible admission depending on your condition and test results.  What to do if you are LOW RISK for COVID-19?  Reduce your risk of any infection by using the same precautions used for avoiding the common cold or flu:  Marland Kitchen Wash your hands often with soap and  warm water for at least 20 seconds.  If soap and water are not readily available, use an alcohol-based hand sanitizer with at least 60% alcohol.  . If coughing or sneezing, cover your mouth and nose by coughing or sneezing into the elbow areas of your shirt or coat, into a tissue or into your sleeve  (not your hands). . Avoid shaking hands with others and consider head nods or verbal greetings only. . Avoid touching your eyes, nose, or mouth with unwashed hands.  . Avoid close contact with people who are sick. . Avoid places or events with large numbers of people in one location, like concerts or sporting events. . Carefully consider travel plans you have or are making. . If you are planning any travel outside or inside the [place], visit the CDC's Travelers' Health webpage for the latest health notices. . If you have some symptoms but not all symptoms, continue to monitor at home and seek medical attention if your symptoms worsen. . If you are having a medical emergency, call 911.   [place] / e-Visit: [email or web address]         [facility]: [facility]: [id number]                   [facility]: [phone]

## 2019-03-17 ENCOUNTER — Telehealth: Payer: Self-pay | Admitting: Hematology

## 2019-03-17 NOTE — Telephone Encounter (Signed)
No los per 2/24. 

## 2019-03-23 ENCOUNTER — Inpatient Hospital Stay: Payer: 59

## 2019-03-23 ENCOUNTER — Inpatient Hospital Stay: Payer: 59 | Attending: Hematology

## 2019-03-23 ENCOUNTER — Other Ambulatory Visit: Payer: Self-pay

## 2019-03-23 VITALS — BP 135/78 | HR 75 | Temp 97.9°F | Resp 18

## 2019-03-23 DIAGNOSIS — Z5111 Encounter for antineoplastic chemotherapy: Secondary | ICD-10-CM | POA: Insufficient documentation

## 2019-03-23 DIAGNOSIS — C50411 Malignant neoplasm of upper-outer quadrant of right female breast: Secondary | ICD-10-CM | POA: Diagnosis present

## 2019-03-23 DIAGNOSIS — Z170 Estrogen receptor positive status [ER+]: Secondary | ICD-10-CM

## 2019-03-23 DIAGNOSIS — Z95828 Presence of other vascular implants and grafts: Secondary | ICD-10-CM

## 2019-03-23 LAB — CBC WITH DIFFERENTIAL (CANCER CENTER ONLY)
Abs Immature Granulocytes: 0.07 10*3/uL (ref 0.00–0.07)
Basophils Absolute: 0.1 10*3/uL (ref 0.0–0.1)
Basophils Relative: 1 %
Eosinophils Absolute: 0.1 10*3/uL (ref 0.0–0.5)
Eosinophils Relative: 2 %
HCT: 35.2 % — ABNORMAL LOW (ref 36.0–46.0)
Hemoglobin: 12 g/dL (ref 12.0–15.0)
Immature Granulocytes: 2 %
Lymphocytes Relative: 35 %
Lymphs Abs: 1.5 10*3/uL (ref 0.7–4.0)
MCH: 30.2 pg (ref 26.0–34.0)
MCHC: 34.1 g/dL (ref 30.0–36.0)
MCV: 88.7 fL (ref 80.0–100.0)
Monocytes Absolute: 0.5 10*3/uL (ref 0.1–1.0)
Monocytes Relative: 11 %
Neutro Abs: 2.1 10*3/uL (ref 1.7–7.7)
Neutrophils Relative %: 49 %
Platelet Count: 205 10*3/uL (ref 150–400)
RBC: 3.97 MIL/uL (ref 3.87–5.11)
RDW: 12 % (ref 11.5–15.5)
WBC Count: 4.2 10*3/uL (ref 4.0–10.5)
nRBC: 0 % (ref 0.0–0.2)

## 2019-03-23 LAB — CMP (CANCER CENTER ONLY)
ALT: 80 U/L — ABNORMAL HIGH (ref 0–44)
AST: 33 U/L (ref 15–41)
Albumin: 3.6 g/dL (ref 3.5–5.0)
Alkaline Phosphatase: 81 U/L (ref 38–126)
Anion gap: 8 (ref 5–15)
BUN: 19 mg/dL (ref 6–20)
CO2: 26 mmol/L (ref 22–32)
Calcium: 8.7 mg/dL — ABNORMAL LOW (ref 8.9–10.3)
Chloride: 108 mmol/L (ref 98–111)
Creatinine: 0.82 mg/dL (ref 0.44–1.00)
GFR, Est AFR Am: >60 mL/min (ref >60)
GFR, Estimated: >60 mL/min (ref >60)
Glucose, Bld: 102 mg/dL — ABNORMAL HIGH (ref 70–99)
Potassium: 4.3 mmol/L (ref 3.5–5.1)
Sodium: 142 mmol/L (ref 135–145)
Total Bilirubin: 0.3 mg/dL (ref 0.3–1.2)
Total Protein: 6.2 g/dL — ABNORMAL LOW (ref 6.5–8.1)

## 2019-03-23 MED ORDER — HEPARIN SOD (PORK) LOCK FLUSH 100 UNIT/ML IV SOLN
500.0000 [IU] | Freq: Once | INTRAVENOUS | Status: AC | PRN
  Administered 2019-03-23: 500 [IU]
  Filled 2019-03-23: qty 5

## 2019-03-23 MED ORDER — PROCHLORPERAZINE MALEATE 10 MG PO TABS
ORAL_TABLET | ORAL | Status: AC
  Filled 2019-03-23: qty 1

## 2019-03-23 MED ORDER — PROCHLORPERAZINE MALEATE 10 MG PO TABS
10.0000 mg | ORAL_TABLET | Freq: Four times a day (QID) | ORAL | Status: DC | PRN
  Administered 2019-03-23: 10 mg via ORAL

## 2019-03-23 MED ORDER — PACLITAXEL PROTEIN-BOUND CHEMO INJECTION 100 MG
100.0000 mg/m2 | Freq: Once | INTRAVENOUS | Status: AC
  Administered 2019-03-23: 175 mg via INTRAVENOUS
  Filled 2019-03-23: qty 35

## 2019-03-23 MED ORDER — SODIUM CHLORIDE 0.9% FLUSH
10.0000 mL | INTRAVENOUS | Status: DC | PRN
  Administered 2019-03-23: 10 mL
  Filled 2019-03-23: qty 10

## 2019-03-23 MED ORDER — SODIUM CHLORIDE 0.9 % IV SOLN
Freq: Once | INTRAVENOUS | Status: AC
  Filled 2019-03-23: qty 250

## 2019-03-23 MED ORDER — SODIUM CHLORIDE 0.9% FLUSH
10.0000 mL | INTRAVENOUS | Status: DC | PRN
  Administered 2019-03-23: 10 mL via INTRAVENOUS
  Filled 2019-03-23: qty 10

## 2019-03-23 NOTE — Progress Notes (Signed)
ALT 80, okay to treat per Dr. Burr Medico.

## 2019-03-23 NOTE — Patient Instructions (Signed)
[facility] Discharge Instructions for Patients Receiving Chemotherapy  Today you received the following chemotherapy agents:  Abraxane.  To help prevent nausea and vomiting after your treatment, we encourage you to take your nausea medication as directed.   If you develop nausea and vomiting that is not controlled by your nausea medication, call the clinic.   BELOW ARE SYMPTOMS THAT SHOULD BE REPORTED IMMEDIATELY:  *FEVER GREATER THAN 100.5 F  *CHILLS WITH OR WITHOUT FEVER  NAUSEA AND VOMITING THAT IS NOT CONTROLLED WITH YOUR NAUSEA MEDICATION  *UNUSUAL SHORTNESS OF BREATH  *UNUSUAL BRUISING OR BLEEDING  TENDERNESS IN MOUTH AND THROAT WITH OR WITHOUT PRESENCE OF ULCERS  *URINARY PROBLEMS  *BOWEL PROBLEMS  UNUSUAL RASH Items with * indicate a potential emergency and should be followed up as soon as possible.  Feel free to call the clinic should you have any questions or concerns. The clinic phone number is [phone].  Please show the CHEMO ALERT CARD at check-in to the Emergency Department and triage nurse.   

## 2019-03-24 NOTE — Progress Notes (Signed)
[place]   [phone] [phone]   Clinic Follow up Note   Patient Care Team: Mellody Dance, DO as PCP - General (Family Medicine) Mcarthur Rossetti, MD as Consulting Physician (Orthopedic Surgery) Molli Posey, MD as Consulting Physician (Obstetrics and Gynecology) Erroll Luna, MD as Consulting Physician (General Surgery) Truitt Merle, MD as Consulting Physician (Hematology) Kyung Rudd, MD as Consulting Physician ([place]) Mauro Kaufmann, RN as Oncology Nurse Navigator Rockwell Germany, RN as Oncology Nurse Navigator  Date of Service:  03/30/2019  CHIEF COMPLAINT: Follow-up right breast cancer  SUMMARY OF ONCOLOGIC HISTORY: Oncology History Overview Note  Cancer Staging Malignant neoplasm of upper-outer quadrant of right breast in female, estrogen receptor positive ([facility]) Staging form: Breast, AJCC 8th Edition - Clinical stage from 12/29/2018: Stage IA (cT1b, cN0, cM0, G2, ER+, PR+, HER2+) - Unsigned - Pathologic stage from 01/27/2019: Stage IA (pT1c, pN0, cM0, G3, ER+, PR+, HER2+) - Signed by Truitt Merle, MD on 02/06/2019 Cancer Staging No matching staging information was found for the patient.    Malignant neoplasm of upper-outer quadrant of right breast in female, estrogen receptor positive ([place])  12/22/2018 Mammogram    Diagnostic Mammogram 12/22/18  IMPRESSION: 1. 9 mm mass, with associated calcifications and architectural distortion, in the 11 o'clock position of the right breast, highly suspicious for breast carcinoma. 2. Questionable mass noted in the inferior aspect of the right breast remains equivocal on diagnostic imaging may reflect focal fibroglandular tissue. There is no sonographic correlate.   12/23/2018 Initial Biopsy   Diagnosis 12/23/18 Breast, right, needle core biopsy, 11 o'clock position - INVASIVE DUCTAL CARCINOMA, SEE COMMENT. - DUCTAL CARCINOMA IN SITU WITH NECROSIS.   12/28/2018 Initial  Diagnosis   Malignant neoplasm of upper-outer quadrant of right breast in female, estrogen receptor positive ([place])   01/13/2019 Imaging   MRI Breast 01/13/19  IMPRESSION: 1. Biopsy proven malignancy in the upper-outer right breast. No additional suspicious findings on the right. 2. No MRI evidence of malignancy on the left. 3. No suspicious lymphadenopathy.   01/27/2019 Surgery   RIGHT BREAST LUMPECTOMY WITH RADIOACTIVE SEED AND SENTINEL LYMPH NODE MAPPING and PAC placement by Dr Brantley Stage  01/27/19   01/27/2019 Pathology Results   FINAL MICROSCOPIC DIAGNOSIS: 01/27/19  A. BREAST, RIGHT, LUMPECTOMY:  - Invasive ductal carcinoma, grade 3, spanning 1.3 cm.  - High grade ductal carcinoma in situ with necrosis.  - Biopsy site.  - Final resection margins are negative for carcinoma.  - See oncology table.   B. BREAST, RIGHT ADDITIONAL SUPERIOR MARGIN, EXCISION:  - Fibrocystic change and adenosis.  - No malignancy identified.   C. LYMPH NODE, RIGHT #1, SENTINEL, BIOPSY:  - One of one lymph nodes negative for carcinoma (0/1).   D. LYMPH NODE, RIGHT, SENTINEL, BIOPSY:  - One of one lymph nodes negative for carcinoma (0/1).   E. LYMPH NODE, RIGHT, SENTINEL, BIOPSY:  - One of one lymph nodes negative for carcinoma (0/1).   F. LYMPH NODE, RIGHT, SENTINEL, BIOPSY:  - One of one lymph nodes negative for carcinoma (0/1).       01/27/2019 Cancer Staging   Staging form: Breast, AJCC 8th Edition - Pathologic stage from 01/27/2019: Stage IA (pT1c, pN0, cM0, G3, ER+, PR+, HER2+) - Signed by Truitt Merle, MD on 02/06/2019   02/08/2019 Imaging   CT AP W Contrast 02/08/19  IMPRESSION: 1. Wall thickening of the gastric antrum may represent gastritis.   Aortic Atherosclerosis (ICD10-I70.0).   02/23/2019 -  Chemotherapy   Adjuvant weekly Abraxane with Herceptin q3weeks starting 02/23/19      CURRENT THERAPY:  Adjuvant weekly Abraxane with Herceptin q3weeks starting2/3/21  INTERVAL HISTORY:  Alicia Hahn is here for a follow up and treatment. She presents to the clinic alone. She notes she is doing well. She has been tolerating Taxol and Herceptin well. She does not right rib and upper leg pain occasionally. She denies neuropahty and has been doing ice bags during infusion. She denies skin rash on face but notes mild skin rash on scalp. She notes having diarrhea on weeks she gets herceptin.     REVIEW OF SYSTEMS:   Constitutional: Denies fevers, chills or abnormal weight loss Eyes: Denies blurriness of vision Ears, nose, mouth, throat, and face: Denies mucositis or sore throat Respiratory: Denies cough, dyspnea or wheezes Cardiovascular: Denies palpitation, chest discomfort or lower extremity swelling Gastrointestinal:  Denies nausea, heartburn or change in bowel habits Skin: (+) Mild skin rash of scalp MSK: (+) Right rib and upper leg pain, mild  Lymphatics: Denies new lymphadenopathy or easy bruising Neurological:Denies numbness, tingling or new weaknesses Behavioral/Psych: Mood is stable, no new changes  All other systems were reviewed with the patient and are negative.  MEDICAL HISTORY:  Past Medical History:  Diagnosis Date  . Cancer [facility])    breast cancer  . Hyperlipidemia     SURGICAL HISTORY: Past Surgical History:  Procedure Laterality Date  . BREAST LUMPECTOMY WITH RADIOACTIVE SEED AND SENTINEL LYMPH NODE BIOPSY Right 01/27/2019   Procedure: RIGHT BREAST LUMPECTOMY WITH RADIOACTIVE SEED AND SENTINEL LYMPH NODE MAPPING;  Surgeon: Erroll Luna, MD;  Location: [facility];  Service: General;  Laterality: Right;  . COSMETIC SURGERY Bilateral 2000   breast aug/ lipo  . PORTACATH PLACEMENT Right 01/27/2019   Procedure: INSERTION PORT-A-CATH WITH ULTRASOUND;  Surgeon: Erroll Luna, MD;  Location: [place];  Service: General;  Laterality: Right;    I have reviewed the social history and family history with the patient and they are  unchanged from previous note.  ALLERGIES:  has No Known Allergies.  MEDICATIONS:  Current Outpatient Medications  Medication Sig Dispense Refill  . ALPRAZolam (XANAX) 0.25 MG tablet Take 0.25 mg by mouth 3 (three) times daily as needed.    Marland Kitchen CALCIUM PO Take 1,000 mg by mouth daily.    Marland Kitchen lidocaine-prilocaine (EMLA) cream Apply to affected area once 30 g 3  . Magnesium 125 MG CAPS Take 1 capsule by mouth daily.     . Melatonin 3 MG CAPS Take 3 mg by mouth at bedtime.     . Multiple Vitamins-Minerals (MULTIVITAMIN WITH MINERALS) tablet Take 1 tablet by mouth daily.    . ondansetron (ZOFRAN ODT) 4 MG disintegrating tablet Take 1 tablet (4 mg total) by mouth every 8 (eight) hours as needed for nausea or vomiting. (Patient not taking: Reported on 02/02/2019) 20 tablet 0  . pantoprazole (PROTONIX) 40 MG tablet Take 1 tablet (40 mg total) by mouth daily. (Patient not taking: Reported on 03/16/2019) 30 tablet 3  . pantoprazole (PROTONIX) 40 MG tablet Take 1 tablet (40 mg total) by mouth 2 (two) times daily. 112 tablet 0  . prochlorperazine (COMPAZINE) 10 MG tablet Take 1 tablet (10 mg total) by mouth every 6 (six) hours as needed (Nausea or vomiting). (Patient not taking: Reported on 03/16/2019) 30 tablet 1  . traZODone (DESYREL) 50 MG tablet Take 1-2 tablets (50-100 mg total) by mouth at bedtime as  needed for sleep. 180 tablet 0  . VITAMIN D PO Take 12,000 Units by mouth daily.     No current facility-administered medications for this visit.   Facility-Administered Medications Ordered in Other Visits  Medication Dose Route Frequency Provider Last Rate Last Admin  . heparin lock flush 100 unit/mL  500 Units Intracatheter Once PRN Truitt Merle, MD      . PACLitaxel-protein bound (ABRAXANE) chemo infusion 175 mg  100 mg/m2 (Treatment Plan Recorded) Intravenous Once Truitt Merle, MD      . prochlorperazine (COMPAZINE) tablet 10 mg  10 mg Oral Q6H PRN Truitt Merle, MD      . sodium chloride flush (NS) 0.9 %  injection 10 mL  10 mL Intracatheter PRN Truitt Merle, MD        PHYSICAL EXAMINATION: ECOG PERFORMANCE STATUS: 1 - Symptomatic but completely ambulatory  Vitals:   03/30/19 1145  BP: (!) 133/95  Pulse: 79  Resp: 17  Temp: 98.3 F (36.8 C)  SpO2: 100%   Filed Weights   03/30/19 1145  Weight: 178 lb 8 oz (81 kg)    Due to COVID19 we will limit examination to appearance. Patient had no complaints.  GENERAL:alert, no distress and comfortable SKIN: skin color normal, no rashes or significant lesions EYES: normal, Conjunctiva are pink and non-injected, sclera clear  NEURO: alert & oriented x 3 with fluent speech   LABORATORY DATA:  I have reviewed the data as listed CBC Latest Ref Rng & Units 03/30/2019 03/23/2019 03/16/2019  WBC 4.0 - 10.5 K/uL 4.4 4.2 3.6(L)  Hemoglobin 12.0 - 15.0 g/dL 12.1 12.0 12.5  Hematocrit 36.0 - 46.0 % 35.2(L) 35.2(L) 36.4  Platelets 150 - 400 K/uL 194 205 219     CMP Latest Ref Rng & Units 03/30/2019 03/23/2019 03/16/2019  Glucose 70 - 99 mg/dL 156(H) 102(H) 154(H)  BUN 6 - 20 mg/dL '17 19 20  '$ Creatinine 0.44 - 1.00 mg/dL 0.81 0.82 0.83  Sodium 135 - 145 mmol/L 141 142 143  Potassium 3.5 - 5.1 mmol/L 3.9 4.3 3.8  Chloride 98 - 111 mmol/L 108 108 109  CO2 22 - 32 mmol/L '27 26 26  '$ Calcium 8.9 - 10.3 mg/dL 8.8(L) 8.7(L) 8.8(L)  Total Protein 6.5 - 8.1 g/dL 6.1(L) 6.2(L) 6.4(L)  Total Bilirubin 0.3 - 1.2 mg/dL 0.3 0.3 0.3  Alkaline Phos 38 - 126 U/L 77 81 88  AST 15 - 41 U/L 31 33 24  ALT 0 - 44 U/L 79(H) 80(H) 59(H)      RADIOGRAPHIC STUDIES: I have personally reviewed the radiological images as listed and agreed with the findings in the report. No results found.   ASSESSMENT & PLAN:  Alicia Hahn is a 59 y.o. female with    1.Malignant neoplasm of upper-outer quadrant of right breast,invasive ductal carcinoma,pT1cN0M0,stageIA,Triple Positive,Grade3 -She was recently diagnosed in 12/2018.She has 70m mass of right breast with  invasive ductal carcinoma and components of DCIS.She underwent right lumpectomy and SLNB on 01/27/19 by Dr CBrantley Stage  -Given the size of her tumor and HER2 positive disease she has higher risk of recurrence. I started her on adjuvant chemotherapy with Weekly Abraxane and Herceptin q3weeks for 12 weeks beginning 02/23/19. Plan to continue maintenance Herceptin to complete 1 year of treatment.  -After chemo she will also proceed with adjuvant Radiation and antiestrogen therapy to further reduce her risk of local and distant recurrence.  -S/p week 5 of treatment she is tolerating moderately well with Mild muscle pain of  right ribs and upper legs, mild skin rash and mild diarrhea on days of herceptin. She can take Tylenol for her pain.  -Labs reviewed and adequate to proceed with Herceptin and week 6 Taxol today. She will be halfway done with chemo today. After chemo she can switch her Herceptin to injections if she would like.  -F/u in 2 weeks  -I again encouraged her to proceed with COVID19 vaccine when it becomes available to her.   2. Gastric ulcers  -After surgery she took Motrin and started having severe epigastric pain and required ED visit.  -Her 02/25/19 Endoscopy with Dr. Tarri Glenn showed 5 small gastric ulcers, benign. She is currently on Protonix and will continues until she completes chemo. Will repeat endoscopy with Dr. Tarri Glenn 3 months later. She can request colonoscopy to be done same day.  -Pain is manageable on Protonix BID.    3. Estrogen replacement andpostmenopausal vaginalbleeding -Started after 2nd Mirena was taken out. Her Gyn put her on a third Mirena and bleeding stopped.  Vernia Buff estrogen replacement for several years for hot flashes. By her GYN. -Given her age she is likely postmenopausal and her given ER/PR positive breast cancer I previously  recommended she take her Mirena out. If she has further vaginal bleeding she should undergo workup for this with her Gyn.    4.  Genetics  -Based on family history her father has prostate cancer and she had significant family history of breast cancer. She is eligible for genetic testing.  -Pt notes she genetic testing before at [facility]. Will obtain records.   5. Transaminitis -Likely related to chemo, also possible related to Lipitor which started a few months ago -Her LFTs did improve after stopping lipitor.   -Follow-up lab weekly. If she has worsening transaminitis, I will reduce Abraxane dose or hold it if needed.   PLAN: -Lab reviewed and adequate to proceed with Herceptin and week 6 Abraxane today  -Continue labs and weekly Abraxane and Herceptin every 3 weeks  -F/u with me or NP Lacie every other week until she completes chemo.     No problem-specific Assessment & Plan notes found for this encounter.   No orders of the defined types were placed in this encounter.  All questions were answered. The patient knows to call the clinic with any problems, questions or concerns. No barriers to learning was detected. The total time spent in the appointment was 25 minutes.     Truitt Merle, MD 03/30/2019   I, Joslyn Devon, am acting as scribe for Truitt Merle, MD.   I have reviewed the above documentation for accuracy and completeness, and I agree with the above.

## 2019-03-29 ENCOUNTER — Encounter: Payer: Self-pay | Admitting: *Deleted

## 2019-03-30 ENCOUNTER — Inpatient Hospital Stay: Payer: 59

## 2019-03-30 ENCOUNTER — Other Ambulatory Visit: Payer: Self-pay

## 2019-03-30 ENCOUNTER — Encounter: Payer: Self-pay | Admitting: Hematology

## 2019-03-30 ENCOUNTER — Inpatient Hospital Stay (HOSPITAL_BASED_OUTPATIENT_CLINIC_OR_DEPARTMENT_OTHER): Payer: 59 | Admitting: Hematology

## 2019-03-30 VITALS — BP 133/95 | HR 79 | Temp 98.3°F | Resp 17 | Ht 63.0 in | Wt 178.5 lb

## 2019-03-30 DIAGNOSIS — C50411 Malignant neoplasm of upper-outer quadrant of right female breast: Secondary | ICD-10-CM

## 2019-03-30 DIAGNOSIS — Z170 Estrogen receptor positive status [ER+]: Secondary | ICD-10-CM

## 2019-03-30 DIAGNOSIS — Z95828 Presence of other vascular implants and grafts: Secondary | ICD-10-CM

## 2019-03-30 DIAGNOSIS — Z5111 Encounter for antineoplastic chemotherapy: Secondary | ICD-10-CM | POA: Diagnosis not present

## 2019-03-30 LAB — CBC WITH DIFFERENTIAL (CANCER CENTER ONLY)
Abs Immature Granulocytes: 0.09 10*3/uL — ABNORMAL HIGH (ref 0.00–0.07)
Basophils Absolute: 0 10*3/uL (ref 0.0–0.1)
Basophils Relative: 1 %
Eosinophils Absolute: 0.1 10*3/uL (ref 0.0–0.5)
Eosinophils Relative: 2 %
HCT: 35.2 % — ABNORMAL LOW (ref 36.0–46.0)
Hemoglobin: 12.1 g/dL (ref 12.0–15.0)
Immature Granulocytes: 2 %
Lymphocytes Relative: 32 %
Lymphs Abs: 1.4 10*3/uL (ref 0.7–4.0)
MCH: 31.1 pg (ref 26.0–34.0)
MCHC: 34.4 g/dL (ref 30.0–36.0)
MCV: 90.5 fL (ref 80.0–100.0)
Monocytes Absolute: 0.5 10*3/uL (ref 0.1–1.0)
Monocytes Relative: 10 %
Neutro Abs: 2.3 10*3/uL (ref 1.7–7.7)
Neutrophils Relative %: 53 %
Platelet Count: 194 10*3/uL (ref 150–400)
RBC: 3.89 MIL/uL (ref 3.87–5.11)
RDW: 12.6 % (ref 11.5–15.5)
WBC Count: 4.4 10*3/uL (ref 4.0–10.5)
nRBC: 0 % (ref 0.0–0.2)

## 2019-03-30 LAB — CMP (CANCER CENTER ONLY)
ALT: 79 U/L — ABNORMAL HIGH (ref 0–44)
AST: 31 U/L (ref 15–41)
Albumin: 3.6 g/dL (ref 3.5–5.0)
Alkaline Phosphatase: 77 U/L (ref 38–126)
Anion gap: 6 (ref 5–15)
BUN: 17 mg/dL (ref 6–20)
CO2: 27 mmol/L (ref 22–32)
Calcium: 8.8 mg/dL — ABNORMAL LOW (ref 8.9–10.3)
Chloride: 108 mmol/L (ref 98–111)
Creatinine: 0.81 mg/dL (ref 0.44–1.00)
GFR, Est AFR Am: >60 mL/min (ref >60)
GFR, Estimated: >60 mL/min (ref >60)
Glucose, Bld: 156 mg/dL — ABNORMAL HIGH (ref 70–99)
Potassium: 3.9 mmol/L (ref 3.5–5.1)
Sodium: 141 mmol/L (ref 135–145)
Total Bilirubin: 0.3 mg/dL (ref 0.3–1.2)
Total Protein: 6.1 g/dL — ABNORMAL LOW (ref 6.5–8.1)

## 2019-03-30 MED ORDER — SODIUM CHLORIDE 0.9% FLUSH
10.0000 mL | INTRAVENOUS | Status: DC | PRN
  Administered 2019-03-30: 10 mL
  Filled 2019-03-30: qty 10

## 2019-03-30 MED ORDER — HEPARIN SOD (PORK) LOCK FLUSH 100 UNIT/ML IV SOLN
500.0000 [IU] | Freq: Once | INTRAVENOUS | Status: AC | PRN
  Administered 2019-03-30: 500 [IU]
  Filled 2019-03-30: qty 5

## 2019-03-30 MED ORDER — SODIUM CHLORIDE 0.9% FLUSH
10.0000 mL | Freq: Once | INTRAVENOUS | Status: AC
  Administered 2019-03-30: 10 mL
  Filled 2019-03-30: qty 10

## 2019-03-30 MED ORDER — PROCHLORPERAZINE MALEATE 10 MG PO TABS
10.0000 mg | ORAL_TABLET | Freq: Four times a day (QID) | ORAL | Status: DC | PRN
  Administered 2019-03-30: 10 mg via ORAL

## 2019-03-30 MED ORDER — SODIUM CHLORIDE 0.9 % IV SOLN
Freq: Once | INTRAVENOUS | Status: AC
  Filled 2019-03-30: qty 250

## 2019-03-30 MED ORDER — PACLITAXEL PROTEIN-BOUND CHEMO INJECTION 100 MG
100.0000 mg/m2 | Freq: Once | INTRAVENOUS | Status: AC
  Administered 2019-03-30: 175 mg via INTRAVENOUS
  Filled 2019-03-30: qty 35

## 2019-03-30 MED ORDER — PROCHLORPERAZINE MALEATE 10 MG PO TABS
ORAL_TABLET | ORAL | Status: AC
  Filled 2019-03-30: qty 1

## 2019-03-30 NOTE — Patient Instructions (Signed)
[facility] Discharge Instructions for Patients Receiving Chemotherapy  Today you received the following chemotherapy agents:  Abraxane.  To help prevent nausea and vomiting after your treatment, we encourage you to take your nausea medication as directed.   If you develop nausea and vomiting that is not controlled by your nausea medication, call the clinic.   BELOW ARE SYMPTOMS THAT SHOULD BE REPORTED IMMEDIATELY:  *FEVER GREATER THAN 100.5 F  *CHILLS WITH OR WITHOUT FEVER  NAUSEA AND VOMITING THAT IS NOT CONTROLLED WITH YOUR NAUSEA MEDICATION  *UNUSUAL SHORTNESS OF BREATH  *UNUSUAL BRUISING OR BLEEDING  TENDERNESS IN MOUTH AND THROAT WITH OR WITHOUT PRESENCE OF ULCERS  *URINARY PROBLEMS  *BOWEL PROBLEMS  UNUSUAL RASH Items with * indicate a potential emergency and should be followed up as soon as possible.  Feel free to call the clinic should you have any questions or concerns. The clinic phone number is [phone].  Please show the CHEMO ALERT CARD at check-in to the Emergency Department and triage nurse.   

## 2019-03-31 ENCOUNTER — Telehealth: Payer: Self-pay | Admitting: Hematology

## 2019-03-31 NOTE — Telephone Encounter (Signed)
No los per 3/10. °

## 2019-04-06 ENCOUNTER — Inpatient Hospital Stay: Payer: 59

## 2019-04-06 ENCOUNTER — Ambulatory Visit: Payer: 59 | Admitting: Family Medicine

## 2019-04-06 ENCOUNTER — Other Ambulatory Visit: Payer: Self-pay

## 2019-04-06 VITALS — BP 129/69 | HR 77 | Temp 98.3°F | Resp 17 | Wt 177.8 lb

## 2019-04-06 DIAGNOSIS — C50411 Malignant neoplasm of upper-outer quadrant of right female breast: Secondary | ICD-10-CM

## 2019-04-06 DIAGNOSIS — Z95828 Presence of other vascular implants and grafts: Secondary | ICD-10-CM

## 2019-04-06 DIAGNOSIS — Z170 Estrogen receptor positive status [ER+]: Secondary | ICD-10-CM

## 2019-04-06 DIAGNOSIS — Z5111 Encounter for antineoplastic chemotherapy: Secondary | ICD-10-CM | POA: Diagnosis not present

## 2019-04-06 LAB — CBC WITH DIFFERENTIAL (CANCER CENTER ONLY)
Abs Immature Granulocytes: 0.06 10*3/uL (ref 0.00–0.07)
Basophils Absolute: 0 10*3/uL (ref 0.0–0.1)
Basophils Relative: 1 %
Eosinophils Absolute: 0.1 10*3/uL (ref 0.0–0.5)
Eosinophils Relative: 3 %
HCT: 34.3 % — ABNORMAL LOW (ref 36.0–46.0)
Hemoglobin: 12 g/dL (ref 12.0–15.0)
Immature Granulocytes: 2 %
Lymphocytes Relative: 31 %
Lymphs Abs: 1.2 10*3/uL (ref 0.7–4.0)
MCH: 31.7 pg (ref 26.0–34.0)
MCHC: 35 g/dL (ref 30.0–36.0)
MCV: 90.5 fL (ref 80.0–100.0)
Monocytes Absolute: 0.5 10*3/uL (ref 0.1–1.0)
Monocytes Relative: 11 %
Neutro Abs: 2.1 10*3/uL (ref 1.7–7.7)
Neutrophils Relative %: 52 %
Platelet Count: 224 10*3/uL (ref 150–400)
RBC: 3.79 MIL/uL — ABNORMAL LOW (ref 3.87–5.11)
RDW: 12.8 % (ref 11.5–15.5)
WBC Count: 4 10*3/uL (ref 4.0–10.5)
nRBC: 0 % (ref 0.0–0.2)

## 2019-04-06 LAB — CMP (CANCER CENTER ONLY)
ALT: 88 U/L — ABNORMAL HIGH (ref 0–44)
AST: 41 U/L (ref 15–41)
Albumin: 3.7 g/dL (ref 3.5–5.0)
Alkaline Phosphatase: 79 U/L (ref 38–126)
Anion gap: 8 (ref 5–15)
BUN: 15 mg/dL (ref 6–20)
CO2: 25 mmol/L (ref 22–32)
Calcium: 8.7 mg/dL — ABNORMAL LOW (ref 8.9–10.3)
Chloride: 108 mmol/L (ref 98–111)
Creatinine: 0.9 mg/dL (ref 0.44–1.00)
GFR, Est AFR Am: >60 mL/min (ref >60)
GFR, Estimated: >60 mL/min (ref >60)
Glucose, Bld: 112 mg/dL — ABNORMAL HIGH (ref 70–99)
Potassium: 4.1 mmol/L (ref 3.5–5.1)
Sodium: 141 mmol/L (ref 135–145)
Total Bilirubin: 0.3 mg/dL (ref 0.3–1.2)
Total Protein: 6.2 g/dL — ABNORMAL LOW (ref 6.5–8.1)

## 2019-04-06 MED ORDER — SODIUM CHLORIDE 0.9 % IV SOLN
Freq: Once | INTRAVENOUS | Status: AC
  Filled 2019-04-06: qty 250

## 2019-04-06 MED ORDER — DIPHENHYDRAMINE HCL 25 MG PO CAPS
ORAL_CAPSULE | ORAL | Status: AC
  Filled 2019-04-06: qty 2

## 2019-04-06 MED ORDER — SODIUM CHLORIDE 0.9% FLUSH
10.0000 mL | INTRAVENOUS | Status: DC | PRN
  Administered 2019-04-06: 10 mL
  Filled 2019-04-06: qty 10

## 2019-04-06 MED ORDER — SODIUM CHLORIDE 0.9% FLUSH
10.0000 mL | Freq: Once | INTRAVENOUS | Status: AC
  Administered 2019-04-06: 10 mL
  Filled 2019-04-06: qty 10

## 2019-04-06 MED ORDER — PROCHLORPERAZINE MALEATE 10 MG PO TABS
10.0000 mg | ORAL_TABLET | Freq: Four times a day (QID) | ORAL | Status: DC | PRN
  Administered 2019-04-06: 10 mg via ORAL

## 2019-04-06 MED ORDER — ACETAMINOPHEN 325 MG PO TABS
650.0000 mg | ORAL_TABLET | Freq: Once | ORAL | Status: AC
  Administered 2019-04-06: 650 mg via ORAL

## 2019-04-06 MED ORDER — ACETAMINOPHEN 325 MG PO TABS
ORAL_TABLET | ORAL | Status: AC
  Filled 2019-04-06: qty 2

## 2019-04-06 MED ORDER — PACLITAXEL PROTEIN-BOUND CHEMO INJECTION 100 MG
100.0000 mg/m2 | Freq: Once | INTRAVENOUS | Status: AC
  Administered 2019-04-06: 175 mg via INTRAVENOUS
  Filled 2019-04-06: qty 35

## 2019-04-06 MED ORDER — TRASTUZUMAB-ANNS CHEMO 150 MG IV SOLR
450.0000 mg | Freq: Once | INTRAVENOUS | Status: AC
  Administered 2019-04-06: 450 mg via INTRAVENOUS
  Filled 2019-04-06: qty 21.43

## 2019-04-06 MED ORDER — PROCHLORPERAZINE MALEATE 10 MG PO TABS
ORAL_TABLET | ORAL | Status: AC
  Filled 2019-04-06: qty 1

## 2019-04-06 MED ORDER — HEPARIN SOD (PORK) LOCK FLUSH 100 UNIT/ML IV SOLN
500.0000 [IU] | Freq: Once | INTRAVENOUS | Status: AC | PRN
  Administered 2019-04-06: 500 [IU]
  Filled 2019-04-06: qty 5

## 2019-04-06 MED ORDER — DIPHENHYDRAMINE HCL 25 MG PO CAPS
50.0000 mg | ORAL_CAPSULE | Freq: Once | ORAL | Status: AC
  Administered 2019-04-06: 50 mg via ORAL

## 2019-04-06 NOTE — Patient Instructions (Signed)
[facility] Discharge Instructions for Patients Receiving Chemotherapy  Today you received the following chemotherapy agents: Abraxane/trastuzumab.  To help prevent nausea and vomiting after your treatment, we encourage you to take your nausea medication as directed.   If you develop nausea and vomiting that is not controlled by your nausea medication, call the clinic.   BELOW ARE SYMPTOMS THAT SHOULD BE REPORTED IMMEDIATELY:  *FEVER GREATER THAN 100.5 F  *CHILLS WITH OR WITHOUT FEVER  NAUSEA AND VOMITING THAT IS NOT CONTROLLED WITH YOUR NAUSEA MEDICATION  *UNUSUAL SHORTNESS OF BREATH  *UNUSUAL BRUISING OR BLEEDING  TENDERNESS IN MOUTH AND THROAT WITH OR WITHOUT PRESENCE OF ULCERS  *URINARY PROBLEMS  *BOWEL PROBLEMS  UNUSUAL RASH Items with * indicate a potential emergency and should be followed up as soon as possible.  Feel free to call the clinic should you have any questions or concerns. The clinic phone number is [phone].  Please show the [place] at check-in to the Emergency Department and triage nurse.

## 2019-04-06 NOTE — Progress Notes (Signed)
Per Dr. Burr Medico ok to treat with ALT 88.

## 2019-04-07 ENCOUNTER — Other Ambulatory Visit (HOSPITAL_COMMUNITY): Payer: Self-pay | Admitting: *Deleted

## 2019-04-12 ENCOUNTER — Other Ambulatory Visit: Payer: 59

## 2019-04-12 NOTE — Progress Notes (Signed)
[place]   [phone] [phone]   Clinic Follow up Note   Patient Care Team: Mellody Dance, DO as PCP - General (Family Medicine) Mcarthur Rossetti, MD as Consulting Physician (Orthopedic Surgery) Molli Posey, MD as Consulting Physician (Obstetrics and Gynecology) Erroll Luna, MD as Consulting Physician (General Surgery) Truitt Merle, MD as Consulting Physician (Hematology) Kyung Rudd, MD as Consulting Physician ([place]) Mauro Kaufmann, RN as Oncology Nurse Navigator Rockwell Germany, RN as Oncology Nurse Navigator 04/13/2019  CHIEF COMPLAINT: F/u right breast cancer   SUMMARY OF ONCOLOGIC HISTORY: Oncology History Overview Note  Cancer Staging Malignant neoplasm of upper-outer quadrant of right breast in female, estrogen receptor positive ([place]) Staging form: Breast, AJCC 8th Edition - Clinical stage from 12/29/2018: Stage IA (cT1b, cN0, cM0, G2, ER+, PR+, HER2+) - Unsigned - Pathologic stage from 01/27/2019: Stage IA (pT1c, pN0, cM0, G3, ER+, PR+, HER2+) - Signed by Truitt Merle, MD on 02/06/2019 Cancer Staging No matching staging information was found for the patient.    Malignant neoplasm of upper-outer quadrant of right breast in female, estrogen receptor positive ([place])  12/22/2018 Mammogram    Diagnostic Mammogram 12/22/18  IMPRESSION: 1. 9 mm mass, with associated calcifications and architectural distortion, in the 11 o'clock position of the right breast, highly suspicious for breast carcinoma. 2. Questionable mass noted in the inferior aspect of the right breast remains equivocal on diagnostic imaging may reflect focal fibroglandular tissue. There is no sonographic correlate.   12/23/2018 Initial Biopsy   Diagnosis 12/23/18 Breast, right, needle core biopsy, 11 o'clock position - INVASIVE DUCTAL CARCINOMA, SEE COMMENT. - DUCTAL CARCINOMA IN SITU WITH NECROSIS.   12/28/2018 Initial Diagnosis   Malignant  neoplasm of upper-outer quadrant of right breast in female, estrogen receptor positive ([place])   01/13/2019 Imaging   MRI Breast 01/13/19  IMPRESSION: 1. Biopsy proven malignancy in the upper-outer right breast. No additional suspicious findings on the right. 2. No MRI evidence of malignancy on the left. 3. No suspicious lymphadenopathy.   01/27/2019 Surgery   RIGHT BREAST LUMPECTOMY WITH RADIOACTIVE SEED AND SENTINEL LYMPH NODE MAPPING and PAC placement by Dr Brantley Stage  01/27/19   01/27/2019 Pathology Results   FINAL MICROSCOPIC DIAGNOSIS: 01/27/19  A. BREAST, RIGHT, LUMPECTOMY:  - Invasive ductal carcinoma, grade 3, spanning 1.3 cm.  - High grade ductal carcinoma in situ with necrosis.  - Biopsy site.  - Final resection margins are negative for carcinoma.  - See oncology table.   B. BREAST, RIGHT ADDITIONAL SUPERIOR MARGIN, EXCISION:  - Fibrocystic change and adenosis.  - No malignancy identified.   C. LYMPH NODE, RIGHT #1, SENTINEL, BIOPSY:  - One of one lymph nodes negative for carcinoma (0/1).   D. LYMPH NODE, RIGHT, SENTINEL, BIOPSY:  - One of one lymph nodes negative for carcinoma (0/1).   E. LYMPH NODE, RIGHT, SENTINEL, BIOPSY:  - One of one lymph nodes negative for carcinoma (0/1).   F. LYMPH NODE, RIGHT, SENTINEL, BIOPSY:  - One of one lymph nodes negative for carcinoma (0/1).       01/27/2019 Cancer Staging   Staging form: Breast, AJCC 8th Edition - Pathologic stage from 01/27/2019: Stage IA (pT1c, pN0, cM0, G3, ER+, PR+, HER2+) - Signed by Truitt Merle, MD on 02/06/2019   02/08/2019 Imaging   CT AP W Contrast 02/08/19  IMPRESSION: 1. Wall thickening of the gastric antrum may represent gastritis.   Aortic Atherosclerosis (ICD10-I70.0).   02/23/2019 -  Chemotherapy   Adjuvant  weekly Abraxane with Herceptin q3weeks starting 02/23/19     CURRENT THERAPY: Adjuvant weekly Abraxane with Herceptin q3weeks starting2/3/21  INTERVAL HISTORY: Ms. Kucharski returns for f/u and  treatment as scheduled. She completed week 7 adjuvant Abraxane and cycle 3 Herceptin on 04/06/19. She had an echo and was seen by Dr. Haroldine Laws earlier today. She is doing well. Mild body aches in right rib and thighs are unchanged. Took tylenol once. She did not have diarrhea after last herceptin, no n/v/c. Denies fever, chills, cough, chest pain, dyspnea, neuropathy, mucositis. Her scalp rash is mild. Energy and appetite are good. She felt a "knot" in the right breast that is not there anymore.     MEDICAL HISTORY:  Past Medical History:  Diagnosis Date  . Cancer [facility])    breast cancer  . Hyperlipidemia     SURGICAL HISTORY: Past Surgical History:  Procedure Laterality Date  . BREAST LUMPECTOMY WITH RADIOACTIVE SEED AND SENTINEL LYMPH NODE BIOPSY Right 01/27/2019   Procedure: RIGHT BREAST LUMPECTOMY WITH RADIOACTIVE SEED AND SENTINEL LYMPH NODE MAPPING;  Surgeon: Erroll Luna, MD;  Location: [place];  Service: General;  Laterality: Right;  . COSMETIC SURGERY Bilateral 2000   breast aug/ lipo  . PORTACATH PLACEMENT Right 01/27/2019   Procedure: INSERTION PORT-A-CATH WITH ULTRASOUND;  Surgeon: Erroll Luna, MD;  Location: [place];  Service: General;  Laterality: Right;    I have reviewed the social history and family history with the patient and they are unchanged from previous note.  ALLERGIES:  has No Known Allergies.  MEDICATIONS:  Current Outpatient Medications  Medication Sig Dispense Refill  . CALCIUM PO Take 1,000 mg by mouth daily.    Marland Kitchen lidocaine-prilocaine (EMLA) cream Apply to affected area once 30 g 3  . Magnesium 125 MG CAPS Take 1 capsule by mouth daily.     . Melatonin 3 MG CAPS Take 3 mg by mouth at bedtime.     . Multiple Vitamins-Minerals (MULTIVITAMIN WITH MINERALS) tablet Take 1 tablet by mouth daily.    . pantoprazole (PROTONIX) 40 MG tablet Take 1 tablet (40 mg total) by mouth 2 (two) times daily. 112 tablet 0  . traZODone  (DESYREL) 50 MG tablet Take 1-2 tablets (50-100 mg total) by mouth at bedtime as needed for sleep. 180 tablet 0  . VITAMIN D PO Take 12,000 Units by mouth daily.    Marland Kitchen ALPRAZolam (XANAX) 0.25 MG tablet Take 0.25 mg by mouth 3 (three) times daily as needed.    . prochlorperazine (COMPAZINE) 10 MG tablet Take 1 tablet (10 mg total) by mouth every 6 (six) hours as needed (Nausea or vomiting). 30 tablet 1   No current facility-administered medications for this visit.   Facility-Administered Medications Ordered in Other Visits  Medication Dose Route Frequency Provider Last Rate Last Admin  . heparin lock flush 100 unit/mL  500 Units Intracatheter Once PRN Truitt Merle, MD      . PACLitaxel-protein bound (ABRAXANE) chemo infusion 175 mg  100 mg/m2 (Treatment Plan Recorded) Intravenous Once Truitt Merle, MD      . prochlorperazine (COMPAZINE) tablet 10 mg  10 mg Oral Q6H PRN Truitt Merle, MD   10 mg at 04/13/19 1430  . sodium chloride flush (NS) 0.9 % injection 10 mL  10 mL Intracatheter PRN Truitt Merle, MD        PHYSICAL EXAMINATION: ECOG PERFORMANCE STATUS: 1 - Symptomatic but completely ambulatory  Vitals:   04/13/19 1348  BP: (!) 142/77  Pulse: 68  Resp: 18  Temp: 98 F (36.7 C)  SpO2: 100%   Filed Weights   04/13/19 1348  Weight: 177 lb 9.6 oz (80.6 kg)    GENERAL:alert, no distress and comfortable SKIN: mild rash to scalp  EYES:  sclera clear OROPHARYNX: no thrush or ulcers LUNGS:  normal breathing effort HEART: trace lower extremity edema at the ankles  NEURO: alert & oriented x 3 with fluent speech, normal gait Breast: limited breast exam performed due to patient feeling a knot near her areola a few days ago. Right breast s/p lumpectomy. Incisions healed. No palpable mass in the right breast that I could appreciate.  PAC without erythema   LABORATORY DATA:  I have reviewed the data as listed CBC Latest Ref Rng & Units 04/13/2019 04/06/2019 03/30/2019  WBC 4.0 - 10.5 K/uL 3.6(L) 4.0  4.4  Hemoglobin 12.0 - 15.0 g/dL 11.6(L) 12.0 12.1  Hematocrit 36.0 - 46.0 % 33.8(L) 34.3(L) 35.2(L)  Platelets 150 - 400 K/uL 207 224 194     CMP Latest Ref Rng & Units 04/13/2019 04/06/2019 03/30/2019  Glucose 70 - 99 mg/dL 98 112(H) 156(H)  BUN 6 - 20 mg/dL '16 15 17  '$ Creatinine 0.44 - 1.00 mg/dL 0.89 0.90 0.81  Sodium 135 - 145 mmol/L 141 141 141  Potassium 3.5 - 5.1 mmol/L 4.0 4.1 3.9  Chloride 98 - 111 mmol/L 109 108 108  CO2 22 - 32 mmol/L '26 25 27  '$ Calcium 8.9 - 10.3 mg/dL 8.9 8.7(L) 8.8(L)  Total Protein 6.5 - 8.1 g/dL 6.3(L) 6.2(L) 6.1(L)  Total Bilirubin 0.3 - 1.2 mg/dL 0.3 0.3 0.3  Alkaline Phos 38 - 126 U/L 82 79 77  AST 15 - 41 U/L 51(H) 41 31  ALT 0 - 44 U/L 116(H) 88(H) 79(H)      RADIOGRAPHIC STUDIES: I have personally reviewed the radiological images as listed and agreed with the findings in the report. No results found.   ASSESSMENT & PLAN: Melonie Germani is a 59 y.o. female with    1.Malignant neoplasm of upper-outer quadrant of right breast,invasive ductal carcinoma,pT1cN0M0,stageIA,Triple Positive,Grade3 -Diagnosed in 12/2018 with invasive ductal carcinoma and components of DCIS.She underwent right lumpectomy and SLNB on 01/27/19 by Dr Brantley Stage.  -to reduce recurrence risk, Dr. Burr Medico recommended adjuvant chemotherapy with Weekly Abraxane and Herceptin q3weeks for 12 weeksbeginning2/3/21. Plan to continue maintenance Herceptin to complete 1 year of treatment. She is interested in Herceptin injection after she completes Abraxane  -After chemo she will also proceed with adjuvant Radiation and antiestrogen therapy to further reduce her risk of local and distant recurrence. -Ms. Daggs appears well. She completed week 7 adjuvant Abraxane and continues Herceptin. She is tolerating treatment well with mild body aches and scalp rash. No significant GI side effects recently. She recovers well.  -Her right breast exam revealed no palpable abnormality  that I could appreciate, normal post lumpectomy exam.  -She had an echo and f/u with Dr. Haroldine Laws today, the report and note are pending  -We reviewed the CBC and CMP from today. ANC 1.6, we discussed neutropenic precautions. LFTs are increased. OK to proceed with week 8 Abraxane today at current dose. Will monitor carefully and dose-reduce or hold if needed.  -she will return for weekly Abraxane to complete 12 cycles and continue Herceptin q3 weeks for 1 year. -F/u in 2 weeks   2. Gastric ulcers  -After surgery she took Motrin and started having severe epigastric pain and required ED visit.  -Her  02/25/19 Endoscopy with Dr. Tarri Glenn showed 5 small gastric ulcers, benign.  -on Protonix BID until she completes chemo.  -Will repeat endoscopy with Dr. Tarri Glenn 3 months later.She can request colonoscopy to be done same day.  3. Estrogen replacement andpostmenopausal vaginalbleeding -Started after 2nd Mirena was taken out. Her Gyn put her on a third Mirena and bleeding stopped.  Vernia Buff estrogen replacement for several years for hot flashes. By her GYN. -Given her age she is likely postmenopausal and her given ER/PR positive breast cancer Ipreviouslyrecommendedshe take her Mirena out. If she has further vaginal bleeding she should undergo workup for this with her Gyn.   4. Genetics  -Based on family history her father has prostate cancer and she had significant family history of breast cancer. She is eligible for genetic testing.  -per report 06/17/2017 at [facility] using [employer] genetic tests, negative for pathogenic mutation. She had 30.5% lifetime risk for breast cancer, and 6.3% 5-year risk  5. Transaminitis -Likely related to chemo, also possible related to Lipitor which started a few months ago -Her LFTs did improve after stopping lipitor. -LFTs are slightly increased, OK to proceed with abraxane today. Will watch closely   PLAN: -Labs reviewed -Proceed with week 8  Abraxane today, to complete 12 cycles  -Continue Herceptin q3 weeks for total 1 year, next dose 04/27/19 -F/u in 2 weeks  All questions were answered. The patient knows to call the clinic with any problems, questions or concerns. No barriers to learning were detected.     Alla Feeling, NP 04/13/19

## 2019-04-13 ENCOUNTER — Inpatient Hospital Stay: Payer: 59

## 2019-04-13 ENCOUNTER — Ambulatory Visit (HOSPITAL_COMMUNITY)
Admission: RE | Admit: 2019-04-13 | Discharge: 2019-04-13 | Disposition: A | Discharge status: Home / Self Care | Payer: 59 | Source: Ambulatory Visit | Attending: Internal Medicine | Admitting: Internal Medicine

## 2019-04-13 ENCOUNTER — Other Ambulatory Visit (HOSPITAL_COMMUNITY): Payer: 59

## 2019-04-13 ENCOUNTER — Inpatient Hospital Stay (HOSPITAL_BASED_OUTPATIENT_CLINIC_OR_DEPARTMENT_OTHER): Payer: 59 | Admitting: Nurse Practitioner

## 2019-04-13 ENCOUNTER — Other Ambulatory Visit: Payer: Self-pay

## 2019-04-13 ENCOUNTER — Ambulatory Visit (HOSPITAL_BASED_OUTPATIENT_CLINIC_OR_DEPARTMENT_OTHER)
Admission: RE | Admit: 2019-04-13 | Discharge: 2019-04-13 | Disposition: A | Discharge status: Home / Self Care | Payer: 59 | Source: Ambulatory Visit | Attending: Internal Medicine | Admitting: Internal Medicine

## 2019-04-13 ENCOUNTER — Encounter: Payer: Self-pay | Admitting: Nurse Practitioner

## 2019-04-13 ENCOUNTER — Encounter (HOSPITAL_COMMUNITY): Payer: Self-pay | Admitting: Internal Medicine

## 2019-04-13 VITALS — BP 130/66 | HR 73 | Wt 177.8 lb

## 2019-04-13 VITALS — BP 142/77 | HR 68 | Temp 98.0°F | Resp 18 | Ht 63.0 in | Wt 177.6 lb

## 2019-04-13 DIAGNOSIS — Z79899 Other long term (current) drug therapy: Secondary | ICD-10-CM | POA: Insufficient documentation

## 2019-04-13 DIAGNOSIS — Z8249 Family history of ischemic heart disease and other diseases of the circulatory system: Secondary | ICD-10-CM | POA: Diagnosis not present

## 2019-04-13 DIAGNOSIS — C50411 Malignant neoplasm of upper-outer quadrant of right female breast: Secondary | ICD-10-CM | POA: Insufficient documentation

## 2019-04-13 DIAGNOSIS — Z833 Family history of diabetes mellitus: Secondary | ICD-10-CM | POA: Insufficient documentation

## 2019-04-13 DIAGNOSIS — Z170 Estrogen receptor positive status [ER+]: Secondary | ICD-10-CM

## 2019-04-13 DIAGNOSIS — Z803 Family history of malignant neoplasm of breast: Secondary | ICD-10-CM | POA: Diagnosis not present

## 2019-04-13 DIAGNOSIS — Z5181 Encounter for therapeutic drug level monitoring: Secondary | ICD-10-CM | POA: Diagnosis present

## 2019-04-13 DIAGNOSIS — I517 Cardiomegaly: Secondary | ICD-10-CM | POA: Diagnosis not present

## 2019-04-13 DIAGNOSIS — Z8042 Family history of malignant neoplasm of prostate: Secondary | ICD-10-CM | POA: Insufficient documentation

## 2019-04-13 DIAGNOSIS — Z87891 Personal history of nicotine dependence: Secondary | ICD-10-CM | POA: Insufficient documentation

## 2019-04-13 DIAGNOSIS — Z5111 Encounter for antineoplastic chemotherapy: Secondary | ICD-10-CM | POA: Diagnosis not present

## 2019-04-13 DIAGNOSIS — R600 Localized edema: Secondary | ICD-10-CM | POA: Insufficient documentation

## 2019-04-13 DIAGNOSIS — Z95828 Presence of other vascular implants and grafts: Secondary | ICD-10-CM

## 2019-04-13 LAB — CMP (CANCER CENTER ONLY)
ALT: 116 U/L — ABNORMAL HIGH (ref 0–44)
AST: 51 U/L — ABNORMAL HIGH (ref 15–41)
Albumin: 3.7 g/dL (ref 3.5–5.0)
Alkaline Phosphatase: 82 U/L (ref 38–126)
Anion gap: 6 (ref 5–15)
BUN: 16 mg/dL (ref 6–20)
CO2: 26 mmol/L (ref 22–32)
Calcium: 8.9 mg/dL (ref 8.9–10.3)
Chloride: 109 mmol/L (ref 98–111)
Creatinine: 0.89 mg/dL (ref 0.44–1.00)
GFR, Est AFR Am: >60 mL/min (ref >60)
GFR, Estimated: >60 mL/min (ref >60)
Glucose, Bld: 98 mg/dL (ref 70–99)
Potassium: 4 mmol/L (ref 3.5–5.1)
Sodium: 141 mmol/L (ref 135–145)
Total Bilirubin: 0.3 mg/dL (ref 0.3–1.2)
Total Protein: 6.3 g/dL — ABNORMAL LOW (ref 6.5–8.1)

## 2019-04-13 LAB — CBC WITH DIFFERENTIAL (CANCER CENTER ONLY)
Abs Immature Granulocytes: 0.07 10*3/uL (ref 0.00–0.07)
Basophils Absolute: 0 10*3/uL (ref 0.0–0.1)
Basophils Relative: 1 %
Eosinophils Absolute: 0.1 10*3/uL (ref 0.0–0.5)
Eosinophils Relative: 3 %
HCT: 33.8 % — ABNORMAL LOW (ref 36.0–46.0)
Hemoglobin: 11.6 g/dL — ABNORMAL LOW (ref 12.0–15.0)
Immature Granulocytes: 2 %
Lymphocytes Relative: 38 %
Lymphs Abs: 1.4 10*3/uL (ref 0.7–4.0)
MCH: 31 pg (ref 26.0–34.0)
MCHC: 34.3 g/dL (ref 30.0–36.0)
MCV: 90.4 fL (ref 80.0–100.0)
Monocytes Absolute: 0.5 10*3/uL (ref 0.1–1.0)
Monocytes Relative: 13 %
Neutro Abs: 1.6 10*3/uL — ABNORMAL LOW (ref 1.7–7.7)
Neutrophils Relative %: 43 %
Platelet Count: 207 10*3/uL (ref 150–400)
RBC: 3.74 MIL/uL — ABNORMAL LOW (ref 3.87–5.11)
RDW: 13.2 % (ref 11.5–15.5)
WBC Count: 3.6 10*3/uL — ABNORMAL LOW (ref 4.0–10.5)
nRBC: 0 % (ref 0.0–0.2)

## 2019-04-13 MED ORDER — HEPARIN SOD (PORK) LOCK FLUSH 100 UNIT/ML IV SOLN
500.0000 [IU] | Freq: Once | INTRAVENOUS | Status: DC
  Filled 2019-04-13: qty 5

## 2019-04-13 MED ORDER — PROCHLORPERAZINE MALEATE 10 MG PO TABS
ORAL_TABLET | ORAL | Status: AC
  Filled 2019-04-13: qty 1

## 2019-04-13 MED ORDER — SODIUM CHLORIDE 0.9% FLUSH
10.0000 mL | Freq: Once | INTRAVENOUS | Status: AC
  Administered 2019-04-13: 10 mL
  Filled 2019-04-13: qty 10

## 2019-04-13 MED ORDER — SODIUM CHLORIDE 0.9 % IV SOLN
Freq: Once | INTRAVENOUS | Status: AC
  Filled 2019-04-13: qty 250

## 2019-04-13 MED ORDER — HEPARIN SOD (PORK) LOCK FLUSH 100 UNIT/ML IV SOLN
500.0000 [IU] | Freq: Once | INTRAVENOUS | Status: AC | PRN
  Administered 2019-04-13: 500 [IU]
  Filled 2019-04-13: qty 5

## 2019-04-13 MED ORDER — SODIUM CHLORIDE 0.9% FLUSH
10.0000 mL | INTRAVENOUS | Status: DC | PRN
  Administered 2019-04-13: 10 mL
  Filled 2019-04-13: qty 10

## 2019-04-13 MED ORDER — PACLITAXEL PROTEIN-BOUND CHEMO INJECTION 100 MG
100.0000 mg/m2 | Freq: Once | INTRAVENOUS | Status: AC
  Administered 2019-04-13: 175 mg via INTRAVENOUS
  Filled 2019-04-13: qty 35

## 2019-04-13 MED ORDER — PROCHLORPERAZINE MALEATE 10 MG PO TABS
10.0000 mg | ORAL_TABLET | Freq: Four times a day (QID) | ORAL | Status: DC | PRN
  Administered 2019-04-13: 15:00:00 10 mg via ORAL

## 2019-04-13 NOTE — Progress Notes (Signed)
CARDIO-ONCOLOGY CLINIC CONSULT NOTE  Referring Physician: Dr. 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 Burr Medico for enrollment into the Cardio-Oncology program.  SUMMARY OF ONCOLOGIC HISTORY:     Oncology History Overview Note   Cancer Staging Malignant neoplasm of upper-outer quadrant of right breast in female, estrogen receptor positive ([place]) Staging form: Breast, AJCC 8th Edition - Clinical stage from 12/29/2018: Stage IA (cT1b, cN0, cM0, G2, ER+, PR+, HER2+) - Unsigned - Pathologic stage from 01/27/2019: Stage IA (pT1c, pN0, cM0, G3, ER+, PR+, HER2+) - Signed by Truitt Merle, MD on 02/06/2019 Cancer Staging No matching staging information was found for the patient.    Malignant neoplasm of upper-outer quadrant of right breast in female, estrogen receptor positive ([place])   12/22/2018 Mammogram     Diagnostic Mammogram 12/22/18  IMPRESSION: 1. 9 mm mass, with associated calcifications and architectural distortion, in the 11 o'clock position of the right breast, highly suspicious for breast carcinoma. 2. Questionable mass noted in the inferior aspect of the right breast remains equivocal on diagnostic imaging may reflect focal fibroglandular tissue. There is no sonographic correlate.    12/23/2018 Initial Biopsy    Diagnosis 12/23/18 Breast, right, needle core biopsy, 11 o'clock position - INVASIVE DUCTAL CARCINOMA, SEE COMMENT. - DUCTAL CARCINOMA IN SITU WITH NECROSIS.    12/28/2018 Initial Diagnosis    Malignant neoplasm of upper-outer quadrant of right breast in female, estrogen receptor positive ([place])    01/13/2019 Imaging    MRI Breast 01/13/19  IMPRESSION: 1. Biopsy proven malignancy in the upper-outer right breast. No additional suspicious findings on the right. 2. No MRI evidence of malignancy on the left. 3. No suspicious lymphadenopathy.    01/27/2019 Surgery    RIGHT BREAST  LUMPECTOMY WITH RADIOACTIVE SEED AND SENTINEL LYMPH NODE MAPPING and PAC placement by Dr Brantley Stage  01/27/19    01/27/2019 Pathology Results    FINAL MICROSCOPIC DIAGNOSIS: 01/27/19  A. BREAST, RIGHT, LUMPECTOMY:  - Invasive ductal carcinoma, grade 3, spanning 1.3 cm.  - High grade ductal carcinoma in situ with necrosis.  - Biopsy site.  - Final resection margins are negative for carcinoma.  - See oncology table.   B. BREAST, RIGHT ADDITIONAL SUPERIOR MARGIN, EXCISION:  - Fibrocystic change and adenosis.  - No malignancy identified.   C. LYMPH NODE, RIGHT #1, SENTINEL, BIOPSY:  - One of one lymph nodes negative for carcinoma (0/1).   D. LYMPH NODE, RIGHT, SENTINEL, BIOPSY:  - One of one lymph nodes negative for carcinoma (0/1).   E. LYMPH NODE, RIGHT, SENTINEL, BIOPSY:  - One of one lymph nodes negative for carcinoma (0/1).   F. LYMPH NODE, RIGHT, SENTINEL, BIOPSY:  - One of one lymph nodes negative for carcinoma (0/1).        01/27/2019 Cancer Staging    Staging form: Breast, AJCC 8th Edition - Pathologic stage from 01/27/2019: Stage IA (pT1c, pN0, cM0, G3, ER+, PR+, HER2+) - Signed by Truitt Merle, MD on 02/06/2019    02/08/2019 Imaging    CT AP W Contrast 02/08/19  IMPRESSION: 1. Wall thickening of the gastric antrum may represent gastritis.  Aortic Atherosclerosis (ICD10-I70.0).    02/23/2019 -  Chemotherapy    Adjuvant weekly Abraxane with Herceptin q3weeks starting2/3/21      HPI: Denies any h/o heart disease. Had mild HL but failed lipitor due to increase in LFTs. Diagnosed with R breast  CA in 12/21. Now s/p lumpectomy in January. Adjuvant weekly Abraxane with Herceptin q3weeks starting2/3/21. Today is 8/12 doses tolerating ok.    ECHO: 12/20: EF 55-60% grade I DD  Echo today 04/13/19: EF 60-65% Personally reviewed  Review of Systems: [y] = yes, '[ ]'$  = no   General: Weight gain '[ ]'$ ; Weight loss '[ ]'$ ; Anorexia '[ ]'$ ; Fatigue '[ ]'$ ; Fever '[ ]'$ ; Chills '[ ]'$ ;  Weakness '[ ]'$   Cardiac: Chest pain/pressure '[ ]'$ ; Resting SOB '[ ]'$ ; Exertional SOB '[ ]'$ ; Orthopnea '[ ]'$ ; Pedal Edema Blue.Reese ]; Palpitations '[ ]'$ ; Syncope '[ ]'$ ; Presyncope '[ ]'$ ; Paroxysmal nocturnal dyspnea'[ ]'$   Pulmonary: Cough '[ ]'$ ; Wheezing'[ ]'$ ; Hemoptysis'[ ]'$ ; Sputum '[ ]'$ ; Snoring '[ ]'$   GI: Vomiting'[ ]'$ ; Dysphagia'[ ]'$ ; Melena'[ ]'$ ; Hematochezia '[ ]'$ ; Heartburn'[ ]'$ ; Abdominal pain '[ ]'$ ; Constipation '[ ]'$ ; Diarrhea '[ ]'$ ; BRBPR '[ ]'$   GU: Hematuria'[ ]'$ ; Dysuria '[ ]'$ ; Nocturia'[ ]'$   Vascular: Pain in legs with walking '[ ]'$ ; Pain in feet with lying flat '[ ]'$ ; Non-healing sores '[ ]'$ ; Stroke '[ ]'$ ; TIA '[ ]'$ ; Slurred speech '[ ]'$ ;  Neuro: Headaches'[ ]'$ ; Vertigo'[ ]'$ ; Seizures'[ ]'$ ; Paresthesias'[ ]'$ ;Blurred vision '[ ]'$ ; Diplopia '[ ]'$ ; Vision changes '[ ]'$   Ortho/Skin: Arthritis Blue.Reese ]; Joint pain [ y]; Muscle pain '[ ]'$ ; Joint swelling '[ ]'$ ; Back Pain '[ ]'$ ; Rash '[ ]'$   Psych: Depression'[ ]'$ ; Anxiety[ y]  Heme: Bleeding problems '[ ]'$ ; Clotting disorders '[ ]'$ ; Anemia '[ ]'$   Endocrine: Diabetes '[ ]'$ ; Thyroid dysfunction'[ ]'$    Past Medical History:  Diagnosis Date  . Cancer [facility])    breast cancer  . Hyperlipidemia     Current Outpatient Medications  Medication Sig Dispense Refill  . ALPRAZolam (XANAX) 0.25 MG tablet Take 0.25 mg by mouth 3 (three) times daily as needed.    Marland Kitchen CALCIUM PO Take 1,000 mg by mouth daily.    Marland Kitchen lidocaine-prilocaine (EMLA) cream Apply to affected area once 30 g 3  . Magnesium 125 MG CAPS Take 1 capsule by mouth daily.     . Melatonin 3 MG CAPS Take 3 mg by mouth at bedtime.     . Multiple Vitamins-Minerals (MULTIVITAMIN WITH MINERALS) tablet Take 1 tablet by mouth daily.    . pantoprazole (PROTONIX) 40 MG tablet Take 1 tablet (40 mg total) by mouth 2 (two) times daily. 112 tablet 0  . prochlorperazine (COMPAZINE) 10 MG tablet Take 1 tablet (10 mg total) by mouth every 6 (six) hours as needed (Nausea or vomiting). 30 tablet 1  . traZODone (DESYREL) 50 MG tablet Take 1-2 tablets (50-100 mg total) by mouth at bedtime as needed for sleep.  180 tablet 0  . VITAMIN D PO Take 12,000 Units by mouth daily.     No current facility-administered medications for this encounter.    No Known Allergies    Social History   Socioeconomic History  . Marital status: Married    Spouse name: Not on file  . Number of children: 2  . Years of education: Not on file  . Highest education level: Not on file  Occupational History  . Not on file  Tobacco Use  . Smoking status: Former Smoker    Packs/day: 0.25    Years: 5.00    Pack years: 1.25    Quit date: 01/20/1978    Years since quitting: 41.2  . Smokeless tobacco: Never Used  . Tobacco comment: 1982  Substance and Sexual Activity  . Alcohol use: Yes  Alcohol/week: 6.0 standard drinks    Types: 6 Glasses of wine per week  . Drug use: No  . Sexual activity: Yes    Birth control/protection: None  Other Topics Concern  . Not on file  Social History Narrative  . Not on file   Social Determinants of Health   Financial Resource Strain:   . Difficulty of Paying Living Expenses:   Food Insecurity:   . Worried About Charity fundraiser in the Last Year:   . Arboriculturist in the Last Year:   Transportation Needs:   . Film/video editor (Medical):   Marland Kitchen Lack of Transportation (Non-Medical):   Physical Activity:   . Days of Exercise per Week:   . Minutes of Exercise per Session:   Stress:   . Feeling of Stress :   Social Connections:   . Frequency of Communication with Friends and Family:   . Frequency of Social Gatherings with Friends and Family:   . Attends Religious Services:   . Active Member of Clubs or Organizations:   . Attends Archivist Meetings:   Marland Kitchen Marital Status:   Intimate Partner Violence:   . Fear of Current or Ex-Partner:   . Emotionally Abused:   Marland Kitchen Physically Abused:   . Sexually Abused:       Family History  Problem Relation Age of Onset  . Diabetes Mother   . Heart disease Mother   . Hyperlipidemia Mother   . Breast cancer  Mother 58  . Cancer Mother        breast DCIS  . Cancer Father 1       prostate cancer   . Diabetes Father   . Breast cancer Sister 59  . Cancer Sister 63       breast cancer   . Cancer Maternal Aunt 55       breast cancer   . Cancer Maternal Grandfather        colon cancer  . Colon cancer Maternal Grandfather   . Esophageal cancer Neg Hx   . Rectal cancer Neg Hx   . Stomach cancer Neg Hx     Vitals:   04/13/19 1117  BP: 130/66  Pulse: 73  SpO2: 100%  Weight: 80.6 kg (177 lb 12.8 oz)    PHYSICAL EXAM: General:  Well appearing. No respiratory difficulty HEENT: normal Neck: supple. no JVD. Carotids 2+ bilat; no bruits. No lymphadenopathy or thryomegaly appreciated. Cor: PMI nondisplaced. Regular rate & rhythm. No rubs, gallops or murmurs. + port Lungs: clear Abdomen: soft, nontender, nondistended. No hepatosplenomegaly. No bruits or masses. Good bowel sounds. Extremities: no cyanosis, clubbing, rash, trace edema Neuro: alert & oriented x 3, cranial nerves grossly intact. moves all 4 extremities w/o difficulty. Affect pleasant.  ECG: NSR 76 nos specific t wave flattening Personally reviewed    ASSESSMENT & PLAN:  1. Right Breast Cancer - diagnosed 12/20 - s/p lumpectomy - Explained incidence of Herceptin cardiotoxicity and role of Cardio-oncology clinic at length. Echo images reviewed personally. All parameters stable. Reviewed signs and symptoms of HF to look for. Continue Herceptin. Follow-up with echo in 3 months.  2. Mild LE edema - can add low-dose spironolactone as needed  Glori Bickers, MD  12:06 PM

## 2019-04-13 NOTE — Progress Notes (Signed)
  Echocardiogram 2D Echocardiogram has been performed.  Alicia Hahn 04/13/2019, 10:59 AM

## 2019-04-13 NOTE — Patient Instructions (Signed)
[facility] Discharge Instructions for Patients Receiving Chemotherapy  Today you received the following chemotherapy agents:  Abraxane.  To help prevent nausea and vomiting after your treatment, we encourage you to take your nausea medication as directed.   If you develop nausea and vomiting that is not controlled by your nausea medication, call the clinic.   BELOW ARE SYMPTOMS THAT SHOULD BE REPORTED IMMEDIATELY:  *FEVER GREATER THAN 100.5 F  *CHILLS WITH OR WITHOUT FEVER  NAUSEA AND VOMITING THAT IS NOT CONTROLLED WITH YOUR NAUSEA MEDICATION  *UNUSUAL SHORTNESS OF BREATH  *UNUSUAL BRUISING OR BLEEDING  TENDERNESS IN MOUTH AND THROAT WITH OR WITHOUT PRESENCE OF ULCERS  *URINARY PROBLEMS  *BOWEL PROBLEMS  UNUSUAL RASH Items with * indicate a potential emergency and should be followed up as soon as possible.  Feel free to call the clinic should you have any questions or concerns. The clinic phone number is [phone].  Please show the CHEMO ALERT CARD at check-in to the Emergency Department and triage nurse.   

## 2019-04-13 NOTE — Progress Notes (Signed)
Pt's ALT is 116 today - ok to treat per Lanora Manis NP

## 2019-04-13 NOTE — Patient Instructions (Signed)
Your physician recommends that you schedule a follow-up appointment in: 3 months with echocardiogram  

## 2019-04-15 NOTE — Progress Notes (Signed)
[place]   [phone] [phone]   Clinic Follow up Note   Patient Care Team: Mellody Dance, DO as PCP - General (Family Medicine) Mcarthur Rossetti, MD as Consulting Physician (Orthopedic Surgery) Molli Posey, MD as Consulting Physician (Obstetrics and Gynecology) Erroll Luna, MD as Consulting Physician (General Surgery) Truitt Merle, MD as Consulting Physician (Hematology) Kyung Rudd, MD as Consulting Physician (Radiation Oncology) Mauro Kaufmann, RN as Oncology Nurse Navigator Rockwell Germany, RN as Oncology Nurse Navigator  Date of Service:  04/27/2019  CHIEF COMPLAINT: Follow-up right breast cancer  SUMMARY OF ONCOLOGIC HISTORY: Oncology History Overview Note  Cancer Staging Malignant neoplasm of upper-outer quadrant of right breast in female, estrogen receptor positive ([place]) Staging form: Breast, AJCC 8th Edition - Clinical stage from 12/29/2018: Stage IA (cT1b, cN0, cM0, G2, ER+, PR+, HER2+) - Unsigned - Pathologic stage from 01/27/2019: Stage IA (pT1c, pN0, cM0, G3, ER+, PR+, HER2+) - Signed by Truitt Merle, MD on 02/06/2019 Cancer Staging No matching staging information was found for the patient.    Malignant neoplasm of upper-outer quadrant of right breast in female, estrogen receptor positive ([place])  12/22/2018 Mammogram    Diagnostic Mammogram 12/22/18  IMPRESSION: 1. 9 mm mass, with associated calcifications and architectural distortion, in the 11 o'clock position of the right breast, highly suspicious for breast carcinoma. 2. Questionable mass noted in the inferior aspect of the right breast remains equivocal on diagnostic imaging may reflect focal fibroglandular tissue. There is no sonographic correlate.   12/23/2018 Initial Biopsy   Diagnosis 12/23/18 Breast, right, needle core biopsy, 11 o'clock position - INVASIVE DUCTAL CARCINOMA, SEE COMMENT. - DUCTAL CARCINOMA IN SITU WITH NECROSIS.   12/28/2018 Initial  Diagnosis   Malignant neoplasm of upper-outer quadrant of right breast in female, estrogen receptor positive ([place])   01/13/2019 Imaging   MRI Breast 01/13/19  IMPRESSION: 1. Biopsy proven malignancy in the upper-outer right breast. No additional suspicious findings on the right. 2. No MRI evidence of malignancy on the left. 3. No suspicious lymphadenopathy.   01/27/2019 Surgery   RIGHT BREAST LUMPECTOMY WITH RADIOACTIVE SEED AND SENTINEL LYMPH NODE MAPPING and PAC placement by Dr Brantley Stage  01/27/19   01/27/2019 Pathology Results   FINAL MICROSCOPIC DIAGNOSIS: 01/27/19  A. BREAST, RIGHT, LUMPECTOMY:  - Invasive ductal carcinoma, grade 3, spanning 1.3 cm.  - High grade ductal carcinoma in situ with necrosis.  - Biopsy site.  - Final resection margins are negative for carcinoma.  - See oncology table.   B. BREAST, RIGHT ADDITIONAL SUPERIOR MARGIN, EXCISION:  - Fibrocystic change and adenosis.  - No malignancy identified.   C. LYMPH NODE, RIGHT #1, SENTINEL, BIOPSY:  - One of one lymph nodes negative for carcinoma (0/1).   D. LYMPH NODE, RIGHT, SENTINEL, BIOPSY:  - One of one lymph nodes negative for carcinoma (0/1).   E. LYMPH NODE, RIGHT, SENTINEL, BIOPSY:  - One of one lymph nodes negative for carcinoma (0/1).   F. LYMPH NODE, RIGHT, SENTINEL, BIOPSY:  - One of one lymph nodes negative for carcinoma (0/1).       01/27/2019 Cancer Staging   Staging form: Breast, AJCC 8th Edition - Pathologic stage from 01/27/2019: Stage IA (pT1c, pN0, cM0, G3, ER+, PR+, HER2+) - Signed by Truitt Merle, MD on 02/06/2019   02/08/2019 Imaging   CT AP W Contrast 02/08/19  IMPRESSION: 1. Wall thickening of the gastric antrum may represent gastritis.   Aortic Atherosclerosis (ICD10-I70.0).   02/23/2019 -  Chemotherapy   Adjuvant weekly Abraxane with Herceptin q3weeks starting 02/23/19      CURRENT THERAPY:  Adjuvant weekly Abraxane with Herceptin q3weeks starting2/3/21  INTERVAL HISTORY:  Alicia Hahn is here for a follow up and treatment. She presents to the clinic alone. She notes she is doing well. She notes she is getting more tired with treatment. She can still do her daily routine but can feel it more and can get out of breath. She will take breaks as needed. She notes no new neuropathy progression. She notes her ribcage and thigh pain is only mild now. She notes eating is adequate. She will have mild "queasiness". She also notes she has gained weigh and feels puffy. She plans to have an endoscopy in May.     REVIEW OF SYSTEMS:   Constitutional: Denies fevers, chills or abnormal weight loss (+) Fatigue  Eyes: Denies blurriness of vision Ears, nose, mouth, throat, and face: Denies mucositis or sore throat Respiratory: Denies cough, dyspnea or wheezes Cardiovascular: Denies palpitation, chest discomfort   Gastrointestinal:  Denies nausea, heartburn or change in bowel habits (+) mild "queasiness".   Skin: Denies abnormal skin rashes Lymphatics: (+) stable chronic neuropathy of 1 finger.  MSK: (+) Mild pain of b/l upper legs and b/l ribcage at bottom or breasts, improved.  Neurological:Denies numbness, tingling or new weaknesses Behavioral/Psych: Mood is stable, no new changes  All other systems were reviewed with the patient and are negative.  MEDICAL HISTORY:  Past Medical History:  Diagnosis Date  . Cancer [facility])    breast cancer  . Hyperlipidemia     SURGICAL HISTORY: Past Surgical History:  Procedure Laterality Date  . BREAST LUMPECTOMY WITH RADIOACTIVE SEED AND SENTINEL LYMPH NODE BIOPSY Right 01/27/2019   Procedure: RIGHT BREAST LUMPECTOMY WITH RADIOACTIVE SEED AND SENTINEL LYMPH NODE MAPPING;  Surgeon: Erroll Luna, MD;  Location: [place];  Service: General;  Laterality: Right;  . COSMETIC SURGERY Bilateral 2000   breast aug/ lipo  . PORTACATH PLACEMENT Right 01/27/2019   Procedure: INSERTION PORT-A-CATH WITH ULTRASOUND;  Surgeon: Erroll Luna, MD;  Location: [place];  Service: General;  Laterality: Right;    I have reviewed the social history and family history with the patient and they are unchanged from previous note.  ALLERGIES:  has No Known Allergies.  MEDICATIONS:  Current Outpatient Medications  Medication Sig Dispense Refill  . CALCIUM PO Take 1,000 mg by mouth daily.    Marland Kitchen lidocaine-prilocaine (EMLA) cream Apply to affected area once 30 g 3  . Magnesium 125 MG CAPS Take 1 capsule by mouth daily.     . Melatonin 3 MG CAPS Take 3 mg by mouth at bedtime.     . Multiple Vitamins-Minerals (MULTIVITAMIN WITH MINERALS) tablet Take 1 tablet by mouth daily.    . pantoprazole (PROTONIX) 40 MG tablet Take 1 tablet (40 mg total) by mouth 2 (two) times daily. 112 tablet 0  . prochlorperazine (COMPAZINE) 10 MG tablet Take 1 tablet (10 mg total) by mouth every 6 (six) hours as needed (Nausea or vomiting). 30 tablet 1  . traZODone (DESYREL) 50 MG tablet Take 1 tablet (50 mg total) by mouth at bedtime as needed for sleep. 90 tablet 1  . VITAMIN D PO Take 12,000 Units by mouth daily.     No current facility-administered medications for this visit.    PHYSICAL EXAMINATION: ECOG PERFORMANCE STATUS: 2 - Symptomatic, <50% confined to bed  Vitals:  04/27/19 0910  BP: 126/76  Pulse: 78  Resp: 18  Temp: 98 F (36.7 C)  SpO2: 100%   Filed Weights   04/27/19 0910  Weight: 180 lb 9.6 oz (81.9 kg)    GENERAL:alert, no distress and comfortable SKIN: skin color, texture, turgor are normal, no rashes or significant lesions EYES: normal, Conjunctiva are pink and non-injected, sclera clear  NECK: supple, thyroid normal size, non-tender, without nodularity LYMPH:  no palpable lymphadenopathy in the cervical, axillary  LUNGS: clear to auscultation and percussion with normal breathing effort HEART: regular rate & rhythm and no murmurs, no lower extremity edema ABDOMEN:abdomen soft, non-tender and normal bowel  sounds Musculoskeletal:no cyanosis of digits and no clubbing  NEURO: alert & oriented x 3 with fluent speech, no focal motor/sensory deficits  LABORATORY DATA:  I have reviewed the data as listed CBC Latest Ref Rng & Units 04/27/2019 04/20/2019 04/13/2019  WBC 4.0 - 10.5 K/uL 3.7(L) 4.5 3.6(L)  Hemoglobin 12.0 - 15.0 g/dL 11.2(L) 11.4(L) 11.6(L)  Hematocrit 36.0 - 46.0 % 32.9(L) 33.5(L) 33.8(L)  Platelets 150 - 400 K/uL 213 225 207     CMP Latest Ref Rng & Units 04/27/2019 04/20/2019 04/13/2019  Glucose 70 - 99 mg/dL 108(H) 115(H) 98  BUN 6 - 20 mg/dL 15 23(H) 16  Creatinine 0.44 - 1.00 mg/dL 0.79 0.86 0.89  Sodium 135 - 145 mmol/L 140 141 141  Potassium 3.5 - 5.1 mmol/L 4.0 3.8 4.0  Chloride 98 - 111 mmol/L 110 107 109  CO2 22 - 32 mmol/L _0 Calcium 8.9 - 10.3 mg/dL 8.8(L) 8.7(L) 8.9  Total Protein 6.5 - 8.1 g/dL 5.8(L) 6.1(L) 6.3(L)  Total Bilirubin 0.3 - 1.2 mg/dL 0.3 0.2(L) 0.3  Alkaline Phos 38 - 126 U/L 76 77 82  AST 15 - 41 U/L 45(H) 57(H) 51(H)  ALT 0 - 44 U/L 119(H) 147(H) 116(H)      RADIOGRAPHIC STUDIES: I have personally reviewed the radiological images as listed and agreed with the findings in the report. No results found.   ASSESSMENT & PLAN:  Nakina Spatz is a 59 y.o. female with     1.Malignant neoplasm of upper-outer quadrant of right breast,invasive ductal carcinoma,pT1cN0M0,stageIA,Triple Positive,Grade3 -She was recently diagnosed in 12/2018.She has 92m mass of right breast with invasive ductal carcinoma and components of DCIS.She underwent right lumpectomy and SLNB on 01/27/19 by Dr CBrantley Stage  -Given the size of her tumor and HER2 positive disease she has higher risk of recurrence. Istarted her onadjuvant chemotherapy with Weekly Abraxane and Herceptin q3weeks for 12 weeksbeginning2/3/21. Plan to continue maintenance Herceptin to complete 1 year of treatment.  -After chemo she will also proceed with adjuvant Radiation and  antiestrogen therapy to further reduce her risk of local and distant recurrence. -S/p week 9 chemo, she continues to tolerate Abraxane and Herceptin moderately well with increased fatigue.  -Labs reviewed with Hg 11.2. Overall adequate to proceed with Abraxane and Herceptin today.  -Due to her persistent transaminitis, moderate fatigue, I will reduce her Abraxane from 100 mg/m2 to 80 mg/m -She opted change Herceptin to Injections after she completes chemo. I will see if her insurance approves.  -F/u in 2 weeks with last dose Abraxane   2. Gastric ulcers  -After surgery she took Motrin and started having severe epigastric pain and required ED visit.  -Her 02/25/19 Endoscopy with Dr. BTarri Glennshowed 5 small gastric ulcers, benign. She is currently on Protonix and will continues until she completes chemo. Will repeat  endoscopy with Dr. Tarri Glenn in May, 2021.She can request colonoscopy to be done same day. -Pain is manageable on ProtonixBID.   3. Estrogen replacement andpostmenopausal vaginalbleeding  -Started after 2nd Mirena was taken out. Her Gyn put her on a third Mirena and bleeding stopped.  Vernia Buff estrogen replacement for several years for hot flashes. By her GYN. -Given her age she is likely postmenopausal and her given ER/PR positive breast cancer Ipreviouslyrecommendedshe take her Mirena out. If she has further vaginal bleeding she should undergo workup for this with her Gyn.    4. Genetics  -Based on family history her father has prostate cancer and she had significant family history of breast cancer. She is eligible for genetic testing.  -Pt notes she genetic testing before at [facility]. Will obtain records.   5. Transaminitis -Likely related to chemo, also possible related to Lipitor which started a few months ago -Her LFTs did improve after stopping lipitor. -Follow-up lab weekly.  PLAN: -Lab reviewed and adequate to proceedwith Herceptin  andweek10Abraxane today, will reduce Abraxane to 80 mg/m -Continue labs and weeklyAbraxaneand Herceptin every 3 weeks -F/u in 2 weeks with last dose chemo, will refer her to rad/onc on next appointment  -will change Herceptin to injection in 3 weeks, order placed for insurance approval today    No problem-specific Assessment & Plan notes found for this encounter.   No orders of the defined types were placed in this encounter.  All questions were answered. The patient knows to call the clinic with any problems, questions or concerns. No barriers to learning was detected. The total time spent in the appointment was 30 minutes.     Truitt Merle, MD 04/27/2019   I, Joslyn Devon, am acting as scribe for Truitt Merle, MD.   I have reviewed the above documentation for accuracy and completeness, and I agree with the above.

## 2019-04-20 ENCOUNTER — Other Ambulatory Visit: Payer: Self-pay

## 2019-04-20 ENCOUNTER — Inpatient Hospital Stay: Payer: 59

## 2019-04-20 ENCOUNTER — Ambulatory Visit: Payer: 59

## 2019-04-20 ENCOUNTER — Other Ambulatory Visit: Payer: 59

## 2019-04-20 VITALS — BP 133/87 | HR 77 | Temp 98.7°F | Resp 18

## 2019-04-20 DIAGNOSIS — C50411 Malignant neoplasm of upper-outer quadrant of right female breast: Secondary | ICD-10-CM

## 2019-04-20 DIAGNOSIS — Z170 Estrogen receptor positive status [ER+]: Secondary | ICD-10-CM

## 2019-04-20 DIAGNOSIS — Z95828 Presence of other vascular implants and grafts: Secondary | ICD-10-CM

## 2019-04-20 DIAGNOSIS — Z5111 Encounter for antineoplastic chemotherapy: Secondary | ICD-10-CM | POA: Diagnosis not present

## 2019-04-20 LAB — CBC WITH DIFFERENTIAL (CANCER CENTER ONLY)
Abs Immature Granulocytes: 0.12 10*3/uL — ABNORMAL HIGH (ref 0.00–0.07)
Basophils Absolute: 0 10*3/uL (ref 0.0–0.1)
Basophils Relative: 1 %
Eosinophils Absolute: 0.1 10*3/uL (ref 0.0–0.5)
Eosinophils Relative: 2 %
HCT: 33.5 % — ABNORMAL LOW (ref 36.0–46.0)
Hemoglobin: 11.4 g/dL — ABNORMAL LOW (ref 12.0–15.0)
Immature Granulocytes: 3 %
Lymphocytes Relative: 36 %
Lymphs Abs: 1.6 10*3/uL (ref 0.7–4.0)
MCH: 30.9 pg (ref 26.0–34.0)
MCHC: 34 g/dL (ref 30.0–36.0)
MCV: 90.8 fL (ref 80.0–100.0)
Monocytes Absolute: 0.4 10*3/uL (ref 0.1–1.0)
Monocytes Relative: 8 %
Neutro Abs: 2.3 10*3/uL (ref 1.7–7.7)
Neutrophils Relative %: 50 %
Platelet Count: 225 10*3/uL (ref 150–400)
RBC: 3.69 MIL/uL — ABNORMAL LOW (ref 3.87–5.11)
RDW: 13.5 % (ref 11.5–15.5)
WBC Count: 4.5 10*3/uL (ref 4.0–10.5)
nRBC: 0.4 % — ABNORMAL HIGH (ref 0.0–0.2)

## 2019-04-20 LAB — CMP (CANCER CENTER ONLY)
ALT: 147 U/L — ABNORMAL HIGH (ref 0–44)
AST: 57 U/L — ABNORMAL HIGH (ref 15–41)
Albumin: 3.5 g/dL (ref 3.5–5.0)
Alkaline Phosphatase: 77 U/L (ref 38–126)
Anion gap: 9 (ref 5–15)
BUN: 23 mg/dL — ABNORMAL HIGH (ref 6–20)
CO2: 25 mmol/L (ref 22–32)
Calcium: 8.7 mg/dL — ABNORMAL LOW (ref 8.9–10.3)
Chloride: 107 mmol/L (ref 98–111)
Creatinine: 0.86 mg/dL (ref 0.44–1.00)
GFR, Est AFR Am: >60 mL/min (ref >60)
GFR, Estimated: >60 mL/min (ref >60)
Glucose, Bld: 115 mg/dL — ABNORMAL HIGH (ref 70–99)
Potassium: 3.8 mmol/L (ref 3.5–5.1)
Sodium: 141 mmol/L (ref 135–145)
Total Bilirubin: 0.2 mg/dL — ABNORMAL LOW (ref 0.3–1.2)
Total Protein: 6.1 g/dL — ABNORMAL LOW (ref 6.5–8.1)

## 2019-04-20 MED ORDER — PACLITAXEL PROTEIN-BOUND CHEMO INJECTION 100 MG
100.0000 mg/m2 | Freq: Once | INTRAVENOUS | Status: AC
  Administered 2019-04-20: 175 mg via INTRAVENOUS
  Filled 2019-04-20: qty 35

## 2019-04-20 MED ORDER — PROCHLORPERAZINE MALEATE 10 MG PO TABS
10.0000 mg | ORAL_TABLET | Freq: Four times a day (QID) | ORAL | Status: DC | PRN
  Administered 2019-04-20: 10 mg via ORAL

## 2019-04-20 MED ORDER — HEPARIN SOD (PORK) LOCK FLUSH 100 UNIT/ML IV SOLN
500.0000 [IU] | Freq: Once | INTRAVENOUS | Status: DC | PRN
  Filled 2019-04-20: qty 5

## 2019-04-20 MED ORDER — SODIUM CHLORIDE 0.9% FLUSH
10.0000 mL | Freq: Once | INTRAVENOUS | Status: AC
  Administered 2019-04-20: 10 mL
  Filled 2019-04-20: qty 10

## 2019-04-20 MED ORDER — PROCHLORPERAZINE MALEATE 10 MG PO TABS
ORAL_TABLET | ORAL | Status: AC
  Filled 2019-04-20: qty 1

## 2019-04-20 MED ORDER — SODIUM CHLORIDE 0.9 % IV SOLN
Freq: Once | INTRAVENOUS | Status: AC
  Filled 2019-04-20: qty 250

## 2019-04-20 MED ORDER — SODIUM CHLORIDE 0.9% FLUSH
10.0000 mL | INTRAVENOUS | Status: DC | PRN
  Filled 2019-04-20: qty 10

## 2019-04-20 NOTE — Patient Instructions (Signed)
[facility] Discharge Instructions for Patients Receiving Chemotherapy  Today you received the following chemotherapy agents:  Abraxane.  To help prevent nausea and vomiting after your treatment, we encourage you to take your nausea medication as directed.   If you develop nausea and vomiting that is not controlled by your nausea medication, call the clinic.   BELOW ARE SYMPTOMS THAT SHOULD BE REPORTED IMMEDIATELY:  *FEVER GREATER THAN 100.5 F  *CHILLS WITH OR WITHOUT FEVER  NAUSEA AND VOMITING THAT IS NOT CONTROLLED WITH YOUR NAUSEA MEDICATION  *UNUSUAL SHORTNESS OF BREATH  *UNUSUAL BRUISING OR BLEEDING  TENDERNESS IN MOUTH AND THROAT WITH OR WITHOUT PRESENCE OF ULCERS  *URINARY PROBLEMS  *BOWEL PROBLEMS  UNUSUAL RASH Items with * indicate a potential emergency and should be followed up as soon as possible.  Feel free to call the clinic should you have any questions or concerns. The clinic phone number is [phone].  Please show the CHEMO ALERT CARD at check-in to the Emergency Department and triage nurse.   

## 2019-04-20 NOTE — Progress Notes (Signed)
ALT 147 today. Okay to treat, per Dr. Alen Blew.

## 2019-04-21 NOTE — Progress Notes (Signed)

## 2019-04-25 ENCOUNTER — Encounter: Payer: Self-pay | Admitting: Family Medicine

## 2019-04-25 ENCOUNTER — Ambulatory Visit (INDEPENDENT_AMBULATORY_CARE_PROVIDER_SITE_OTHER): Payer: 59 | Admitting: Family Medicine

## 2019-04-25 ENCOUNTER — Other Ambulatory Visit: Payer: Self-pay

## 2019-04-25 VITALS — BP 124/83 | HR 78 | Temp 97.3°F | Ht 63.0 in | Wt 177.0 lb

## 2019-04-25 DIAGNOSIS — E785 Hyperlipidemia, unspecified: Secondary | ICD-10-CM

## 2019-04-25 DIAGNOSIS — R748 Abnormal levels of other serum enzymes: Secondary | ICD-10-CM

## 2019-04-25 DIAGNOSIS — Z7969 Long term (current) use of other immunomodulators and immunosuppressants: Secondary | ICD-10-CM

## 2019-04-25 DIAGNOSIS — Z803 Family history of malignant neoplasm of breast: Secondary | ICD-10-CM | POA: Diagnosis not present

## 2019-04-25 DIAGNOSIS — F5104 Psychophysiologic insomnia: Secondary | ICD-10-CM

## 2019-04-25 DIAGNOSIS — C50411 Malignant neoplasm of upper-outer quadrant of right female breast: Secondary | ICD-10-CM | POA: Diagnosis not present

## 2019-04-25 DIAGNOSIS — Z79899 Other long term (current) drug therapy: Secondary | ICD-10-CM

## 2019-04-25 DIAGNOSIS — Z170 Estrogen receptor positive status [ER+]: Secondary | ICD-10-CM

## 2019-04-25 DIAGNOSIS — F4329 Adjustment disorder with other symptoms: Secondary | ICD-10-CM

## 2019-04-25 MED ORDER — TRAZODONE HCL 50 MG PO TABS: 50.0000 mg | ORAL_TABLET | Freq: Every evening | ORAL | 1 refills | Status: DC | PRN

## 2019-04-25 NOTE — Progress Notes (Signed)
Virtual office visit note for [employer], D.O- Primary Care Physician at [facility] I connected with current patient today and verified that I am speaking with the correct person   . Location of the patient: Home . Location of the provider: Office - This visit type was conducted due to national recommendations for restrictions regarding the COVID-19 Pandemic (e.g. social distancing) in an effort to limit this patient's exposure and mitigate transmission in our community.    - No physical exam could be performed with this format, beyond that communicated to [place] by the patient/ family members as noted.   - Additionally my office staff/ schedulers were to discuss with the patient that there may be a monetary charge related to this service, depending on their medical insurance.  My understanding is that patient understood and consented to proceed.   ____________________________________________________________________________    History of Present Illness:  I, Toni Amend, am serving as Education administrator for [employer].  Notes at present, she has 3 more weeks of chemo, and then 6 weeks of radiation.  - Psychophysiological Insomnia, Stress & Adjustment Reaction States that the trazodone "helps so much, it has made a huge difference, I sleep so much better now."  She feels it has helped with the breast cancer, treatments, etc.  "I feel a lot better."  Says "I think I just have a better overall attitude about the whole treatment, but that may be because I'm already in treatment and past that anxiety of just waiting for it to start."  Her energy levels have been fair.  Notes "I have moderate fatigue, some days it's mild."  She's still able to get everything done that she needs to do."  Notes on the chemo, she has "mild pain, but it's not every day."  But other than that, notes she hasn't really had many other side-effects.  - Blood Pressure Regarding her blood pressure, says "it's been  okay; some weeks it's a little elevated."  They check it every week when she goes to receive chemo.  Says "I've gained about 10 lbs just since starting treatment."  She was told that she has some fluid build up and may be able to benefit from a low dose of spironolactone in the future, but the oncologist didn't want to start that just yet.  In addition, remarks "I need to lose weight in general."  Overall, feels her blood pressure is well-controlled, usually in the 130's.  - Exercise She tries to walk on the treadmill most days.  - Elevated Liver Enzymes on Lipitor and Chemotherapy She started Lipitor and notes when she went for her first chemo one week later, her liver enzymes were elevated.  Says they checked her liver enzymes again the second week, and her liver enzymes were doubled.  They discontinued Lipitor, and after this, her liver enzymes reduced by half, and "since then, they've occasionally been elevated, like this last week, they were elevated again."  If her enzymes continue to be elevated, she may need to cut her chemo dose back, but they have not done this yet.  Believes she discontinued Lipitor around the 10th of February.     Depression screen [facility] 2/9 04/25/2019 02/02/2019 11/02/2018  Decreased Interest 0 0 0  Down, Depressed, Hopeless 0 0 0  PHQ - 2 Score 0 0 0  Altered sleeping 1 2 1   Tired, decreased energy 3 1 1   Change in appetite 0 1 1  Feeling bad or  failure about yourself  0 0 0  Trouble concentrating 0 0 0  Moving slowly or fidgety/restless 0 0 0  Suicidal thoughts 0 0 0  PHQ-9 Score 4 4 3   Difficult doing work/chores Not difficult at all Not difficult at all Not difficult at all    No flowsheet data found.    Impression and Recommendations:    1. Malignant neoplasm of upper-outer quadrant of right breast in female, estrogen receptor positive ([place])   2. Family history of breast cancer in sister(age33) and mother   3. Chemoprophylaxis- chemo likley causing  inc ALT/AST   4. Hyperlipidemia, unspecified hyperlipidemia type   5. Elevated liver enzymes   6. Psychophysiological insomnia   7. Stress and adjustment reaction     Malignant Neoplasm of Upper-Outer Quadrant of R Breast in Female, ER+ Family Hx Breast Cancer in Sister (age 16) and Mother - Stable at this time on current management. - Patient will continue management through oncology / care team. - Per patient, tolerating treatment plan well and reports low incidence of side-effects.  - Will continue to monitor alongside specialists.   Psychophysiological Insomnia, Stress & Adjustment Reaction - Stable at this time on current management. - Patient tolerating trazodone well with relief of symptoms. - Continue treatment plan as established.  See med list.  - Will continue to monitor.   Hyperlipidemia, Elevated Liver Enzymes, Chemoprophylaxis Chemo possibly causing incr ALT & AST - Patient discontinued Lipitor around March 02, 2019, due to elevated liver enzymes while managed on concurrent chemotherapy treatment.  - Advised patient to abide by the recommendations of oncology regarding statin maintenance.  - Last FLP obtained 02/02/2019: Triglycerides = 189, elevated. HDL = 58, WNL. LDL = 157, elevated.  - In future, a month after patient's chemotherapy is finished, will consider resuming a small dose of Lipitor in the future 2-3 times per week.  - If desired, patient knows that she may also consult with cardiology regarding cholesterol maintenance.  - Will continue to monitor alongside specialists.   Recommendations - Return in 2 months to re-check FLP, with OV/tele-health to review labs one week later.    - As part of my medical decision making, I reviewed the following data within the [place] History obtained from pt /family, CMA notes reviewed and incorporated if applicable, Labs reviewed, Radiograph/ tests reviewed if applicable and OV notes from  prior OV's with me, as well as other specialists she/he has seen since seeing me last, were all reviewed and used in my medical decision making process today.   - Additionally, when appropriate, discussion had with patient regarding our treatment plan, and their biases/concerns about that plan were used in my medical decision making today.   - The patient agreed with the plan and demonstrated an understanding of the instructions.   No barriers to understanding were identified.  - The patient was advised to call back or seek an in-person evaluation if the symptoms worsen or if the condition fails to improve as anticipated.   Recommendations and follow-up: Return for f/up 2 months with FLP, OV or tele-health to review one week later.    Meds ordered this encounter  Medications  . traZODone (DESYREL) 50 MG tablet    Sig: Take 1 tablet (50 mg total) by mouth at bedtime as needed for sleep.    Dispense:  90 tablet    Refill:  1    Do not send RF requests to Dr Raliegh Scarlet in future as  I will be leaving the practice April 30th 2021.  It will have to come from another provider at [facility] in future.    Medications Discontinued During This Encounter  Medication Reason  . ALPRAZolam (XANAX) 0.25 MG tablet No longer needed (for PRN medications)  . traZODone (DESYREL) 50 MG tablet Reorder      Note:  This note was prepared with assistance of Systems analyst. Occasional wrong-word or sound-a-like substitutions may have occurred due to the inherent limitations of voice recognition software.  The [place] was signed into law in 2016 which includes the topic of electronic health records.  This provides immediate access to information in MyChart.  This includes consultation notes, operative notes, office notes, lab results and pathology reports.  If you have any questions about what you read please let us know at your next visit or call us at the office.  We  are right here with you.  This document serves as a record of services personally performed by Mellody Dance, DO. It was created on her behalf by Toni Amend, a trained medical scribe. The creation of this record is based on the scribe's personal observations and the provider's statements to them.    The above documentation from Toni Amend, medical scribe, has been reviewed by Marjory Sneddon, D.O.    ________________________________________________________________________________________  Patient Care Team    Relationship Specialty Notifications Start End  Mellody Dance, DO PCP - General Family Medicine  11/02/18   Mcarthur Rossetti, [place] Physician Orthopedic Surgery  11/02/18   Molli Posey, MD Consulting Physician Obstetrics and Gynecology  11/02/18   Erroll Luna, MD Consulting Physician General Surgery  12/27/18   Truitt Merle, [place] Physician Hematology  12/27/18   Kyung Rudd, MD Consulting Physician Radiation Oncology  12/27/18   Mauro Kaufmann, RN Oncology Nurse Navigator   12/28/18   Rockwell Germany, RN Oncology Nurse Navigator   12/28/18     -Vitals obtained; medications/ allergies reconciled;  personal medical, social, Sx etc.histories were updated by CMA, reviewed by me and are reflected in chart  Patient Active Problem List   Diagnosis Date Noted  . Malignant neoplasm of upper-outer quadrant of right breast in female, estrogen receptor positive ([place]) 12/28/2018  . Family history of breast cancer in sister(age33) and mother 11/02/2018  . Plantar fasciitis of left foot 11/02/2018  . Elevated liver enzymes 04/25/2019  . Hyperlipidemia 04/25/2019  . Chemoprophylaxis 04/25/2019  . Port-A-Cath in place 03/30/2019  . Cervical radiculopathy at C7 on L  02/02/2019  . Psychophysiological insomnia 02/02/2019  . Numbness and tingling of hand-  C7 distribution L hand 02/02/2019  . Stress and adjustment reaction 02/02/2019  .  Genetic testing 12/30/2018  . Malignant tumor of breast ([place]) 12/22/2018  . Muscle ache of extremity-bilateral calf muscles 11/02/2018  . Impingement syndrome of right shoulder 05/11/2017     Current Meds  Medication Sig  . CALCIUM PO Take 1,000 mg by mouth daily.  Marland Kitchen lidocaine-prilocaine (EMLA) cream Apply to affected area once  . Magnesium 125 MG CAPS Take 1 capsule by mouth daily.   . Melatonin 3 MG CAPS Take 3 mg by mouth at bedtime.   . Multiple Vitamins-Minerals (MULTIVITAMIN WITH MINERALS) tablet Take 1 tablet by mouth daily.  . pantoprazole (PROTONIX) 40 MG tablet Take 1 tablet (40 mg total) by mouth 2 (two) times daily.  . prochlorperazine (COMPAZINE) 10 MG tablet Take 1 tablet (10 mg  total) by mouth every 6 (six) hours as needed (Nausea or vomiting).  . traZODone (DESYREL) 50 MG tablet Take 1 tablet (50 mg total) by mouth at bedtime as needed for sleep.  Marland Kitchen VITAMIN D PO Take 12,000 Units by mouth daily.  . [DISCONTINUED] traZODone (DESYREL) 50 MG tablet Take 1-2 tablets (50-100 mg total) by mouth at bedtime as needed for sleep.     No Known Allergies   ROS:  See above HPI for pertinent positives and negatives   Objective:   Blood pressure 124/83, pulse 78, temperature (!) 97.3 F (36.3 C), temperature source Oral, height 5\' 3"  (1.6 m), weight 177 lb (80.3 kg).  (if some vitals are omitted, this means that patient was UNABLE to obtain them even though they were asked to get them prior to OV today.  They were asked to call us at their earliest convenience with these once obtained.)  General: A & O * 3; visually in no acute distress; in usual state of health.  Skin: Visible skin appears normal and pt's usual skin color HEENT:  EOMI, head is normocephalic and atraumatic.  Sclera are anicteric. Neck has a good range of motion.  Lips are noncyanotic Chest: normal chest excursion and movement Respiratory: speaking in full sentences, no conversational dyspnea; no use of  accessory muscles Psych: insight good, mood- appears full

## 2019-04-27 ENCOUNTER — Inpatient Hospital Stay: Payer: 59

## 2019-04-27 ENCOUNTER — Encounter: Payer: Self-pay | Admitting: *Deleted

## 2019-04-27 ENCOUNTER — Other Ambulatory Visit: Payer: Self-pay

## 2019-04-27 ENCOUNTER — Inpatient Hospital Stay: Payer: 59 | Attending: Hematology

## 2019-04-27 ENCOUNTER — Inpatient Hospital Stay (HOSPITAL_BASED_OUTPATIENT_CLINIC_OR_DEPARTMENT_OTHER): Payer: 59 | Admitting: Hematology

## 2019-04-27 ENCOUNTER — Encounter: Payer: Self-pay | Admitting: Hematology

## 2019-04-27 VITALS — BP 126/76 | HR 78 | Temp 98.0°F | Resp 18 | Ht 63.0 in | Wt 180.6 lb

## 2019-04-27 DIAGNOSIS — Z5112 Encounter for antineoplastic immunotherapy: Secondary | ICD-10-CM | POA: Insufficient documentation

## 2019-04-27 DIAGNOSIS — C50411 Malignant neoplasm of upper-outer quadrant of right female breast: Secondary | ICD-10-CM

## 2019-04-27 DIAGNOSIS — Z170 Estrogen receptor positive status [ER+]: Secondary | ICD-10-CM

## 2019-04-27 DIAGNOSIS — Z95828 Presence of other vascular implants and grafts: Secondary | ICD-10-CM

## 2019-04-27 DIAGNOSIS — Z5111 Encounter for antineoplastic chemotherapy: Secondary | ICD-10-CM | POA: Diagnosis present

## 2019-04-27 LAB — CMP (CANCER CENTER ONLY)
ALT: 119 U/L — ABNORMAL HIGH (ref 0–44)
AST: 45 U/L — ABNORMAL HIGH (ref 15–41)
Albumin: 3.4 g/dL — ABNORMAL LOW (ref 3.5–5.0)
Alkaline Phosphatase: 76 U/L (ref 38–126)
Anion gap: 6 (ref 5–15)
BUN: 15 mg/dL (ref 6–20)
CO2: 24 mmol/L (ref 22–32)
Calcium: 8.8 mg/dL — ABNORMAL LOW (ref 8.9–10.3)
Chloride: 110 mmol/L (ref 98–111)
Creatinine: 0.79 mg/dL (ref 0.44–1.00)
GFR, Est AFR Am: >60 mL/min (ref >60)
GFR, Estimated: >60 mL/min (ref >60)
Glucose, Bld: 108 mg/dL — ABNORMAL HIGH (ref 70–99)
Potassium: 4 mmol/L (ref 3.5–5.1)
Sodium: 140 mmol/L (ref 135–145)
Total Bilirubin: 0.3 mg/dL (ref 0.3–1.2)
Total Protein: 5.8 g/dL — ABNORMAL LOW (ref 6.5–8.1)

## 2019-04-27 LAB — CBC WITH DIFFERENTIAL (CANCER CENTER ONLY)
Abs Immature Granulocytes: 0.04 10*3/uL (ref 0.00–0.07)
Basophils Absolute: 0.1 10*3/uL (ref 0.0–0.1)
Basophils Relative: 1 %
Eosinophils Absolute: 0.1 10*3/uL (ref 0.0–0.5)
Eosinophils Relative: 2 %
HCT: 32.9 % — ABNORMAL LOW (ref 36.0–46.0)
Hemoglobin: 11.2 g/dL — ABNORMAL LOW (ref 12.0–15.0)
Immature Granulocytes: 1 %
Lymphocytes Relative: 33 %
Lymphs Abs: 1.2 10*3/uL (ref 0.7–4.0)
MCH: 31 pg (ref 26.0–34.0)
MCHC: 34 g/dL (ref 30.0–36.0)
MCV: 91.1 fL (ref 80.0–100.0)
Monocytes Absolute: 0.4 10*3/uL (ref 0.1–1.0)
Monocytes Relative: 10 %
Neutro Abs: 1.9 10*3/uL (ref 1.7–7.7)
Neutrophils Relative %: 53 %
Platelet Count: 213 10*3/uL (ref 150–400)
RBC: 3.61 MIL/uL — ABNORMAL LOW (ref 3.87–5.11)
RDW: 13.9 % (ref 11.5–15.5)
WBC Count: 3.7 10*3/uL — ABNORMAL LOW (ref 4.0–10.5)
nRBC: 0 % (ref 0.0–0.2)

## 2019-04-27 MED ORDER — TRASTUZUMAB-ANNS CHEMO 150 MG IV SOLR
450.0000 mg | Freq: Once | INTRAVENOUS | Status: AC
  Administered 2019-04-27: 11:00:00 450 mg via INTRAVENOUS
  Filled 2019-04-27: qty 21.43

## 2019-04-27 MED ORDER — PROCHLORPERAZINE MALEATE 10 MG PO TABS
ORAL_TABLET | ORAL | Status: AC
  Filled 2019-04-27: qty 1

## 2019-04-27 MED ORDER — SODIUM CHLORIDE 0.9 % IV SOLN
Freq: Once | INTRAVENOUS | Status: AC
  Filled 2019-04-27: qty 250

## 2019-04-27 MED ORDER — DIPHENHYDRAMINE HCL 25 MG PO CAPS
50.0000 mg | ORAL_CAPSULE | Freq: Once | ORAL | Status: AC
  Administered 2019-04-27: 50 mg via ORAL

## 2019-04-27 MED ORDER — ACETAMINOPHEN 325 MG PO TABS
ORAL_TABLET | ORAL | Status: AC
  Filled 2019-04-27: qty 2

## 2019-04-27 MED ORDER — SODIUM CHLORIDE 0.9% FLUSH
10.0000 mL | Freq: Once | INTRAVENOUS | Status: AC
  Administered 2019-04-27: 09:00:00 10 mL
  Filled 2019-04-27: qty 10

## 2019-04-27 MED ORDER — HEPARIN SOD (PORK) LOCK FLUSH 100 UNIT/ML IV SOLN
500.0000 [IU] | Freq: Once | INTRAVENOUS | Status: AC | PRN
  Administered 2019-04-27: 500 [IU]
  Filled 2019-04-27: qty 5

## 2019-04-27 MED ORDER — SODIUM CHLORIDE 0.9% FLUSH
10.0000 mL | INTRAVENOUS | Status: DC | PRN
  Administered 2019-04-27: 10 mL
  Filled 2019-04-27: qty 10

## 2019-04-27 MED ORDER — DIPHENHYDRAMINE HCL 25 MG PO CAPS
ORAL_CAPSULE | ORAL | Status: AC
  Filled 2019-04-27: qty 2

## 2019-04-27 MED ORDER — PROCHLORPERAZINE MALEATE 10 MG PO TABS
10.0000 mg | ORAL_TABLET | Freq: Once | ORAL | Status: AC
  Administered 2019-04-27: 10 mg via ORAL

## 2019-04-27 MED ORDER — PACLITAXEL PROTEIN-BOUND CHEMO INJECTION 100 MG
80.0000 mg/m2 | Freq: Once | INTRAVENOUS | Status: AC
  Administered 2019-04-27: 150 mg via INTRAVENOUS
  Filled 2019-04-27: qty 30

## 2019-04-27 MED ORDER — ACETAMINOPHEN 325 MG PO TABS
650.0000 mg | ORAL_TABLET | Freq: Once | ORAL | Status: AC
  Administered 2019-04-27: 650 mg via ORAL

## 2019-04-27 NOTE — Progress Notes (Signed)
Per Dr. Burr Medico: okay to treat pt with elevated ALT of 119.

## 2019-04-27 NOTE — Patient Instructions (Signed)
[facility] Discharge Instructions for Patients Receiving Chemotherapy  Today you received the following chemotherapy agents Paclitaxel-protein (ABRAXANE) & Trastuzumab Alicia Hahn).  To help prevent nausea and vomiting after your treatment, we encourage you to take your nausea medication as prescribed.   If you develop nausea and vomiting that is not controlled by your nausea medication, call the clinic.   BELOW ARE SYMPTOMS THAT SHOULD BE REPORTED IMMEDIATELY:  *FEVER GREATER THAN 100.5 F  *CHILLS WITH OR WITHOUT FEVER  NAUSEA AND VOMITING THAT IS NOT CONTROLLED WITH YOUR NAUSEA MEDICATION  *UNUSUAL SHORTNESS OF BREATH  *UNUSUAL BRUISING OR BLEEDING  TENDERNESS IN MOUTH AND THROAT WITH OR WITHOUT PRESENCE OF ULCERS  *URINARY PROBLEMS  *BOWEL PROBLEMS  UNUSUAL RASH Items with * indicate a potential emergency and should be followed up as soon as possible.  Feel free to call the clinic should you have any questions or concerns. The clinic phone number is [phone].  Please show the [name] at check-in to the Emergency Department and triage nurse.

## 2019-04-28 NOTE — Progress Notes (Signed)
Pharmacist Chemotherapy Monitoring - Follow Up Assessment    I verify that I have reviewed each item in the below checklist:  . Regimen for the patient is scheduled for the appropriate day and plan matches scheduled date. Marland Kitchen Appropriate non-routine labs are ordered dependent on drug ordered. . If applicable, additional medications reviewed and ordered per protocol based on lifetime cumulative doses and/or treatment regimen.   Plan for follow-up and/or issues identified: No . I-vent associated with next due treatment: No . MD and/or nursing notified: No  Maryclare Nydam D 04/28/2019 9:08 AM

## 2019-05-04 ENCOUNTER — Inpatient Hospital Stay: Payer: 59

## 2019-05-04 ENCOUNTER — Ambulatory Visit: Payer: 59

## 2019-05-04 ENCOUNTER — Other Ambulatory Visit: Payer: 59

## 2019-05-04 ENCOUNTER — Other Ambulatory Visit: Payer: Self-pay

## 2019-05-04 ENCOUNTER — Encounter: Payer: Self-pay | Admitting: *Deleted

## 2019-05-04 ENCOUNTER — Other Ambulatory Visit: Payer: Self-pay | Admitting: Hematology

## 2019-05-04 VITALS — BP 126/84 | HR 82 | Temp 98.3°F | Resp 18 | Wt 180.0 lb

## 2019-05-04 DIAGNOSIS — C50411 Malignant neoplasm of upper-outer quadrant of right female breast: Secondary | ICD-10-CM

## 2019-05-04 DIAGNOSIS — Z170 Estrogen receptor positive status [ER+]: Secondary | ICD-10-CM

## 2019-05-04 DIAGNOSIS — Z5111 Encounter for antineoplastic chemotherapy: Secondary | ICD-10-CM | POA: Diagnosis not present

## 2019-05-04 DIAGNOSIS — Z95828 Presence of other vascular implants and grafts: Secondary | ICD-10-CM

## 2019-05-04 LAB — CBC WITH DIFFERENTIAL (CANCER CENTER ONLY)
Abs Immature Granulocytes: 0.09 10*3/uL — ABNORMAL HIGH (ref 0.00–0.07)
Basophils Absolute: 0.1 10*3/uL (ref 0.0–0.1)
Basophils Relative: 1 %
Eosinophils Absolute: 0.2 10*3/uL (ref 0.0–0.5)
Eosinophils Relative: 5 %
HCT: 32.4 % — ABNORMAL LOW (ref 36.0–46.0)
Hemoglobin: 11 g/dL — ABNORMAL LOW (ref 12.0–15.0)
Immature Granulocytes: 2 %
Lymphocytes Relative: 32 %
Lymphs Abs: 1.2 10*3/uL (ref 0.7–4.0)
MCH: 30.6 pg (ref 26.0–34.0)
MCHC: 34 g/dL (ref 30.0–36.0)
MCV: 90.3 fL (ref 80.0–100.0)
Monocytes Absolute: 0.4 10*3/uL (ref 0.1–1.0)
Monocytes Relative: 12 %
Neutro Abs: 1.8 10*3/uL (ref 1.7–7.7)
Neutrophils Relative %: 48 %
Platelet Count: 203 10*3/uL (ref 150–400)
RBC: 3.59 MIL/uL — ABNORMAL LOW (ref 3.87–5.11)
RDW: 14.6 % (ref 11.5–15.5)
WBC Count: 3.7 10*3/uL — ABNORMAL LOW (ref 4.0–10.5)
nRBC: 0.5 % — ABNORMAL HIGH (ref 0.0–0.2)

## 2019-05-04 LAB — CMP (CANCER CENTER ONLY)
ALT: 180 U/L — ABNORMAL HIGH (ref 0–44)
AST: 80 U/L — ABNORMAL HIGH (ref 15–41)
Albumin: 3.4 g/dL — ABNORMAL LOW (ref 3.5–5.0)
Alkaline Phosphatase: 83 U/L (ref 38–126)
Anion gap: 6 (ref 5–15)
BUN: 15 mg/dL (ref 6–20)
CO2: 26 mmol/L (ref 22–32)
Calcium: 8.7 mg/dL — ABNORMAL LOW (ref 8.9–10.3)
Chloride: 110 mmol/L (ref 98–111)
Creatinine: 0.83 mg/dL (ref 0.44–1.00)
GFR, Est AFR Am: >60 mL/min (ref >60)
GFR, Estimated: >60 mL/min (ref >60)
Glucose, Bld: 123 mg/dL — ABNORMAL HIGH (ref 70–99)
Potassium: 3.9 mmol/L (ref 3.5–5.1)
Sodium: 142 mmol/L (ref 135–145)
Total Bilirubin: 0.4 mg/dL (ref 0.3–1.2)
Total Protein: 6.1 g/dL — ABNORMAL LOW (ref 6.5–8.1)

## 2019-05-04 MED ORDER — SODIUM CHLORIDE 0.9 % IV SOLN
Freq: Once | INTRAVENOUS | Status: AC
  Filled 2019-05-04: qty 250

## 2019-05-04 MED ORDER — HEPARIN SOD (PORK) LOCK FLUSH 100 UNIT/ML IV SOLN
500.0000 [IU] | Freq: Once | INTRAVENOUS | Status: AC
  Administered 2019-05-04: 500 [IU]
  Filled 2019-05-04: qty 5

## 2019-05-04 MED ORDER — PROCHLORPERAZINE MALEATE 10 MG PO TABS
10.0000 mg | ORAL_TABLET | Freq: Once | ORAL | Status: AC
  Administered 2019-05-04: 10 mg via ORAL

## 2019-05-04 MED ORDER — PACLITAXEL PROTEIN-BOUND CHEMO INJECTION 100 MG
80.0000 mg/m2 | Freq: Once | INTRAVENOUS | Status: AC
  Administered 2019-05-04: 150 mg via INTRAVENOUS
  Filled 2019-05-04: qty 30

## 2019-05-04 MED ORDER — SODIUM CHLORIDE 0.9% FLUSH
10.0000 mL | Freq: Once | INTRAVENOUS | Status: AC
  Administered 2019-05-04: 10 mL
  Filled 2019-05-04: qty 10

## 2019-05-04 MED ORDER — SODIUM CHLORIDE 0.9% FLUSH
10.0000 mL | INTRAVENOUS | Status: DC | PRN
  Administered 2019-05-04: 10 mL
  Filled 2019-05-04: qty 10

## 2019-05-04 MED ORDER — HEPARIN SOD (PORK) LOCK FLUSH 100 UNIT/ML IV SOLN
500.0000 [IU] | Freq: Once | INTRAVENOUS | Status: DC | PRN
  Filled 2019-05-04: qty 5

## 2019-05-04 NOTE — Patient Instructions (Signed)
[facility] Discharge Instructions for Patients Receiving Chemotherapy  Today you received the following chemotherapy agents Paclitaxel-protein (ABRAXANE).  To help prevent nausea and vomiting after your treatment, we encourage you to take your nausea medication as prescribed  If you develop nausea and vomiting that is not controlled by your nausea medication, call the clinic.   BELOW ARE SYMPTOMS THAT SHOULD BE REPORTED IMMEDIATELY:  *FEVER GREATER THAN 100.5 F  *CHILLS WITH OR WITHOUT FEVER  NAUSEA AND VOMITING THAT IS NOT CONTROLLED WITH YOUR NAUSEA MEDICATION  *UNUSUAL SHORTNESS OF BREATH  *UNUSUAL BRUISING OR BLEEDING  TENDERNESS IN MOUTH AND THROAT WITH OR WITHOUT PRESENCE OF ULCERS  *URINARY PROBLEMS  *BOWEL PROBLEMS  UNUSUAL RASH Items with * indicate a potential emergency and should be followed up as soon as possible.  Feel free to call the clinic should you have any questions or concerns. The clinic phone number is [phone].  Please show the CHEMO ALERT CARD at check-in to the Emergency Department and triage nurse.   

## 2019-05-04 NOTE — Progress Notes (Signed)
Reviewed ALT/AST w/ Dr. Burr Medico. Tbili is wnl. Dr. Burr Medico will keep Abraxane dosed at 80 mg/m2 for last 2 txs. Pt almost done w/ Abraxane.  Kennith Center, Pharm.D., CPP 05/04/2019@2 :29 PM

## 2019-05-04 NOTE — Progress Notes (Signed)
Ok to treat with liver enzymes per Dr. Burr Medico.

## 2019-05-05 NOTE — Progress Notes (Signed)
Pharmacist Chemotherapy Monitoring - Follow Up Assessment    I verify that I have reviewed each item in the below checklist:  . Regimen for the patient is scheduled for the appropriate day and plan matches scheduled date. Marland Kitchen Appropriate non-routine labs are ordered dependent on drug ordered. . If applicable, additional medications reviewed and ordered per protocol based on lifetime cumulative doses and/or treatment regimen.   Plan for follow-up and/or issues identified: No . I-vent associated with next due treatment: No . MD and/or nursing notified: No  Romualdo Bolk [facility] 05/05/2019 11:05 AM

## 2019-05-05 NOTE — Progress Notes (Signed)
[place]   [phone] [phone]   Clinic Follow up Note   Patient Care Team: Mellody Dance, DO as PCP - General (Family Medicine) Mcarthur Rossetti, MD as Consulting Physician (Orthopedic Surgery) Molli Posey, MD as Consulting Physician (Obstetrics and Gynecology) Erroll Luna, MD as Consulting Physician (General Surgery) Truitt Merle, MD as Consulting Physician (Hematology) Kyung Rudd, MD as Consulting Physician ([place]) Mauro Kaufmann, RN as Oncology Nurse Navigator Rockwell Germany, RN as Oncology Nurse Navigator  Date of Service:  05/11/2019  CHIEF COMPLAINT: Follow-up right breast cancer  SUMMARY OF ONCOLOGIC HISTORY: Oncology History Overview Note  Cancer Staging Malignant neoplasm of upper-outer quadrant of right breast in female, estrogen receptor positive ([place]) Staging form: Breast, AJCC 8th Edition - Clinical stage from 12/29/2018: Stage IA (cT1b, cN0, cM0, G2, ER+, PR+, HER2+) - Unsigned - Pathologic stage from 01/27/2019: Stage IA (pT1c, pN0, cM0, G3, ER+, PR+, HER2+) - Signed by Truitt Merle, MD on 02/06/2019 Cancer Staging No matching staging information was found for the patient.    Malignant neoplasm of upper-outer quadrant of right breast in female, estrogen receptor positive ([place])  12/22/2018 Mammogram    Diagnostic Mammogram 12/22/18  IMPRESSION: 1. 9 mm mass, with associated calcifications and architectural distortion, in the 11 o'clock position of the right breast, highly suspicious for breast carcinoma. 2. Questionable mass noted in the inferior aspect of the right breast remains equivocal on diagnostic imaging may reflect focal fibroglandular tissue. There is no sonographic correlate.   12/23/2018 Initial Biopsy   Diagnosis 12/23/18 Breast, right, needle core biopsy, 11 o'clock position - INVASIVE DUCTAL CARCINOMA, SEE COMMENT. - DUCTAL CARCINOMA IN SITU WITH NECROSIS.   12/28/2018 Initial  Diagnosis   Malignant neoplasm of upper-outer quadrant of right breast in female, estrogen receptor positive ([place])   01/13/2019 Imaging   MRI Breast 01/13/19  IMPRESSION: 1. Biopsy proven malignancy in the upper-outer right breast. No additional suspicious findings on the right. 2. No MRI evidence of malignancy on the left. 3. No suspicious lymphadenopathy.   01/27/2019 Surgery   RIGHT BREAST LUMPECTOMY WITH RADIOACTIVE SEED AND SENTINEL LYMPH NODE MAPPING and PAC placement by Dr Brantley Stage  01/27/19   01/27/2019 Pathology Results   FINAL MICROSCOPIC DIAGNOSIS: 01/27/19  A. BREAST, RIGHT, LUMPECTOMY:  - Invasive ductal carcinoma, grade 3, spanning 1.3 cm.  - High grade ductal carcinoma in situ with necrosis.  - Biopsy site.  - Final resection margins are negative for carcinoma.  - See oncology table.   B. BREAST, RIGHT ADDITIONAL SUPERIOR MARGIN, EXCISION:  - Fibrocystic change and adenosis.  - No malignancy identified.   C. LYMPH NODE, RIGHT #1, SENTINEL, BIOPSY:  - One of one lymph nodes negative for carcinoma (0/1).   D. LYMPH NODE, RIGHT, SENTINEL, BIOPSY:  - One of one lymph nodes negative for carcinoma (0/1).   E. LYMPH NODE, RIGHT, SENTINEL, BIOPSY:  - One of one lymph nodes negative for carcinoma (0/1).   F. LYMPH NODE, RIGHT, SENTINEL, BIOPSY:  - One of one lymph nodes negative for carcinoma (0/1).       01/27/2019 Cancer Staging   Staging form: Breast, AJCC 8th Edition - Pathologic stage from 01/27/2019: Stage IA (pT1c, pN0, cM0, G3, ER+, PR+, HER2+) - Signed by Truitt Merle, MD on 02/06/2019   02/08/2019 Imaging   CT AP W Contrast 02/08/19  IMPRESSION: 1. Wall thickening of the gastric antrum may represent gastritis.   Aortic Atherosclerosis (ICD10-I70.0).   02/23/2019 -  Chemotherapy   Adjuvant weekly Abraxane with Herceptin q3weeks starting 02/23/19. Completed 12 week of Abraxane on 05/11/19. Maintenance Herceptin q3weeks start 05/18/19 with injection.       CURRENT  THERAPY:  -Adjuvant weekly Abraxane with Herceptin q3weeks starting2/3/21. Completed 12 week of Abraxane on 05/11/19. Maintenance Herceptin q3weeks start 05/18/19 with injection.  -PENDING start of RT  INTERVAL HISTORY:  Alicia Hahn is here for a follow up and treatment. She presents to the clinic alone. She notes her last cycle had SOB with walking across the room or activities. She denies chest pain but has tightness in her rib area. She notes having neuropathy in her fingers this week and she feels her arms feel numb, painful and heavy. She notes her left upper extremity is worse than her right. She can still cook, but has to adjust. She notes b/l LE edema. She denies LE weakness, only upper extremity.    REVIEW OF SYSTEMS:   Constitutional: Denies fevers, chills or abnormal weight loss Eyes: Denies blurriness of vision Ears, nose, mouth, throat, and face: Denies mucositis or sore throat Respiratory: Denies cough, dyspnea or wheezes (+)SOB and chest tightness of ribcage Cardiovascular: Denies palpitation, chest discomfort (+) lower extremity swelling Gastrointestinal:  Denies nausea, heartburn or change in bowel habits Skin: Denies abnormal skin rashes Lymphatics: Denies new lymphadenopathy or easy bruising Neurological: (+) Weakness and Neuropathy with mainly numbness in fingers and hands, L>R.  Behavioral/Psych: Mood is stable, no new changes  All other systems were reviewed with the patient and are negative.  MEDICAL HISTORY:  Past Medical History:  Diagnosis Date  . Cancer [facility])    breast cancer  . Hyperlipidemia     SURGICAL HISTORY: Past Surgical History:  Procedure Laterality Date  . BREAST LUMPECTOMY WITH RADIOACTIVE SEED AND SENTINEL LYMPH NODE BIOPSY Right 01/27/2019   Procedure: RIGHT BREAST LUMPECTOMY WITH RADIOACTIVE SEED AND SENTINEL LYMPH NODE MAPPING;  Surgeon: Erroll Luna, MD;  Location: [place];  Service: General;  Laterality: Right;    . COSMETIC SURGERY Bilateral 2000   breast aug/ lipo  . PORTACATH PLACEMENT Right 01/27/2019   Procedure: INSERTION PORT-A-CATH WITH ULTRASOUND;  Surgeon: Erroll Luna, MD;  Location: [place];  Service: General;  Laterality: Right;    I have reviewed the social history and family history with the patient and they are unchanged from previous note.  ALLERGIES:  has No Known Allergies.  MEDICATIONS:  Current Outpatient Medications  Medication Sig Dispense Refill  . CALCIUM PO Take 1,000 mg by mouth daily.    Marland Kitchen lidocaine-prilocaine (EMLA) cream Apply to affected area once 30 g 3  . Magnesium 125 MG CAPS Take 1 capsule by mouth daily.     . Melatonin 3 MG CAPS Take 3 mg by mouth at bedtime.     . Multiple Vitamins-Minerals (MULTIVITAMIN WITH MINERALS) tablet Take 1 tablet by mouth daily.    . pantoprazole (PROTONIX) 40 MG tablet Take 1 tablet (40 mg total) by mouth 2 (two) times daily. 112 tablet 0  . prochlorperazine (COMPAZINE) 10 MG tablet Take 1 tablet (10 mg total) by mouth every 6 (six) hours as needed (Nausea or vomiting). 30 tablet 1  . traZODone (DESYREL) 50 MG tablet Take 1 tablet (50 mg total) by mouth at bedtime as needed for sleep. 90 tablet 1  . VITAMIN D PO Take 12,000 Units by mouth daily.     No current facility-administered medications for this visit.    PHYSICAL EXAMINATION:  ECOG PERFORMANCE STATUS: 2 - Symptomatic, <50% confined to bed  Vitals:   05/11/19 1017  BP: 129/83  Pulse: 90  Resp: 18  Temp: 98.3 F (36.8 C)  SpO2: 100%   Filed Weights   05/11/19 1017  Weight: 183 lb 3.2 oz (83.1 kg)    GENERAL:alert, no distress and comfortable SKIN: skin color, texture, turgor are normal, no rashes or significant lesions EYES: normal, Conjunctiva are pink and non-injected, sclera clear  NECK: supple, thyroid normal size, non-tender, without nodularity LYMPH:  no palpable lymphadenopathy in the cervical, axillary  LUNGS: clear to  auscultation and percussion with normal breathing effort HEART: regular rate & rhythm and no murmurs and no lower extremity edema ABDOMEN:abdomen soft, non-tender and normal bowel sounds. No hepatomegaly  Musculoskeletal:no cyanosis of digits and no clubbing (+) Right thoracic ribcage tenderness NEURO: alert & oriented x 3 with fluent speech, no focal motor/sensory deficits BREAST: S/p right lumpectomy: Surgical incision healed well. (+) Very mild right breast lymphedema. No palpable mass, nodules or adenopathy bilaterally. Breast exam benign.   LABORATORY DATA:  I have reviewed the data as listed CBC Latest Ref Rng & Units 05/11/2019 05/04/2019 04/27/2019  WBC 4.0 - 10.5 K/uL 4.5 3.7(L) 3.7(L)  Hemoglobin 12.0 - 15.0 g/dL 11.1(L) 11.0(L) 11.2(L)  Hematocrit 36.0 - 46.0 % 32.7(L) 32.4(L) 32.9(L)  Platelets 150 - 400 K/uL 204 203 213     CMP Latest Ref Rng & Units 05/11/2019 05/04/2019 04/27/2019  Glucose 70 - 99 mg/dL 130(H) 123(H) 108(H)  BUN 6 - 20 mg/dL '16 15 15  '$ Creatinine 0.44 - 1.00 mg/dL 0.84 0.83 0.79  Sodium 135 - 145 mmol/L 140 142 140  Potassium 3.5 - 5.1 mmol/L 3.8 3.9 4.0  Chloride 98 - 111 mmol/L 110 110 110  CO2 22 - 32 mmol/L '23 26 24  '$ Calcium 8.9 - 10.3 mg/dL 8.4(L) 8.7(L) 8.8(L)  Total Protein 6.5 - 8.1 g/dL 5.7(L) 6.1(L) 5.8(L)  Total Bilirubin 0.3 - 1.2 mg/dL 0.4 0.4 0.3  Alkaline Phos 38 - 126 U/L 78 83 76  AST 15 - 41 U/L 51(H) 80(H) 45(H)  ALT 0 - 44 U/L 126(H) 180(H) 119(H)      RADIOGRAPHIC STUDIES: I have personally reviewed the radiological images as listed and agreed with the findings in the report. No results found.   ASSESSMENT & PLAN:  Alicia Hahn is a 59 y.o. female with    1.Malignant neoplasm of upper-outer quadrant of right breast,invasive ductal carcinoma,pT1cN0M0,stageIA,Triple Positive,Grade3 -She was recently diagnosed in 12/2018.She has 64m mass of right breast with invasive ductal carcinoma and components of DCIS.She  underwent right lumpectomy and SLNB on 01/27/19 by Dr CBrantley Stage  -Given the size of her tumor and HER2 positive disease she has higher risk of recurrence. Istarted her onadjuvant chemotherapy with Weekly Abraxane and Herceptin q3weeks for 12 weeksbeginning2/3/21. Plan to continue maintenance Herceptin to complete 1 year of treatment.  -S/p C11 she has progressive SOB and upper extremely neuropathy and weakness.  -Labs reviewed, Hg 11, BG 130, Ca 8.4, Protein 5.7, albumin 3.2, AST 51, ALT 126. Overall adequate to proceed with final Abraxane cycle at reduced dose. She is agreeable and will ice bag during infusion.  -Will continue with maintenance Herceptin for 1 year of treatment. She plans to change to injections soon and will continue PAC flushes. Next Herceptin next week (05/18/19) -She plans to proceed with Adjuvant RT next month before starting antiestrogen therapy.  -I reviewed breast cancer surveillance. She will continue annual  screening mammogram, self exam, and a routine office visit with lab and exam with [place]. -f/u in 4 weeks    2. Dyspnea on exertion, Neuropathy and weakness.  -secondary to Abraxane s/p C11 she has developed bilateral upper extremity weakness and Neuropathy, L>R.  -Will dose reduce last cycle Abraxane.  -I discussed her neuropathy will take time to resolve.    3. Ribcage tightness -She notes band of chest tightness around her ribcage which she has had but recently increased along with her SOB now with any activity.  -Right ribcage tenderness under breast on today's exam (05/11/19). She also has very mild right breast lymphedema which I feel is related. She has completed PT.  -Her pain is 3/10, not on OTC pain meds -Will monitor.    3. Gastric ulcers  -After surgery she took Motrin and started having severe epigastric pain and required ED visit.  -Her 02/25/19 Endoscopy with Dr. Tarri Glenn showed 5 small gastric ulcers, benign. She is currently on Protonix and will  continues until she completes chemo. Will repeat endoscopy with Dr. Tarri Glenn in May, 2021.She can request colonoscopy to be done same day. -Pain is manageable on ProtonixBID.   4. Estrogen replacement andpostmenopausal vaginalbleeding  -Started after 2nd Mirena was taken out. Her Gyn put her on a third Mirena and bleeding stopped.  Vernia Buff estrogen replacement for several years for hot flashes. By her GYN. -Given her age she is likely postmenopausal and her given ER/PR positive breast cancer Ipreviouslyrecommendedshe take her Mirena out. If she has further vaginal bleeding she should undergo workup for this with her Gyn.    5. Genetics  -Based on family history her father has prostate cancer and she had significant family history of breast cancer. She is eligible for genetic testing.  -Pt notes she genetic testing before at [facility]. Will obtain records.   6. Transaminitis -she was noticed to have alt 65 before she started chemo, which was normal one month before.  Transaminitis got worse after she started chemo.  Likely related to chemo, also possible related to Lipitor which started a few months ago -Her LFTs did improve after stopping lipitor. -Follow-up lab weekly.Has improved this week.    PLAN: -Lab reviewedand adequate toproceedwith week 12 Abraxane today, will reduce dose to '60mg'$ /m2  -will change trastuzumab from infusion to injection next week, OK to treat wo lab  -Lab, flush and Herceptin in 6 weeks -Proceed with RT in about a month  -F/u in 4 weeks    No problem-specific Assessment & Plan notes found for this encounter.   No orders of the defined types were placed in this encounter.  All questions were answered. The patient knows to call the clinic with any problems, questions or concerns. No barriers to learning was detected. The total time spent in the appointment was 30 minutes.     Truitt Merle, MD 05/11/2019   I, Joslyn Devon, am acting as scribe for Truitt Merle, MD.   I have reviewed the above documentation for accuracy and completeness, and I agree with the above.

## 2019-05-11 ENCOUNTER — Telehealth: Payer: Self-pay | Admitting: Hematology

## 2019-05-11 ENCOUNTER — Inpatient Hospital Stay: Payer: 59

## 2019-05-11 ENCOUNTER — Encounter: Payer: Self-pay | Admitting: *Deleted

## 2019-05-11 ENCOUNTER — Other Ambulatory Visit: Payer: Self-pay

## 2019-05-11 ENCOUNTER — Inpatient Hospital Stay (HOSPITAL_BASED_OUTPATIENT_CLINIC_OR_DEPARTMENT_OTHER): Payer: 59 | Admitting: Hematology

## 2019-05-11 ENCOUNTER — Encounter: Payer: Self-pay | Admitting: Hematology

## 2019-05-11 VITALS — BP 129/83 | HR 90 | Temp 98.3°F | Resp 18 | Ht 63.0 in | Wt 183.2 lb

## 2019-05-11 DIAGNOSIS — Z170 Estrogen receptor positive status [ER+]: Secondary | ICD-10-CM

## 2019-05-11 DIAGNOSIS — C50411 Malignant neoplasm of upper-outer quadrant of right female breast: Secondary | ICD-10-CM

## 2019-05-11 DIAGNOSIS — Z95828 Presence of other vascular implants and grafts: Secondary | ICD-10-CM

## 2019-05-11 DIAGNOSIS — Z5111 Encounter for antineoplastic chemotherapy: Secondary | ICD-10-CM | POA: Diagnosis not present

## 2019-05-11 LAB — CBC WITH DIFFERENTIAL (CANCER CENTER ONLY)
Abs Immature Granulocytes: 0.13 10*3/uL — ABNORMAL HIGH (ref 0.00–0.07)
Basophils Absolute: 0.1 10*3/uL (ref 0.0–0.1)
Basophils Relative: 2 %
Eosinophils Absolute: 0.1 10*3/uL (ref 0.0–0.5)
Eosinophils Relative: 3 %
HCT: 32.7 % — ABNORMAL LOW (ref 36.0–46.0)
Hemoglobin: 11.1 g/dL — ABNORMAL LOW (ref 12.0–15.0)
Immature Granulocytes: 3 %
Lymphocytes Relative: 32 %
Lymphs Abs: 1.4 10*3/uL (ref 0.7–4.0)
MCH: 30.7 pg (ref 26.0–34.0)
MCHC: 33.9 g/dL (ref 30.0–36.0)
MCV: 90.3 fL (ref 80.0–100.0)
Monocytes Absolute: 0.5 10*3/uL (ref 0.1–1.0)
Monocytes Relative: 11 %
Neutro Abs: 2.2 10*3/uL (ref 1.7–7.7)
Neutrophils Relative %: 49 %
Platelet Count: 204 10*3/uL (ref 150–400)
RBC: 3.62 MIL/uL — ABNORMAL LOW (ref 3.87–5.11)
RDW: 14.7 % (ref 11.5–15.5)
WBC Count: 4.5 10*3/uL (ref 4.0–10.5)
nRBC: 0.4 % — ABNORMAL HIGH (ref 0.0–0.2)

## 2019-05-11 LAB — CMP (CANCER CENTER ONLY)
ALT: 126 U/L — ABNORMAL HIGH (ref 0–44)
AST: 51 U/L — ABNORMAL HIGH (ref 15–41)
Albumin: 3.2 g/dL — ABNORMAL LOW (ref 3.5–5.0)
Alkaline Phosphatase: 78 U/L (ref 38–126)
Anion gap: 7 (ref 5–15)
BUN: 16 mg/dL (ref 6–20)
CO2: 23 mmol/L (ref 22–32)
Calcium: 8.4 mg/dL — ABNORMAL LOW (ref 8.9–10.3)
Chloride: 110 mmol/L (ref 98–111)
Creatinine: 0.84 mg/dL (ref 0.44–1.00)
GFR, Est AFR Am: >60 mL/min (ref >60)
GFR, Estimated: >60 mL/min (ref >60)
Glucose, Bld: 130 mg/dL — ABNORMAL HIGH (ref 70–99)
Potassium: 3.8 mmol/L (ref 3.5–5.1)
Sodium: 140 mmol/L (ref 135–145)
Total Bilirubin: 0.4 mg/dL (ref 0.3–1.2)
Total Protein: 5.7 g/dL — ABNORMAL LOW (ref 6.5–8.1)

## 2019-05-11 MED ORDER — PROCHLORPERAZINE MALEATE 10 MG PO TABS
ORAL_TABLET | ORAL | Status: AC
  Filled 2019-05-11: qty 1

## 2019-05-11 MED ORDER — SODIUM CHLORIDE 0.9% FLUSH
10.0000 mL | Freq: Once | INTRAVENOUS | Status: AC
  Administered 2019-05-11: 10 mL
  Filled 2019-05-11: qty 10

## 2019-05-11 MED ORDER — HEPARIN SOD (PORK) LOCK FLUSH 100 UNIT/ML IV SOLN
500.0000 [IU] | Freq: Once | INTRAVENOUS | Status: AC | PRN
  Administered 2019-05-11: 500 [IU]
  Filled 2019-05-11: qty 5

## 2019-05-11 MED ORDER — PROCHLORPERAZINE MALEATE 10 MG PO TABS
10.0000 mg | ORAL_TABLET | Freq: Once | ORAL | Status: AC
  Administered 2019-05-11: 10 mg via ORAL

## 2019-05-11 MED ORDER — SODIUM CHLORIDE 0.9 % IV SOLN
Freq: Once | INTRAVENOUS | Status: AC
  Filled 2019-05-11: qty 250

## 2019-05-11 MED ORDER — PACLITAXEL PROTEIN-BOUND CHEMO INJECTION 100 MG
60.0000 mg/m2 | Freq: Once | INTRAVENOUS | Status: AC
  Administered 2019-05-11: 100 mg via INTRAVENOUS
  Filled 2019-05-11: qty 20

## 2019-05-11 MED ORDER — SODIUM CHLORIDE 0.9% FLUSH
10.0000 mL | INTRAVENOUS | Status: DC | PRN
  Administered 2019-05-11: 13:00:00 10 mL
  Filled 2019-05-11: qty 10

## 2019-05-11 NOTE — Telephone Encounter (Signed)
Scheduled appt per 4/21 los.  Patient will get an updated appt calendar at their next appt.

## 2019-05-11 NOTE — Patient Instructions (Signed)
[facility] Discharge Instructions for Patients Receiving Chemotherapy  Today you received the following chemotherapy agents Paclitaxel-protein (ABRAXANE).  To help prevent nausea and vomiting after your treatment, we encourage you to take your nausea medication as prescribed  If you develop nausea and vomiting that is not controlled by your nausea medication, call the clinic.   BELOW ARE SYMPTOMS THAT SHOULD BE REPORTED IMMEDIATELY:  *FEVER GREATER THAN 100.5 F  *CHILLS WITH OR WITHOUT FEVER  NAUSEA AND VOMITING THAT IS NOT CONTROLLED WITH YOUR NAUSEA MEDICATION  *UNUSUAL SHORTNESS OF BREATH  *UNUSUAL BRUISING OR BLEEDING  TENDERNESS IN MOUTH AND THROAT WITH OR WITHOUT PRESENCE OF ULCERS  *URINARY PROBLEMS  *BOWEL PROBLEMS  UNUSUAL RASH Items with * indicate a potential emergency and should be followed up as soon as possible.  Feel free to call the clinic should you have any questions or concerns. The clinic phone number is [phone].  Please show the CHEMO ALERT CARD at check-in to the Emergency Department and triage nurse.   

## 2019-05-11 NOTE — Patient Instructions (Signed)

## 2019-05-11 NOTE — Progress Notes (Signed)
Per Dr. Burr Medico ok to treat with ALT 126

## 2019-05-12 NOTE — Progress Notes (Signed)
Pharmacist Chemotherapy Monitoring - Follow Up Assessment    I verify that I have reviewed each item in the below checklist:  . Regimen for the patient is scheduled for the appropriate day and plan matches scheduled date. Marland Kitchen Appropriate non-routine labs are ordered dependent on drug ordered. . If applicable, additional medications reviewed and ordered per protocol based on lifetime cumulative doses and/or treatment regimen.   Plan for follow-up and/or issues identified: Yes . I-vent associated with next due treatment: Yes . MD and/or nursing notified: No  Acquanetta Belling 05/12/2019 4:27 PM

## 2019-05-17 ENCOUNTER — Other Ambulatory Visit: Payer: Self-pay | Admitting: Hematology

## 2019-05-17 NOTE — Progress Notes (Signed)
[employer] denied Herceptin [place].  Pref [place]. MD aware & would like to [place]. Orders updated.  MD will review & sign. RN to notify pt of plan.  Kennith Center, Pharm.D., CPP 05/17/2019@4 :26 PM

## 2019-05-18 ENCOUNTER — Other Ambulatory Visit: Payer: 59

## 2019-05-18 ENCOUNTER — Other Ambulatory Visit: Payer: Self-pay

## 2019-05-18 ENCOUNTER — Inpatient Hospital Stay: Payer: 59

## 2019-05-18 VITALS — BP 118/85 | HR 91 | Temp 98.0°F | Resp 18

## 2019-05-18 DIAGNOSIS — Z170 Estrogen receptor positive status [ER+]: Secondary | ICD-10-CM

## 2019-05-18 DIAGNOSIS — Z5111 Encounter for antineoplastic chemotherapy: Secondary | ICD-10-CM | POA: Diagnosis not present

## 2019-05-18 DIAGNOSIS — C50411 Malignant neoplasm of upper-outer quadrant of right female breast: Secondary | ICD-10-CM

## 2019-05-18 MED ORDER — DIPHENHYDRAMINE HCL 25 MG PO CAPS: 50.0000 mg | ORAL_CAPSULE | Freq: Once | ORAL | Status: DC

## 2019-05-18 MED ORDER — TRASTUZUMAB-ANNS CHEMO 150 MG IV SOLR
450.0000 mg | Freq: Once | INTRAVENOUS | Status: AC
  Administered 2019-05-18: 12:00:00 450 mg via INTRAVENOUS
  Filled 2019-05-18: qty 21.43

## 2019-05-18 MED ORDER — DIPHENHYDRAMINE HCL 25 MG PO CAPS
ORAL_CAPSULE | ORAL | Status: AC
  Filled 2019-05-18: qty 2

## 2019-05-18 MED ORDER — SODIUM CHLORIDE 0.9 % IV SOLN
Freq: Once | INTRAVENOUS | Status: AC
  Filled 2019-05-18: qty 250

## 2019-05-18 MED ORDER — HEPARIN SOD (PORK) LOCK FLUSH 100 UNIT/ML IV SOLN
500.0000 [IU] | Freq: Once | INTRAVENOUS | Status: AC | PRN
  Administered 2019-05-18: 500 [IU]
  Filled 2019-05-18: qty 5

## 2019-05-18 MED ORDER — DIPHENHYDRAMINE HCL 25 MG PO CAPS
25.0000 mg | ORAL_CAPSULE | Freq: Once | ORAL | Status: AC
  Administered 2019-05-18: 11:00:00 25 mg via ORAL

## 2019-05-18 MED ORDER — ACETAMINOPHEN 325 MG PO TABS
ORAL_TABLET | ORAL | Status: AC
  Filled 2019-05-18: qty 2

## 2019-05-18 MED ORDER — SODIUM CHLORIDE 0.9% FLUSH
10.0000 mL | INTRAVENOUS | Status: DC | PRN
  Administered 2019-05-18: 10 mL
  Filled 2019-05-18: qty 10

## 2019-05-18 MED ORDER — ACETAMINOPHEN 325 MG PO TABS
650.0000 mg | ORAL_TABLET | Freq: Once | ORAL | Status: AC
  Administered 2019-05-18: 650 mg via ORAL

## 2019-05-18 NOTE — Patient Instructions (Signed)
Cancer Center Discharge Instructions for Patients Receiving Chemotherapy  Today you received the following chemotherapy agents trastuzumab.  To help prevent nausea and vomiting after your treatment, we encourage you to take your nausea medication as directed.    If you develop nausea and vomiting that is not controlled by your nausea medication, call the clinic.   BELOW ARE SYMPTOMS THAT SHOULD BE REPORTED IMMEDIATELY:  *FEVER GREATER THAN 100.5 F  *CHILLS WITH OR WITHOUT FEVER  NAUSEA AND VOMITING THAT IS NOT CONTROLLED WITH YOUR NAUSEA MEDICATION  *UNUSUAL SHORTNESS OF BREATH  *UNUSUAL BRUISING OR BLEEDING  TENDERNESS IN MOUTH AND THROAT WITH OR WITHOUT PRESENCE OF ULCERS  *URINARY PROBLEMS  *BOWEL PROBLEMS  UNUSUAL RASH Items with * indicate a potential emergency and should be followed up as soon as possible.  Feel free to call the clinic should you have any questions or concerns. The clinic phone number is [phone].  Please show the CHEMO ALERT CARD at check-in to the Emergency Department and triage nurse.   

## 2019-05-25 ENCOUNTER — Telehealth: Payer: Self-pay | Admitting: Gastroenterology

## 2019-05-25 NOTE — Telephone Encounter (Signed)
Please see note below and advise if ok to schedule colon also.

## 2019-05-25 NOTE — Telephone Encounter (Signed)
Please schedule pt for egd colon, see note below.

## 2019-05-25 NOTE — Telephone Encounter (Signed)
Patient is calling- she had a EGD in Feb. And Dr. Tarri Glenn wanted her to have a repeat in 3 months because of an ulcer. She is due now and wants to schedule but patient is asking if she can have a colonoscopy done with this endo. She states that she was going through chemo earlier this year and was told to hold off until she was done with it. She states she is done and wants to have them done but wanted to make sure it was okay to have both done together.

## 2019-05-25 NOTE — Telephone Encounter (Signed)
OK to proceed with EGD and colonoscopy. Thank you.

## 2019-05-25 NOTE — Telephone Encounter (Signed)
LVM for patient to call and schedule endo/colon.

## 2019-05-27 ENCOUNTER — Other Ambulatory Visit: Payer: Self-pay

## 2019-05-27 ENCOUNTER — Ambulatory Visit (AMBULATORY_SURGERY_CENTER): Payer: Self-pay | Admitting: *Deleted

## 2019-05-27 VITALS — Temp 97.8°F | Ht 63.0 in | Wt 177.0 lb

## 2019-05-27 DIAGNOSIS — Z1211 Encounter for screening for malignant neoplasm of colon: Secondary | ICD-10-CM

## 2019-05-27 DIAGNOSIS — K259 Gastric ulcer, unspecified as acute or chronic, without hemorrhage or perforation: Secondary | ICD-10-CM

## 2019-05-27 MED ORDER — NA SULFATE-K SULFATE-MG SULF 17.5-3.13-1.6 GM/177ML PO SOLN: 1.0000 | Freq: Once | ORAL | 0 refills | Status: AC

## 2019-06-01 ENCOUNTER — Encounter: Payer: Self-pay | Admitting: Radiation Oncology

## 2019-06-01 ENCOUNTER — Telehealth: Payer: Self-pay

## 2019-06-01 NOTE — Telephone Encounter (Signed)
Appointment reminder for 06/02/19 meaningful use complete 

## 2019-06-02 ENCOUNTER — Ambulatory Visit
Admission: RE | Admit: 2019-06-02 | Discharge: 2019-06-02 | Disposition: A | Discharge status: Home / Self Care | Payer: 59 | Source: Ambulatory Visit | Attending: Radiation Oncology | Admitting: Radiation Oncology

## 2019-06-02 ENCOUNTER — Ambulatory Visit (AMBULATORY_SURGERY_CENTER): Payer: 59 | Admitting: Gastroenterology

## 2019-06-02 ENCOUNTER — Encounter: Payer: Self-pay | Admitting: Gastroenterology

## 2019-06-02 ENCOUNTER — Encounter: Payer: Self-pay | Admitting: *Deleted

## 2019-06-02 ENCOUNTER — Encounter: Payer: Self-pay | Admitting: Radiation Oncology

## 2019-06-02 ENCOUNTER — Other Ambulatory Visit: Payer: Self-pay

## 2019-06-02 VITALS — BP 143/84 | HR 90 | Temp 98.3°F | Resp 20 | Ht 63.0 in | Wt 178.4 lb

## 2019-06-02 VITALS — BP 137/61 | HR 70 | Temp 96.2°F | Resp 11 | Ht 63.0 in | Wt 177.0 lb

## 2019-06-02 DIAGNOSIS — Z800 Family history of malignant neoplasm of digestive organs: Secondary | ICD-10-CM | POA: Insufficient documentation

## 2019-06-02 DIAGNOSIS — E785 Hyperlipidemia, unspecified: Secondary | ICD-10-CM | POA: Insufficient documentation

## 2019-06-02 DIAGNOSIS — Z510 Encounter for antineoplastic radiation therapy: Secondary | ICD-10-CM | POA: Insufficient documentation

## 2019-06-02 DIAGNOSIS — C50411 Malignant neoplasm of upper-outer quadrant of right female breast: Secondary | ICD-10-CM

## 2019-06-02 DIAGNOSIS — L539 Erythematous condition, unspecified: Secondary | ICD-10-CM | POA: Insufficient documentation

## 2019-06-02 DIAGNOSIS — Z8719 Personal history of other diseases of the digestive system: Secondary | ICD-10-CM

## 2019-06-02 DIAGNOSIS — Z8711 Personal history of peptic ulcer disease: Secondary | ICD-10-CM

## 2019-06-02 DIAGNOSIS — Z79899 Other long term (current) drug therapy: Secondary | ICD-10-CM | POA: Diagnosis not present

## 2019-06-02 DIAGNOSIS — Z1211 Encounter for screening for malignant neoplasm of colon: Secondary | ICD-10-CM | POA: Diagnosis not present

## 2019-06-02 DIAGNOSIS — Z170 Estrogen receptor positive status [ER+]: Secondary | ICD-10-CM | POA: Insufficient documentation

## 2019-06-02 DIAGNOSIS — Z803 Family history of malignant neoplasm of breast: Secondary | ICD-10-CM | POA: Diagnosis not present

## 2019-06-02 DIAGNOSIS — D125 Benign neoplasm of sigmoid colon: Secondary | ICD-10-CM

## 2019-06-02 DIAGNOSIS — Z8042 Family history of malignant neoplasm of prostate: Secondary | ICD-10-CM | POA: Diagnosis not present

## 2019-06-02 MED ORDER — SODIUM CHLORIDE 0.9 % IV SOLN: 500.0000 mL | Freq: Once | INTRAVENOUS | Status: DC

## 2019-06-02 NOTE — Progress Notes (Signed)
Pharmacist Chemotherapy Monitoring - Follow Up Assessment    I verify that I have reviewed each item in the below checklist:  . Regimen for the patient is scheduled for the appropriate day and plan matches scheduled date. Marland Kitchen Appropriate non-routine labs are ordered dependent on drug ordered. . If applicable, additional medications reviewed and ordered per protocol based on lifetime cumulative doses and/or treatment regimen.   Plan for follow-up and/or issues identified: No . I-vent associated with next due treatment: No . MD and/or nursing notified: No  Alicia Hahn D 06/02/2019 2:16 PM

## 2019-06-02 NOTE — Progress Notes (Signed)
Pt's states no medical or surgical changes since previsit or office visit.  Vs-DT  Temp- JB

## 2019-06-02 NOTE — Op Note (Addendum)
[place] Patient Name: Alicia Hahn Procedure Date: 06/02/2019 8:53 AM MRN: [id number] Endoscopist: Thornton Park MD, MD Age: 59 Referring MD:  Date of Birth: Jul 04, 1960 Gender: Female Account #: [id number] Procedure:                Colonoscopy Indications:              Screening for colorectal malignant neoplasm, This                            is the patient's first colonoscopy                           Maternal grandfather with colon cancer in his 37s Medicines:                Monitored Anesthesia Care Procedure:                Pre-Anesthesia Assessment:                           - Prior to the procedure, a History and Physical                            was performed, and patient medications and                            allergies were reviewed. The patient's tolerance of                            previous anesthesia was also reviewed. The risks                            and benefits of the procedure and the sedation                            options and risks were discussed with the patient.                            All questions were answered, and informed consent                            was obtained. Prior Anticoagulants: The patient has                            taken no previous anticoagulant or antiplatelet                            agents. ASA Grade Assessment: II - A patient with                            mild systemic disease. After reviewing the risks                            and benefits, the patient was deemed in  satisfactory condition to undergo the procedure.                           After obtaining informed consent, the colonoscope                            was passed under direct vision. Throughout the                            procedure, the patient's blood pressure, pulse, and                            oxygen saturations were monitored continuously. The                            Colonoscope was  introduced through the anus and                            advanced to the the cecum, identified by                            appendiceal orifice and ileocecal valve. The                            colonoscopy was performed without difficulty. The                            patient tolerated the procedure well. The quality                            of the bowel preparation was good. The ileocecal                            valve, appendiceal orifice, and rectum were                            photographed. Scope In: 8:54:52 AM Scope Out: 9:05:28 AM Scope Withdrawal Time: 0 hours 8 minutes 18 seconds  Total Procedure Duration: 0 hours 10 minutes 36 seconds  Findings:                 The perianal and digital rectal examinations were                            normal.                           A few small-mouthed diverticula were found in the                            sigmoid colon and descending colon.                           A 3 mm polyp was found in the sigmoid colon located  36 cm from the anus. The polyp was flat. The polyp                            was removed with a cold snare. Resection and                            retrieval were complete. Estimated blood loss was                            minimal.                           The exam was otherwise without abnormality on                            direct and retroflexion views. Complications:            No immediate complications. Estimated blood loss:                            Minimal. Estimated Blood Loss:     Estimated blood loss: none. Impression:               - Diverticulosis in the sigmoid colon and in the                            descending colon.                           - One 3 mm polyp in the sigmoid colon, removed with                            a cold snare. Resected and retrieved.                           - The examination was otherwise normal on direct                             and retroflexion views. Recommendation:           - Patient has a contact number available for                            emergencies. The signs and symptoms of potential                            delayed complications were discussed with the                            patient. Return to normal activities tomorrow.                            Written discharge instructions were provided to the                            patient.                           -  Follow a high fiber diet. Drink at least 64                            ounces of water daily. Add a daily stool bulking                            agent such as psyllium (an exampled would be                            Metamucil).                           - Continue present medications.                           - Await pathology results.                           - Repeat colonoscopy date to be determined after                            pending pathology results are reviewed for                            surveillance.                           - Emerging evidence supports eating a diet of                            fruits, vegetables, grains, calcium, and yogurt                            while reducing red meat and alcohol may reduce the                            risk of colon cancer.                           - Thank you for allowing me to be involved in your                            colon cancer prevention. Thornton Park MD, MD 06/02/2019 9:16:04 AM This report has been signed electronically.

## 2019-06-02 NOTE — Progress Notes (Signed)
A/ox3, pleased with MAC, report to RN 

## 2019-06-02 NOTE — Progress Notes (Signed)
Radiation Oncology         [phone] ________________________________  Name: Shawntell Dixson        MRN: [id number]  Date of Service: 06/02/2019 DOB: 1960/01/22  BO:[id number], Chinita Pester, MD     REFERRING PHYSICIAN: Truitt Merle, MD   DIAGNOSIS: The encounter diagnosis was Malignant neoplasm of upper-outer quadrant of right breast in female, estrogen receptor positive ([place]).   HISTORY OF PRESENT ILLNESS: Grissel Tyrell is a 59 y.o. female originaly seen in the multidisciplinary breast clinic for a new diagnosis of right breast cancer. The patient was noted to have screening detected mass in the right breast. She proceeded with diagnostic imaging and this revealed a 9 mm mass at the 11 o'clock position by ultrasound.  Her axilla was negative.  While there was not an ultrasound correlate, diagnostic mammography also showed concerns for possible second mass.  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  Her tumor was triple positive with a Ki-67 of 20%. She proceeded with lumpectomy and sentinel node biopsy on 01/27/19 that revealed a grade 3, 1.3 cm invasive ductal carcinoma. Her 4 sampled nodes were negative for metastatic disease, and her margins were clear. She began chemotherapy on 02/23/19 and completed the abraxane component of chemotherapy on 05/11/19. She is seen to day to proceed with adjuvant radiotherapy.   PREVIOUS RADIATION THERAPY: No   PAST MEDICAL HISTORY:  Past Medical History:  Diagnosis Date  . Cancer [facility])    breast cancer  . Gastric ulcer   . Hyperlipidemia        PAST SURGICAL HISTORY: Past Surgical History:  Procedure Laterality Date  . BREAST LUMPECTOMY WITH RADIOACTIVE SEED AND SENTINEL LYMPH NODE BIOPSY Right 01/27/2019   Procedure: RIGHT BREAST LUMPECTOMY WITH RADIOACTIVE SEED AND SENTINEL LYMPH NODE MAPPING;  Surgeon: Erroll Luna, MD;  Location: [facility];  Service: General;  Laterality: Right;  . COSMETIC SURGERY Bilateral 2000   breast aug/ lipo  . PORTACATH PLACEMENT Right 01/27/2019   Procedure: INSERTION PORT-A-CATH WITH ULTRASOUND;  Surgeon: Erroll Luna, MD;  Location: [facility];  Service: General;  Laterality: Right;     FAMILY HISTORY:  Family History  Problem Relation Age of Onset  . Diabetes Mother   . Heart disease Mother   . Hyperlipidemia Mother   . Breast cancer Mother 60  . Cancer Mother        breast DCIS  . Cancer Father 61       prostate cancer   . Diabetes Father   . Breast cancer Sister 59  . Cancer Sister 37       breast cancer   . Cancer Maternal Aunt 55       breast cancer   . Cancer Maternal Grandfather        colon cancer  . Colon cancer Maternal Grandfather   . Esophageal cancer Neg Hx   . Rectal cancer Neg Hx   . Stomach cancer Neg Hx      SOCIAL HISTORY:  reports that she quit smoking about 41 years ago. She has a 1.25 pack-year smoking history. She has never used smokeless tobacco. She reports previous alcohol use of about 6.0 standard drinks of alcohol per week. She reports that she does not use drugs.  The patient is married and lives in [place].  She is a retired [employer] scientist.  She states  that she retired in January 2020, but worked many years on [employer].    ALLERGIES: Patient has no known allergies.   MEDICATIONS:  Current Outpatient Medications  Medication Sig Dispense Refill  . CALCIUM PO Take 1,000 mg by mouth daily.    . Magnesium 125 MG CAPS Take 1 capsule by mouth daily.     . Melatonin 3 MG CAPS Take 3 mg by mouth at bedtime.     . Multiple Vitamins-Minerals (MULTIVITAMIN WITH MINERALS) tablet Take 1 tablet by mouth daily.    . pantoprazole (PROTONIX) 40 MG tablet Take 1 tablet (40 mg total) by mouth 2 (two) times daily. 112 tablet 0  . traZODone (DESYREL) 50 MG tablet Take 1 tablet (50 mg total) by mouth at bedtime as needed  for sleep. 90 tablet 1  . VITAMIN D PO Take 12,000 Units by mouth daily.     No current facility-administered medications for this encounter.     REVIEW OF SYSTEMS: On review of systems, the patient reports that she is doing well overall. She reports she's starting to get her energy back since completing chemotherapy. She reports her neuropathy has resolved. She has noticed some redness since the surgery in her right breast more medially but this is not painful. It comes and goes and sometimes blanches. No other complaints are noted.   PHYSICAL EXAM:  Wt Readings from Last 3 Encounters:  06/02/19 177 lb (80.3 kg)  05/27/19 177 lb (80.3 kg)  05/11/19 183 lb 3.2 oz (83.1 kg)   Temp Readings from Last 3 Encounters:  06/02/19 (!) 96.2 F (35.7 C)  05/27/19 97.8 F (36.6 C)  05/18/19 98 F (36.7 C) (Temporal)   BP Readings from Last 3 Encounters:  06/02/19 137/61  05/18/19 118/85  05/11/19 129/83   Pulse Readings from Last 3 Encounters:  06/02/19 70  05/18/19 91  05/11/19 90    In general this is a well appearing Caucasian female in no acute distress.  She's alert and oriented x4 and appropriate throughout the examination. Cardiopulmonary assessment is negative for acute distress and she exhibits normal effort.  The right breast does have more erythema medially but it does not appear to be cellulitic in nature. The skin blanches but in lifting the breast, the medial areola does become hyperemic and blanches but is nontender.   ECOG = 0  0 - Asymptomatic (Fully active, able to carry on all predisease activities without restriction)  1 - Symptomatic but completely ambulatory (Restricted in physically strenuous activity but ambulatory and able to carry out work of a light or sedentary nature. For example, light housework, office work)  2 - Symptomatic, <50% in bed during the day (Ambulatory and capable of all self care but unable to carry out any work activities. Up and about more  than 50% of waking hours)  3 - Symptomatic, >50% in bed, but not bedbound (Capable of only limited self-care, confined to bed or chair 50% or more of waking hours)  4 - Bedbound (Completely disabled. Cannot carry on any self-care. Totally confined to bed or chair)  5 - Death   Eustace Pen MM, Creech RH, Tormey DC, et al. [phone]). "Toxicity and response criteria of the [facility]". [place] Oncol. 5 (6): 649-55    LABORATORY DATA:  Lab Results  Component Value Date   WBC 4.5 05/11/2019   HGB 11.1 (L) 05/11/2019   HCT 32.7 (L) 05/11/2019   MCV 90.3 05/11/2019  PLT 204 05/11/2019   Lab Results  Component Value Date   NA 140 05/11/2019   K 3.8 05/11/2019   CL 110 05/11/2019   CO2 23 05/11/2019   Lab Results  Component Value Date   ALT 126 (H) 05/11/2019   AST 51 (H) 05/11/2019   ALKPHOS 78 05/11/2019   BILITOT 0.4 05/11/2019      RADIOGRAPHY: No results found.     IMPRESSION/PLAN: 1. 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 Dr. Lisbeth Renshaw discusses the final pathology findings and reviews the nature of triple positive breast disease. She is doing well since surgery and has completed her systemic therapy with chemotherapy. She continues with antiHER2 therapy. We reviewed the rationale for adjuvant radiotherapy to the breast followed by antiestrogen therapy. We discussed the risks, benefits, short, and long term effects of radiotherapy, and the patient is interested in proceeding. Dr. Lisbeth Renshaw discusses the delivery and logistics of radiotherapy and anticipates a course of 6 1/2 weeks of radiotherapy due to her insitu implants. She will proceed today with the simulation process. Written consent is obtained and placed in the chart, a copy was provided to the patient. 2. Breast erythema. I believe her symptoms come from capillary engorgement either from her breast size with recent surgery or the combination thereof and  insitu implants. We will follow this expectantly but the patient is also contemplating reduction. She will discuss this further with Dr. Burr Medico and Dr. Brantley Stage in the future.     In a visit lasting 45 minutes, greater than 50% of the time was spent face to face discussing the patient's condition, in preparation for the discussion, and coordinating the patient's care.   The above documentation reflects my direct findings during this shared patient visit. Please see the separate note by Dr. Lisbeth Renshaw on this date for the remainder of the patient's plan of care.    Carola Rhine, PAC

## 2019-06-02 NOTE — Progress Notes (Signed)
Called to room to assist during endoscopic procedure.  Patient ID and intended procedure confirmed with present staff. Received instructions for my participation in the procedure from the performing physician.  

## 2019-06-02 NOTE — Addendum Note (Signed)
Encounter addended by: Cori Razor, RN on: 06/02/2019 3:08 PM  Actions taken: Charge Capture section accepted

## 2019-06-02 NOTE — Op Note (Addendum)
[place] Patient Name: Alicia Hahn Procedure Date: 06/02/2019 8:39 AM MRN: [id number] Endoscopist: Thornton Park MD, MD Age: 59 Referring MD:  Date of Birth: 20-May-1960 Gender: Female Account #: [id number] Procedure:                Upper GI endoscopy Indications:              Follow-up of acute gastric ulcer                           EGD 02/25/19 showed 5 gastric ulcers. Biopsies were                            negative for H pylori. Medicines:                Monitored Anesthesia Care Procedure:                Pre-Anesthesia Assessment:                           - Prior to the procedure, a History and Physical                            was performed, and patient medications and                            allergies were reviewed. The patient's tolerance of                            previous anesthesia was also reviewed. The risks                            and benefits of the procedure and the sedation                            options and risks were discussed with the patient.                            All questions were answered, and informed consent                            was obtained. Prior Anticoagulants: The patient has                            taken no previous anticoagulant or antiplatelet                            agents. ASA Grade Assessment: II - A patient with                            mild systemic disease. After reviewing the risks                            and benefits, the patient was deemed in  satisfactory condition to undergo the procedure.                           After obtaining informed consent, the endoscope was                            passed under direct vision. Throughout the                            procedure, the patient's blood pressure, pulse, and                            oxygen saturations were monitored continuously. The                            Endoscope was introduced through the mouth,  and                            advanced to the third part of duodenum. The upper                            GI endoscopy was accomplished without difficulty.                            The patient tolerated the procedure well. Scope In: Scope Out: Findings:                 The esophagus was normal.                           The stomach was normal.                           The examined duodenum was normal. Complications:            No immediate complications. Estimated Blood Loss:     Estimated blood loss: none. Impression:               - Normal esophagus.                           - Normal stomach.                           - Normal examined duodenum.                           - Resolution of prior gastric ulcers. Recommendation:           - Patient has a contact number available for                            emergencies. The signs and symptoms of potential                            delayed complications were discussed with the  patient. Return to normal activities tomorrow.                            Written discharge instructions were provided to the                            patient.                           - Resume previous diet.                           - Continue present medications. Reduce pantoprazole                            to every other day for one week. Then, discontinue                            pantoprazole. Resume at once daily dosing if                            symptoms return.                           - Proceed with colonoscopy as previously planned. Thornton Park MD, MD 06/02/2019 9:12:15 AM This report has been signed electronically.

## 2019-06-02 NOTE — Progress Notes (Signed)
Location of Breast Cancer: Upper Outer Quadrant of Right Breast +/+/ +  SAFETY ISSUES:  Prior radiation? No  Pacemaker/ICD? No  Possible current pregnancy? Postmenopausal  Is the patient on methotrexate? No  Current Complaints / other details:      Cori Razor, RN 06/02/2019,2:08 PM

## 2019-06-02 NOTE — Patient Instructions (Signed)
Reduce your pantoprazole to every other day for 1 week. The discontinue it.  Resume once daily if symptoms return.  Thank-you for choosing [place] for your healthcare needs today.  YOU HAD AN ENDOSCOPIC PROCEDURE TODAY AT [facility]:   Refer to the procedure report that was given to you for any specific questions about what was found during the examination.  If the procedure report does not answer your questions, please call your gastroenterologist to clarify.  If you requested that your care partner not be given the details of your procedure findings, then the procedure report has been included in a sealed envelope for you to review at your convenience later.  YOU SHOULD EXPECT: Some feelings of bloating in the abdomen. Passage of more gas than usual.  Walking can help get rid of the air that was put into your GI tract during the procedure and reduce the bloating. If you had a lower endoscopy (such as a colonoscopy or flexible sigmoidoscopy) you may notice spotting of blood in your stool or on the toilet paper. If you underwent a bowel prep for your procedure, you may not have a normal bowel movement for a few days.  Please Note:  You might notice some irritation and congestion in your nose or some drainage.  This is from the oxygen used during your procedure.  There is no need for concern and it should clear up in a day or so.  SYMPTOMS TO REPORT IMMEDIATELY:   Following lower endoscopy (colonoscopy or flexible sigmoidoscopy):  Excessive amounts of blood in the stool  Significant tenderness or worsening of abdominal pains  Swelling of the abdomen that is new, acute  Fever of 100F or higher   Following upper endoscopy (EGD)  Vomiting of blood or coffee ground material  New chest pain or pain under the shoulder blades  Painful or persistently difficult swallowing  New shortness of breath  Fever of 100F or higher  Black, tarry-looking stools  For urgent or emergent issues, a  gastroenterologist can be reached at any hour by calling [phone]. Do not use MyChart messaging for urgent concerns.    DIET:  We do recommend a small meal at first, but then you may proceed to your regular diet.  Drink plenty of fluids but you should avoid alcoholic beverages for 24 hours. Try to increase the fiber in your diet, and drink plenty of water.  Try to eat a high fiber diet, and drink plenty of water.  Reduce your red meat intake and drink alcohol in moderation.  ACTIVITY:  You should plan to take it easy for the rest of today and you should NOT DRIVE or use heavy machinery until tomorrow (because of the sedation medicines used during the test).    FOLLOW UP: Our staff will call the number listed on your records 48-72 hours following your procedure to check on you and address any questions or concerns that you may have regarding the information given to you following your procedure. If we do not reach you, we will leave a message.  We will attempt to reach you two times.  During this call, we will ask if you have developed any symptoms of COVID 19. If you develop any symptoms (ie: fever, flu-like symptoms, shortness of breath, cough etc.) before then, please call [phone].  If you test positive for Covid 19 in the 2 weeks post procedure, please call and report this information to [place].    If any biopsies  were taken you will be contacted by phone or by letter within the next 1-3 weeks.  Please call us at [phone] if you have not heard about the biopsies in 3 weeks.    SIGNATURES/CONFIDENTIALITY: You and/or your care partner have signed paperwork which will be entered into your electronic medical record.  These signatures attest to the fact that that the information above on your After Visit Summary has been reviewed and is understood.  Full responsibility of the confidentiality of this discharge information lies with you and/or your care-partner.

## 2019-06-06 ENCOUNTER — Telehealth: Payer: Self-pay

## 2019-06-06 NOTE — Progress Notes (Signed)
[place]   [phone]) [phone] [phone]   Clinic Follow up Note   Patient Care Team: Lorrene Reid, PA-C as PCP - General Mcarthur Rossetti, MD as Consulting Physician (Orthopedic Surgery) Molli Posey, MD as Consulting Physician (Obstetrics and Gynecology) Erroll Luna, MD as Consulting Physician (General Surgery) Truitt Merle, MD as Consulting Physician (Hematology) Kyung Rudd, MD as Consulting Physician (Radiation Oncology) Mauro Kaufmann, RN as Oncology Nurse Navigator Rockwell Germany, RN as Oncology Nurse Navigator  Date of Service:  06/08/2019  CHIEF COMPLAINT: Follow-up right breast cancer  SUMMARY OF ONCOLOGIC HISTORY: Oncology History Overview Note  Cancer Staging Malignant neoplasm of upper-outer quadrant of right breast in female, estrogen receptor positive ([place]) Staging form: Breast, AJCC 8th Edition - Clinical stage from 12/29/2018: Stage IA (cT1b, cN0, cM0, G2, ER+, PR+, HER2+) - Unsigned - Pathologic stage from 01/27/2019: Stage IA (pT1c, pN0, cM0, G3, ER+, PR+, HER2+) - Signed by Truitt Merle, MD on 02/06/2019 Cancer Staging No matching staging information was found for the patient.    Malignant neoplasm of upper-outer quadrant of right breast in female, estrogen receptor positive ([place])  12/22/2018 Mammogram    Diagnostic Mammogram 12/22/18  IMPRESSION: 1. 9 mm mass, with associated calcifications and architectural distortion, in the 11 o'clock position of the right breast, highly suspicious for breast carcinoma. 2. Questionable mass noted in the inferior aspect of the right breast remains equivocal on diagnostic imaging may reflect focal fibroglandular tissue. There is no sonographic correlate.   12/23/2018 Initial Biopsy   Diagnosis 12/23/18 Breast, right, needle core biopsy, 11 o'clock position - INVASIVE DUCTAL CARCINOMA, SEE COMMENT. - DUCTAL CARCINOMA IN SITU WITH NECROSIS.   12/28/2018 Initial Diagnosis   Malignant neoplasm of upper-outer quadrant of right breast in female, estrogen receptor positive ([place])   01/13/2019 Imaging   MRI Breast 01/13/19  IMPRESSION: 1. Biopsy proven malignancy in the upper-outer right breast. No additional suspicious findings on the right. 2. No MRI evidence of malignancy on the left. 3. No suspicious lymphadenopathy.   01/27/2019 Surgery   RIGHT BREAST LUMPECTOMY WITH RADIOACTIVE SEED AND SENTINEL LYMPH NODE MAPPING and PAC placement by Dr Brantley Stage  01/27/19   01/27/2019 Pathology Results   FINAL MICROSCOPIC DIAGNOSIS: 01/27/19  A. BREAST, RIGHT, LUMPECTOMY:  - Invasive ductal carcinoma, grade 3, spanning 1.3 cm.  - High grade ductal carcinoma in situ with necrosis.  - Biopsy site.  - Final resection margins are negative for carcinoma.  - See oncology table.   B. BREAST, RIGHT ADDITIONAL SUPERIOR MARGIN, EXCISION:  - Fibrocystic change and adenosis.  - No malignancy identified.   C. LYMPH NODE, RIGHT #1, SENTINEL, BIOPSY:  - One of one lymph nodes negative for carcinoma (0/1).   D. LYMPH NODE, RIGHT, SENTINEL, BIOPSY:  - One of one lymph nodes negative for carcinoma (0/1).   E. LYMPH NODE, RIGHT, SENTINEL, BIOPSY:  - One of one lymph nodes negative for carcinoma (0/1).   F. LYMPH NODE, RIGHT, SENTINEL, BIOPSY:  - One of one lymph nodes negative for carcinoma (0/1).       01/27/2019 Cancer Staging   Staging form: Breast, AJCC 8th Edition - Pathologic stage from 01/27/2019: Stage IA (pT1c, pN0, cM0, G3, ER+, PR+, HER2+) - Signed by Truitt Merle, MD on 02/06/2019   02/08/2019 Imaging   CT AP W Contrast 02/08/19  IMPRESSION: 1. Wall thickening of the gastric antrum may represent gastritis.   Aortic Atherosclerosis (ICD10-I70.0).   02/23/2019 -  Chemotherapy  Adjuvant weekly Abraxane with Herceptin q3weeks starting 02/23/19. Completed 12 week of Abraxane on 05/11/19. Maintenance Herceptin q3weeks start 05/18/19. Her insurance denied the injections.     06/13/2019 -  Radiation Therapy   Radiation with Dr Lisbeth Renshaw starting 06/13/19      CURRENT THERAPY:  -Adjuvant weekly Abraxane with Herceptin q3weeks starting2/3/21. Completed 12 week of Abraxane on 05/11/19. Maintenance Herceptin q3weeks start 05/18/19.  -Radiation starting 06/13/19   INTERVAL HISTORY:  Alicia Hahn is here for a follow up and treatment. She presents to the clinic alone. She notes she feels much better after completing chemo. She is able to walk 1 mile on a treadmill. She notes her edema has improved overall ans has very mild residual numbness of her fingers. She notes her rib pain improved as well.     REVIEW OF SYSTEMS:   Constitutional: Denies fevers, chills or abnormal weight loss (+) Improved energy  Eyes: Denies blurriness of vision Ears, nose, mouth, throat, and face: Denies mucositis or sore throat Respiratory: Denies cough, dyspnea or wheezes Cardiovascular: Denies palpitation, chest discomfort or lower extremity swelling Gastrointestinal:  Denies nausea, heartburn or change in bowel habits Skin: Denies abnormal skin rashes Lymphatics: Denies new lymphadenopathy or easy bruising Neurological: (+) residual very mild numbness of fingers.  Behavioral/Psych: Mood is stable, no new changes  All other systems were reviewed with the patient and are negative.  MEDICAL HISTORY:  Past Medical History:  Diagnosis Date  . Cancer [facility])    breast cancer  . Gastric ulcer   . Hyperlipidemia     SURGICAL HISTORY: Past Surgical History:  Procedure Laterality Date  . BREAST LUMPECTOMY WITH RADIOACTIVE SEED AND SENTINEL LYMPH NODE BIOPSY Right 01/27/2019   Procedure: RIGHT BREAST LUMPECTOMY WITH RADIOACTIVE SEED AND SENTINEL LYMPH NODE MAPPING;  Surgeon: Erroll Luna, MD;  Location: [place];  Service: General;  Laterality: Right;  . COSMETIC SURGERY Bilateral 2000   breast aug/ lipo  . PORTACATH PLACEMENT Right 01/27/2019   Procedure: INSERTION  PORT-A-CATH WITH ULTRASOUND;  Surgeon: Erroll Luna, MD;  Location: [place];  Service: General;  Laterality: Right;    I have reviewed the social history and family history with the patient and they are unchanged from previous note.  ALLERGIES:  has No Known Allergies.  MEDICATIONS:  Current Outpatient Medications  Medication Sig Dispense Refill  . CALCIUM PO Take 1,000 mg by mouth daily.    . Magnesium 125 MG CAPS Take 1 capsule by mouth daily.     . Melatonin 3 MG CAPS Take 3 mg by mouth at bedtime.     . Multiple Vitamins-Minerals (MULTIVITAMIN WITH MINERALS) tablet Take 1 tablet by mouth daily.    . pantoprazole (PROTONIX) 40 MG tablet Take 1 tablet (40 mg total) by mouth 2 (two) times daily. (Patient taking differently: Take 40 mg by mouth every other day. ) 112 tablet 0  . traZODone (DESYREL) 50 MG tablet Take 1 tablet (50 mg total) by mouth at bedtime as needed for sleep. 90 tablet 1  . VITAMIN D PO Take 12,000 Units by mouth daily.     No current facility-administered medications for this visit.    PHYSICAL EXAMINATION: ECOG PERFORMANCE STATUS: 1 - Symptomatic but completely ambulatory Blood pressure 160/75, pulse 73, respirate is 18, temperature 36.7, pulse ox 100% on room air GENERAL:alert, no distress and comfortable SKIN: skin color, texture, turgor are normal, no rashes or significant lesions EYES: normal, Conjunctiva are pink and non-injected,  sclera clear NEURO: alert & oriented x 3 with fluent speech, no focal motor/sensory deficits  LABORATORY DATA:  I have reviewed the data as listed CBC Latest Ref Rng & Units 06/08/2019 05/11/2019 05/04/2019  WBC 4.0 - 10.5 K/uL 5.8 4.5 3.7(L)  Hemoglobin 12.0 - 15.0 g/dL 14.0 11.1(L) 11.0(L)  Hematocrit 36.0 - 46.0 % 42.8 32.7(L) 32.4(L)  Platelets 150 - 400 K/uL 203 204 203     CMP Latest Ref Rng & Units 06/08/2019 05/11/2019 05/04/2019  Glucose 70 - 99 mg/dL 95 130(H) 123(H)  BUN 8 - 23 mg/dL '16 16 15   '$ Creatinine 0.44 - 1.00 mg/dL 0.90 0.84 0.83  Sodium 135 - 145 mmol/L 142 140 142  Potassium 3.5 - 5.1 mmol/L 4.4 3.8 3.9  Chloride 98 - 111 mmol/L 107 110 110  CO2 22 - 32 mmol/L '25 23 26  '$ Calcium 8.9 - 10.3 mg/dL 9.6 8.4(L) 8.7(L)  Total Protein 6.5 - 8.1 g/dL 6.9 5.7(L) 6.1(L)  Total Bilirubin 0.3 - 1.2 mg/dL 0.3 0.4 0.4  Alkaline Phos 38 - 126 U/L 82 78 83  AST 15 - 41 U/L 64(H) 51(H) 80(H)  ALT 0 - 44 U/L 134(H) 126(H) 180(H)      RADIOGRAPHIC STUDIES: I have personally reviewed the radiological images as listed and agreed with the findings in the report. No results found.   ASSESSMENT & PLAN:  Alicia Hahn is a 59 y.o. female with    1.Malignant neoplasm of upper-outer quadrant of right breast,invasive ductal carcinoma,pT1cN0M0,stageIA,Triple Positive,Grade3 -She was recently diagnosed in 12/2018.She has 90m mass of right breast with invasive ductal carcinoma and components of DCIS.She underwent right lumpectomy and SLNB on 01/27/19 by Dr CBrantley Stage  -Given the size of her tumor and HER2 positive disease she has higher risk of recurrence. Istarted her onadjuvant chemotherapy with Weekly Abraxane and Herceptin q3weeks for 12 weeks2/3/21-4/21/21. She started maintenance Herceptin to complete 1 year of treatment on 05/18/19. Her insurance denied the injections.   -She has recovered from chemo well with near resolved fatigue and body pain. She has residual very mild numbness of fingers.  -Labs reviewed and adequate to proceed with Herceptin today and continue every 3 weeks.  -To reduce her risk of local recurrence she will proceed with adjuvant RT with Dr MLisbeth Renshawstarting on 06/13/19. Plan to start antiestrogen therapy after RT.  -F/u in 9 weeks    2. Dyspnea on exertion, Neuropathy and weakness.  -secondary to Abraxane s/p C11 she has developed bilateral upper extremity weakness and Neuropathy, L>R.  -Will dose reduce last cycle Abraxane.  -I discussed her  neuropathy will take time to resolve.    3. Ribcage tightness -She notes band of chest tightness around her ribcage which she has had but recently increased along with her SOB now with any activity.  -Right ribcage tenderness under breast on today's exam (05/11/19). She also has very mild right breast lymphedema which I feel is related. She has completed PT.  -Her pain has been mild, not on OTC pain meds -Will monitor. Has much improved off chemo, will likely resolve soon.   3. Gastric ulcers  -After surgery she took Motrin and started having severe epigastric pain and required ED visit.  -Her 02/25/19 Endoscopy with Dr. BTarri Glennshowed 5 small gastric ulcers, benign. She is currently on Protonix and will continues until she completes chemo. Will repeat endoscopy with Dr. BEnis GashMay, 2021.She can request colonoscopy to be done same day. -Pain is manageable on ProtonixBID.  4.  Estrogen replacement andpostmenopausal vaginalbleeding  -Started after 2nd Mirena was taken out. Her Gyn put her on a third Mirena and bleeding stopped.  Vernia Buff estrogen replacement for several years for hot flashes. By her GYN. -Given her age she is likely postmenopausal and her given ER/PR positive breast cancer Ipreviouslyrecommendedshe take her Mirena out. If she has further vaginal bleeding she should undergo workup for this with her Gyn.   5. Genetics  -Based on family history her father has prostate cancer and she had significant family history of breast cancer. She is eligible for genetic testing.  -Pt notes she had genetic testing before at [facility]. Will obtain records.  6. Transaminitis -she was noticed to have ALT 65 before she started chemo, which was normal one month before.  Transaminitis got worse after she started chemo.  Likely related to chemo, also possible related to Lipitor which started a few months ago -Her LFTs did improve after stopping lipitor. -Has improved  this week.  -continue monitoring    PLAN: -Labs reviewed and adequate to proceed with Herceptin today  -Proceed with RT starting 5/24 -Herceptin in 3, 6, 9 weeks  -Lab and F/u in 9 weeks, plan to start antiestrogen therapy after next visit  -She is scheduled to see Dr. Haroldine Laws and repeat echocardiogram in June   No problem-specific [place] notes found for this encounter.   No orders of the defined types were placed in this encounter.  All questions were answered. The patient knows to call the clinic with any problems, questions or concerns. No barriers to learning was detected. The total time spent in the appointment was 30 minutes.     Truitt Merle, MD 06/08/2019   I, Joslyn Devon, am acting as scribe for Truitt Merle, MD.   I have reviewed the above documentation for accuracy and completeness, and I agree with the above.

## 2019-06-06 NOTE — Telephone Encounter (Signed)
  Follow up Call-  Call back number 06/02/2019 02/25/2019  Post procedure Call Back phone  # [phone] -  Permission to leave phone message Yes Yes  Some recent data might be hidden     Patient questions:  Do you have a fever, pain , or abdominal swelling? No. Pain Score  0 *  Have you tolerated food without any problems? Yes.    Have you been able to return to your normal activities? Yes.    Do you have any questions about your discharge instructions: Diet   No. Medications  No. Follow up visit  No.  Do you have questions or concerns about your Care? No.  Actions: * If pain score is 4 or above: No action needed, pain <4.  1. Have you developed a fever since your procedure? no  2.   Have you had an respiratory symptoms (SOB or cough) since your procedure? no  3.   Have you tested positive for COVID 19 since your procedure no  4.   Have you had any family members/close contacts diagnosed with the COVID 19 since your procedure? no   If yes to any of these questions please route to Joylene John, RN and Erenest Rasher, RN

## 2019-06-07 ENCOUNTER — Encounter: Payer: Self-pay | Admitting: Gastroenterology

## 2019-06-08 ENCOUNTER — Inpatient Hospital Stay: Payer: 59

## 2019-06-08 ENCOUNTER — Inpatient Hospital Stay: Payer: 59 | Attending: Hematology

## 2019-06-08 ENCOUNTER — Other Ambulatory Visit: Payer: Self-pay

## 2019-06-08 ENCOUNTER — Inpatient Hospital Stay (HOSPITAL_BASED_OUTPATIENT_CLINIC_OR_DEPARTMENT_OTHER): Payer: 59 | Admitting: Hematology

## 2019-06-08 ENCOUNTER — Encounter: Payer: Self-pay | Admitting: Hematology

## 2019-06-08 VITALS — BP 116/75 | HR 73 | Temp 98.1°F | Resp 18

## 2019-06-08 DIAGNOSIS — Z5189 Encounter for other specified aftercare: Secondary | ICD-10-CM | POA: Diagnosis not present

## 2019-06-08 DIAGNOSIS — C50411 Malignant neoplasm of upper-outer quadrant of right female breast: Secondary | ICD-10-CM

## 2019-06-08 DIAGNOSIS — Z5112 Encounter for antineoplastic immunotherapy: Secondary | ICD-10-CM | POA: Insufficient documentation

## 2019-06-08 DIAGNOSIS — Z170 Estrogen receptor positive status [ER+]: Secondary | ICD-10-CM

## 2019-06-08 DIAGNOSIS — Z95828 Presence of other vascular implants and grafts: Secondary | ICD-10-CM

## 2019-06-08 DIAGNOSIS — Z79899 Other long term (current) drug therapy: Secondary | ICD-10-CM | POA: Insufficient documentation

## 2019-06-08 LAB — CBC WITH DIFFERENTIAL (CANCER CENTER ONLY)
Abs Immature Granulocytes: 0.01 10*3/uL (ref 0.00–0.07)
Basophils Absolute: 0.1 10*3/uL (ref 0.0–0.1)
Basophils Relative: 1 %
Eosinophils Absolute: 0.2 10*3/uL (ref 0.0–0.5)
Eosinophils Relative: 4 %
HCT: 42.8 % (ref 36.0–46.0)
Hemoglobin: 14 g/dL (ref 12.0–15.0)
Immature Granulocytes: 0 %
Lymphocytes Relative: 33 %
Lymphs Abs: 1.9 10*3/uL (ref 0.7–4.0)
MCH: 30.6 pg (ref 26.0–34.0)
MCHC: 32.7 g/dL (ref 30.0–36.0)
MCV: 93.4 fL (ref 80.0–100.0)
Monocytes Absolute: 0.7 10*3/uL (ref 0.1–1.0)
Monocytes Relative: 12 %
Neutro Abs: 2.9 10*3/uL (ref 1.7–7.7)
Neutrophils Relative %: 50 %
Platelet Count: 203 10*3/uL (ref 150–400)
RBC: 4.58 MIL/uL (ref 3.87–5.11)
RDW: 13.7 % (ref 11.5–15.5)
WBC Count: 5.8 10*3/uL (ref 4.0–10.5)
nRBC: 0 % (ref 0.0–0.2)

## 2019-06-08 LAB — CMP (CANCER CENTER ONLY)
ALT: 134 U/L — ABNORMAL HIGH (ref 0–44)
AST: 64 U/L — ABNORMAL HIGH (ref 15–41)
Albumin: 3.8 g/dL (ref 3.5–5.0)
Alkaline Phosphatase: 82 U/L (ref 38–126)
Anion gap: 10 (ref 5–15)
BUN: 16 mg/dL (ref 8–23)
CO2: 25 mmol/L (ref 22–32)
Calcium: 9.6 mg/dL (ref 8.9–10.3)
Chloride: 107 mmol/L (ref 98–111)
Creatinine: 0.9 mg/dL (ref 0.44–1.00)
GFR, Est AFR Am: >60 mL/min (ref >60)
GFR, Estimated: >60 mL/min (ref >60)
Glucose, Bld: 95 mg/dL (ref 70–99)
Potassium: 4.4 mmol/L (ref 3.5–5.1)
Sodium: 142 mmol/L (ref 135–145)
Total Bilirubin: 0.3 mg/dL (ref 0.3–1.2)
Total Protein: 6.9 g/dL (ref 6.5–8.1)

## 2019-06-08 MED ORDER — HEPARIN SOD (PORK) LOCK FLUSH 100 UNIT/ML IV SOLN
500.0000 [IU] | Freq: Once | INTRAVENOUS | Status: AC | PRN
  Administered 2019-06-08: 500 [IU]
  Filled 2019-06-08: qty 5

## 2019-06-08 MED ORDER — SODIUM CHLORIDE 0.9% FLUSH
10.0000 mL | Freq: Once | INTRAVENOUS | Status: AC
  Administered 2019-06-08: 10 mL
  Filled 2019-06-08: qty 10

## 2019-06-08 MED ORDER — ACETAMINOPHEN 325 MG PO TABS
ORAL_TABLET | ORAL | Status: AC
  Filled 2019-06-08: qty 2

## 2019-06-08 MED ORDER — ALTEPLASE 2 MG IJ SOLR
2.0000 mg | Freq: Once | INTRAMUSCULAR | Status: AC
  Administered 2019-06-08: 2 mg
  Filled 2019-06-08: qty 2

## 2019-06-08 MED ORDER — ALTEPLASE 2 MG IJ SOLR
INTRAMUSCULAR | Status: AC
  Filled 2019-06-08: qty 2

## 2019-06-08 MED ORDER — SODIUM CHLORIDE 0.9 % IV SOLN
Freq: Once | INTRAVENOUS | Status: AC
  Filled 2019-06-08: qty 250

## 2019-06-08 MED ORDER — TRASTUZUMAB-ANNS CHEMO 150 MG IV SOLR
450.0000 mg | Freq: Once | INTRAVENOUS | Status: AC
  Administered 2019-06-08: 450 mg via INTRAVENOUS
  Filled 2019-06-08: qty 21.43

## 2019-06-08 MED ORDER — DIPHENHYDRAMINE HCL 25 MG PO CAPS
25.0000 mg | ORAL_CAPSULE | Freq: Once | ORAL | Status: AC
  Administered 2019-06-08: 25 mg via ORAL

## 2019-06-08 MED ORDER — ACETAMINOPHEN 325 MG PO TABS
650.0000 mg | ORAL_TABLET | Freq: Once | ORAL | Status: AC
  Administered 2019-06-08: 650 mg via ORAL

## 2019-06-08 MED ORDER — SODIUM CHLORIDE 0.9% FLUSH
10.0000 mL | INTRAVENOUS | Status: DC | PRN
  Administered 2019-06-08: 10 mL
  Filled 2019-06-08: qty 10

## 2019-06-08 MED ORDER — DIPHENHYDRAMINE HCL 25 MG PO CAPS
ORAL_CAPSULE | ORAL | Status: AC
  Filled 2019-06-08: qty 1

## 2019-06-08 NOTE — Patient Instructions (Signed)
[facility] °Discharge Instructions for Patients Receiving Chemotherapy ° °Today you received the following chemotherapy agents Trastuzumab ° °To help prevent nausea and vomiting after your treatment, we encourage you to take your nausea medication as directed. °  °If you develop nausea and vomiting that is not controlled by your nausea medication, call the clinic.  ° °BELOW ARE SYMPTOMS THAT SHOULD BE REPORTED IMMEDIATELY: °· *FEVER GREATER THAN 100.5 F °· *CHILLS WITH OR WITHOUT FEVER °· NAUSEA AND VOMITING THAT IS NOT CONTROLLED WITH YOUR NAUSEA MEDICATION °· *UNUSUAL SHORTNESS OF BREATH °· *UNUSUAL BRUISING OR BLEEDING °· TENDERNESS IN MOUTH AND THROAT WITH OR WITHOUT PRESENCE OF ULCERS °· *URINARY PROBLEMS °· *BOWEL PROBLEMS °· UNUSUAL RASH °Items with * indicate a potential emergency and should be followed up as soon as possible. ° °Feel free to call the clinic should you have any questions or concerns. The clinic phone number is [phone]. ° °Please show the CHEMO ALERT CARD at check-in to the Emergency Department and triage nurse. ° ° °

## 2019-06-09 ENCOUNTER — Telehealth: Payer: Self-pay | Admitting: Hematology

## 2019-06-09 ENCOUNTER — Ambulatory Visit: Payer: 59 | Admitting: Radiation Oncology

## 2019-06-09 NOTE — Telephone Encounter (Signed)
Scheduled appt per 5/19 los.  Spoke with pt and she is aware of her scheduled appt dates and time.

## 2019-06-10 ENCOUNTER — Ambulatory Visit: Payer: 59

## 2019-06-10 DIAGNOSIS — Z510 Encounter for antineoplastic radiation therapy: Secondary | ICD-10-CM | POA: Diagnosis not present

## 2019-06-13 ENCOUNTER — Other Ambulatory Visit: Payer: Self-pay

## 2019-06-13 ENCOUNTER — Ambulatory Visit
Admission: RE | Admit: 2019-06-13 | Discharge: 2019-06-13 | Disposition: A | Discharge status: Home / Self Care | Payer: 59 | Source: Ambulatory Visit | Attending: Radiation Oncology | Admitting: Radiation Oncology

## 2019-06-13 DIAGNOSIS — Z510 Encounter for antineoplastic radiation therapy: Secondary | ICD-10-CM | POA: Diagnosis not present

## 2019-06-14 ENCOUNTER — Ambulatory Visit
Admission: RE | Admit: 2019-06-14 | Discharge: 2019-06-14 | Disposition: A | Discharge status: Home / Self Care | Payer: 59 | Source: Ambulatory Visit | Attending: Radiation Oncology | Admitting: Radiation Oncology

## 2019-06-14 ENCOUNTER — Other Ambulatory Visit: Payer: Self-pay

## 2019-06-14 DIAGNOSIS — Z510 Encounter for antineoplastic radiation therapy: Secondary | ICD-10-CM | POA: Diagnosis not present

## 2019-06-15 ENCOUNTER — Ambulatory Visit
Admission: RE | Admit: 2019-06-15 | Discharge: 2019-06-15 | Disposition: A | Discharge status: Home / Self Care | Payer: 59 | Source: Ambulatory Visit | Attending: Radiation Oncology | Admitting: Radiation Oncology

## 2019-06-15 ENCOUNTER — Other Ambulatory Visit: Payer: Self-pay

## 2019-06-15 DIAGNOSIS — Z510 Encounter for antineoplastic radiation therapy: Secondary | ICD-10-CM | POA: Diagnosis not present

## 2019-06-16 ENCOUNTER — Other Ambulatory Visit: Payer: Self-pay

## 2019-06-16 ENCOUNTER — Ambulatory Visit
Admission: RE | Admit: 2019-06-16 | Discharge: 2019-06-16 | Disposition: A | Discharge status: Home / Self Care | Payer: 59 | Source: Ambulatory Visit | Attending: Radiation Oncology | Admitting: Radiation Oncology

## 2019-06-16 DIAGNOSIS — Z510 Encounter for antineoplastic radiation therapy: Secondary | ICD-10-CM | POA: Diagnosis not present

## 2019-06-16 NOTE — Progress Notes (Signed)
Pt here for patient teaching.  Pt given Radiation and You booklet, skin care instructions, Alra deodorant and Sonafine.  Reviewed areas of pertinence such as fatigue, hair loss, skin changes, breast tenderness and breast swelling . Pt able to give teach back of to pat skin and use unscented/gentle soap,apply Sonafine bid, avoid applying anything to skin within 4 hours of treatment, avoid wearing an under wire bra and to use an electric razor if they must shave. Pt verbalizes understanding of information given and will contact nursing with any questions or concerns.     Naraya Stoneberg M. Dravon Nott RN, BSN             

## 2019-06-17 ENCOUNTER — Other Ambulatory Visit: Payer: Self-pay

## 2019-06-17 ENCOUNTER — Ambulatory Visit
Admission: RE | Admit: 2019-06-17 | Discharge: 2019-06-17 | Disposition: A | Discharge status: Home / Self Care | Payer: 59 | Source: Ambulatory Visit | Attending: Radiation Oncology | Admitting: Radiation Oncology

## 2019-06-17 DIAGNOSIS — C50411 Malignant neoplasm of upper-outer quadrant of right female breast: Secondary | ICD-10-CM

## 2019-06-17 DIAGNOSIS — Z510 Encounter for antineoplastic radiation therapy: Secondary | ICD-10-CM | POA: Diagnosis not present

## 2019-06-17 DIAGNOSIS — Z170 Estrogen receptor positive status [ER+]: Secondary | ICD-10-CM

## 2019-06-17 MED ORDER — SONAFINE EX EMUL
1.0000 "application " | Freq: Once | CUTANEOUS | Status: AC
  Administered 2019-06-17: 1 via TOPICAL

## 2019-06-17 MED ORDER — ALRA NON-METALLIC DEODORANT (RAD-ONC)
1.0000 "application " | Freq: Once | TOPICAL | Status: AC
  Administered 2019-06-17: 1 via TOPICAL

## 2019-06-21 ENCOUNTER — Ambulatory Visit
Admission: RE | Admit: 2019-06-21 | Discharge: 2019-06-21 | Disposition: A | Discharge status: Home / Self Care | Payer: 59 | Source: Ambulatory Visit | Attending: Radiation Oncology | Admitting: Radiation Oncology

## 2019-06-21 ENCOUNTER — Other Ambulatory Visit: Payer: Self-pay

## 2019-06-21 DIAGNOSIS — Z510 Encounter for antineoplastic radiation therapy: Secondary | ICD-10-CM | POA: Insufficient documentation

## 2019-06-21 DIAGNOSIS — C50411 Malignant neoplasm of upper-outer quadrant of right female breast: Secondary | ICD-10-CM | POA: Diagnosis present

## 2019-06-22 ENCOUNTER — Ambulatory Visit
Admission: RE | Admit: 2019-06-22 | Discharge: 2019-06-22 | Disposition: A | Discharge status: Home / Self Care | Payer: 59 | Source: Ambulatory Visit | Attending: Radiation Oncology | Admitting: Radiation Oncology

## 2019-06-22 ENCOUNTER — Other Ambulatory Visit: Payer: Self-pay

## 2019-06-22 DIAGNOSIS — Z510 Encounter for antineoplastic radiation therapy: Secondary | ICD-10-CM | POA: Diagnosis not present

## 2019-06-23 ENCOUNTER — Ambulatory Visit
Admission: RE | Admit: 2019-06-23 | Discharge: 2019-06-23 | Disposition: A | Discharge status: Home / Self Care | Payer: 59 | Source: Ambulatory Visit | Attending: Radiation Oncology | Admitting: Radiation Oncology

## 2019-06-23 ENCOUNTER — Other Ambulatory Visit: Payer: Self-pay

## 2019-06-23 DIAGNOSIS — Z510 Encounter for antineoplastic radiation therapy: Secondary | ICD-10-CM | POA: Diagnosis not present

## 2019-06-24 ENCOUNTER — Other Ambulatory Visit: Payer: Self-pay

## 2019-06-24 ENCOUNTER — Ambulatory Visit
Admission: RE | Admit: 2019-06-24 | Discharge: 2019-06-24 | Disposition: A | Discharge status: Home / Self Care | Payer: 59 | Source: Ambulatory Visit | Attending: Radiation Oncology | Admitting: Radiation Oncology

## 2019-06-24 DIAGNOSIS — Z510 Encounter for antineoplastic radiation therapy: Secondary | ICD-10-CM | POA: Diagnosis not present

## 2019-06-27 ENCOUNTER — Other Ambulatory Visit: Payer: Self-pay

## 2019-06-27 ENCOUNTER — Ambulatory Visit
Admission: RE | Admit: 2019-06-27 | Discharge: 2019-06-27 | Disposition: A | Discharge status: Home / Self Care | Payer: 59 | Source: Ambulatory Visit | Attending: Radiation Oncology | Admitting: Radiation Oncology

## 2019-06-27 DIAGNOSIS — Z510 Encounter for antineoplastic radiation therapy: Secondary | ICD-10-CM | POA: Diagnosis not present

## 2019-06-28 ENCOUNTER — Other Ambulatory Visit: Payer: Self-pay

## 2019-06-28 ENCOUNTER — Ambulatory Visit
Admission: RE | Admit: 2019-06-28 | Discharge: 2019-06-28 | Disposition: A | Discharge status: Home / Self Care | Payer: 59 | Source: Ambulatory Visit | Attending: Radiation Oncology | Admitting: Radiation Oncology

## 2019-06-28 DIAGNOSIS — Z510 Encounter for antineoplastic radiation therapy: Secondary | ICD-10-CM | POA: Diagnosis not present

## 2019-06-29 ENCOUNTER — Other Ambulatory Visit: Payer: Self-pay

## 2019-06-29 ENCOUNTER — Inpatient Hospital Stay: Payer: 59 | Attending: Hematology

## 2019-06-29 ENCOUNTER — Ambulatory Visit
Admission: RE | Admit: 2019-06-29 | Discharge: 2019-06-29 | Disposition: A | Discharge status: Home / Self Care | Payer: 59 | Source: Ambulatory Visit | Attending: Radiation Oncology | Admitting: Radiation Oncology

## 2019-06-29 VITALS — BP 121/69 | HR 78 | Temp 98.2°F | Resp 18

## 2019-06-29 DIAGNOSIS — C50411 Malignant neoplasm of upper-outer quadrant of right female breast: Secondary | ICD-10-CM | POA: Diagnosis present

## 2019-06-29 DIAGNOSIS — Z5112 Encounter for antineoplastic immunotherapy: Secondary | ICD-10-CM | POA: Diagnosis present

## 2019-06-29 DIAGNOSIS — Z170 Estrogen receptor positive status [ER+]: Secondary | ICD-10-CM | POA: Diagnosis not present

## 2019-06-29 DIAGNOSIS — Z510 Encounter for antineoplastic radiation therapy: Secondary | ICD-10-CM | POA: Diagnosis not present

## 2019-06-29 MED ORDER — ACETAMINOPHEN 325 MG PO TABS
650.0000 mg | ORAL_TABLET | Freq: Once | ORAL | Status: AC
  Administered 2019-06-29: 650 mg via ORAL

## 2019-06-29 MED ORDER — ACETAMINOPHEN 325 MG PO TABS
ORAL_TABLET | ORAL | Status: AC
  Filled 2019-06-29: qty 2

## 2019-06-29 MED ORDER — DIPHENHYDRAMINE HCL 25 MG PO CAPS
ORAL_CAPSULE | ORAL | Status: AC
  Filled 2019-06-29: qty 1

## 2019-06-29 MED ORDER — DIPHENHYDRAMINE HCL 25 MG PO CAPS
25.0000 mg | ORAL_CAPSULE | Freq: Once | ORAL | Status: AC
  Administered 2019-06-29: 25 mg via ORAL

## 2019-06-29 MED ORDER — TRASTUZUMAB-ANNS CHEMO 150 MG IV SOLR
450.0000 mg | Freq: Once | INTRAVENOUS | Status: AC
  Administered 2019-06-29: 450 mg via INTRAVENOUS
  Filled 2019-06-29: qty 21.43

## 2019-06-29 MED ORDER — SODIUM CHLORIDE 0.9 % IV SOLN
Freq: Once | INTRAVENOUS | Status: AC
  Filled 2019-06-29: qty 250

## 2019-06-29 MED ORDER — HEPARIN SOD (PORK) LOCK FLUSH 100 UNIT/ML IV SOLN
500.0000 [IU] | Freq: Once | INTRAVENOUS | Status: AC | PRN
  Administered 2019-06-29: 500 [IU]
  Filled 2019-06-29: qty 5

## 2019-06-29 MED ORDER — SODIUM CHLORIDE 0.9% FLUSH
10.0000 mL | INTRAVENOUS | Status: DC | PRN
  Administered 2019-06-29: 10 mL
  Filled 2019-06-29: qty 10

## 2019-06-29 NOTE — Patient Instructions (Signed)
[facility] Discharge Instructions for Patients Receiving Chemotherapy  Today you received the following chemotherapy agents: Kanjinti  To help prevent nausea and vomiting after your treatment, we encourage you to take your nausea medication as directed.    If you develop nausea and vomiting that is not controlled by your nausea medication, call the clinic.   BELOW ARE SYMPTOMS THAT SHOULD BE REPORTED IMMEDIATELY:  *FEVER GREATER THAN 100.5 F  *CHILLS WITH OR WITHOUT FEVER  NAUSEA AND VOMITING THAT IS NOT CONTROLLED WITH YOUR NAUSEA MEDICATION  *UNUSUAL SHORTNESS OF BREATH  *UNUSUAL BRUISING OR BLEEDING  TENDERNESS IN MOUTH AND THROAT WITH OR WITHOUT PRESENCE OF ULCERS  *URINARY PROBLEMS  *BOWEL PROBLEMS  UNUSUAL RASH Items with * indicate a potential emergency and should be followed up as soon as possible.  Feel free to call the clinic should you have any questions or concerns. The clinic phone number is [phone].  Please show the CHEMO ALERT CARD at check-in to the Emergency Department and triage nurse.   

## 2019-06-30 ENCOUNTER — Other Ambulatory Visit: Payer: Self-pay

## 2019-06-30 ENCOUNTER — Ambulatory Visit
Admission: RE | Admit: 2019-06-30 | Discharge: 2019-06-30 | Disposition: A | Discharge status: Home / Self Care | Payer: 59 | Source: Ambulatory Visit | Attending: Radiation Oncology | Admitting: Radiation Oncology

## 2019-06-30 DIAGNOSIS — Z510 Encounter for antineoplastic radiation therapy: Secondary | ICD-10-CM | POA: Diagnosis not present

## 2019-07-01 ENCOUNTER — Ambulatory Visit
Admission: RE | Admit: 2019-07-01 | Discharge: 2019-07-01 | Disposition: A | Discharge status: Home / Self Care | Payer: 59 | Source: Ambulatory Visit | Attending: Radiation Oncology | Admitting: Radiation Oncology

## 2019-07-01 ENCOUNTER — Other Ambulatory Visit: Payer: Self-pay

## 2019-07-01 DIAGNOSIS — Z510 Encounter for antineoplastic radiation therapy: Secondary | ICD-10-CM | POA: Diagnosis not present

## 2019-07-04 ENCOUNTER — Ambulatory Visit
Admission: RE | Admit: 2019-07-04 | Discharge: 2019-07-04 | Disposition: A | Discharge status: Home / Self Care | Payer: 59 | Source: Ambulatory Visit | Attending: Radiation Oncology | Admitting: Radiation Oncology

## 2019-07-04 ENCOUNTER — Other Ambulatory Visit: Payer: Self-pay

## 2019-07-04 DIAGNOSIS — Z510 Encounter for antineoplastic radiation therapy: Secondary | ICD-10-CM | POA: Diagnosis not present

## 2019-07-05 ENCOUNTER — Ambulatory Visit
Admission: RE | Admit: 2019-07-05 | Discharge: 2019-07-05 | Disposition: A | Discharge status: Home / Self Care | Payer: 59 | Source: Ambulatory Visit | Attending: Radiation Oncology | Admitting: Radiation Oncology

## 2019-07-05 ENCOUNTER — Other Ambulatory Visit: Payer: Self-pay

## 2019-07-05 DIAGNOSIS — Z510 Encounter for antineoplastic radiation therapy: Secondary | ICD-10-CM | POA: Diagnosis not present

## 2019-07-06 ENCOUNTER — Ambulatory Visit
Admission: RE | Admit: 2019-07-06 | Discharge: 2019-07-06 | Disposition: A | Discharge status: Home / Self Care | Payer: 59 | Source: Ambulatory Visit | Attending: Radiation Oncology | Admitting: Radiation Oncology

## 2019-07-06 ENCOUNTER — Other Ambulatory Visit: Payer: Self-pay

## 2019-07-06 DIAGNOSIS — Z510 Encounter for antineoplastic radiation therapy: Secondary | ICD-10-CM | POA: Diagnosis not present

## 2019-07-07 ENCOUNTER — Ambulatory Visit
Admission: RE | Admit: 2019-07-07 | Discharge: 2019-07-07 | Disposition: A | Discharge status: Home / Self Care | Payer: 59 | Source: Ambulatory Visit | Attending: Radiation Oncology | Admitting: Radiation Oncology

## 2019-07-07 ENCOUNTER — Other Ambulatory Visit: Payer: Self-pay

## 2019-07-07 DIAGNOSIS — Z510 Encounter for antineoplastic radiation therapy: Secondary | ICD-10-CM | POA: Diagnosis not present

## 2019-07-08 ENCOUNTER — Ambulatory Visit
Admission: RE | Admit: 2019-07-08 | Discharge: 2019-07-08 | Disposition: A | Discharge status: Home / Self Care | Payer: 59 | Source: Ambulatory Visit | Attending: Radiation Oncology | Admitting: Radiation Oncology

## 2019-07-08 ENCOUNTER — Other Ambulatory Visit: Payer: Self-pay

## 2019-07-08 DIAGNOSIS — Z510 Encounter for antineoplastic radiation therapy: Secondary | ICD-10-CM | POA: Diagnosis not present

## 2019-07-11 ENCOUNTER — Encounter (HOSPITAL_COMMUNITY): Payer: 59 | Admitting: Internal Medicine

## 2019-07-11 ENCOUNTER — Other Ambulatory Visit (HOSPITAL_COMMUNITY): Payer: 59

## 2019-07-11 ENCOUNTER — Ambulatory Visit: Payer: 59

## 2019-07-12 ENCOUNTER — Other Ambulatory Visit: Payer: Self-pay

## 2019-07-12 ENCOUNTER — Ambulatory Visit
Admission: RE | Admit: 2019-07-12 | Discharge: 2019-07-12 | Disposition: A | Discharge status: Home / Self Care | Payer: 59 | Source: Ambulatory Visit | Attending: Radiation Oncology | Admitting: Radiation Oncology

## 2019-07-12 DIAGNOSIS — Z510 Encounter for antineoplastic radiation therapy: Secondary | ICD-10-CM | POA: Diagnosis not present

## 2019-07-13 ENCOUNTER — Ambulatory Visit
Admission: RE | Admit: 2019-07-13 | Discharge: 2019-07-13 | Disposition: A | Discharge status: Home / Self Care | Payer: 59 | Source: Ambulatory Visit | Attending: Radiation Oncology | Admitting: Radiation Oncology

## 2019-07-13 ENCOUNTER — Other Ambulatory Visit: Payer: Self-pay

## 2019-07-13 DIAGNOSIS — Z510 Encounter for antineoplastic radiation therapy: Secondary | ICD-10-CM | POA: Diagnosis not present

## 2019-07-14 ENCOUNTER — Ambulatory Visit
Admission: RE | Admit: 2019-07-14 | Discharge: 2019-07-14 | Disposition: A | Discharge status: Home / Self Care | Payer: 59 | Source: Ambulatory Visit | Attending: Radiation Oncology | Admitting: Radiation Oncology

## 2019-07-14 DIAGNOSIS — Z510 Encounter for antineoplastic radiation therapy: Secondary | ICD-10-CM | POA: Diagnosis not present

## 2019-07-15 ENCOUNTER — Ambulatory Visit (HOSPITAL_COMMUNITY)
Admission: RE | Admit: 2019-07-15 | Discharge: 2019-07-15 | Disposition: A | Discharge status: Home / Self Care | Payer: 59 | Source: Ambulatory Visit | Attending: Internal Medicine | Admitting: Internal Medicine

## 2019-07-15 ENCOUNTER — Ambulatory Visit
Admission: RE | Admit: 2019-07-15 | Discharge: 2019-07-15 | Disposition: A | Discharge status: Home / Self Care | Payer: 59 | Source: Ambulatory Visit | Attending: Radiation Oncology | Admitting: Radiation Oncology

## 2019-07-15 ENCOUNTER — Other Ambulatory Visit: Payer: Self-pay

## 2019-07-15 DIAGNOSIS — C50411 Malignant neoplasm of upper-outer quadrant of right female breast: Secondary | ICD-10-CM | POA: Diagnosis not present

## 2019-07-15 DIAGNOSIS — Z01818 Encounter for other preprocedural examination: Secondary | ICD-10-CM | POA: Diagnosis present

## 2019-07-15 DIAGNOSIS — E785 Hyperlipidemia, unspecified: Secondary | ICD-10-CM | POA: Diagnosis not present

## 2019-07-15 DIAGNOSIS — Z170 Estrogen receptor positive status [ER+]: Secondary | ICD-10-CM | POA: Insufficient documentation

## 2019-07-15 DIAGNOSIS — Z510 Encounter for antineoplastic radiation therapy: Secondary | ICD-10-CM | POA: Diagnosis not present

## 2019-07-15 NOTE — Progress Notes (Signed)
  Echocardiogram 2D Echocardiogram has been performed.  Jennette Dubin 07/15/2019, 11:40 AM

## 2019-07-18 ENCOUNTER — Ambulatory Visit
Admission: RE | Admit: 2019-07-18 | Discharge: 2019-07-18 | Disposition: A | Discharge status: Home / Self Care | Payer: 59 | Source: Ambulatory Visit | Attending: Radiation Oncology | Admitting: Radiation Oncology

## 2019-07-18 DIAGNOSIS — Z510 Encounter for antineoplastic radiation therapy: Secondary | ICD-10-CM | POA: Diagnosis not present

## 2019-07-19 ENCOUNTER — Ambulatory Visit
Admission: RE | Admit: 2019-07-19 | Discharge: 2019-07-19 | Disposition: A | Discharge status: Home / Self Care | Payer: 59 | Source: Ambulatory Visit | Attending: Radiation Oncology | Admitting: Radiation Oncology

## 2019-07-19 ENCOUNTER — Other Ambulatory Visit: Payer: Self-pay

## 2019-07-19 DIAGNOSIS — Z510 Encounter for antineoplastic radiation therapy: Secondary | ICD-10-CM | POA: Diagnosis not present

## 2019-07-20 ENCOUNTER — Ambulatory Visit
Admission: RE | Admit: 2019-07-20 | Discharge: 2019-07-20 | Disposition: A | Discharge status: Home / Self Care | Payer: 59 | Source: Ambulatory Visit | Attending: Radiation Oncology | Admitting: Radiation Oncology

## 2019-07-20 ENCOUNTER — Inpatient Hospital Stay: Payer: 59

## 2019-07-20 ENCOUNTER — Other Ambulatory Visit: Payer: Self-pay

## 2019-07-20 VITALS — BP 123/78 | HR 78 | Temp 98.1°F | Resp 18 | Wt 172.2 lb

## 2019-07-20 DIAGNOSIS — Z5112 Encounter for antineoplastic immunotherapy: Secondary | ICD-10-CM | POA: Diagnosis not present

## 2019-07-20 DIAGNOSIS — Z510 Encounter for antineoplastic radiation therapy: Secondary | ICD-10-CM | POA: Diagnosis not present

## 2019-07-20 DIAGNOSIS — Z170 Estrogen receptor positive status [ER+]: Secondary | ICD-10-CM

## 2019-07-20 DIAGNOSIS — C50411 Malignant neoplasm of upper-outer quadrant of right female breast: Secondary | ICD-10-CM

## 2019-07-20 MED ORDER — SODIUM CHLORIDE 0.9% FLUSH
10.0000 mL | INTRAVENOUS | Status: DC | PRN
  Administered 2019-07-20: 10 mL
  Filled 2019-07-20: qty 10

## 2019-07-20 MED ORDER — SODIUM CHLORIDE 0.9 % IV SOLN
Freq: Once | INTRAVENOUS | Status: AC
  Filled 2019-07-20: qty 250

## 2019-07-20 MED ORDER — TRASTUZUMAB-ANNS CHEMO 150 MG IV SOLR
450.0000 mg | Freq: Once | INTRAVENOUS | Status: AC
  Administered 2019-07-20: 450 mg via INTRAVENOUS
  Filled 2019-07-20: qty 21.43

## 2019-07-20 MED ORDER — HEPARIN SOD (PORK) LOCK FLUSH 100 UNIT/ML IV SOLN
500.0000 [IU] | Freq: Once | INTRAVENOUS | Status: AC | PRN
  Administered 2019-07-20: 500 [IU]
  Filled 2019-07-20: qty 5

## 2019-07-20 MED ORDER — DIPHENHYDRAMINE HCL 25 MG PO CAPS
25.0000 mg | ORAL_CAPSULE | Freq: Once | ORAL | Status: AC
  Administered 2019-07-20: 25 mg via ORAL

## 2019-07-20 MED ORDER — ACETAMINOPHEN 325 MG PO TABS
ORAL_TABLET | ORAL | Status: AC
  Filled 2019-07-20: qty 2

## 2019-07-20 MED ORDER — DIPHENHYDRAMINE HCL 25 MG PO CAPS
ORAL_CAPSULE | ORAL | Status: AC
  Filled 2019-07-20: qty 1

## 2019-07-20 MED ORDER — ACETAMINOPHEN 325 MG PO TABS
650.0000 mg | ORAL_TABLET | Freq: Once | ORAL | Status: AC
  Administered 2019-07-20: 650 mg via ORAL

## 2019-07-20 NOTE — Patient Instructions (Signed)
[facility] Discharge Instructions for Patients Receiving Chemotherapy  Today you received the following chemotherapy agents: trastuzumab-anns  To help prevent nausea and vomiting after your treatment, we encourage you to take your nausea medication as directed.   If you develop nausea and vomiting that is not controlled by your nausea medication, call the clinic.   BELOW ARE SYMPTOMS THAT SHOULD BE REPORTED IMMEDIATELY:  *FEVER GREATER THAN 100.5 F  *CHILLS WITH OR WITHOUT FEVER  NAUSEA AND VOMITING THAT IS NOT CONTROLLED WITH YOUR NAUSEA MEDICATION  *UNUSUAL SHORTNESS OF BREATH  *UNUSUAL BRUISING OR BLEEDING  TENDERNESS IN MOUTH AND THROAT WITH OR WITHOUT PRESENCE OF ULCERS  *URINARY PROBLEMS  *BOWEL PROBLEMS  UNUSUAL RASH Items with * indicate a potential emergency and should be followed up as soon as possible.  Feel free to call the clinic should you have any questions or concerns. The clinic phone number is [phone].  Please show the CHEMO ALERT CARD at check-in to the Emergency Department and triage nurse.   

## 2019-07-21 ENCOUNTER — Ambulatory Visit
Admission: RE | Admit: 2019-07-21 | Discharge: 2019-07-21 | Disposition: A | Discharge status: Home / Self Care | Payer: 59 | Source: Ambulatory Visit | Attending: Radiation Oncology | Admitting: Radiation Oncology

## 2019-07-21 ENCOUNTER — Other Ambulatory Visit: Payer: Self-pay

## 2019-07-21 DIAGNOSIS — C50411 Malignant neoplasm of upper-outer quadrant of right female breast: Secondary | ICD-10-CM | POA: Insufficient documentation

## 2019-07-21 DIAGNOSIS — Z510 Encounter for antineoplastic radiation therapy: Secondary | ICD-10-CM | POA: Diagnosis not present

## 2019-07-22 ENCOUNTER — Ambulatory Visit: Payer: 59

## 2019-07-22 ENCOUNTER — Ambulatory Visit
Admission: RE | Admit: 2019-07-22 | Discharge: 2019-07-22 | Disposition: A | Discharge status: Home / Self Care | Payer: 59 | Source: Ambulatory Visit | Attending: Radiation Oncology | Admitting: Radiation Oncology

## 2019-07-22 ENCOUNTER — Other Ambulatory Visit: Payer: Self-pay

## 2019-07-22 ENCOUNTER — Ambulatory Visit: Payer: 59 | Admitting: Radiation Oncology

## 2019-07-22 DIAGNOSIS — Z510 Encounter for antineoplastic radiation therapy: Secondary | ICD-10-CM | POA: Diagnosis not present

## 2019-07-26 ENCOUNTER — Ambulatory Visit: Payer: 59

## 2019-07-26 ENCOUNTER — Ambulatory Visit
Admission: RE | Admit: 2019-07-26 | Discharge: 2019-07-26 | Disposition: A | Discharge status: Home / Self Care | Payer: 59 | Source: Ambulatory Visit | Attending: Radiation Oncology | Admitting: Radiation Oncology

## 2019-07-26 DIAGNOSIS — Z510 Encounter for antineoplastic radiation therapy: Secondary | ICD-10-CM | POA: Diagnosis not present

## 2019-07-27 ENCOUNTER — Ambulatory Visit
Admission: RE | Admit: 2019-07-27 | Discharge: 2019-07-27 | Disposition: A | Discharge status: Home / Self Care | Payer: 59 | Source: Ambulatory Visit | Attending: Radiation Oncology | Admitting: Radiation Oncology

## 2019-07-27 DIAGNOSIS — Z510 Encounter for antineoplastic radiation therapy: Secondary | ICD-10-CM | POA: Diagnosis not present

## 2019-07-28 ENCOUNTER — Ambulatory Visit
Admission: RE | Admit: 2019-07-28 | Discharge: 2019-07-28 | Disposition: A | Discharge status: Home / Self Care | Payer: 59 | Source: Ambulatory Visit | Attending: Radiation Oncology | Admitting: Radiation Oncology

## 2019-07-28 ENCOUNTER — Encounter: Payer: Self-pay | Admitting: *Deleted

## 2019-07-28 DIAGNOSIS — Z510 Encounter for antineoplastic radiation therapy: Secondary | ICD-10-CM | POA: Diagnosis not present

## 2019-07-29 ENCOUNTER — Ambulatory Visit: Payer: 59

## 2019-07-29 ENCOUNTER — Ambulatory Visit
Admission: RE | Admit: 2019-07-29 | Discharge: 2019-07-29 | Disposition: A | Discharge status: Home / Self Care | Payer: 59 | Source: Ambulatory Visit | Attending: Radiation Oncology | Admitting: Radiation Oncology

## 2019-07-29 DIAGNOSIS — Z510 Encounter for antineoplastic radiation therapy: Secondary | ICD-10-CM | POA: Diagnosis not present

## 2019-08-01 ENCOUNTER — Encounter: Payer: Self-pay | Admitting: Radiation Oncology

## 2019-08-01 ENCOUNTER — Ambulatory Visit
Admission: RE | Admit: 2019-08-01 | Discharge: 2019-08-01 | Disposition: A | Discharge status: Home / Self Care | Payer: 59 | Source: Ambulatory Visit | Attending: Radiation Oncology | Admitting: Radiation Oncology

## 2019-08-01 ENCOUNTER — Encounter: Payer: Self-pay | Admitting: *Deleted

## 2019-08-01 DIAGNOSIS — Z510 Encounter for antineoplastic radiation therapy: Secondary | ICD-10-CM | POA: Diagnosis not present

## 2019-08-09 ENCOUNTER — Encounter: Payer: Self-pay | Admitting: *Deleted

## 2019-08-10 ENCOUNTER — Encounter: Payer: Self-pay | Admitting: Nurse Practitioner

## 2019-08-10 ENCOUNTER — Other Ambulatory Visit: Payer: Self-pay

## 2019-08-10 ENCOUNTER — Inpatient Hospital Stay: Payer: 59 | Attending: Hematology

## 2019-08-10 ENCOUNTER — Inpatient Hospital Stay: Payer: 59

## 2019-08-10 ENCOUNTER — Inpatient Hospital Stay (HOSPITAL_BASED_OUTPATIENT_CLINIC_OR_DEPARTMENT_OTHER): Payer: 59 | Admitting: Nurse Practitioner

## 2019-08-10 VITALS — BP 140/79 | HR 67 | Temp 98.1°F | Resp 18 | Wt 175.0 lb

## 2019-08-10 DIAGNOSIS — C50411 Malignant neoplasm of upper-outer quadrant of right female breast: Secondary | ICD-10-CM

## 2019-08-10 DIAGNOSIS — Z170 Estrogen receptor positive status [ER+]: Secondary | ICD-10-CM | POA: Diagnosis not present

## 2019-08-10 DIAGNOSIS — Z5112 Encounter for antineoplastic immunotherapy: Secondary | ICD-10-CM | POA: Insufficient documentation

## 2019-08-10 LAB — CMP (CANCER CENTER ONLY)
ALT: 52 U/L — ABNORMAL HIGH (ref 0–44)
AST: 32 U/L (ref 15–41)
Albumin: 3.6 g/dL (ref 3.5–5.0)
Alkaline Phosphatase: 74 U/L (ref 38–126)
Anion gap: 7 (ref 5–15)
BUN: 14 mg/dL (ref 8–23)
CO2: 25 mmol/L (ref 22–32)
Calcium: 9.7 mg/dL (ref 8.9–10.3)
Chloride: 107 mmol/L (ref 98–111)
Creatinine: 0.97 mg/dL (ref 0.44–1.00)
GFR, Est AFR Am: >60 mL/min (ref >60)
GFR, Estimated: >60 mL/min (ref >60)
Glucose, Bld: 109 mg/dL — ABNORMAL HIGH (ref 70–99)
Potassium: 3.9 mmol/L (ref 3.5–5.1)
Sodium: 139 mmol/L (ref 135–145)
Total Bilirubin: 0.3 mg/dL (ref 0.3–1.2)
Total Protein: 6.5 g/dL (ref 6.5–8.1)

## 2019-08-10 LAB — CBC WITH DIFFERENTIAL (CANCER CENTER ONLY)
Abs Immature Granulocytes: 0.01 10*3/uL (ref 0.00–0.07)
Basophils Absolute: 0 10*3/uL (ref 0.0–0.1)
Basophils Relative: 1 %
Eosinophils Absolute: 0.1 10*3/uL (ref 0.0–0.5)
Eosinophils Relative: 3 %
HCT: 40.8 % (ref 36.0–46.0)
Hemoglobin: 13.9 g/dL (ref 12.0–15.0)
Immature Granulocytes: 0 %
Lymphocytes Relative: 25 %
Lymphs Abs: 1 10*3/uL (ref 0.7–4.0)
MCH: 30.5 pg (ref 26.0–34.0)
MCHC: 34.1 g/dL (ref 30.0–36.0)
MCV: 89.5 fL (ref 80.0–100.0)
Monocytes Absolute: 0.5 10*3/uL (ref 0.1–1.0)
Monocytes Relative: 12 %
Neutro Abs: 2.3 10*3/uL (ref 1.7–7.7)
Neutrophils Relative %: 59 %
Platelet Count: 167 10*3/uL (ref 150–400)
RBC: 4.56 MIL/uL (ref 3.87–5.11)
RDW: 12 % (ref 11.5–15.5)
WBC Count: 3.8 10*3/uL — ABNORMAL LOW (ref 4.0–10.5)
nRBC: 0 % (ref 0.0–0.2)

## 2019-08-10 MED ORDER — TRASTUZUMAB-ANNS CHEMO 150 MG IV SOLR
450.0000 mg | Freq: Once | INTRAVENOUS | Status: AC
  Administered 2019-08-10: 450 mg via INTRAVENOUS
  Filled 2019-08-10: qty 21.43

## 2019-08-10 MED ORDER — SODIUM CHLORIDE 0.9 % IV SOLN
Freq: Once | INTRAVENOUS | Status: AC
  Filled 2019-08-10: qty 250

## 2019-08-10 MED ORDER — HEPARIN SOD (PORK) LOCK FLUSH 100 UNIT/ML IV SOLN
500.0000 [IU] | Freq: Once | INTRAVENOUS | Status: AC | PRN
  Administered 2019-08-10: 500 [IU]
  Filled 2019-08-10: qty 5

## 2019-08-10 MED ORDER — ACETAMINOPHEN 325 MG PO TABS
ORAL_TABLET | ORAL | Status: AC
  Filled 2019-08-10: qty 2

## 2019-08-10 MED ORDER — DIPHENHYDRAMINE HCL 25 MG PO CAPS
25.0000 mg | ORAL_CAPSULE | Freq: Once | ORAL | Status: AC
  Administered 2019-08-10: 25 mg via ORAL

## 2019-08-10 MED ORDER — SODIUM CHLORIDE 0.9% FLUSH
10.0000 mL | INTRAVENOUS | Status: DC | PRN
  Administered 2019-08-10: 10 mL
  Filled 2019-08-10: qty 10

## 2019-08-10 MED ORDER — DIPHENHYDRAMINE HCL 25 MG PO CAPS
ORAL_CAPSULE | ORAL | Status: AC
  Filled 2019-08-10: qty 1

## 2019-08-10 MED ORDER — ACETAMINOPHEN 325 MG PO TABS
650.0000 mg | ORAL_TABLET | Freq: Once | ORAL | Status: AC
  Administered 2019-08-10: 650 mg via ORAL

## 2019-08-10 NOTE — Progress Notes (Signed)
[facility]   [phone] [phone]   Clinic Follow up Note   Patient Care Team: Lorrene Reid, PA-C as PCP - General Bensimhon, Shaune Pascal, MD as PCP - Advanced Heart Failure (Cardiology) Mcarthur Rossetti, MD as Consulting Physician (Orthopedic Surgery) Molli Posey, MD as Consulting Physician (Obstetrics and Gynecology) Erroll Luna, MD as Consulting Physician (General Surgery) Truitt Merle, MD as Consulting Physician (Hematology) Kyung Rudd, MD as Consulting Physician (Radiation Oncology) Mauro Kaufmann, RN as Oncology Nurse Navigator Rockwell Germany, RN as Oncology Nurse Navigator 08/10/2019  CHIEF COMPLAINT: Follow-up right breast cancer  SUMMARY OF ONCOLOGIC HISTORY: Oncology History Overview Note  Cancer Staging Malignant neoplasm of upper-outer quadrant of right breast in female, estrogen receptor positive ([place]) Staging form: Breast, AJCC 8th Edition - Clinical stage from 12/29/2018: Stage IA (cT1b, cN0, cM0, G2, ER+, PR+, HER2+) - Unsigned - Pathologic stage from 01/27/2019: Stage IA (pT1c, pN0, cM0, G3, ER+, PR+, HER2+) - Signed by Truitt Merle, MD on 02/06/2019 Cancer Staging No matching staging information was found for the patient.    Malignant neoplasm of upper-outer quadrant of right breast in female, estrogen receptor positive ([place])  12/22/2018 Mammogram    Diagnostic Mammogram 12/22/18  IMPRESSION: 1. 9 mm mass, with associated calcifications and architectural distortion, in the 11 o'clock position of the right breast, highly suspicious for breast carcinoma. 2. Questionable mass noted in the inferior aspect of the right breast remains equivocal on diagnostic imaging may reflect focal fibroglandular tissue. There is no sonographic correlate.   12/23/2018 Initial Biopsy   Diagnosis 12/23/18 Breast, right, needle core biopsy, 11 o'clock position - INVASIVE DUCTAL CARCINOMA, SEE COMMENT. - DUCTAL CARCINOMA IN SITU WITH  NECROSIS.   12/28/2018 Initial Diagnosis   Malignant neoplasm of upper-outer quadrant of right breast in female, estrogen receptor positive ([place])   01/13/2019 Imaging   MRI Breast 01/13/19  IMPRESSION: 1. Biopsy proven malignancy in the upper-outer right breast. No additional suspicious findings on the right. 2. No MRI evidence of malignancy on the left. 3. No suspicious lymphadenopathy.   01/27/2019 Surgery   RIGHT BREAST LUMPECTOMY WITH RADIOACTIVE SEED AND SENTINEL LYMPH NODE MAPPING and PAC placement by Dr Brantley Stage  01/27/19   01/27/2019 Pathology Results   FINAL MICROSCOPIC DIAGNOSIS: 01/27/19  A. BREAST, RIGHT, LUMPECTOMY:  - Invasive ductal carcinoma, grade 3, spanning 1.3 cm.  - High grade ductal carcinoma in situ with necrosis.  - Biopsy site.  - Final resection margins are negative for carcinoma.  - See oncology table.   B. BREAST, RIGHT ADDITIONAL SUPERIOR MARGIN, EXCISION:  - Fibrocystic change and adenosis.  - No malignancy identified.   C. LYMPH NODE, RIGHT #1, SENTINEL, BIOPSY:  - One of one lymph nodes negative for carcinoma (0/1).   D. LYMPH NODE, RIGHT, SENTINEL, BIOPSY:  - One of one lymph nodes negative for carcinoma (0/1).   E. LYMPH NODE, RIGHT, SENTINEL, BIOPSY:  - One of one lymph nodes negative for carcinoma (0/1).   F. LYMPH NODE, RIGHT, SENTINEL, BIOPSY:  - One of one lymph nodes negative for carcinoma (0/1).       01/27/2019 Cancer Staging   Staging form: Breast, AJCC 8th Edition - Pathologic stage from 01/27/2019: Stage IA (pT1c, pN0, cM0, G3, ER+, PR+, HER2+) - Signed by Truitt Merle, MD on 02/06/2019   02/08/2019 Imaging   CT AP W Contrast 02/08/19  IMPRESSION: 1. Wall thickening of the gastric antrum may represent gastritis.   Aortic Atherosclerosis (ICD10-I70.0).  02/23/2019 -  Chemotherapy   Adjuvant weekly Abraxane with Herceptin q3weeks for 12 cycles 02/23/19-05/11/19. Maintenance Herceptin q3weeks start 05/18/19 to complete 1 year of treatment  from 02/2019. Her insurance denied the injections.    06/13/2019 - 08/01/2019 Radiation Therapy   Radiation with Dr Lisbeth Renshaw starting 06/13/19-08/01/19     CURRENT THERAPY: Completed adjuvant radiation, continues maintenance Herceptin every 3 weeks.  Adjuvant endocrine therapy is pending  INTERVAL HISTORY: Ms. Karan returns for follow-up and treatment as scheduled.  She continues Herceptin, last treatment on 6/30.  She completed radiation on 08/01/2019.  She tolerated well with mild "sunburn" and a little peeling under the right breast.  She believes she is postmenopausal.  Mirena was removed in December, no further vaginal bleeding.  She is still having "bothersome" hot flashes, low libido, and "vaginal atrophy."  She wants to start Wellbutrin for this.  [facility]  She does not recall the result.  Might be interested in plastic surgery for a breast reduction and lift in the future.  She is otherwise doing well, normal appetite and energy level.  Neuropathy resolved.  No fever, chills, cough, chest pain, dyspnea, edema, N/V/C/D.   MEDICAL HISTORY:  Past Medical History:  Diagnosis Date   Cancer ([place])    breast cancer   Gastric ulcer    Hyperlipidemia     SURGICAL HISTORY: Past Surgical History:  Procedure Laterality Date   BREAST LUMPECTOMY WITH RADIOACTIVE SEED AND SENTINEL LYMPH NODE BIOPSY Right 01/27/2019   Procedure: RIGHT BREAST LUMPECTOMY WITH RADIOACTIVE SEED AND SENTINEL LYMPH NODE MAPPING;  Surgeon: Erroll Luna, MD;  Location: [facility];  Service: General;  Laterality: Right;   COSMETIC SURGERY Bilateral 2000   breast aug/ lipo   PORTACATH PLACEMENT Right 01/27/2019   Procedure: INSERTION PORT-A-CATH WITH ULTRASOUND;  Surgeon: Erroll Luna, MD;  Location: [facility];  Service: General;  Laterality: Right;    I have reviewed the social history and family history with the patient and  they are unchanged from previous note.  ALLERGIES:  has No Known Allergies.  MEDICATIONS:  Current Outpatient Medications  Medication Sig Dispense Refill   CALCIUM PO Take 1,000 mg by mouth daily.     Magnesium 125 MG CAPS Take 1 capsule by mouth daily.      Melatonin 3 MG CAPS Take 3 mg by mouth at bedtime.      Multiple Vitamins-Minerals (MULTIVITAMIN WITH MINERALS) tablet Take 1 tablet by mouth daily.     traZODone (DESYREL) 50 MG tablet Take 1 tablet (50 mg total) by mouth at bedtime as needed for sleep. 90 tablet 1   VITAMIN D PO Take 12,000 Units by mouth daily.     No current facility-administered medications for this visit.   Facility-Administered Medications Ordered in Other Visits  Medication Dose Route Frequency Provider Last Rate Last Admin   heparin lock flush 100 unit/mL  500 Units Intracatheter Once PRN Truitt Merle, MD       sodium chloride flush (NS) 0.9 % injection 10 mL  10 mL Intracatheter PRN Truitt Merle, MD       trastuzumab-anns [facility]) 450 mg in sodium chloride 0.9 % 250 mL chemo infusion  450 mg Intravenous Once Truitt Merle, MD        PHYSICAL EXAMINATION: ECOG PERFORMANCE STATUS: 0 - Asymptomatic  Vitals:   08/10/19 1120  BP: 140/79  Pulse: 67  Resp: 18  Temp:  98.1 F (36.7 C)  SpO2: 99%   Filed Weights   08/10/19 1120  Weight: 175 lb (79.4 kg)    GENERAL:alert, no distress and comfortable SKIN: No rash to exposed skin EYES:  sclera clear LUNGS:  normal breathing effort HEART: no lower extremity edema NEURO: alert & oriented x 3 with fluent speech, normal gait PAC without erythema Breast: S/p right lumpectomy and radiation, whole breast with mild erythema with some hyperpigmentation and peeling to the inframammary fold.  Skin is intact   LABORATORY DATA:  I have reviewed the data as listed CBC Latest Ref Rng & Units 08/10/2019 06/08/2019 05/11/2019  WBC 4.0 - 10.5 K/uL 3.8(L) 5.8 4.5  Hemoglobin 12.0 - 15.0 g/dL 13.9 14.0 11.1(L)    Hematocrit 36 - 46 % 40.8 42.8 32.7(L)  Platelets 150 - 400 K/uL 167 203 204     CMP Latest Ref Rng & Units 08/10/2019 06/08/2019 05/11/2019  Glucose 70 - 99 mg/dL 109(H) 95 130(H)  BUN 8 - 23 mg/dL '14 16 16  '$ Creatinine 0.44 - 1.00 mg/dL 0.97 0.90 0.84  Sodium 135 - 145 mmol/L 139 142 140  Potassium 3.5 - 5.1 mmol/L 3.9 4.4 3.8  Chloride 98 - 111 mmol/L 107 107 110  CO2 22 - 32 mmol/L '25 25 23  '$ Calcium 8.9 - 10.3 mg/dL 9.7 9.6 8.4(L)  Total Protein 6.5 - 8.1 g/dL 6.5 6.9 5.7(L)  Total Bilirubin 0.3 - 1.2 mg/dL 0.3 0.3 0.4  Alkaline Phos 38 - 126 U/L 74 82 78  AST 15 - 41 U/L 32 64(H) 51(H)  ALT 0 - 44 U/L 52(H) 134(H) 126(H)      RADIOGRAPHIC STUDIES: I have personally reviewed the radiological images as listed and agreed with the findings in the report. No results found.   ASSESSMENT & PLAN: Alicia Hahn is a 59 y.o. female with    1.Malignant neoplasm of upper-outer quadrant of right breast,invasive ductal carcinoma,pT1cN0M0,stageIA,Triple Positive,Grade3 -She was recently diagnosed in 12/2018.She has 28m mass of right breast with invasive ductal carcinoma and components of DCIS.She underwent right lumpectomy and SLNB on 01/27/19 by Dr CBrantley Stage  -Given the size of her tumor and HER2 positive disease she has higher risk of recurrence.  She was started on adjuvant chemotherapy with Weekly Abraxane and Herceptin q3weeks for 12 weeks2/3/21-4/21/21.   She has no residual side effects from chemotherapy.   -She started maintenance Herceptin to complete 1 year of treatment on 05/18/19. Her insurance denied the injections.   -To reduce her risk of local recurrence she proceeded with adjuvant RT with Dr MLisbeth Renshawstarting on 06/13/19 -08/01/2019.  He tolerated well with expected skin erythema and mild peeling, she is already recovering well.   -Plan to start antiestrogen therapy  pending further work-up including FSH and estradiol to determine menopausal status and DEXA  2.  Gastric ulcers  -After surgery she took Motrin and started having severe epigastric pain and required ED visit.  -Her 02/25/19 Endoscopy with Dr. BTarri Glennshowed 5 small gastric ulcers, benign, treated with Protonix -Repeat endoscopy on 06/03/2019 showed resolution of prior gastric ulcers.  Colonoscopy same day showed a 3 mm polyp in the sigmoid colon, path showed tubular adenoma  3. Estrogen replacement andpostmenopausal vaginalbleeding  -Started after 2nd Mirena was taken out. Her Gyn put her on a third Mirena and bleeding stopped.  Last Mirena removed in 12/2018 prior to starting chemo.  Denies further vaginal bleeding -Shewas on estrogen replacement patch for several years for hot flashes by  her GYN. -She has stopped estrogen replacement since her breast cancer diagnosis, continues to have symptoms of menopause such as hot flash, low libido, vaginal dryness  4. Genetics  -Based on family history her father has prostate cancer and she had significant family history of breast cancer. She is eligible for genetic testing.  -Pt notes she had genetic testing before at [facility]. [facility] on 06/03/2017: negative for pathogenic mutation, VUS in ATM [id number]. report also showed remaining lifetime breast cancer risk to be 30.5 % which is high.   5. Transaminitis -she was noticed to have ALT 65 before she started chemo, which was normal one month before.Transaminitis got worse after she started chemo. Likely related to chemo, also possible related to Lipitor which started a few months ago -Her LFTs did improve after stopping lipitor and continues to improve  Disposition: Ms. Moskowitz appears stable.  She completed adjuvant radiation with expected side effects.  She is recovering well.  She continues to tolerate Herceptin well overall.  Echo on 07/15/2019 shows LVEF 55-60% with left ventricular grade 1 diastolic dysfunction, similar to baseline echo on 01/07/2019.  She will see Dr.  Haroldine Laws on 7/22.  She will proceed with another cycle of Herceptin today as planned.  We discussed the next phase in her treatment plan includes beginning antiestrogen therapy.  She is still exhibiting symptoms of menopause since her estrogen replacement was stopped upon her breast cancer diagnosis.  Will obtain [place] and estradiol today.  We will update DEXA scan in the next week or so.  Will determine tamoxifen versus AI pending further work-up.  She currently has hot flashes, low libido, and vaginal changes.  She is interested in starting Wellbutrin for the symptoms, a drug she read about but has not tried previously.  Denies depression.  I plan to discuss this further after her work-up to determine which antiestrogen agent she will be on and then make a decision.  She is comfortable with this plan.  Will return for lab, flush, Herceptin in 3, 6, and 9 weeks.  I will see her for survivorship care plan visit prior to Herceptin in 9 weeks.   Orders Placed This Encounter  Procedures   DG Bone Density    Standing Status:   Future    Standing Expiration Date:   08/09/2020    Order Specific Question:   Reason for Exam (SYMPTOM  OR DIAGNOSIS REQUIRED)    Answer:   new baseline before anti-estrogen therapy    Order Specific Question:   Preferred imaging location?    Answer:   [facility]   [facility]    Standing Status:   Future    Number of Occurrences:   1    Standing Expiration Date:   06/08/8020   FSH-Follicle stimulating hormone    Standing Status:   Future    Number of Occurrences:   1    Standing Expiration Date:   08/09/2020   All questions were answered. The patient knows to call the clinic with any problems, questions or concerns. No barriers to learning were detected. Total encounter time was 30 minutes.      Alla Feeling, NP 08/10/19

## 2019-08-10 NOTE — Patient Instructions (Signed)
[facility] Discharge Instructions for Patients Receiving Chemotherapy  Today you received the following chemotherapy agents trastuzumab.  To help prevent nausea and vomiting after your treatment, we encourage you to take your nausea medication as directed.    If you develop nausea and vomiting that is not controlled by your nausea medication, call the clinic.   BELOW ARE SYMPTOMS THAT SHOULD BE REPORTED IMMEDIATELY:  *FEVER GREATER THAN 100.5 F  *CHILLS WITH OR WITHOUT FEVER  NAUSEA AND VOMITING THAT IS NOT CONTROLLED WITH YOUR NAUSEA MEDICATION  *UNUSUAL SHORTNESS OF BREATH  *UNUSUAL BRUISING OR BLEEDING  TENDERNESS IN MOUTH AND THROAT WITH OR WITHOUT PRESENCE OF ULCERS  *URINARY PROBLEMS  *BOWEL PROBLEMS  UNUSUAL RASH Items with * indicate a potential emergency and should be followed up as soon as possible.  Feel free to call the clinic should you have any questions or concerns. The clinic phone number is [phone].  Please show the CHEMO ALERT CARD at check-in to the Emergency Department and triage nurse.   

## 2019-08-11 ENCOUNTER — Encounter (HOSPITAL_COMMUNITY): Payer: Self-pay | Admitting: Internal Medicine

## 2019-08-11 ENCOUNTER — Telehealth: Payer: Self-pay | Admitting: Hematology

## 2019-08-11 ENCOUNTER — Ambulatory Visit (HOSPITAL_COMMUNITY)
Admission: RE | Admit: 2019-08-11 | Discharge: 2019-08-11 | Disposition: A | Discharge status: Home / Self Care | Payer: 59 | Source: Ambulatory Visit | Attending: Internal Medicine | Admitting: Internal Medicine

## 2019-08-11 VITALS — BP 132/90 | HR 70 | Wt 175.0 lb

## 2019-08-11 DIAGNOSIS — Z79899 Other long term (current) drug therapy: Secondary | ICD-10-CM | POA: Diagnosis not present

## 2019-08-11 DIAGNOSIS — Z01818 Encounter for other preprocedural examination: Secondary | ICD-10-CM | POA: Insufficient documentation

## 2019-08-11 DIAGNOSIS — Z87891 Personal history of nicotine dependence: Secondary | ICD-10-CM | POA: Diagnosis not present

## 2019-08-11 DIAGNOSIS — Z8249 Family history of ischemic heart disease and other diseases of the circulatory system: Secondary | ICD-10-CM | POA: Diagnosis not present

## 2019-08-11 DIAGNOSIS — E785 Hyperlipidemia, unspecified: Secondary | ICD-10-CM | POA: Insufficient documentation

## 2019-08-11 DIAGNOSIS — Z170 Estrogen receptor positive status [ER+]: Secondary | ICD-10-CM | POA: Insufficient documentation

## 2019-08-11 DIAGNOSIS — Z803 Family history of malignant neoplasm of breast: Secondary | ICD-10-CM | POA: Diagnosis not present

## 2019-08-11 DIAGNOSIS — R600 Localized edema: Secondary | ICD-10-CM | POA: Insufficient documentation

## 2019-08-11 DIAGNOSIS — C50411 Malignant neoplasm of upper-outer quadrant of right female breast: Secondary | ICD-10-CM | POA: Diagnosis not present

## 2019-08-11 DIAGNOSIS — Z8349 Family history of other endocrine, nutritional and metabolic diseases: Secondary | ICD-10-CM | POA: Diagnosis not present

## 2019-08-11 LAB — FOLLICLE STIMULATING HORMONE: FSH: 110 m[IU]/mL

## 2019-08-11 LAB — ESTRADIOL: Estradiol: 5 pg/mL

## 2019-08-11 NOTE — Telephone Encounter (Signed)
Scheduled per 7/21 los. Pt is aware of appt times and dates.

## 2019-08-11 NOTE — Patient Instructions (Signed)
Your physician recommends that you schedule a follow-up appointment in: 3 months with echocardiogram  

## 2019-08-11 NOTE — Addendum Note (Signed)
Encounter addended by: Scarlette Calico, RN on: 08/11/2019 2:22 PM  Actions taken: Clinical Note Signed

## 2019-08-11 NOTE — Addendum Note (Signed)
Encounter addended by: Scarlette Calico, RN on: 08/11/2019 2:31 PM  Actions taken: Order list changed, Diagnosis association updated

## 2019-08-11 NOTE — Progress Notes (Signed)
CARDIO-ONCOLOGY CLINIC CONSULT NOTE  Referring Physician: Dr. 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 Burr Medico for enrollment into the Cardio-Oncology program.  SUMMARY OF ONCOLOGIC HISTORY:     Oncology History Overview Note   Cancer Staging Malignant neoplasm of upper-outer quadrant of right breast in female, estrogen receptor positive ([place]) Staging form: Breast, AJCC 8th Edition - Clinical stage from 12/29/2018: Stage IA (cT1b, cN0, cM0, G2, ER+, PR+, HER2+) - Unsigned - Pathologic stage from 01/27/2019: Stage IA (pT1c, pN0, cM0, G3, ER+, PR+, HER2+) - Signed by Truitt Merle, MD on 02/06/2019 Cancer Staging No matching staging information was found for the patient.    Malignant neoplasm of upper-outer quadrant of right breast in female, estrogen receptor positive ([place])   12/22/2018 Mammogram     Diagnostic Mammogram 12/22/18  IMPRESSION: 1. 9 mm mass, with associated calcifications and architectural distortion, in the 11 o'clock position of the right breast, highly suspicious for breast carcinoma. 2. Questionable mass noted in the inferior aspect of the right breast remains equivocal on diagnostic imaging may reflect focal fibroglandular tissue. There is no sonographic correlate.    12/23/2018 Initial Biopsy    Diagnosis 12/23/18 Breast, right, needle core biopsy, 11 o'clock position - INVASIVE DUCTAL CARCINOMA, SEE COMMENT. - DUCTAL CARCINOMA IN SITU WITH NECROSIS.    12/28/2018 Initial Diagnosis    Malignant neoplasm of upper-outer quadrant of right breast in female, estrogen receptor positive ([place])    01/13/2019 Imaging    MRI Breast 01/13/19  IMPRESSION: 1. Biopsy proven malignancy in the upper-outer right breast. No additional suspicious findings on the right. 2. No MRI evidence of malignancy on the left. 3. No suspicious lymphadenopathy.    01/27/2019 Surgery    RIGHT BREAST  LUMPECTOMY WITH RADIOACTIVE SEED AND SENTINEL LYMPH NODE MAPPING and PAC placement by Dr Brantley Stage  01/27/19    01/27/2019 Pathology Results    FINAL MICROSCOPIC DIAGNOSIS: 01/27/19  A. BREAST, RIGHT, LUMPECTOMY:  - Invasive ductal carcinoma, grade 3, spanning 1.3 cm.  - High grade ductal carcinoma in situ with necrosis.  - Biopsy site.  - Final resection margins are negative for carcinoma.  - See oncology table.   B. BREAST, RIGHT ADDITIONAL SUPERIOR MARGIN, EXCISION:  - Fibrocystic change and adenosis.  - No malignancy identified.   C. LYMPH NODE, RIGHT #1, SENTINEL, BIOPSY:  - One of one lymph nodes negative for carcinoma (0/1).   D. LYMPH NODE, RIGHT, SENTINEL, BIOPSY:  - One of one lymph nodes negative for carcinoma (0/1).   E. LYMPH NODE, RIGHT, SENTINEL, BIOPSY:  - One of one lymph nodes negative for carcinoma (0/1).   F. LYMPH NODE, RIGHT, SENTINEL, BIOPSY:  - One of one lymph nodes negative for carcinoma (0/1).        01/27/2019 Cancer Staging    Staging form: Breast, AJCC 8th Edition - Pathologic stage from 01/27/2019: Stage IA (pT1c, pN0, cM0, G3, ER+, PR+, HER2+) - Signed by Truitt Merle, MD on 02/06/2019    02/08/2019 Imaging    CT AP W Contrast 02/08/19  IMPRESSION: 1. Wall thickening of the gastric antrum may represent gastritis.  Aortic Atherosclerosis (ICD10-I70.0).    02/23/2019 -  Chemotherapy    Adjuvant weekly Abraxane with Herceptin q3weeks starting2/3/21      ECHO: 12/20: EF 55-60% grade I DD  Echo 04/13/19: EF 60-65%    Says she is feeling good. Tolerating  Herceptin well. No SOB or edema.   Echo 07/15/19 EF 60-65% Personally reviewed     Past Medical History:  Diagnosis Date   Cancer ([place])    breast cancer   Gastric ulcer    Hyperlipidemia     Current Outpatient Medications  Medication Sig Dispense Refill   CALCIUM PO Take 1,000 mg by mouth daily.     Magnesium 125 MG CAPS Take 1 capsule by mouth daily.      Melatonin  3 MG CAPS Take 3 mg by mouth at bedtime.      Multiple Vitamins-Minerals (MULTIVITAMIN WITH MINERALS) tablet Take 1 tablet by mouth daily.     traZODone (DESYREL) 50 MG tablet Take 1 tablet (50 mg total) by mouth at bedtime as needed for sleep. 90 tablet 1   VITAMIN D PO Take 12,000 Units by mouth daily.     No current facility-administered medications for this encounter.    No Known Allergies    Social History   Socioeconomic History   Marital status: Married    Spouse name: Not on file   Number of children: 2   Years of education: Not on file   Highest education level: Not on file  Occupational History   Not on file  Tobacco Use   Smoking status: Former Smoker    Packs/day: 0.25    Years: 5.00    Pack years: 1.25    Quit date: 01/20/1978    Years since quitting: 41.5   Smokeless tobacco: Never Used   Tobacco comment: 1982  Vaping Use   Vaping Use: Never used  Substance and Sexual Activity   Alcohol use: Yes    Alcohol/week: 6.0 standard drinks    Types: 6 Glasses of wine per week    Comment: occasional   Drug use: No   Sexual activity: Yes    Birth control/protection: None  Other Topics Concern   Not on file  Social History Narrative   Not on file   Social Determinants of Health   Financial Resource Strain:    Difficulty of Paying Living Expenses:   Food Insecurity:    Worried About Charity fundraiser in the Last Year:    Arboriculturist in the Last Year:   Transportation Needs:    Film/video editor (Medical):    Lack of Transportation (Non-Medical):   Physical Activity:    Days of Exercise per Week:    Minutes of Exercise per Session:   Stress:    Feeling of Stress :   Social Connections:    Frequency of Communication with Friends and Family:    Frequency of Social Gatherings with Friends and Family:    Attends Religious Services:    Active Member of Clubs or Organizations:    Attends Music therapist:     Marital Status:   Intimate Partner Violence:    Fear of Current or Ex-Partner:    Emotionally Abused:    Physically Abused:    Sexually Abused:       Family History  Problem Relation Age of Onset   Diabetes Mother    Heart disease Mother    Hyperlipidemia Mother    Breast cancer Mother 85   Cancer Mother        breast DCIS   Cancer Father 36       prostate cancer    Diabetes Father    Breast cancer Sister 85   Cancer Sister 70  breast cancer    Cancer Maternal Aunt 55       breast cancer    Cancer Maternal Grandfather        colon cancer   Colon cancer Maternal Grandfather    Esophageal cancer Neg Hx    Rectal cancer Neg Hx    Stomach cancer Neg Hx     Vitals:   08/11/19 1345  BP: (!) 132/90  Pulse: 70  SpO2: 98%  Weight: 79.4 kg (175 lb)    PHYSICAL EXAM: General:  Well appearing. No resp difficulty HEENT: normal Neck: supple. no JVD. Carotids 2+ bilat; no bruits. No lymphadenopathy or thryomegaly appreciated. Cor: PMI nondisplaced. Regular rate & rhythm. No rubs, gallops or murmurs. R port-a-cath Lungs: clear Abdomen: soft, nontender, nondistended. No hepatosplenomegaly. No bruits or masses. Good bowel sounds. Extremities: no cyanosis, clubbing, rash, edema Neuro: alert & orientedx3, cranial nerves grossly intact. moves all 4 extremities w/o difficulty. Affect pleasant   ASSESSMENT & PLAN:  1. Right Breast Cancer - diagnosed 12/20 - s/p lumpectomy - I reviewed echos personally. EF and Doppler parameters stable. No HF on exam. Continue Herceptin.   2. Mild LE edema - can add low-dose spironolactone as needed  Glori Bickers, MD  2:13 PM

## 2019-08-12 ENCOUNTER — Telehealth: Payer: Self-pay

## 2019-08-12 NOTE — Telephone Encounter (Signed)
Spoke with patient made aware of results possible medication recommendations and future Dexa scan

## 2019-08-12 NOTE — Telephone Encounter (Signed)
-----   Message from Alla Feeling, NP sent at 08/12/2019  7:24 AM EDT ----- Please let her know hormones show she is postmenopausal. I will likely recommend anastrozole or letrozole unless DEXA shows severe osteoporosis. Please call breast center to get that scheduled next week if possible. I will call her when results are back to finalize anti-estrogen plan.  Thanks, Regan Rakers

## 2019-08-15 ENCOUNTER — Encounter: Payer: Self-pay | Admitting: *Deleted

## 2019-08-25 ENCOUNTER — Other Ambulatory Visit: Payer: Self-pay | Admitting: Nurse Practitioner

## 2019-08-25 ENCOUNTER — Telehealth: Payer: Self-pay

## 2019-08-25 MED ORDER — LETROZOLE 2.5 MG PO TABS
2.5000 mg | ORAL_TABLET | Freq: Every day | ORAL | 3 refills | Status: DC
Start: 2019-08-25 — End: 2019-12-26

## 2019-08-25 NOTE — Telephone Encounter (Signed)
-----   Message from Alla Feeling, NP sent at 08/25/2019 11:08 AM EDT ----- OK. Please let her know I have called in letrozole for her to start now. Will see what DEXA shows in September.  Thanks, Regan Rakers  ----- Message ----- From: Raynelle Bring, LPN Sent: 05/01/8784   8:59 AM EDT To: Alla Feeling, NP  [facility] can not see her until sept 14th at [place] however she was encouraged to call frequently because if they have a cancellations they will get her in earlier  ----- Message ----- From: Alla Feeling, NP Sent: 08/12/2019   7:24 AM EDT To: Raynelle Bring, LPN, [facility]  Please let her know hormones show she is postmenopausal. I will likely recommend anastrozole or letrozole unless DEXA shows severe osteoporosis. Please call breast center to get that scheduled next week if possible. I will call her when results are back to finalize anti-estrogen plan.  Thanks, Regan Rakers

## 2019-08-25 NOTE — Telephone Encounter (Signed)
Made pt aware medication is at pharmacy

## 2019-08-26 ENCOUNTER — Other Ambulatory Visit: Payer: Self-pay | Admitting: Hematology

## 2019-08-29 ENCOUNTER — Telehealth: Payer: Self-pay | Admitting: Radiation Oncology

## 2019-08-29 NOTE — Telephone Encounter (Signed)
  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  Interval Since Last Radiation:  5 weeks   06/13/19-08/01/19: The right breast was treated to 50.4 Gy in 28 fractions, followed by a 10 Gy boost in 5 fractions rather than hypofractionation given in situ implants.   Narrative:  The patient was contacted today for routine follow-up. During treatment she did very well with radiotherapy and did not have significant desquamation. She reports she feels that skin is heal well.   Impression/Plan: 1. 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 The patient has been doing well since completion of radiotherapy. We discussed that we would be happy to continue to follow her as needed, but she will also continue to follow up with Dr. Burr Medico in medical oncology. She was counseled on skin care as well as measures to avoid sun exposure to this area.  2. Survivorship. We discussed the importance of survivorship evaluation and encouraged her to attend her upcoming visit with that clinic.    Carola Rhine, PAC

## 2019-08-30 ENCOUNTER — Telehealth: Payer: Self-pay | Admitting: *Deleted

## 2019-08-30 NOTE — Telephone Encounter (Signed)
LM: Bone density shows osteopenia, not high risk for fracture, encouraged weight bearing exercise, continue calcium and Vitamin D

## 2019-08-31 ENCOUNTER — Inpatient Hospital Stay: Payer: 59 | Attending: Hematology

## 2019-08-31 ENCOUNTER — Inpatient Hospital Stay: Payer: 59

## 2019-08-31 ENCOUNTER — Other Ambulatory Visit: Payer: Self-pay

## 2019-08-31 VITALS — BP 130/86 | HR 69 | Temp 98.1°F | Resp 16 | Ht 63.0 in | Wt 173.2 lb

## 2019-08-31 DIAGNOSIS — Z5112 Encounter for antineoplastic immunotherapy: Secondary | ICD-10-CM | POA: Insufficient documentation

## 2019-08-31 DIAGNOSIS — Z170 Estrogen receptor positive status [ER+]: Secondary | ICD-10-CM

## 2019-08-31 DIAGNOSIS — C50411 Malignant neoplasm of upper-outer quadrant of right female breast: Secondary | ICD-10-CM | POA: Diagnosis present

## 2019-08-31 DIAGNOSIS — Z95828 Presence of other vascular implants and grafts: Secondary | ICD-10-CM

## 2019-08-31 LAB — CBC WITH DIFFERENTIAL (CANCER CENTER ONLY)
Abs Immature Granulocytes: 0.01 10*3/uL (ref 0.00–0.07)
Basophils Absolute: 0 10*3/uL (ref 0.0–0.1)
Basophils Relative: 1 %
Eosinophils Absolute: 0.1 10*3/uL (ref 0.0–0.5)
Eosinophils Relative: 3 %
HCT: 41.4 % (ref 36.0–46.0)
Hemoglobin: 14 g/dL (ref 12.0–15.0)
Immature Granulocytes: 0 %
Lymphocytes Relative: 24 %
Lymphs Abs: 0.9 10*3/uL (ref 0.7–4.0)
MCH: 29.8 pg (ref 26.0–34.0)
MCHC: 33.8 g/dL (ref 30.0–36.0)
MCV: 88.1 fL (ref 80.0–100.0)
Monocytes Absolute: 0.5 10*3/uL (ref 0.1–1.0)
Monocytes Relative: 12 %
Neutro Abs: 2.3 10*3/uL (ref 1.7–7.7)
Neutrophils Relative %: 60 %
Platelet Count: 166 10*3/uL (ref 150–400)
RBC: 4.7 MIL/uL (ref 3.87–5.11)
RDW: 12 % (ref 11.5–15.5)
WBC Count: 3.9 10*3/uL — ABNORMAL LOW (ref 4.0–10.5)
nRBC: 0 % (ref 0.0–0.2)

## 2019-08-31 LAB — CMP (CANCER CENTER ONLY)
ALT: 42 U/L (ref 0–44)
AST: 29 U/L (ref 15–41)
Albumin: 3.6 g/dL (ref 3.5–5.0)
Alkaline Phosphatase: 81 U/L (ref 38–126)
Anion gap: 8 (ref 5–15)
BUN: 18 mg/dL (ref 8–23)
CO2: 27 mmol/L (ref 22–32)
Calcium: 10 mg/dL (ref 8.9–10.3)
Chloride: 105 mmol/L (ref 98–111)
Creatinine: 0.82 mg/dL (ref 0.44–1.00)
GFR, Est AFR Am: >60 mL/min (ref >60)
GFR, Estimated: >60 mL/min (ref >60)
Glucose, Bld: 101 mg/dL — ABNORMAL HIGH (ref 70–99)
Potassium: 4.1 mmol/L (ref 3.5–5.1)
Sodium: 140 mmol/L (ref 135–145)
Total Bilirubin: 0.3 mg/dL (ref 0.3–1.2)
Total Protein: 6.6 g/dL (ref 6.5–8.1)

## 2019-08-31 MED ORDER — ACETAMINOPHEN 325 MG PO TABS
ORAL_TABLET | ORAL | Status: AC
  Filled 2019-08-31: qty 2

## 2019-08-31 MED ORDER — HEPARIN SOD (PORK) LOCK FLUSH 100 UNIT/ML IV SOLN
500.0000 [IU] | Freq: Once | INTRAVENOUS | Status: AC | PRN
  Administered 2019-08-31: 500 [IU]
  Filled 2019-08-31: qty 5

## 2019-08-31 MED ORDER — DIPHENHYDRAMINE HCL 25 MG PO CAPS
ORAL_CAPSULE | ORAL | Status: AC
  Filled 2019-08-31: qty 1

## 2019-08-31 MED ORDER — ACETAMINOPHEN 325 MG PO TABS
650.0000 mg | ORAL_TABLET | Freq: Once | ORAL | Status: AC
  Administered 2019-08-31: 650 mg via ORAL

## 2019-08-31 MED ORDER — SODIUM CHLORIDE 0.9% FLUSH
10.0000 mL | INTRAVENOUS | Status: DC | PRN
  Administered 2019-08-31: 10 mL
  Filled 2019-08-31: qty 10

## 2019-08-31 MED ORDER — SODIUM CHLORIDE 0.9 % IV SOLN
Freq: Once | INTRAVENOUS | Status: AC
  Filled 2019-08-31: qty 250

## 2019-08-31 MED ORDER — TRASTUZUMAB-ANNS CHEMO 150 MG IV SOLR
450.0000 mg | Freq: Once | INTRAVENOUS | Status: AC
  Administered 2019-08-31: 450 mg via INTRAVENOUS
  Filled 2019-08-31: qty 21.43

## 2019-08-31 MED ORDER — DIPHENHYDRAMINE HCL 25 MG PO CAPS
25.0000 mg | ORAL_CAPSULE | Freq: Once | ORAL | Status: AC
  Administered 2019-08-31: 25 mg via ORAL

## 2019-08-31 MED ORDER — SODIUM CHLORIDE 0.9% FLUSH
10.0000 mL | Freq: Once | INTRAVENOUS | Status: AC
  Administered 2019-08-31: 10 mL
  Filled 2019-08-31: qty 10

## 2019-08-31 NOTE — Patient Instructions (Signed)

## 2019-09-03 NOTE — Progress Notes (Signed)
  Radiation Oncology         [phone]) [phone] ________________________________  Name: Alicia Hahn MRN: [id number]  Date: 08/01/2019  DOB: [birth year]  End of Treatment Note  Diagnosis:   right-sided breast cancer     Indication for treatment:  Curative       Radiation treatment dates:   06/13/19 - 08/01/19  Site/dose:   The patient initially received a dose of 50.4 Gy in 28 fractions to the breast using whole-breast tangent fields. This was delivered using a 3-D conformal technique. The patient then received a boost to the seroma. This delivered an additional 10 Gy in 5 fractions using a 3-field photon boost technique. The total dose was 60.4 Gy.  Narrative: The patient tolerated radiation treatment relatively well.   The patient had some expected skin irritation as she progressed during treatment.    Plan: The patient has completed radiation treatment. The patient will return to radiation oncology clinic for routine followup in one month. I advised the patient to call or return sooner if they have any questions or concerns related to their recovery or treatment. ________________________________  Jodelle Gross, M.D., Ph.D.

## 2019-09-21 ENCOUNTER — Inpatient Hospital Stay: Payer: 59

## 2019-09-22 ENCOUNTER — Other Ambulatory Visit: Payer: Self-pay

## 2019-09-22 ENCOUNTER — Inpatient Hospital Stay: Payer: 59 | Attending: Hematology

## 2019-09-22 VITALS — BP 119/83 | HR 85 | Temp 98.0°F | Resp 16 | Wt 173.0 lb

## 2019-09-22 DIAGNOSIS — F419 Anxiety disorder, unspecified: Secondary | ICD-10-CM | POA: Insufficient documentation

## 2019-09-22 DIAGNOSIS — F429 Obsessive-compulsive disorder, unspecified: Secondary | ICD-10-CM | POA: Diagnosis not present

## 2019-09-22 DIAGNOSIS — Z5112 Encounter for antineoplastic immunotherapy: Secondary | ICD-10-CM | POA: Diagnosis present

## 2019-09-22 DIAGNOSIS — Z79811 Long term (current) use of aromatase inhibitors: Secondary | ICD-10-CM | POA: Diagnosis not present

## 2019-09-22 DIAGNOSIS — C50411 Malignant neoplasm of upper-outer quadrant of right female breast: Secondary | ICD-10-CM | POA: Insufficient documentation

## 2019-09-22 DIAGNOSIS — Z923 Personal history of irradiation: Secondary | ICD-10-CM | POA: Insufficient documentation

## 2019-09-22 DIAGNOSIS — N951 Menopausal and female climacteric states: Secondary | ICD-10-CM | POA: Diagnosis not present

## 2019-09-22 DIAGNOSIS — Z803 Family history of malignant neoplasm of breast: Secondary | ICD-10-CM | POA: Diagnosis not present

## 2019-09-22 DIAGNOSIS — Z170 Estrogen receptor positive status [ER+]: Secondary | ICD-10-CM | POA: Diagnosis not present

## 2019-09-22 MED ORDER — SODIUM CHLORIDE 0.9 % IV SOLN
Freq: Once | INTRAVENOUS | Status: AC
  Filled 2019-09-22: qty 250

## 2019-09-22 MED ORDER — HEPARIN SOD (PORK) LOCK FLUSH 100 UNIT/ML IV SOLN
500.0000 [IU] | Freq: Once | INTRAVENOUS | Status: AC | PRN
  Administered 2019-09-22: 500 [IU]
  Filled 2019-09-22: qty 5

## 2019-09-22 MED ORDER — DIPHENHYDRAMINE HCL 25 MG PO CAPS
ORAL_CAPSULE | ORAL | Status: AC
  Filled 2019-09-22: qty 1

## 2019-09-22 MED ORDER — DIPHENHYDRAMINE HCL 25 MG PO CAPS
25.0000 mg | ORAL_CAPSULE | Freq: Once | ORAL | Status: AC
  Administered 2019-09-22: 25 mg via ORAL

## 2019-09-22 MED ORDER — ACETAMINOPHEN 325 MG PO TABS
650.0000 mg | ORAL_TABLET | Freq: Once | ORAL | Status: AC
  Administered 2019-09-22: 650 mg via ORAL

## 2019-09-22 MED ORDER — SODIUM CHLORIDE 0.9% FLUSH
10.0000 mL | INTRAVENOUS | Status: DC | PRN
  Administered 2019-09-22: 10 mL
  Filled 2019-09-22: qty 10

## 2019-09-22 MED ORDER — ACETAMINOPHEN 325 MG PO TABS
ORAL_TABLET | ORAL | Status: AC
  Filled 2019-09-22: qty 2

## 2019-09-22 MED ORDER — TRASTUZUMAB-ANNS CHEMO 150 MG IV SOLR
450.0000 mg | Freq: Once | INTRAVENOUS | Status: AC
  Administered 2019-09-22: 450 mg via INTRAVENOUS
  Filled 2019-09-22: qty 21.43

## 2019-09-22 NOTE — Patient Instructions (Signed)
[facility] Discharge Instructions for Patients Receiving Chemotherapy  Today you received the following chemotherapy agents trastuzumab.  To help prevent nausea and vomiting after your treatment, we encourage you to take your nausea medication as directed.    If you develop nausea and vomiting that is not controlled by your nausea medication, call the clinic.   BELOW ARE SYMPTOMS THAT SHOULD BE REPORTED IMMEDIATELY:  *FEVER GREATER THAN 100.5 F  *CHILLS WITH OR WITHOUT FEVER  NAUSEA AND VOMITING THAT IS NOT CONTROLLED WITH YOUR NAUSEA MEDICATION  *UNUSUAL SHORTNESS OF BREATH  *UNUSUAL BRUISING OR BLEEDING  TENDERNESS IN MOUTH AND THROAT WITH OR WITHOUT PRESENCE OF ULCERS  *URINARY PROBLEMS  *BOWEL PROBLEMS  UNUSUAL RASH Items with * indicate a potential emergency and should be followed up as soon as possible.  Feel free to call the clinic should you have any questions or concerns. The clinic phone number is [phone].  Please show the CHEMO ALERT CARD at check-in to the Emergency Department and triage nurse.   

## 2019-09-27 NOTE — Progress Notes (Signed)
CLINIC:  Survivorship   Patient Care Team: Lorrene Reid, PA-C as PCP - General Bensimhon, Shaune Pascal, MD as PCP - Advanced Heart Failure (Cardiology) Mcarthur Rossetti, MD as Consulting Physician (Orthopedic Surgery) Molli Posey, MD as Consulting Physician (Obstetrics and Gynecology) Erroll Luna, MD as Consulting Physician (General Surgery) Truitt Merle, MD as Consulting Physician (Hematology) Kyung Rudd, MD as Consulting Physician (Radiation Oncology) Mauro Kaufmann, RN as Oncology Nurse Navigator Rockwell Germany, RN as Oncology Nurse Navigator Alla Feeling, NP as Nurse Practitioner (Nurse Practitioner)   REASON FOR VISIT:  Routine follow-up post-treatment for a recent history of breast cancer  BRIEF ONCOLOGIC HISTORY:  Oncology History Overview Note  Cancer Staging Malignant neoplasm of upper-outer quadrant of right breast in female, estrogen receptor positive ([place]) Staging form: Breast, AJCC 8th Edition - Clinical stage from 12/29/2018: Stage IA (cT1b, cN0, cM0, G2, ER+, PR+, HER2+) - Unsigned - Pathologic stage from 01/27/2019: Stage IA (pT1c, pN0, cM0, G3, ER+, PR+, HER2+) - Signed by Truitt Merle, MD on 02/06/2019 Cancer Staging No matching staging information was found for the patient.    Malignant neoplasm of upper-outer quadrant of right breast in female, estrogen receptor positive ([place])  12/22/2018 Mammogram    Diagnostic Mammogram 12/22/18  IMPRESSION: 1. 9 mm mass, with associated calcifications and architectural distortion, in the 11 o'clock position of the right breast, highly suspicious for breast carcinoma. 2. Questionable mass noted in the inferior aspect of the right breast remains equivocal on diagnostic imaging may reflect focal fibroglandular tissue. There is no sonographic correlate.   12/23/2018 Initial Biopsy   Diagnosis 12/23/18 Breast, right, needle core biopsy, 11 o'clock position - INVASIVE DUCTAL CARCINOMA, SEE COMMENT. - DUCTAL  CARCINOMA IN SITU WITH NECROSIS.   12/28/2018 Initial Diagnosis   Malignant neoplasm of upper-outer quadrant of right breast in female, estrogen receptor positive ([place])   01/13/2019 Imaging   MRI Breast 01/13/19  IMPRESSION: 1. Biopsy proven malignancy in the upper-outer right breast. No additional suspicious findings on the right. 2. No MRI evidence of malignancy on the left. 3. No suspicious lymphadenopathy.   01/27/2019 Surgery   RIGHT BREAST LUMPECTOMY WITH RADIOACTIVE SEED AND SENTINEL LYMPH NODE MAPPING and PAC placement by Dr Brantley Stage  01/27/19   01/27/2019 Pathology Results   FINAL MICROSCOPIC DIAGNOSIS: 01/27/19  A. BREAST, RIGHT, LUMPECTOMY:  - Invasive ductal carcinoma, grade 3, spanning 1.3 cm.  - High grade ductal carcinoma in situ with necrosis.  - Biopsy site.  - Final resection margins are negative for carcinoma.  - See oncology table.   B. BREAST, RIGHT ADDITIONAL SUPERIOR MARGIN, EXCISION:  - Fibrocystic change and adenosis.  - No malignancy identified.   C. LYMPH NODE, RIGHT #1, SENTINEL, BIOPSY:  - One of one lymph nodes negative for carcinoma (0/1).   D. LYMPH NODE, RIGHT, SENTINEL, BIOPSY:  - One of one lymph nodes negative for carcinoma (0/1).   E. LYMPH NODE, RIGHT, SENTINEL, BIOPSY:  - One of one lymph nodes negative for carcinoma (0/1).   F. LYMPH NODE, RIGHT, SENTINEL, BIOPSY:  - One of one lymph nodes negative for carcinoma (0/1).       01/27/2019 Cancer Staging   Staging form: Breast, AJCC 8th Edition - Pathologic stage from 01/27/2019: Stage IA (pT1c, pN0, cM0, G3, ER+, PR+, HER2+) - Signed by Truitt Merle, MD on 02/06/2019   02/08/2019 Imaging   CT AP W Contrast 02/08/19  IMPRESSION: 1. Wall thickening of the gastric antrum may represent gastritis.  Aortic Atherosclerosis (ICD10-I70.0).   02/23/2019 -  Chemotherapy   Adjuvant weekly Abraxane with Herceptin q3weeks for 12 cycles 02/23/19-05/11/19. Maintenance Herceptin q3weeks start 05/18/19 to complete  1 year of treatment from 02/2019. Her insurance denied the injections.    06/13/2019 - 08/01/2019 Radiation Therapy   Radiation with Dr Lisbeth Renshaw starting 06/13/19-08/01/19   08/2019 -  Anti-estrogen oral therapy   Letrozole, beginning 08/25/2019    09/28/2019 Survivorship   SCP delivered by Cira Rue, NP     CURRENT THERAPY: Completed adjuvant radiation, continues maintenance Herceptin every 3 weeks.  Adjuvant endocrine therapy letrozole starting 08/2019  INTERVAL HISTORY:  Ms. Bartelt presents to the [facility] today for our initial meeting to review her survivorship care plan detailing her treatment course for breast cancer, as well as monitoring long-term side effects of that treatment, education regarding health maintenance, screening, and overall wellness and health promotion.     Overall, Ms. Swarey reports feeling quite well since completing her radiation therapy, breast is still a little dark but no other concerns.  She continues every 3 weeks Herceptin, last cycle on 09/22/2019, she denies any side effects.  She started letrozole in 08/2019 which is going "okay."  She has general musculoskeletal aches which she had before AI, not new or worse.  She has moderate hot flashes and vaginal dryness without any GYN bleeding.  She is using nonestrogen lubricants.  Energy and appetite are adequate.  Denies changes in her bowel habits, recent fever or chills, cough, chest pain, dyspnea.  She has anxiety which manifests in OCD types of behaviors like cleaning out her house.  She exercises to try to help channel her energy.  She is interested in medication.  [facility] "normal spine and osteopenia in the hips."  ONCOLOGY TREATMENT TEAM:  1. Surgeon:  Dr. [facility]ley Stage at [facility] 2. Medical Oncologist: Dr. Burr Medico 3. Radiation Oncologist: Dr. Lisbeth Renshaw    PAST MEDICAL/SURGICAL HISTORY:  Past Medical History:  Diagnosis Date  . Cancer [facility])     breast cancer  . Gastric ulcer   . Hyperlipidemia    Past Surgical History:  Procedure Laterality Date  . BREAST LUMPECTOMY WITH RADIOACTIVE SEED AND SENTINEL LYMPH NODE BIOPSY Right 01/27/2019   Procedure: RIGHT BREAST LUMPECTOMY WITH RADIOACTIVE SEED AND SENTINEL LYMPH NODE MAPPING;  Surgeon: Erroll Luna, MD;  Location: [place];  Service: General;  Laterality: Right;  . COSMETIC SURGERY Bilateral 2000   breast aug/ lipo  . PORTACATH PLACEMENT Right 01/27/2019   Procedure: INSERTION PORT-A-CATH WITH ULTRASOUND;  Surgeon: Erroll Luna, MD;  Location: [place];  Service: General;  Laterality: Right;     ALLERGIES:  No Known Allergies   CURRENT MEDICATIONS:  Outpatient Encounter Medications as of 09/28/2019  Medication Sig  . CALCIUM PO Take 1,000 mg by mouth daily.  Marland Kitchen letrozole (FEMARA) 2.5 MG tablet Take 1 tablet (2.5 mg total) by mouth daily.  . Magnesium 125 MG CAPS Take 1 capsule by mouth daily.   . Melatonin 3 MG CAPS Take 3 mg by mouth at bedtime.   . Multiple Vitamins-Minerals (MULTIVITAMIN WITH MINERALS) tablet Take 1 tablet by mouth daily.  . traZODone (DESYREL) 50 MG tablet Take 1 tablet (50 mg total) by mouth at bedtime as needed for sleep.  Marland Kitchen venlafaxine XR (EFFEXOR-XR) 37.5 MG 24 hr capsule Take 1 capsule (37.5 mg total) by mouth daily with breakfast.  . VITAMIN D PO  Take 12,000 Units by mouth daily.  . [DISCONTINUED] prochlorperazine (COMPAZINE) 10 MG tablet Take 1 tablet (10 mg total) by mouth every 6 (six) hours as needed (Nausea or vomiting).   No facility-administered encounter medications on file as of 09/28/2019.     ONCOLOGIC FAMILY HISTORY:  Family History  Problem Relation Age of Onset  . Diabetes Mother   . Heart disease Mother   . Hyperlipidemia Mother   . Breast cancer Mother 8  . Cancer Mother        breast DCIS  . Cancer Father 6       prostate cancer   . Diabetes Father   . Breast cancer Sister 7  .  Cancer Sister 47       breast cancer   . Cancer Maternal Aunt 55       breast cancer   . Cancer Maternal Grandfather        colon cancer  . Colon cancer Maternal Grandfather   . Esophageal cancer Neg Hx   . Rectal cancer Neg Hx   . Stomach cancer Neg Hx      GENETIC COUNSELING/TESTING: Yes, VUS ATM and 30.5% lifetime breast cancer risk.  We discussed the implications of this today.   SOCIAL HISTORY:  Treacy Holcomb is married and lives in [place].  She smokes cigarettes in college but quit more than 40 years ago.   PHYSICAL EXAMINATION:  Vital Signs:   Vitals:   09/28/19 0947  BP: (!) 145/93  Pulse: 75  Resp: 18  Temp: (!) 96.8 F (36 C)  SpO2: 100%   Filed Weights   09/28/19 0947  Weight: 174 lb 11.2 oz (79.2 kg)   General: Well-nourished, well-appearing female in no acute distress.   HEENT:  Sclerae anicteric. Oral mucosa is pink, moist.  Oropharynx is pink without lesions or erythema.  Respiratory:  breathing non-labored.  Neuro:  Steady gait.  Psych: Mood and affect normal and appropriate for situation.  Extremities: No edema. Skin: No rash to exposed skin Breast exam deferred PAC without erythema Limited exam in the setting of COVID-19 pandemic and no complaints  LABORATORY DATA:  CBC Latest Ref Rng & Units 08/31/2019 08/10/2019 06/08/2019  WBC 4.0 - 10.5 K/uL 3.9(L) 3.8(L) 5.8  Hemoglobin 12.0 - 15.0 g/dL 14.0 13.9 14.0  Hematocrit 36 - 46 % 41.4 40.8 42.8  Platelets 150 - 400 K/uL 166 167 203   CMP Latest Ref Rng & Units 08/31/2019 08/10/2019 06/08/2019  Glucose 70 - 99 mg/dL 101(H) 109(H) 95  BUN 8 - 23 mg/dL _0 Creatinine 0.44 - 1.00 mg/dL 0.82 0.97 0.90  Sodium 135 - 145 mmol/L 140 139 142  Potassium 3.5 - 5.1 mmol/L 4.1 3.9 4.4  Chloride 98 - 111 mmol/L 105 107 107  CO2 22 - 32 mmol/L _1 Calcium 8.9 - 10.3 mg/dL 10.0 9.7 9.6  Total Protein 6.5 - 8.1 g/dL 6.6 6.5 6.9  Total Bilirubin 0.3 - 1.2 mg/dL 0.3 0.3 0.3  Alkaline  Phos 38 - 126 U/L 81 74 82  AST 15 - 41 U/L 29 32 64(H)  ALT 0 - 44 U/L 42 52(H) 134(H)    DIAGNOSTIC IMAGING:  None for this visit.      ASSESSMENT AND PLAN:  Ms.. Shimmin is a pleasant 59 y.o. female with Stage 1A right breast invasive ductal carcinoma, ER+/PR+/HER2+, diagnosed in 11/2018 treated with lumpectomy, adjuvant chemotherapy, radiation therapy, and anti-estrogen therapy with letrozole starting  in 08/2019.  She presents to the Survivorship Clinic for our initial meeting and routine follow-up post-completion of treatment for breast cancer.    1. Stage 1A right breast cancer:  Ms. Graybeal has recovered well from definitive treatment for breast cancer.  She continues every 3 weeks maintenance Herceptin until 02/2020.  She will follow-up with her medical oncologist, Dr. Burr Medico or me with her treatment in 5 weeks with history and physical exam per surveillance protocol.  She will continue her anti-estrogen therapy with letrozole. Thus far, she is tolerating moderately well with stable mild pre-- existing joint aches. She also has moderate hot flashes and vaginal dryness.  We reviewed symptom management. She was instructed to make Dr. Burr Medico or myself aware if she begins to experience any worsening side effects of the medication and I could see her back in clinic to help manage those side effects, as needed. Today, a comprehensive survivorship care plan and treatment summary was reviewed with the patient today detailing her breast cancer diagnosis, treatment course, potential late/long-term effects of treatment, appropriate follow-up care with recommendations for the future, and patient education resources.  A copy of this summary will be sent to the patient's primary care provider via In Basket message after today's visit.    2.  Anxiety/OCD -her anxiety manifests in OCD type behavior ie cleaning, does not interfere with her daily life.  She exercises to help this situation. She is interested in  medication, given her AI-induced hot flashes I recommend effexor. Side effects reviewed including possible DDI with trazadone, she uses this sparingly for sleep but not recently. She agrees to try.   3. Bone health:  Given Ms. Yabut's age/history of breast cancer and her current treatment regimen including anti-estrogen therapy with letrozole, she is at risk for bone demineralization.  Her last [facility] was few weeks ago (summer 2021) [facility], reportedly osteopenia in the hips. I will request the report.  She is encouraged to continue calcium and vitamin D as well as increase her weight-bearing activities.  She was given education on specific activities to promote bone health.  4. Cancer screening:  Due to Ms. Pinkney's history and her age, she should receive screening for skin cancers, colon cancer, and gynecologic cancers.  The information and recommendations are listed on the patient's comprehensive care plan/treatment summary and were reviewed in detail with the patient.    5. Health maintenance and wellness promotion: Ms. Mckeever was encouraged to consume 5-7 servings of fruits and vegetables per day. We reviewed the "Nutrition Rainbow" handout, as well as the handout "Take Control of Your Health and Reduce Your Cancer Risk" from the [place].  She was also encouraged to engage in moderate to vigorous exercise for 30 minutes per day most days of the week. We discussed the [employer] fitness program, which is designed for cancer survivors to help them become more physically fit after cancer treatments.  She was instructed to limit her alcohol consumption and continue to abstain from tobacco use.     6. Support services/counseling: It is not uncommon for this period of the patient's cancer care trajectory to be one of many emotions and stressors.  We discussed an opportunity for her to participate in the next session of [facility] ("Finding Your New Normal") support  group series designed for patients after they have completed treatment.   Ms. Charbonneau was encouraged to take advantage of our many other support services programs, support groups, and/or counseling in  coping with her new life as a cancer survivor after completing anti-cancer treatment.  She was offered support today through active listening and expressive supportive counseling.  She was given information regarding our available services and encouraged to contact me with any questions or for help enrolling in any of our support group/programs.    Dispo:   -Return to cancer center every 3 weeks for maintenance Herceptin until 20/2022, follow-up in 5 weeks -Continue letrozole.  Begin Effexor 37.5 mg once daily for anxiety and hot flashes -We [facility] -Mammogram due in 11/2019, given her breast density category B and high lifetime risk of cancer she may also benefit from screening MRI in the future.  She is interested -Follow up with surgery as indicated -She is welcome to return back to the Survivorship Clinic at any time; no additional follow-up needed at this time.  -Consider referral back to survivorship as a long-term survivor for continued surveillance  A total of (30) minutes of face-to-face time was spent with this patient with greater than 50% of that time in counseling and care-coordination.   Cira Rue, NP Survivorship Program [facility] [phone]   Note: PRIMARY CARE PROVIDER Lorrene Reid, [place] [phone]

## 2019-09-28 ENCOUNTER — Encounter: Payer: Self-pay | Admitting: Nurse Practitioner

## 2019-09-28 ENCOUNTER — Inpatient Hospital Stay (HOSPITAL_BASED_OUTPATIENT_CLINIC_OR_DEPARTMENT_OTHER): Payer: 59 | Admitting: Nurse Practitioner

## 2019-09-28 ENCOUNTER — Telehealth: Payer: Self-pay | Admitting: Nurse Practitioner

## 2019-09-28 ENCOUNTER — Other Ambulatory Visit: Payer: Self-pay

## 2019-09-28 VITALS — BP 145/93 | HR 75 | Temp 96.8°F | Resp 18 | Ht 63.0 in | Wt 174.7 lb

## 2019-09-28 DIAGNOSIS — Z170 Estrogen receptor positive status [ER+]: Secondary | ICD-10-CM

## 2019-09-28 DIAGNOSIS — C50411 Malignant neoplasm of upper-outer quadrant of right female breast: Secondary | ICD-10-CM | POA: Diagnosis not present

## 2019-09-28 DIAGNOSIS — Z5112 Encounter for antineoplastic immunotherapy: Secondary | ICD-10-CM | POA: Diagnosis not present

## 2019-09-28 MED ORDER — VENLAFAXINE HCL ER 37.5 MG PO CP24: 37.5000 mg | ORAL_CAPSULE | Freq: Every day | ORAL | 3 refills | Status: DC

## 2019-09-28 NOTE — Telephone Encounter (Signed)
Scheduled appointments per 9/8 los. Patient is aware of all appointments. Patient declined calendar print out.

## 2019-10-04 ENCOUNTER — Other Ambulatory Visit: Payer: 59

## 2019-10-06 ENCOUNTER — Encounter: Payer: 59 | Admitting: Nurse Practitioner

## 2019-10-11 ENCOUNTER — Encounter: Payer: Self-pay | Admitting: *Deleted

## 2019-10-12 ENCOUNTER — Inpatient Hospital Stay: Payer: 59

## 2019-10-12 ENCOUNTER — Other Ambulatory Visit: Payer: Self-pay

## 2019-10-12 VITALS — BP 128/68 | HR 68 | Temp 98.3°F | Resp 18 | Wt 171.5 lb

## 2019-10-12 DIAGNOSIS — Z170 Estrogen receptor positive status [ER+]: Secondary | ICD-10-CM

## 2019-10-12 DIAGNOSIS — Z5112 Encounter for antineoplastic immunotherapy: Secondary | ICD-10-CM | POA: Diagnosis not present

## 2019-10-12 DIAGNOSIS — C50411 Malignant neoplasm of upper-outer quadrant of right female breast: Secondary | ICD-10-CM

## 2019-10-12 DIAGNOSIS — Z95828 Presence of other vascular implants and grafts: Secondary | ICD-10-CM

## 2019-10-12 LAB — CBC WITH DIFFERENTIAL (CANCER CENTER ONLY)
Abs Immature Granulocytes: 0.01 10*3/uL (ref 0.00–0.07)
Basophils Absolute: 0 10*3/uL (ref 0.0–0.1)
Basophils Relative: 1 %
Eosinophils Absolute: 0.1 10*3/uL (ref 0.0–0.5)
Eosinophils Relative: 3 %
HCT: 38 % (ref 36.0–46.0)
Hemoglobin: 12.8 g/dL (ref 12.0–15.0)
Immature Granulocytes: 0 %
Lymphocytes Relative: 23 %
Lymphs Abs: 0.9 10*3/uL (ref 0.7–4.0)
MCH: 29.6 pg (ref 26.0–34.0)
MCHC: 33.7 g/dL (ref 30.0–36.0)
MCV: 88 fL (ref 80.0–100.0)
Monocytes Absolute: 0.5 10*3/uL (ref 0.1–1.0)
Monocytes Relative: 14 %
Neutro Abs: 2.2 10*3/uL (ref 1.7–7.7)
Neutrophils Relative %: 59 %
Platelet Count: 149 10*3/uL — ABNORMAL LOW (ref 150–400)
RBC: 4.32 MIL/uL (ref 3.87–5.11)
RDW: 12.7 % (ref 11.5–15.5)
WBC Count: 3.7 10*3/uL — ABNORMAL LOW (ref 4.0–10.5)
nRBC: 0 % (ref 0.0–0.2)

## 2019-10-12 LAB — CMP (CANCER CENTER ONLY)
ALT: 37 U/L (ref 0–44)
AST: 21 U/L (ref 15–41)
Albumin: 3.3 g/dL — ABNORMAL LOW (ref 3.5–5.0)
Alkaline Phosphatase: 81 U/L (ref 38–126)
Anion gap: 3 — ABNORMAL LOW (ref 5–15)
BUN: 14 mg/dL (ref 8–23)
CO2: 30 mmol/L (ref 22–32)
Calcium: 9.1 mg/dL (ref 8.9–10.3)
Chloride: 106 mmol/L (ref 98–111)
Creatinine: 0.71 mg/dL (ref 0.44–1.00)
GFR, Est AFR Am: >60 mL/min (ref >60)
GFR, Estimated: >60 mL/min (ref >60)
Glucose, Bld: 114 mg/dL — ABNORMAL HIGH (ref 70–99)
Potassium: 4.3 mmol/L (ref 3.5–5.1)
Sodium: 139 mmol/L (ref 135–145)
Total Bilirubin: 0.4 mg/dL (ref 0.3–1.2)
Total Protein: 6.2 g/dL — ABNORMAL LOW (ref 6.5–8.1)

## 2019-10-12 MED ORDER — HEPARIN SOD (PORK) LOCK FLUSH 100 UNIT/ML IV SOLN
500.0000 [IU] | Freq: Once | INTRAVENOUS | Status: AC | PRN
  Administered 2019-10-12: 500 [IU]
  Filled 2019-10-12: qty 5

## 2019-10-12 MED ORDER — ACETAMINOPHEN 325 MG PO TABS
ORAL_TABLET | ORAL | Status: AC
  Filled 2019-10-12: qty 2

## 2019-10-12 MED ORDER — SODIUM CHLORIDE 0.9 % IV SOLN
Freq: Once | INTRAVENOUS | Status: AC
  Filled 2019-10-12: qty 250

## 2019-10-12 MED ORDER — DIPHENHYDRAMINE HCL 25 MG PO CAPS
25.0000 mg | ORAL_CAPSULE | Freq: Once | ORAL | Status: AC
  Administered 2019-10-12: 25 mg via ORAL

## 2019-10-12 MED ORDER — SODIUM CHLORIDE 0.9% FLUSH
10.0000 mL | INTRAVENOUS | Status: DC | PRN
  Administered 2019-10-12: 10 mL
  Filled 2019-10-12: qty 10

## 2019-10-12 MED ORDER — ACETAMINOPHEN 325 MG PO TABS
650.0000 mg | ORAL_TABLET | Freq: Once | ORAL | Status: AC
  Administered 2019-10-12: 650 mg via ORAL

## 2019-10-12 MED ORDER — TRASTUZUMAB-ANNS CHEMO 150 MG IV SOLR
450.0000 mg | Freq: Once | INTRAVENOUS | Status: AC
  Administered 2019-10-12: 450 mg via INTRAVENOUS
  Filled 2019-10-12: qty 21.43

## 2019-10-12 MED ORDER — SODIUM CHLORIDE 0.9% FLUSH
10.0000 mL | Freq: Once | INTRAVENOUS | Status: AC
  Administered 2019-10-12: 10 mL
  Filled 2019-10-12: qty 10

## 2019-10-12 MED ORDER — DIPHENHYDRAMINE HCL 25 MG PO CAPS
ORAL_CAPSULE | ORAL | Status: AC
  Filled 2019-10-12: qty 1

## 2019-10-12 NOTE — Patient Instructions (Signed)

## 2019-10-12 NOTE — Patient Instructions (Signed)
[facility] Discharge Instructions for Patients Receiving Chemotherapy  Today you received the following chemotherapy agents trastuzumab.  To help prevent nausea and vomiting after your treatment, we encourage you to take your nausea medication as directed.    If you develop nausea and vomiting that is not controlled by your nausea medication, call the clinic.   BELOW ARE SYMPTOMS THAT SHOULD BE REPORTED IMMEDIATELY:  *FEVER GREATER THAN 100.5 F  *CHILLS WITH OR WITHOUT FEVER  NAUSEA AND VOMITING THAT IS NOT CONTROLLED WITH YOUR NAUSEA MEDICATION  *UNUSUAL SHORTNESS OF BREATH  *UNUSUAL BRUISING OR BLEEDING  TENDERNESS IN MOUTH AND THROAT WITH OR WITHOUT PRESENCE OF ULCERS  *URINARY PROBLEMS  *BOWEL PROBLEMS  UNUSUAL RASH Items with * indicate a potential emergency and should be followed up as soon as possible.  Feel free to call the clinic should you have any questions or concerns. The clinic phone number is [phone].  Please show the CHEMO ALERT CARD at check-in to the Emergency Department and triage nurse.   

## 2019-10-28 NOTE — Progress Notes (Signed)
Alicia Hahn   [phone] [phone]   Clinic Follow up Note   Patient Care Team: Lorrene Reid, PA-C as PCP - General Bensimhon, Shaune Pascal, MD as PCP - Advanced Heart Failure (Cardiology) Mcarthur Rossetti, MD as Consulting Physician (Orthopedic Surgery) Molli Posey, MD as Consulting Physician (Obstetrics and Gynecology) Erroll Luna, MD as Consulting Physician (General Surgery) Truitt Merle, MD as Consulting Physician (Hematology) Kyung Rudd, MD as Consulting Physician (Radiation Oncology) Mauro Kaufmann, RN as Oncology Nurse Navigator Rockwell Germany, RN as Oncology Nurse Navigator Alla Feeling, NP as Nurse Practitioner (Nurse Practitioner)  Date of Service:  11/02/2019  CHIEF COMPLAINT: Follow-up right breast cancer  SUMMARY OF ONCOLOGIC HISTORY: Oncology History Overview Note  Cancer Staging Malignant neoplasm of upper-outer quadrant of right breast in female, estrogen receptor positive ([place]) Staging form: Breast, AJCC 8th Edition - Clinical stage from 12/29/2018: Stage IA (cT1b, cN0, cM0, G2, ER+, PR+, HER2+) - Unsigned - Pathologic stage from 01/27/2019: Stage IA (pT1c, pN0, cM0, G3, ER+, PR+, HER2+) - Signed by Truitt Merle, MD on 02/06/2019 Cancer Staging No matching staging information was found for the patient.    Malignant neoplasm of upper-outer quadrant of right breast in female, estrogen receptor positive ([place])  12/22/2018 Mammogram    Diagnostic Mammogram 12/22/18  IMPRESSION: 1. 9 mm mass, with associated calcifications and architectural distortion, in the 11 o'clock position of the right breast, highly suspicious for breast carcinoma. 2. Questionable mass noted in the inferior aspect of the right breast remains equivocal on diagnostic imaging may reflect focal fibroglandular tissue. There is no sonographic correlate.   12/23/2018 Initial Biopsy   Diagnosis 12/23/18 Breast, right, needle core biopsy, 11 o'clock  position - INVASIVE DUCTAL CARCINOMA, SEE COMMENT. - DUCTAL CARCINOMA IN SITU WITH NECROSIS.   12/28/2018 Initial Diagnosis   Malignant neoplasm of upper-outer quadrant of right breast in female, estrogen receptor positive ([place])   01/13/2019 Imaging   MRI Breast 01/13/19  IMPRESSION: 1. Biopsy proven malignancy in the upper-outer right breast. No additional suspicious findings on the right. 2. No MRI evidence of malignancy on the left. 3. No suspicious lymphadenopathy.   01/27/2019 Surgery   RIGHT BREAST LUMPECTOMY WITH RADIOACTIVE SEED AND SENTINEL LYMPH NODE MAPPING and PAC placement by Dr Brantley Stage  01/27/19   01/27/2019 Pathology Results   FINAL MICROSCOPIC DIAGNOSIS: 01/27/19  A. BREAST, RIGHT, LUMPECTOMY:  - Invasive ductal carcinoma, grade 3, spanning 1.3 cm.  - High grade ductal carcinoma in situ with necrosis.  - Biopsy site.  - Final resection margins are negative for carcinoma.  - See oncology table.   B. BREAST, RIGHT ADDITIONAL SUPERIOR MARGIN, EXCISION:  - Fibrocystic change and adenosis.  - No malignancy identified.   C. LYMPH NODE, RIGHT #1, SENTINEL, BIOPSY:  - One of one lymph nodes negative for carcinoma (0/1).   D. LYMPH NODE, RIGHT, SENTINEL, BIOPSY:  - One of one lymph nodes negative for carcinoma (0/1).   E. LYMPH NODE, RIGHT, SENTINEL, BIOPSY:  - One of one lymph nodes negative for carcinoma (0/1).   F. LYMPH NODE, RIGHT, SENTINEL, BIOPSY:  - One of one lymph nodes negative for carcinoma (0/1).       01/27/2019 Cancer Staging   Staging form: Breast, AJCC 8th Edition - Pathologic stage from 01/27/2019: Stage IA (pT1c, pN0, cM0, G3, ER+, PR+, HER2+) - Signed by Truitt Merle, MD on 02/06/2019   02/08/2019 Imaging   CT AP W Contrast 02/08/19  IMPRESSION: 1. Wall  thickening of the gastric antrum may represent gastritis.   Aortic Atherosclerosis (ICD10-I70.0).   02/23/2019 -  Chemotherapy   Adjuvant weekly Abraxane with trastuzumab-anns (Kanjinti) q3weeks  starting 02/23/19. Completed 12 week of Abraxane on 05/11/19. Maintenance trastuzumab-anns (Kanjinti) q3weeks start 05/18/19. Plan to complete 01/25/19.   06/13/2019 - 08/01/2019 Radiation Therapy   Radiation with Dr Lisbeth Renshaw starting 06/13/19-08/01/19   08/2019 -  Anti-estrogen oral therapy   Letrozole, beginning 08/25/2019    09/28/2019 Survivorship   SCP delivered by Cira Rue, NP       CURRENT THERAPY:  -Adjuvant weekly Abraxane with trastuzumab-anns (Kanjinti) q3weeks starting2/3/21. Completed 12 week of Abraxane on 05/11/19. Maintenance trastuzumab-anns (Kanjinti) q3weeks start 05/18/19. Plan to complete 01/25/19. -Letrozole once daily starting 08/25/19  INTERVAL HISTORY:  Alicia Hahn is here for a follow up. She presents to the clinic alone. She notes she is doing well. She is tolerating Kanjinti treatments. She is also tolerated Letrozole with stable baseline arthritis. She notes she has vaginal atrophy and wonders if she uses topical vignal creams. She notes her atrophy is painful with or without intercourse. She notes she uses lubrication. She notes she has been on Effexor but has not helped her hot flashes. She plans to have her eyebrows microbladed.     REVIEW OF SYSTEMS:   Constitutional: Denies fevers, chills or abnormal weight loss (+) hot flashes  Eyes: Denies blurriness of vision Ears, nose, mouth, throat, and face: Denies mucositis or sore throat Respiratory: Denies cough, dyspnea or wheezes GYN: (+) Vaginal atrophy  Cardiovascular: Denies palpitation, chest discomfort or lower extremity swelling Gastrointestinal:  Denies nausea, heartburn or change in bowel habits Skin: Denies abnormal skin rashes MSK: (+) Stable arthritis  Lymphatics: Denies new lymphadenopathy or easy bruising Neurological:Denies numbness, tingling or new weaknesses Behavioral/Psych: Mood is stable, no new changes  All other systems were reviewed with the patient and are negative.  MEDICAL HISTORY:    Past Medical History:  Diagnosis Date  . Cancer [facility])    breast cancer  . Gastric ulcer   . Hyperlipidemia     SURGICAL HISTORY: Past Surgical History:  Procedure Laterality Date  . BREAST LUMPECTOMY WITH RADIOACTIVE SEED AND SENTINEL LYMPH NODE BIOPSY Right 01/27/2019   Procedure: RIGHT BREAST LUMPECTOMY WITH RADIOACTIVE SEED AND SENTINEL LYMPH NODE MAPPING;  Surgeon: Erroll Luna, MD;  Location: [facility];  Service: General;  Laterality: Right;  . COSMETIC SURGERY Bilateral 2000   breast aug/ lipo  . PORTACATH PLACEMENT Right 01/27/2019   Procedure: INSERTION PORT-A-CATH WITH ULTRASOUND;  Surgeon: Erroll Luna, MD;  Location: [facility];  Service: General;  Laterality: Right;    I have reviewed the social history and family history with the patient and they are unchanged from previous note.  ALLERGIES:  has No Known Allergies.  MEDICATIONS:  Current Outpatient Medications  Medication Sig Dispense Refill  . CALCIUM PO Take 1,000 mg by mouth daily.    Marland Kitchen letrozole (FEMARA) 2.5 MG tablet Take 1 tablet (2.5 mg total) by mouth daily. 90 tablet 3  . Magnesium 125 MG CAPS Take 1 capsule by mouth daily.     . Melatonin 3 MG CAPS Take 3 mg by mouth at bedtime.     . Multiple Vitamins-Minerals (MULTIVITAMIN WITH MINERALS) tablet Take 1 tablet by mouth daily.    . traZODone (DESYREL) 50 MG tablet Take 1 tablet (50 mg total) by mouth at bedtime as needed for sleep. 90 tablet 1  . venlafaxine XR (  EFFEXOR-XR) 37.5 MG 24 hr capsule Take 1 capsule (37.5 mg total) by mouth daily with breakfast. 30 capsule 3  . VITAMIN D PO Take 12,000 Units by mouth daily.     No current facility-administered medications for this visit.   Facility-Administered Medications Ordered in Other Visits  Medication Dose Route Frequency Provider Last Rate Last Admin  . sodium chloride flush (NS) 0.9 % injection 10 mL  10 mL Intracatheter PRN Truitt Merle, MD   10 mL at 11/02/19 1336     PHYSICAL EXAMINATION: ECOG PERFORMANCE STATUS: 0 - Asymptomatic  Vitals:   11/02/19 1049  BP: (!) 145/89  Pulse: 71  Resp: 18  Temp: 97.7 F (36.5 C)  SpO2: 98%   Filed Weights   11/02/19 1049  Weight: 173 lb 6.4 oz (78.7 kg)    Due to COVID19 we will limit examination to appearance. Patient had no complaints.  GENERAL:alert, no distress and comfortable SKIN: skin color normal, no rashes or significant lesions EYES: normal, Conjunctiva are pink and non-injected, sclera clear  NEURO: alert & oriented x 3 with fluent speech    LABORATORY DATA:  I have reviewed the data as listed CBC Latest Ref Rng & Units 11/02/2019 10/12/2019 08/31/2019  WBC 4.0 - 10.5 K/uL 4.1 3.7(L) 3.9(L)  Hemoglobin 12.0 - 15.0 g/dL 13.0 12.8 14.0  Hematocrit 36 - 46 % 37.9 38.0 41.4  Platelets 150 - 400 K/uL 163 149(L) 166     CMP Latest Ref Rng & Units 11/02/2019 10/12/2019 08/31/2019  Glucose 70 - 99 mg/dL 88 114(H) 101(H)  BUN 8 - 23 mg/dL $Remove'16 14 18  'aJsqmPt$ Creatinine 0.44 - 1.00 mg/dL 0.77 0.71 0.82  Sodium 135 - 145 mmol/L 140 139 140  Potassium 3.5 - 5.1 mmol/L 4.0 4.3 4.1  Chloride 98 - 111 mmol/L 106 106 105  CO2 22 - 32 mmol/L $RemoveB'30 30 27  'cRUQlLlC$ Calcium 8.9 - 10.3 mg/dL 9.0 9.1 10.0  Total Protein 6.5 - 8.1 g/dL 6.2(L) 6.2(L) 6.6  Total Bilirubin 0.3 - 1.2 mg/dL 0.3 0.4 0.3  Alkaline Phos 38 - 126 U/L 73 81 81  AST 15 - 41 U/L $Remo'22 21 29  'ShviA$ ALT 0 - 44 U/L 34 37 42      RADIOGRAPHIC STUDIES: I have personally reviewed the radiological images as listed and agreed with the findings in the report. No results found.   ASSESSMENT & PLAN:  Angeliyah Kirkey is a 59 y.o. female with    1.Malignant neoplasm of upper-outer quadrant of right breast,invasive ductal carcinoma,pT1cN0M0,stageIA,Triple Positive,Grade3 -She was recently diagnosed in 12/2018.She has 42mm mass of right breast with invasive ductal carcinoma and components of DCIS.She underwent right lumpectomy and adjuvant radiation.   -She completed adjuvant chemotherapy with Weekly Abraxane and trastuzumab-anns (Kanjinti) 02/23/19-05/11/19. She started maintenance Kanjinti to complete 1 year of treatment on 01/25/19. Her insurance denied the injections.  -She started antiestrogen therapy with Letrozole in 08/2019. She is tolerating well  -She continues to tolerate treatment well. Labs reviewed and adequate to proceed with Kanjinti today.  -Continue Kanjinti every 3 weeks and conitnue Letrozole once daily.  -F/u in 12 weeks with last treatment    2. Hot Flashes  -Secondary to Letrozole  -On Effexor 37.$RemoveBefore'5mg'eRWSBNwUZTqtX$ . Given tolerance she can increase to $RemoveBef'75mg'iIPLxslEev$  to better control hot flashes (11/02/19)   3. Osteopenia  -Her 07/2019 DEXA shows osteopenia with lowest T-score -1.3 at right hip.  -I discussed Letrozole can decrease her bone density. Will repeat DEXA in 07/2021.  4. Estrogen replacement andpostmenopausal vaginal atrophy  -Started after 2nd Mirena was taken out. Her Gyn put her on a third Mirena and bleeding stopped.  Vernia Buff estrogen replacement for several years for hot flashes. By her GYN. -Without hormonal replacement and Mirena she has vignal atrophy and pain with intercourse.  -I encouraged her to use lubricant with intercourse.  I also discussed option of Monalisa procedure and encourage her to contact her Gyn. I discussed the other option of switching to Tamoxifen which would allow her to use low dose estrogen vaginal creams. She will think about it.    5. Genetics: Pt notes she had genetic testing before at [facility]. It was negative    PLAN: -Continue Letrozole  -Labs reviewed and adequate to proceed with Kanjinti today  -Kanjinti in 3, 6, 9, 12 weeks  -Lab in 9 weeks  -F/u in 12 weeks (last infusion) -Mammogram in 12/2019   No problem-specific Assessment & Plan notes found for this encounter.   Orders Placed This Encounter  Procedures  . MM DIAG BREAST TOMO BILATERAL    Standing  Status:   Future    Standing Expiration Date:   11/01/2020    Order Specific Question:   Reason for Exam (SYMPTOM  OR DIAGNOSIS REQUIRED)    Answer:   screening    Order Specific Question:   Preferred imaging location?    Answer:   [facility]   All questions were answered. The patient knows to call the clinic with any problems, questions or concerns. No barriers to learning was detected. The total time spent in the appointment was 30 minutes.     Truitt Merle, MD 11/02/2019   I, Joslyn Devon, am acting as scribe for Truitt Merle, MD.   I have reviewed the above documentation for accuracy and completeness, and I agree with the above.

## 2019-11-02 ENCOUNTER — Encounter: Payer: Self-pay | Admitting: Hematology

## 2019-11-02 ENCOUNTER — Other Ambulatory Visit: Payer: Self-pay

## 2019-11-02 ENCOUNTER — Inpatient Hospital Stay: Payer: 59 | Attending: Hematology

## 2019-11-02 ENCOUNTER — Inpatient Hospital Stay: Payer: 59

## 2019-11-02 ENCOUNTER — Inpatient Hospital Stay (HOSPITAL_BASED_OUTPATIENT_CLINIC_OR_DEPARTMENT_OTHER): Payer: 59 | Admitting: Hematology

## 2019-11-02 ENCOUNTER — Encounter: Payer: Self-pay | Admitting: *Deleted

## 2019-11-02 VITALS — BP 145/89 | HR 71 | Temp 97.7°F | Resp 18 | Ht 63.0 in | Wt 173.4 lb

## 2019-11-02 DIAGNOSIS — Z5112 Encounter for antineoplastic immunotherapy: Secondary | ICD-10-CM | POA: Insufficient documentation

## 2019-11-02 DIAGNOSIS — Z170 Estrogen receptor positive status [ER+]: Secondary | ICD-10-CM

## 2019-11-02 DIAGNOSIS — C50411 Malignant neoplasm of upper-outer quadrant of right female breast: Secondary | ICD-10-CM

## 2019-11-02 LAB — CMP (CANCER CENTER ONLY)
ALT: 34 U/L (ref 0–44)
AST: 22 U/L (ref 15–41)
Albumin: 3.4 g/dL — ABNORMAL LOW (ref 3.5–5.0)
Alkaline Phosphatase: 73 U/L (ref 38–126)
Anion gap: 4 — ABNORMAL LOW (ref 5–15)
BUN: 16 mg/dL (ref 8–23)
CO2: 30 mmol/L (ref 22–32)
Calcium: 9 mg/dL (ref 8.9–10.3)
Chloride: 106 mmol/L (ref 98–111)
Creatinine: 0.77 mg/dL (ref 0.44–1.00)
GFR, Estimated: >60 mL/min (ref >60)
Glucose, Bld: 88 mg/dL (ref 70–99)
Potassium: 4 mmol/L (ref 3.5–5.1)
Sodium: 140 mmol/L (ref 135–145)
Total Bilirubin: 0.3 mg/dL (ref 0.3–1.2)
Total Protein: 6.2 g/dL — ABNORMAL LOW (ref 6.5–8.1)

## 2019-11-02 LAB — CBC WITH DIFFERENTIAL (CANCER CENTER ONLY)
Abs Immature Granulocytes: 0.01 10*3/uL (ref 0.00–0.07)
Basophils Absolute: 0 10*3/uL (ref 0.0–0.1)
Basophils Relative: 1 %
Eosinophils Absolute: 0.2 10*3/uL (ref 0.0–0.5)
Eosinophils Relative: 4 %
HCT: 37.9 % (ref 36.0–46.0)
Hemoglobin: 13 g/dL (ref 12.0–15.0)
Immature Granulocytes: 0 %
Lymphocytes Relative: 23 %
Lymphs Abs: 0.9 10*3/uL (ref 0.7–4.0)
MCH: 30.3 pg (ref 26.0–34.0)
MCHC: 34.3 g/dL (ref 30.0–36.0)
MCV: 88.3 fL (ref 80.0–100.0)
Monocytes Absolute: 0.5 10*3/uL (ref 0.1–1.0)
Monocytes Relative: 13 %
Neutro Abs: 2.5 10*3/uL (ref 1.7–7.7)
Neutrophils Relative %: 59 %
Platelet Count: 163 10*3/uL (ref 150–400)
RBC: 4.29 MIL/uL (ref 3.87–5.11)
RDW: 12.8 % (ref 11.5–15.5)
WBC Count: 4.1 10*3/uL (ref 4.0–10.5)
nRBC: 0 % (ref 0.0–0.2)

## 2019-11-02 MED ORDER — ACETAMINOPHEN 325 MG PO TABS
ORAL_TABLET | ORAL | Status: AC
  Filled 2019-11-02: qty 2

## 2019-11-02 MED ORDER — SODIUM CHLORIDE 0.9% FLUSH
10.0000 mL | INTRAVENOUS | Status: DC | PRN
  Administered 2019-11-02: 10 mL
  Filled 2019-11-02: qty 10

## 2019-11-02 MED ORDER — HEPARIN SOD (PORK) LOCK FLUSH 100 UNIT/ML IV SOLN
500.0000 [IU] | Freq: Once | INTRAVENOUS | Status: AC | PRN
  Administered 2019-11-02: 500 [IU]
  Filled 2019-11-02: qty 5

## 2019-11-02 MED ORDER — TRASTUZUMAB-ANNS CHEMO 150 MG IV SOLR
450.0000 mg | Freq: Once | INTRAVENOUS | Status: AC
  Administered 2019-11-02: 450 mg via INTRAVENOUS
  Filled 2019-11-02: qty 21.43

## 2019-11-02 MED ORDER — DIPHENHYDRAMINE HCL 25 MG PO CAPS
ORAL_CAPSULE | ORAL | Status: AC
  Filled 2019-11-02: qty 1

## 2019-11-02 MED ORDER — ACETAMINOPHEN 325 MG PO TABS
650.0000 mg | ORAL_TABLET | Freq: Once | ORAL | Status: AC
  Administered 2019-11-02: 650 mg via ORAL

## 2019-11-02 MED ORDER — SODIUM CHLORIDE 0.9 % IV SOLN
Freq: Once | INTRAVENOUS | Status: AC
  Filled 2019-11-02: qty 250

## 2019-11-02 MED ORDER — DIPHENHYDRAMINE HCL 25 MG PO CAPS
25.0000 mg | ORAL_CAPSULE | Freq: Once | ORAL | Status: AC
  Administered 2019-11-02: 25 mg via ORAL

## 2019-11-02 NOTE — Patient Instructions (Signed)
[facility] °Discharge Instructions for Patients Receiving Chemotherapy ° °Today you received the following chemotherapy agents Trastuzumab ° °To help prevent nausea and vomiting after your treatment, we encourage you to take your nausea medication as directed. °  °If you develop nausea and vomiting that is not controlled by your nausea medication, call the clinic.  ° °BELOW ARE SYMPTOMS THAT SHOULD BE REPORTED IMMEDIATELY: °· *FEVER GREATER THAN 100.5 F °· *CHILLS WITH OR WITHOUT FEVER °· NAUSEA AND VOMITING THAT IS NOT CONTROLLED WITH YOUR NAUSEA MEDICATION °· *UNUSUAL SHORTNESS OF BREATH °· *UNUSUAL BRUISING OR BLEEDING °· TENDERNESS IN MOUTH AND THROAT WITH OR WITHOUT PRESENCE OF ULCERS °· *URINARY PROBLEMS °· *BOWEL PROBLEMS °· UNUSUAL RASH °Items with * indicate a potential emergency and should be followed up as soon as possible. ° °Feel free to call the clinic should you have any questions or concerns. The clinic phone number is [phone]. ° °Please show the CHEMO ALERT CARD at check-in to the Emergency Department and triage nurse. ° ° °

## 2019-11-03 ENCOUNTER — Telehealth: Payer: Self-pay | Admitting: Hematology

## 2019-11-03 NOTE — Telephone Encounter (Signed)
Scheduled per 10/13 los. Pt is aware of appt times and dates.

## 2019-11-15 ENCOUNTER — Other Ambulatory Visit: Payer: Self-pay

## 2019-11-15 MED ORDER — VENLAFAXINE HCL ER 37.5 MG PO CP24
75.0000 mg | ORAL_CAPSULE | Freq: Every day | ORAL | 3 refills | Status: DC
Start: 2019-11-15 — End: 2019-12-13

## 2019-11-16 NOTE — Progress Notes (Signed)
CARDIO-ONCOLOGY CLINIC NOTE  Referring Physician: Dr. 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 Burr Medico for enrollment into the Cardio-Oncology program.  SUMMARY OF ONCOLOGIC HISTORY:     Oncology History Overview Note   Cancer Staging Malignant neoplasm of upper-outer quadrant of right breast in female, estrogen receptor positive ([name]) Staging form: Breast, AJCC 8th Edition - Clinical stage from 12/29/2018: Stage IA (cT1b, cN0, cM0, G2, ER+, PR+, HER2+) - Unsigned - Pathologic stage from 01/27/2019: Stage IA (pT1c, pN0, cM0, G3, ER+, PR+, HER2+) - Signed by Truitt Merle, MD on 02/06/2019 Cancer Staging No matching staging information was found for the patient.    Malignant neoplasm of upper-outer quadrant of right breast in female, estrogen receptor positive ([place])   12/22/2018 Mammogram     Diagnostic Mammogram 12/22/18  IMPRESSION: 1. 9 mm mass, with associated calcifications and architectural distortion, in the 11 o'clock position of the right breast, highly suspicious for breast carcinoma. 2. Questionable mass noted in the inferior aspect of the right breast remains equivocal on diagnostic imaging may reflect focal fibroglandular tissue. There is no sonographic correlate.    12/23/2018 Initial Biopsy    Diagnosis 12/23/18 Breast, right, needle core biopsy, 11 o'clock position - INVASIVE DUCTAL CARCINOMA, SEE COMMENT. - DUCTAL CARCINOMA IN SITU WITH NECROSIS.    12/28/2018 Initial Diagnosis    Malignant neoplasm of upper-outer quadrant of right breast in female, estrogen receptor positive ([name])    01/13/2019 Imaging    MRI Breast 01/13/19  IMPRESSION: 1. Biopsy proven malignancy in the upper-outer right breast. No additional suspicious findings on the right. 2. No MRI evidence of malignancy on the left. 3. No suspicious lymphadenopathy.    01/27/2019 Surgery    RIGHT BREAST  LUMPECTOMY WITH RADIOACTIVE SEED AND SENTINEL LYMPH NODE MAPPING and PAC placement by Dr Brantley Stage  01/27/19    01/27/2019 Pathology Results    FINAL MICROSCOPIC DIAGNOSIS: 01/27/19  A. BREAST, RIGHT, LUMPECTOMY:  - Invasive ductal carcinoma, grade 3, spanning 1.3 cm.  - High grade ductal carcinoma in situ with necrosis.  - Biopsy site.  - Final resection margins are negative for carcinoma.  - See oncology table.   B. BREAST, RIGHT ADDITIONAL SUPERIOR MARGIN, EXCISION:  - Fibrocystic change and adenosis.  - No malignancy identified.   C. LYMPH NODE, RIGHT #1, SENTINEL, BIOPSY:  - One of one lymph nodes negative for carcinoma (0/1).   D. LYMPH NODE, RIGHT, SENTINEL, BIOPSY:  - One of one lymph nodes negative for carcinoma (0/1).   E. LYMPH NODE, RIGHT, SENTINEL, BIOPSY:  - One of one lymph nodes negative for carcinoma (0/1).   F. LYMPH NODE, RIGHT, SENTINEL, BIOPSY:  - One of one lymph nodes negative for carcinoma (0/1).        01/27/2019 Cancer Staging    Staging form: Breast, AJCC 8th Edition - Pathologic stage from 01/27/2019: Stage IA (pT1c, pN0, cM0, G3, ER+, PR+, HER2+) - Signed by Truitt Merle, MD on 02/06/2019    02/08/2019 Imaging    CT AP W Contrast 02/08/19  IMPRESSION: 1. Wall thickening of the gastric antrum may represent gastritis.  Aortic Atherosclerosis (ICD10-I70.0).    02/23/2019 -  Chemotherapy    Adjuvant weekly Abraxane with Herceptin q3weeks starting2/3/21      Here for routine f/u. Continues with Herceptin q3 weeks. Tolerating well. Exercising on TM without CP or SOB. LE edema improved.  Echo today EF 60-65% RV normal. Personally reviewed   Echo: 12/20: EF 55-60% grade I DD Echo 04/13/19: EF 60-65%  Echo 07/15/19 EF 60-65%     Past Medical History:  Diagnosis Date  . Cancer [facility])    breast cancer  . Gastric ulcer   . Hyperlipidemia     Current Outpatient Medications  Medication Sig Dispense Refill  . CALCIUM PO Take 1,000 mg by  mouth daily.    Marland Kitchen letrozole (FEMARA) 2.5 MG tablet Take 1 tablet (2.5 mg total) by mouth daily. 90 tablet 3  . Magnesium 125 MG CAPS Take 1 capsule by mouth daily.     . Multiple Vitamins-Minerals (MULTIVITAMIN WITH MINERALS) tablet Take 1 tablet by mouth daily.    . traZODone (DESYREL) 50 MG tablet Take 1 tablet (50 mg total) by mouth at bedtime as needed for sleep. 90 tablet 1  . venlafaxine XR (EFFEXOR-XR) 37.5 MG 24 hr capsule Take 2 capsules (75 mg total) by mouth daily with breakfast. 60 capsule 3  . VITAMIN D PO Take 12,000 Units by mouth daily.    . Melatonin 3 MG CAPS Take 3 mg by mouth at bedtime.      No current facility-administered medications for this encounter.    No Known Allergies    Social History   Socioeconomic History  . Marital status: Married    Spouse name: Not on file  . Number of children: 2  . Years of education: Not on file  . Highest education level: Not on file  Occupational History  . Not on file  Tobacco Use  . Smoking status: Former Smoker    Packs/day: 0.25    Years: 5.00    Pack years: 1.25    Quit date: 01/20/1978    Years since quitting: 41.8  . Smokeless tobacco: Never Used  . Tobacco comment: 1982  Vaping Use  . Vaping Use: Never used  Substance and Sexual Activity  . Alcohol use: Yes    Alcohol/week: 6.0 standard drinks    Types: 6 Glasses of wine per week    Comment: occasional  . Drug use: No  . Sexual activity: Yes    Birth control/protection: None  Other Topics Concern  . Not on file  Social History Narrative  . Not on file   Social Determinants of Health   Financial Resource Strain:   . Difficulty of Paying Living Expenses: Not on file  Food Insecurity:   . Worried About Charity fundraiser in the Last Year: Not on file  . Ran Out of Food in the Last Year: Not on file  Transportation Needs:   . Lack of Transportation (Medical): Not on file  . Lack of Transportation (Non-Medical): Not on file  Physical Activity:   .  Days of Exercise per Week: Not on file  . Minutes of Exercise per Session: Not on file  Stress:   . Feeling of Stress : Not on file  Social Connections:   . Frequency of Communication with Friends and Family: Not on file  . Frequency of Social Gatherings with Friends and Family: Not on file  . Attends Religious Services: Not on file  . Active Member of Clubs or Organizations: Not on file  . Attends Archivist Meetings: Not on file  . Marital Status: Not on file  Intimate Partner Violence:   . Fear of Current or Ex-Partner: Not on file  . Emotionally Abused: Not on file  . Physically Abused:  Not on file  . Sexually Abused: Not on file      Family History  Problem Relation Age of Onset  . Diabetes Mother   . Heart disease Mother   . Hyperlipidemia Mother   . Breast cancer Mother 69  . Cancer Mother        breast DCIS  . Cancer Father 76       prostate cancer   . Diabetes Father   . Breast cancer Sister 49  . Cancer Sister 31       breast cancer   . Cancer Maternal Aunt 55       breast cancer   . Cancer Maternal Grandfather        colon cancer  . Colon cancer Maternal Grandfather   . Esophageal cancer Neg Hx   . Rectal cancer Neg Hx   . Stomach cancer Neg Hx     Vitals:   11/17/19 1357  BP: 138/86  Pulse: 85  SpO2: 98%  Weight: 78.5 kg (173 lb)    PHYSICAL EXAM: General:  Well appearing. No resp difficulty HEENT: normal Neck: supple. no JVD. Carotids 2+ bilat; no bruits. No lymphadenopathy or thryomegaly appreciated. Cor: PMI nondisplaced. Regular rate & rhythm. No rubs, gallops or murmurs. R pot-a-cath Lungs: clear Abdomen: soft, nontender, nondistended. No hepatosplenomegaly. No bruits or masses. Good bowel sounds. Extremities: no cyanosis, clubbing, rash, edema Neuro: alert & orientedx3, cranial nerves grossly intact. moves all 4 extremities w/o difficulty. Affect pleasant    ASSESSMENT & PLAN:  1. Right Breast Cancer - diagnosed 12/20 -  s/p lumpectomy - echo today 11/17/19 EF 60-65% Personally reviewed - I reviewed echos personally. EF and Doppler parameters stable. No HF on exam. Continue Herceptin (finishes 1/22) .  - see back in 3 months with echo  2. Mild LE edema - much improved  Glori Bickers, MD  2:01 PM

## 2019-11-17 ENCOUNTER — Ambulatory Visit (HOSPITAL_COMMUNITY)
Admission: RE | Admit: 2019-11-17 | Discharge: 2019-11-17 | Disposition: A | Discharge status: Home / Self Care | Payer: 59 | Source: Ambulatory Visit | Attending: Internal Medicine | Admitting: Internal Medicine

## 2019-11-17 ENCOUNTER — Other Ambulatory Visit: Payer: Self-pay

## 2019-11-17 ENCOUNTER — Encounter (HOSPITAL_COMMUNITY): Payer: Self-pay | Admitting: Internal Medicine

## 2019-11-17 ENCOUNTER — Ambulatory Visit (HOSPITAL_BASED_OUTPATIENT_CLINIC_OR_DEPARTMENT_OTHER)
Admission: RE | Admit: 2019-11-17 | Discharge: 2019-11-17 | Disposition: A | Discharge status: Home / Self Care | Payer: 59 | Source: Ambulatory Visit | Attending: Internal Medicine | Admitting: Internal Medicine

## 2019-11-17 VITALS — BP 138/86 | HR 85 | Wt 173.0 lb

## 2019-11-17 DIAGNOSIS — Z79899 Other long term (current) drug therapy: Secondary | ICD-10-CM | POA: Diagnosis not present

## 2019-11-17 DIAGNOSIS — C50411 Malignant neoplasm of upper-outer quadrant of right female breast: Secondary | ICD-10-CM | POA: Insufficient documentation

## 2019-11-17 DIAGNOSIS — R600 Localized edema: Secondary | ICD-10-CM | POA: Insufficient documentation

## 2019-11-17 DIAGNOSIS — Z170 Estrogen receptor positive status [ER+]: Secondary | ICD-10-CM

## 2019-11-17 DIAGNOSIS — Z803 Family history of malignant neoplasm of breast: Secondary | ICD-10-CM | POA: Diagnosis not present

## 2019-11-17 DIAGNOSIS — Z87891 Personal history of nicotine dependence: Secondary | ICD-10-CM | POA: Insufficient documentation

## 2019-11-17 DIAGNOSIS — E785 Hyperlipidemia, unspecified: Secondary | ICD-10-CM | POA: Insufficient documentation

## 2019-11-17 DIAGNOSIS — Z0189 Encounter for other specified special examinations: Secondary | ICD-10-CM | POA: Diagnosis not present

## 2019-11-17 DIAGNOSIS — Z01818 Encounter for other preprocedural examination: Secondary | ICD-10-CM | POA: Diagnosis present

## 2019-11-17 DIAGNOSIS — Z9882 Breast implant status: Secondary | ICD-10-CM | POA: Insufficient documentation

## 2019-11-17 DIAGNOSIS — Z853 Personal history of malignant neoplasm of breast: Secondary | ICD-10-CM | POA: Diagnosis not present

## 2019-11-17 LAB — ECHOCARDIOGRAM COMPLETE
Area-P 1/2: 3.72 cm2
S' Lateral: 2.7 cm

## 2019-11-17 NOTE — Patient Instructions (Signed)
Your physician recommends that you schedule a follow-up appointment in: 3 months with echocardiogram  If you have any questions or concerns before your next appointment please send [place] a message through [place] or call our office at [phone].    TO LEAVE A MESSAGE FOR THE NURSE SELECT OPTION 2, PLEASE LEAVE A MESSAGE INCLUDING: . YOUR NAME . DATE OF BIRTH . CALL BACK NUMBER . REASON FOR CALL**this is important as we prioritize the call backs  [place] AS LONG AS YOU CALL BEFORE 4:00 PM  At the [facility], you and your health needs are our priority. As part of our continuing mission to provide you with exceptional heart care, we have created designated Provider Care Teams. These Care Teams include your primary Cardiologist (physician) and Advanced Practice Providers (APPs- Physician Assistants and Nurse Practitioners) who all work together to provide you with the care you need, when you need it.   You may see any of the following providers on your designated Care Team at your next follow up: Marland Kitchen Dr Glori Bickers . Dr Loralie Champagne . Darrick Grinder, NP . Lyda Jester, PA . Audry Riles, PharmD   Please be sure to bring in all your medications bottles to every appointment.

## 2019-11-17 NOTE — Progress Notes (Signed)
  Echocardiogram 2D Echocardiogram has been performed.  Alicia Hahn 11/17/2019, 1:30 PM

## 2019-11-23 ENCOUNTER — Ambulatory Visit: Payer: 59

## 2019-11-23 ENCOUNTER — Other Ambulatory Visit: Payer: 59

## 2019-11-25 ENCOUNTER — Other Ambulatory Visit: Payer: Self-pay

## 2019-11-25 ENCOUNTER — Inpatient Hospital Stay: Payer: 59 | Attending: Hematology

## 2019-11-25 VITALS — BP 141/75 | HR 96 | Temp 98.1°F | Resp 17 | Wt 172.0 lb

## 2019-11-25 DIAGNOSIS — Z5112 Encounter for antineoplastic immunotherapy: Secondary | ICD-10-CM | POA: Insufficient documentation

## 2019-11-25 DIAGNOSIS — Z170 Estrogen receptor positive status [ER+]: Secondary | ICD-10-CM

## 2019-11-25 DIAGNOSIS — Z79899 Other long term (current) drug therapy: Secondary | ICD-10-CM | POA: Insufficient documentation

## 2019-11-25 DIAGNOSIS — C50411 Malignant neoplasm of upper-outer quadrant of right female breast: Secondary | ICD-10-CM | POA: Insufficient documentation

## 2019-11-25 MED ORDER — TRASTUZUMAB-ANNS CHEMO 150 MG IV SOLR
450.0000 mg | Freq: Once | INTRAVENOUS | Status: AC
  Administered 2019-11-25: 450 mg via INTRAVENOUS
  Filled 2019-11-25: qty 21.43

## 2019-11-25 MED ORDER — ACETAMINOPHEN 325 MG PO TABS
650.0000 mg | ORAL_TABLET | Freq: Once | ORAL | Status: AC
  Administered 2019-11-25: 650 mg via ORAL

## 2019-11-25 MED ORDER — DIPHENHYDRAMINE HCL 25 MG PO CAPS
25.0000 mg | ORAL_CAPSULE | Freq: Once | ORAL | Status: AC
  Administered 2019-11-25: 25 mg via ORAL

## 2019-11-25 MED ORDER — ACETAMINOPHEN 325 MG PO TABS
ORAL_TABLET | ORAL | Status: AC
  Filled 2019-11-25: qty 2

## 2019-11-25 MED ORDER — HEPARIN SOD (PORK) LOCK FLUSH 100 UNIT/ML IV SOLN
500.0000 [IU] | Freq: Once | INTRAVENOUS | Status: AC | PRN
  Administered 2019-11-25: 500 [IU]
  Filled 2019-11-25: qty 5

## 2019-11-25 MED ORDER — DIPHENHYDRAMINE HCL 25 MG PO CAPS
ORAL_CAPSULE | ORAL | Status: AC
  Filled 2019-11-25: qty 1

## 2019-11-25 MED ORDER — SODIUM CHLORIDE 0.9% FLUSH
10.0000 mL | INTRAVENOUS | Status: DC | PRN
  Administered 2019-11-25: 10 mL
  Filled 2019-11-25: qty 10

## 2019-11-25 MED ORDER — SODIUM CHLORIDE 0.9 % IV SOLN
Freq: Once | INTRAVENOUS | Status: AC
  Filled 2019-11-25: qty 250

## 2019-11-25 NOTE — Patient Instructions (Signed)
[facility] °Discharge Instructions for Patients Receiving Chemotherapy ° °Today you received the following chemotherapy agents Trastuzumab ° °To help prevent nausea and vomiting after your treatment, we encourage you to take your nausea medication as directed. °  °If you develop nausea and vomiting that is not controlled by your nausea medication, call the clinic.  ° °BELOW ARE SYMPTOMS THAT SHOULD BE REPORTED IMMEDIATELY: °· *FEVER GREATER THAN 100.5 F °· *CHILLS WITH OR WITHOUT FEVER °· NAUSEA AND VOMITING THAT IS NOT CONTROLLED WITH YOUR NAUSEA MEDICATION °· *UNUSUAL SHORTNESS OF BREATH °· *UNUSUAL BRUISING OR BLEEDING °· TENDERNESS IN MOUTH AND THROAT WITH OR WITHOUT PRESENCE OF ULCERS °· *URINARY PROBLEMS °· *BOWEL PROBLEMS °· UNUSUAL RASH °Items with * indicate a potential emergency and should be followed up as soon as possible. ° °Feel free to call the clinic should you have any questions or concerns. The clinic phone number is [phone]. ° °Please show the CHEMO ALERT CARD at check-in to the Emergency Department and triage nurse. ° ° °

## 2019-11-28 ENCOUNTER — Ambulatory Visit (INDEPENDENT_AMBULATORY_CARE_PROVIDER_SITE_OTHER): Payer: 59 | Admitting: Podiatry

## 2019-11-28 ENCOUNTER — Encounter: Payer: Self-pay | Admitting: Podiatry

## 2019-11-28 ENCOUNTER — Other Ambulatory Visit: Payer: Self-pay

## 2019-11-28 DIAGNOSIS — L600 Ingrowing nail: Secondary | ICD-10-CM | POA: Diagnosis not present

## 2019-11-28 MED ORDER — NEOMYCIN-POLYMYXIN-HC 1 % OT SOLN: OTIC | 1 refills | Status: DC

## 2019-11-28 NOTE — Patient Instructions (Signed)

## 2019-11-28 NOTE — Progress Notes (Signed)
Subjective:  Patient ID: Alicia Hahn, female    DOB: 1960/10/31,  MRN: [id number] HPI Chief Complaint  Patient presents with  . Toe Pain    Hallux left - medial border, tender, swollen and red x few weeks, pedicurist cut out but come back, wonders if "over pronating" causing the ingrown, sometimes arch pain left  . New Patient (Initial Visit)    59 y.o. female presents with the above complaint.   ROS: Denies fever chills nausea vomiting muscle aches pains calf pain back pain chest pain shortness of breath.  Past Medical History:  Diagnosis Date  . Cancer [facility])    breast cancer  . Gastric ulcer   . Hyperlipidemia    Past Surgical History:  Procedure Laterality Date  . BREAST LUMPECTOMY WITH RADIOACTIVE SEED AND SENTINEL LYMPH NODE BIOPSY Right 01/27/2019   Procedure: RIGHT BREAST LUMPECTOMY WITH RADIOACTIVE SEED AND SENTINEL LYMPH NODE MAPPING;  Surgeon: Erroll Luna, MD;  Location: [place];  Service: General;  Laterality: Right;  . COSMETIC SURGERY Bilateral 2000   breast aug/ lipo  . PORTACATH PLACEMENT Right 01/27/2019   Procedure: INSERTION PORT-A-CATH WITH ULTRASOUND;  Surgeon: Erroll Luna, MD;  Location: [place];  Service: General;  Laterality: Right;    Current Outpatient Medications:  .  atorvastatin (LIPITOR) 40 MG tablet, atorvastatin 40 mg tablet, Disp: , Rfl:  .  CALCIUM PO, Take 1,000 mg by mouth daily., Disp: , Rfl:  .  ibuprofen (IBU) 800 MG tablet, IBU 800 mg tablet, Disp: , Rfl:  .  letrozole (FEMARA) 2.5 MG tablet, Take 1 tablet (2.5 mg total) by mouth daily., Disp: 90 tablet, Rfl: 3 .  Magnesium 125 MG CAPS, Take 1 capsule by mouth daily. , Disp: , Rfl:  .  Melatonin 3 MG CAPS, Take 3 mg by mouth at bedtime. , Disp: , Rfl:  .  Multiple Vitamins-Minerals (MULTIVITAMIN WITH MINERALS) tablet, Take 1 tablet by mouth daily., Disp: , Rfl:  .  NEOMYCIN-POLYMYXIN-HYDROCORTISONE (CORTISPORIN) 1 % SOLN OTIC solution, Apply  1-2 drops to toe BID after soaking, Disp: 10 mL, Rfl: 1 .  pantoprazole (PROTONIX) 40 MG tablet, pantoprazole 40 mg tablet,delayed release  TAKE 1 TABLET BY MOUTH DAILY., Disp: , Rfl:  .  traZODone (DESYREL) 50 MG tablet, Take 1 tablet (50 mg total) by mouth at bedtime as needed for sleep., Disp: 90 tablet, Rfl: 1 .  venlafaxine XR (EFFEXOR-XR) 37.5 MG 24 hr capsule, Take 2 capsules (75 mg total) by mouth daily with breakfast., Disp: 60 capsule, Rfl: 3 .  VITAMIN D PO, Take 12,000 Units by mouth daily., Disp: , Rfl:   No Known Allergies Review of Systems Objective:  There were no vitals filed for this visit.  General: Well developed, nourished, in no acute distress, alert and oriented x3   Dermatological: Skin is warm, dry and supple bilateral. Nails x 10 are well maintained; remaining integument appears unremarkable at this time. There are no open sores, no preulcerative lesions, no rash or signs of infection present.  Vascular: Dorsalis Pedis artery and Posterior Tibial artery pedal pulses are 2/4 bilateral with immedate capillary fill time. Pedal hair growth present. No varicosities and no lower extremity edema present bilateral.   Neruologic: Grossly intact via light touch bilateral. Vibratory intact via tuning fork bilateral. Protective threshold with Semmes Wienstein monofilament intact to all pedal sites bilateral. Patellar and Achilles deep tendon reflexes 2+ bilateral. No Babinski or clonus noted bilateral.   Musculoskeletal: No gross  boney pedal deformities bilateral. No pain, crepitus, or limitation noted with foot and ankle range of motion bilateral. Muscular strength 5/5 in all groups tested bilateral.  Gait: Unassisted, Nonantalgic.    Radiographs:  None taken  Assessment & Plan:   Assessment: Ingrown nail paronychia tibial border hallux left  Plan: Chemical matrixectomy was performed today tibial border hallux left after local anesthetic was administered.  Tolerated  procedure well without complications.  She was provided with both oral and written home-going instructions for care and soaking of the toe as well as a prescription for Cortisporin Otic to be applied twice daily after soaking.  Follow-up with her in 2 months     Hristopher Missildine T. Phillips, [place]

## 2019-12-12 ENCOUNTER — Other Ambulatory Visit: Payer: Self-pay

## 2019-12-12 ENCOUNTER — Ambulatory Visit (INDEPENDENT_AMBULATORY_CARE_PROVIDER_SITE_OTHER): Payer: 59 | Admitting: Podiatry

## 2019-12-12 DIAGNOSIS — L600 Ingrowing nail: Secondary | ICD-10-CM

## 2019-12-12 NOTE — Progress Notes (Signed)
She presents today for follow-up of her matrixectomy hallux left.  She denies fever chills nausea vomiting states that she continues to soak at least once daily and apply Cortisporin otic directed.  Objective: Vital signs are stable alert oriented x3 there is no erythema edema cellulitis drainage or odor.  Appears to be healing very nicely and see no signs of infection.  No purulence no malodor.  Assessment: Tibial border hallux left healing very nicely.  Plan: Continue to soak at least once every other day until no longer tender on palpation.  Cover during the day leave open at bedtime follow-up with me as needed

## 2019-12-13 ENCOUNTER — Encounter: Payer: Self-pay | Admitting: *Deleted

## 2019-12-13 ENCOUNTER — Other Ambulatory Visit: Payer: Self-pay

## 2019-12-13 DIAGNOSIS — T451X5A Adverse effect of antineoplastic and immunosuppressive drugs, initial encounter: Secondary | ICD-10-CM

## 2019-12-13 DIAGNOSIS — C50411 Malignant neoplasm of upper-outer quadrant of right female breast: Secondary | ICD-10-CM

## 2019-12-13 DIAGNOSIS — R232 Flushing: Secondary | ICD-10-CM

## 2019-12-13 DIAGNOSIS — Z170 Estrogen receptor positive status [ER+]: Secondary | ICD-10-CM

## 2019-12-13 MED ORDER — VENLAFAXINE HCL ER 37.5 MG PO CP24: 75.0000 mg | ORAL_CAPSULE | Freq: Every day | ORAL | 6 refills | Status: DC

## 2019-12-14 ENCOUNTER — Ambulatory Visit: Payer: 59 | Admitting: Nurse Practitioner

## 2019-12-14 ENCOUNTER — Inpatient Hospital Stay: Payer: 59

## 2019-12-14 ENCOUNTER — Other Ambulatory Visit: Payer: 59

## 2019-12-14 ENCOUNTER — Other Ambulatory Visit: Payer: Self-pay

## 2019-12-14 VITALS — BP 142/87 | HR 95 | Temp 97.7°F | Resp 17 | Wt 172.0 lb

## 2019-12-14 DIAGNOSIS — C50411 Malignant neoplasm of upper-outer quadrant of right female breast: Secondary | ICD-10-CM

## 2019-12-14 DIAGNOSIS — Z5112 Encounter for antineoplastic immunotherapy: Secondary | ICD-10-CM | POA: Diagnosis not present

## 2019-12-14 DIAGNOSIS — Z170 Estrogen receptor positive status [ER+]: Secondary | ICD-10-CM

## 2019-12-14 MED ORDER — TRASTUZUMAB-ANNS CHEMO 150 MG IV SOLR
450.0000 mg | Freq: Once | INTRAVENOUS | Status: AC
  Administered 2019-12-14: 450 mg via INTRAVENOUS
  Filled 2019-12-14: qty 21.43

## 2019-12-14 MED ORDER — DIPHENHYDRAMINE HCL 25 MG PO CAPS
25.0000 mg | ORAL_CAPSULE | Freq: Once | ORAL | Status: AC
  Administered 2019-12-14: 25 mg via ORAL

## 2019-12-14 MED ORDER — SODIUM CHLORIDE 0.9 % IV SOLN
Freq: Once | INTRAVENOUS | Status: AC
  Filled 2019-12-14: qty 250

## 2019-12-14 MED ORDER — SODIUM CHLORIDE 0.9% FLUSH
10.0000 mL | INTRAVENOUS | Status: DC | PRN
  Administered 2019-12-14: 10 mL
  Filled 2019-12-14: qty 10

## 2019-12-14 MED ORDER — ACETAMINOPHEN 325 MG PO TABS
650.0000 mg | ORAL_TABLET | Freq: Once | ORAL | Status: AC
  Administered 2019-12-14: 650 mg via ORAL

## 2019-12-14 MED ORDER — DIPHENHYDRAMINE HCL 25 MG PO CAPS
ORAL_CAPSULE | ORAL | Status: AC
  Filled 2019-12-14: qty 1

## 2019-12-14 MED ORDER — ACETAMINOPHEN 325 MG PO TABS
ORAL_TABLET | ORAL | Status: AC
  Filled 2019-12-14: qty 2

## 2019-12-14 MED ORDER — HEPARIN SOD (PORK) LOCK FLUSH 100 UNIT/ML IV SOLN
500.0000 [IU] | Freq: Once | INTRAVENOUS | Status: AC | PRN
  Administered 2019-12-14: 500 [IU]
  Filled 2019-12-14: qty 5

## 2019-12-26 ENCOUNTER — Other Ambulatory Visit: Payer: Self-pay | Admitting: Hematology

## 2019-12-26 ENCOUNTER — Other Ambulatory Visit: Payer: Self-pay

## 2019-12-26 ENCOUNTER — Ambulatory Visit
Admission: RE | Admit: 2019-12-26 | Discharge: 2019-12-26 | Disposition: A | Discharge status: Home / Self Care | Payer: 59 | Source: Ambulatory Visit | Attending: Hematology | Admitting: Hematology

## 2019-12-26 ENCOUNTER — Other Ambulatory Visit: Payer: Self-pay | Admitting: *Deleted

## 2019-12-26 DIAGNOSIS — Z170 Estrogen receptor positive status [ER+]: Secondary | ICD-10-CM

## 2019-12-26 DIAGNOSIS — C50411 Malignant neoplasm of upper-outer quadrant of right female breast: Secondary | ICD-10-CM

## 2019-12-26 HISTORY — DX: Personal history of irradiation: Z92.3

## 2019-12-26 HISTORY — DX: Personal history of antineoplastic chemotherapy: Z92.21

## 2019-12-26 HISTORY — DX: Malignant neoplasm of unspecified site of unspecified female breast: C50.919

## 2019-12-26 MED ORDER — LETROZOLE 2.5 MG PO TABS
2.5000 mg | ORAL_TABLET | Freq: Every day | ORAL | 3 refills | Status: DC
Start: 2019-12-26 — End: 2020-11-15

## 2020-01-04 ENCOUNTER — Inpatient Hospital Stay: Payer: 59

## 2020-01-04 ENCOUNTER — Other Ambulatory Visit: Payer: Self-pay

## 2020-01-04 ENCOUNTER — Inpatient Hospital Stay: Payer: 59 | Attending: Hematology

## 2020-01-04 VITALS — BP 133/84 | HR 76 | Temp 97.9°F | Resp 19 | Ht 63.0 in | Wt 173.5 lb

## 2020-01-04 DIAGNOSIS — C50411 Malignant neoplasm of upper-outer quadrant of right female breast: Secondary | ICD-10-CM

## 2020-01-04 DIAGNOSIS — Z170 Estrogen receptor positive status [ER+]: Secondary | ICD-10-CM

## 2020-01-04 DIAGNOSIS — Z5112 Encounter for antineoplastic immunotherapy: Secondary | ICD-10-CM | POA: Insufficient documentation

## 2020-01-04 LAB — CBC WITH DIFFERENTIAL (CANCER CENTER ONLY)
Abs Immature Granulocytes: 0.01 10*3/uL (ref 0.00–0.07)
Basophils Absolute: 0.1 10*3/uL (ref 0.0–0.1)
Basophils Relative: 1 %
Eosinophils Absolute: 0.2 10*3/uL (ref 0.0–0.5)
Eosinophils Relative: 4 %
HCT: 40.9 % (ref 36.0–46.0)
Hemoglobin: 14 g/dL (ref 12.0–15.0)
Immature Granulocytes: 0 %
Lymphocytes Relative: 26 %
Lymphs Abs: 1.2 10*3/uL (ref 0.7–4.0)
MCH: 30.7 pg (ref 26.0–34.0)
MCHC: 34.2 g/dL (ref 30.0–36.0)
MCV: 89.7 fL (ref 80.0–100.0)
Monocytes Absolute: 0.6 10*3/uL (ref 0.1–1.0)
Monocytes Relative: 12 %
Neutro Abs: 2.6 10*3/uL (ref 1.7–7.7)
Neutrophils Relative %: 57 %
Platelet Count: 174 10*3/uL (ref 150–400)
RBC: 4.56 MIL/uL (ref 3.87–5.11)
RDW: 12 % (ref 11.5–15.5)
WBC Count: 4.5 10*3/uL (ref 4.0–10.5)
nRBC: 0 % (ref 0.0–0.2)

## 2020-01-04 LAB — CMP (CANCER CENTER ONLY)
ALT: 43 U/L (ref 0–44)
AST: 27 U/L (ref 15–41)
Albumin: 3.7 g/dL (ref 3.5–5.0)
Alkaline Phosphatase: 83 U/L (ref 38–126)
Anion gap: 0 — ABNORMAL LOW (ref 5–15)
BUN: 16 mg/dL (ref 8–23)
CO2: 36 mmol/L — ABNORMAL HIGH (ref 22–32)
Calcium: 10.2 mg/dL (ref 8.9–10.3)
Chloride: 107 mmol/L (ref 98–111)
Creatinine: 0.81 mg/dL (ref 0.44–1.00)
GFR, Estimated: >60 mL/min (ref >60)
Glucose, Bld: 117 mg/dL — ABNORMAL HIGH (ref 70–99)
Potassium: 4.3 mmol/L (ref 3.5–5.1)
Sodium: 140 mmol/L (ref 135–145)
Total Bilirubin: 0.3 mg/dL (ref 0.3–1.2)
Total Protein: 7 g/dL (ref 6.5–8.1)

## 2020-01-04 MED ORDER — TRASTUZUMAB-ANNS CHEMO 150 MG IV SOLR
450.0000 mg | Freq: Once | INTRAVENOUS | Status: AC
  Administered 2020-01-04: 450 mg via INTRAVENOUS
  Filled 2020-01-04: qty 21.43

## 2020-01-04 MED ORDER — DIPHENHYDRAMINE HCL 25 MG PO CAPS
25.0000 mg | ORAL_CAPSULE | Freq: Once | ORAL | Status: AC
  Administered 2020-01-04: 25 mg via ORAL

## 2020-01-04 MED ORDER — ACETAMINOPHEN 325 MG PO TABS
650.0000 mg | ORAL_TABLET | Freq: Once | ORAL | Status: AC
  Administered 2020-01-04: 650 mg via ORAL

## 2020-01-04 MED ORDER — DIPHENHYDRAMINE HCL 25 MG PO CAPS
ORAL_CAPSULE | ORAL | Status: AC
  Filled 2020-01-04: qty 1

## 2020-01-04 MED ORDER — SODIUM CHLORIDE 0.9 % IV SOLN
Freq: Once | INTRAVENOUS | Status: AC
  Filled 2020-01-04: qty 250

## 2020-01-04 MED ORDER — ACETAMINOPHEN 325 MG PO TABS
ORAL_TABLET | ORAL | Status: AC
  Filled 2020-01-04: qty 2

## 2020-01-04 MED ORDER — SODIUM CHLORIDE 0.9% FLUSH
10.0000 mL | INTRAVENOUS | Status: DC | PRN
  Administered 2020-01-04: 10 mL
  Filled 2020-01-04: qty 10

## 2020-01-04 MED ORDER — HEPARIN SOD (PORK) LOCK FLUSH 100 UNIT/ML IV SOLN
500.0000 [IU] | Freq: Once | INTRAVENOUS | Status: AC | PRN
  Administered 2020-01-04: 500 [IU]
  Filled 2020-01-04: qty 5

## 2020-01-23 NOTE — Progress Notes (Signed)
[facility]   [phone] [phone]   Clinic Follow up Note   Patient Care Team: Lorrene Reid, PA-C as PCP - General Bensimhon, Shaune Pascal, MD as PCP - Advanced Heart Failure (Cardiology) Mcarthur Rossetti, MD as Consulting Physician (Orthopedic Surgery) Molli Posey, MD as Consulting Physician (Obstetrics and Gynecology) Erroll Luna, MD as Consulting Physician (General Surgery) Truitt Merle, MD as Consulting Physician (Hematology) Kyung Rudd, MD as Consulting Physician (Radiation Oncology) Mauro Kaufmann, RN as Oncology Nurse Navigator Rockwell Germany, RN as Oncology Nurse Navigator Alla Feeling, NP as Nurse Practitioner (Nurse Practitioner)  Date of Service:  01/25/2020  CHIEF COMPLAINT: Follow-up right breast cancer  SUMMARY OF ONCOLOGIC HISTORY: Oncology History Overview Note  Cancer Staging Malignant neoplasm of upper-outer quadrant of right breast in female, estrogen receptor positive ([place]) Staging form: Breast, AJCC 8th Edition - Clinical stage from 12/29/2018: Stage IA (cT1b, cN0, cM0, G2, ER+, PR+, HER2+) - Unsigned - Pathologic stage from 01/27/2019: Stage IA (pT1c, pN0, cM0, G3, ER+, PR+, HER2+) - Signed by Truitt Merle, MD on 02/06/2019 Cancer Staging No matching staging information was found for the patient.    Malignant neoplasm of upper-outer quadrant of right breast in female, estrogen receptor positive ([place])  12/22/2018 Mammogram    Diagnostic Mammogram 12/22/18  IMPRESSION: 1. 9 mm mass, with associated calcifications and architectural distortion, in the 11 o'clock position of the right breast, highly suspicious for breast carcinoma. 2. Questionable mass noted in the inferior aspect of the right breast remains equivocal on diagnostic imaging may reflect focal fibroglandular tissue. There is no sonographic correlate.   12/23/2018 Initial Biopsy   Diagnosis 12/23/18 Breast, right, needle core biopsy, 11 o'clock  position - INVASIVE DUCTAL CARCINOMA, SEE COMMENT. - DUCTAL CARCINOMA IN SITU WITH NECROSIS.   12/28/2018 Initial Diagnosis   Malignant neoplasm of upper-outer quadrant of right breast in female, estrogen receptor positive ([place])   01/13/2019 Imaging   MRI Breast 01/13/19  IMPRESSION: 1. Biopsy proven malignancy in the upper-outer right breast. No additional suspicious findings on the right. 2. No MRI evidence of malignancy on the left. 3. No suspicious lymphadenopathy.   01/27/2019 Surgery   RIGHT BREAST LUMPECTOMY WITH RADIOACTIVE SEED AND SENTINEL LYMPH NODE MAPPING and PAC placement by Dr Brantley Stage  01/27/19   01/27/2019 Pathology Results   FINAL MICROSCOPIC DIAGNOSIS: 01/27/19  A. BREAST, RIGHT, LUMPECTOMY:  - Invasive ductal carcinoma, grade 3, spanning 1.3 cm.  - High grade ductal carcinoma in situ with necrosis.  - Biopsy site.  - Final resection margins are negative for carcinoma.  - See oncology table.   B. BREAST, RIGHT ADDITIONAL SUPERIOR MARGIN, EXCISION:  - Fibrocystic change and adenosis.  - No malignancy identified.   C. LYMPH NODE, RIGHT #1, SENTINEL, BIOPSY:  - One of one lymph nodes negative for carcinoma (0/1).   D. LYMPH NODE, RIGHT, SENTINEL, BIOPSY:  - One of one lymph nodes negative for carcinoma (0/1).   E. LYMPH NODE, RIGHT, SENTINEL, BIOPSY:  - One of one lymph nodes negative for carcinoma (0/1).   F. LYMPH NODE, RIGHT, SENTINEL, BIOPSY:  - One of one lymph nodes negative for carcinoma (0/1).       01/27/2019 Cancer Staging   Staging form: Breast, AJCC 8th Edition - Pathologic stage from 01/27/2019: Stage IA (pT1c, pN0, cM0, G3, ER+, PR+, HER2+) - Signed by Truitt Merle, MD on 02/06/2019   02/08/2019 Imaging   CT AP W Contrast 02/08/19  IMPRESSION: 1. Wall  thickening of the gastric antrum may represent gastritis.   Aortic Atherosclerosis (ICD10-I70.0).   02/23/2019 - 01/25/2020 Chemotherapy   Adjuvant weekly Abraxane with trastuzumab-anns (Kanjinti)  q3weeks starting 02/23/19. Completed 12 week of Abraxane on 05/11/19. Maintenance trastuzumab-anns (Kanjinti) q3weeks start 05/18/19. Plan to complete 01/25/20.   06/13/2019 - 08/01/2019 Radiation Therapy   Radiation with Dr Lisbeth Renshaw starting 06/13/19-08/01/19   08/2019 -  Anti-estrogen oral therapy   Letrozole, beginning 08/25/2019    09/28/2019 Survivorship   SCP delivered by Cira Rue, NP       CURRENT THERAPY:  Letrozole once daily starting 08/25/19  INTERVAL HISTORY:  Alicia Hahn is here for a follow up. She presents to the clinic alone. She notes she is doing well. She denies any new changes. She notes she is tolerating Letrozole well with manageable muscle aches. She tries to remain active which helps. She noes she has completely recovered from prior chemo. She notes she plans to see her surgeon next month and may ask about plastic surgery for breast reduction and lift.     REVIEW OF SYSTEMS:  Constitutional: Denies fevers, chills or abnormal weight loss Eyes: Denies blurriness of vision Ears, nose, mouth, throat, and face: Denies mucositis or sore throat Respiratory: Denies cough, dyspnea or wheezes Cardiovascular: Denies palpitation, chest discomfort or lower extremity swelling Gastrointestinal:  Denies nausea, heartburn or change in bowel habits Skin: Denies abnormal skin rashes Lymphatics: Denies new lymphadenopathy or easy bruising Neurological:Denies numbness, tingling or new weaknesses Behavioral/Psych: Mood is stable, no new changes  All other systems were reviewed with the patient and are negative.  MEDICAL HISTORY:  Past Medical History:  Diagnosis Date  . Breast cancer ([place])   . Cancer [facility])    breast cancer  . Gastric ulcer   . Hyperlipidemia   . Personal history of chemotherapy   . Personal history of radiation therapy     SURGICAL HISTORY: Past Surgical History:  Procedure Laterality Date  . AUGMENTATION MAMMAPLASTY    . BREAST LUMPECTOMY    . BREAST  LUMPECTOMY WITH RADIOACTIVE SEED AND SENTINEL LYMPH NODE BIOPSY Right 01/27/2019   Procedure: RIGHT BREAST LUMPECTOMY WITH RADIOACTIVE SEED AND SENTINEL LYMPH NODE MAPPING;  Surgeon: Erroll Luna, MD;  Location: [place];  Service: General;  Laterality: Right;  . COSMETIC SURGERY Bilateral 2000   breast aug/ lipo  . PORTACATH PLACEMENT Right 01/27/2019   Procedure: INSERTION PORT-A-CATH WITH ULTRASOUND;  Surgeon: Erroll Luna, MD;  Location: [place];  Service: General;  Laterality: Right;    I have reviewed the social history and family history with the patient and they are unchanged from previous note.  ALLERGIES:  has No Known Allergies.  MEDICATIONS:  Current Outpatient Medications  Medication Sig Dispense Refill  . CALCIUM PO Take 1,000 mg by mouth daily.    Marland Kitchen letrozole (FEMARA) 2.5 MG tablet Take 1 tablet (2.5 mg total) by mouth daily. 90 tablet 3  . Magnesium 125 MG CAPS Take 1 capsule by mouth daily.     . Multiple Vitamins-Minerals (MULTIVITAMIN WITH MINERALS) tablet Take 1 tablet by mouth daily.    . traZODone (DESYREL) 50 MG tablet Take 1 tablet (50 mg total) by mouth at bedtime as needed for sleep. 90 tablet 1  . venlafaxine XR (EFFEXOR-XR) 37.5 MG 24 hr capsule Take 2 capsules (75 mg total) by mouth daily with breakfast. 60 capsule 6  . VITAMIN D PO Take 12,000 Units by mouth daily.  No current facility-administered medications for this visit.   Facility-Administered Medications Ordered in Other Visits  Medication Dose Route Frequency Provider Last Rate Last Admin  . sodium chloride flush (NS) 0.9 % injection 10 mL  10 mL Intracatheter PRN Truitt Merle, MD   10 mL at 01/25/20 1458    PHYSICAL EXAMINATION: ECOG PERFORMANCE STATUS: 0 - Asymptomatic  Vitals:   01/25/20 1252  BP: 137/87  Pulse: 75  Resp: 18  Temp: (!) 97.2 F (36.2 C)  SpO2: 99%   Filed Weights   01/25/20 1252  Weight: 175 lb 6.4 oz (79.6 kg)     GENERAL:alert, no distress and comfortable SKIN: skin color, texture, turgor are normal, no rashes or significant lesions EYES: normal, Conjunctiva are pink and non-injected, sclera clear  NECK: supple, thyroid normal size, non-tender, without nodularity LYMPH:  no palpable lymphadenopathy in the cervical, axillary  LUNGS: clear to auscultation and percussion with normal breathing effort HEART: regular rate & rhythm and no murmurs and no lower extremity edema ABDOMEN:abdomen soft, non-tender and normal bowel sounds Musculoskeletal:no cyanosis of digits and no clubbing  NEURO: alert & oriented x 3 with fluent speech, no focal motor/sensory deficits BREAST: s/p right lumpectomy: Surgical incision healed well. (+) Right breast skin harder and darker with mild lymphedema. Left breast exam benign.   LABORATORY DATA:  I have reviewed the data as listed CBC Latest Ref Rng & Units 01/25/2020 01/04/2020 11/02/2019  WBC 4.0 - 10.5 K/uL 3.9(L) 4.5 4.1  Hemoglobin 12.0 - 15.0 g/dL 13.6 14.0 13.0  Hematocrit 36.0 - 46.0 % 38.7 40.9 37.9  Platelets 150 - 400 K/uL 179 174 163     CMP Latest Ref Rng & Units 01/25/2020 01/04/2020 11/02/2019  Glucose 70 - 99 mg/dL 114(H) 117(H) 88  BUN 8 - 23 mg/dL $Remove'18 16 16  'sUzname$ Creatinine 0.44 - 1.00 mg/dL 1.02(H) 0.81 0.77  Sodium 135 - 145 mmol/L 141 140 140  Potassium 3.5 - 5.1 mmol/L 4.3 4.3 4.0  Chloride 98 - 111 mmol/L 108 107 106  CO2 22 - 32 mmol/L 24 36(H) 30  Calcium 8.9 - 10.3 mg/dL 9.2 10.2 9.0  Total Protein 6.5 - 8.1 g/dL 6.5 7.0 6.2(L)  Total Bilirubin 0.3 - 1.2 mg/dL 0.4 0.3 0.3  Alkaline Phos 38 - 126 U/L 71 83 73  AST 15 - 41 U/L $Remo'23 27 22  'RFvtp$ ALT 0 - 44 U/L 37 43 34      RADIOGRAPHIC STUDIES: I have personally reviewed the radiological images as listed and agreed with the findings in the report. No results found.   ASSESSMENT & PLAN:  Alicia Hahn is a 60 y.o. female with    1.Malignant neoplasm of upper-outer quadrant of right  breast,invasive ductal carcinoma,pT1cN0M0,stageIA,Triple Positive,Grade3 -She was recently diagnosed in 12/2018.She has 42mm mass of right breast with invasive ductal carcinoma and components of DCIS.She underwent right lumpectomy and adjuvant radiation.  -She completed adjuvant chemotherapy with Weekly Abraxane and trastuzumab-anns (Kanjinti) 02/23/19-05/11/19 and plans to complete maintenance Kanjinti today (01/25/20). -She started antiestrogen therapy with Letrozole in 08/2019. She is tolerating well with manageable body aches so far.  -She is overall stable. 12/2019 Mammogram unremarkable and exam today benign with mild right breast edema. Labs reviewed and adequate to proceed with final Kanjinti today.  -She plans to f/u with surgeon next month and may ask about plastic surgery referral for breast reduction and lift.  -Continue Letrozole. Continue surveillance. Given her family history of breast cancer, I discussed the option  of additional screening with annual breast MRIs give her high risk of breast cancer (>20% life time) I also discussed Abbreviated MRIs which have $400 out-of-pocket cost if insurance does not cover this. I discussed MRIs are more sensitive testing which can lead to more workup. She is interested, plan to proceed in 06/2020.  -F/u in 4 months    2. Hot Flashes  -Secondary to Letrozole  -On Effexor 37.5-$RemoveBeforeDE'75mg'ecqnnuXnyhWjajk$ .   3. Osteopenia  -Her 07/2019 DEXA shows osteopenia with lowest T-score -1.3 at right hip.  -I discussed Letrozole can decrease her bone density. Will repeat DEXA in 07/2021.    4. Estrogen replacement andpostmenopausal vaginal atrophy  -Started after 2nd Mirena was taken out. Her Gyn put her on a third Mirena and bleeding stopped.  Vernia Buff estrogen replacement for several years for hot flashes. By her GYN. -I encouraged her to use lubricant with intercourse.  I also discussed option of M[place]isa procedure and encourage her to contact her Gyn.   5.  Genetics: Pt notes shehadgenetic testing before at [facility]. It was negative    PLAN: -Continue Letrozole  -Labs reviewed and adequate to proceed with final Kanjinti today  -she will see Dr. Ninfa Linden next month and plan to have her port removed  -screening breast MRI in 06/2020 -Lab and F/u in 4 months    No problem-specific Assessment & Plan notes found for this encounter.   Orders Placed This Encounter  Procedures  . MR BREAST BILATERAL W WO CONTRAST INC CAD    Standing Status:   Future    Standing Expiration Date:   01/24/2021    Order Specific Question:   If indicated for the ordered procedure, I authorize the administration of contrast media per Radiology protocol    Answer:   Yes    Order Specific Question:   What is the patient's sedation requirement?    Answer:   No Sedation    Order Specific Question:   Does the patient have a pacemaker or implanted devices?    Answer:   No    Order Specific Question:   Radiology Contrast Protocol - do NOT remove file path    Answer:   [email or web address]    Order Specific Question:   Preferred imaging location?    Answer:   [facility] (table limit - 550 lbs)   All questions were answered. The patient knows to call the clinic with any problems, questions or concerns. No barriers to learning was detected. The total time spent in the appointment was 30 minutes.     Truitt Merle, MD 01/25/2020   I, Joslyn Devon, am acting as scribe for Truitt Merle, MD.   I have reviewed the above documentation for accuracy and completeness, and I agree with the above.

## 2020-01-25 ENCOUNTER — Inpatient Hospital Stay: Payer: 59

## 2020-01-25 ENCOUNTER — Inpatient Hospital Stay (HOSPITAL_BASED_OUTPATIENT_CLINIC_OR_DEPARTMENT_OTHER): Payer: 59 | Admitting: Hematology

## 2020-01-25 ENCOUNTER — Encounter: Payer: Self-pay | Admitting: Hematology

## 2020-01-25 ENCOUNTER — Other Ambulatory Visit: Payer: Self-pay

## 2020-01-25 ENCOUNTER — Inpatient Hospital Stay: Payer: 59 | Attending: Hematology

## 2020-01-25 VITALS — BP 137/87 | HR 75 | Temp 97.2°F | Resp 18 | Ht 63.0 in | Wt 175.4 lb

## 2020-01-25 DIAGNOSIS — Z170 Estrogen receptor positive status [ER+]: Secondary | ICD-10-CM

## 2020-01-25 DIAGNOSIS — C50411 Malignant neoplasm of upper-outer quadrant of right female breast: Secondary | ICD-10-CM | POA: Insufficient documentation

## 2020-01-25 DIAGNOSIS — Z79899 Other long term (current) drug therapy: Secondary | ICD-10-CM | POA: Insufficient documentation

## 2020-01-25 DIAGNOSIS — Z5112 Encounter for antineoplastic immunotherapy: Secondary | ICD-10-CM | POA: Insufficient documentation

## 2020-01-25 DIAGNOSIS — Z95828 Presence of other vascular implants and grafts: Secondary | ICD-10-CM

## 2020-01-25 LAB — CMP (CANCER CENTER ONLY)
ALT: 37 U/L (ref 0–44)
AST: 23 U/L (ref 15–41)
Albumin: 3.5 g/dL (ref 3.5–5.0)
Alkaline Phosphatase: 71 U/L (ref 38–126)
Anion gap: 9 (ref 5–15)
BUN: 18 mg/dL (ref 8–23)
CO2: 24 mmol/L (ref 22–32)
Calcium: 9.2 mg/dL (ref 8.9–10.3)
Chloride: 108 mmol/L (ref 98–111)
Creatinine: 1.02 mg/dL — ABNORMAL HIGH (ref 0.44–1.00)
GFR, Estimated: >60 mL/min (ref >60)
Glucose, Bld: 114 mg/dL — ABNORMAL HIGH (ref 70–99)
Potassium: 4.3 mmol/L (ref 3.5–5.1)
Sodium: 141 mmol/L (ref 135–145)
Total Bilirubin: 0.4 mg/dL (ref 0.3–1.2)
Total Protein: 6.5 g/dL (ref 6.5–8.1)

## 2020-01-25 LAB — CBC WITH DIFFERENTIAL (CANCER CENTER ONLY)
Abs Immature Granulocytes: 0.01 10*3/uL (ref 0.00–0.07)
Basophils Absolute: 0 10*3/uL (ref 0.0–0.1)
Basophils Relative: 1 %
Eosinophils Absolute: 0.1 10*3/uL (ref 0.0–0.5)
Eosinophils Relative: 3 %
HCT: 38.7 % (ref 36.0–46.0)
Hemoglobin: 13.6 g/dL (ref 12.0–15.0)
Immature Granulocytes: 0 %
Lymphocytes Relative: 25 %
Lymphs Abs: 1 10*3/uL (ref 0.7–4.0)
MCH: 30.8 pg (ref 26.0–34.0)
MCHC: 35.1 g/dL (ref 30.0–36.0)
MCV: 87.8 fL (ref 80.0–100.0)
Monocytes Absolute: 0.5 10*3/uL (ref 0.1–1.0)
Monocytes Relative: 11 %
Neutro Abs: 2.3 10*3/uL (ref 1.7–7.7)
Neutrophils Relative %: 60 %
Platelet Count: 179 10*3/uL (ref 150–400)
RBC: 4.41 MIL/uL (ref 3.87–5.11)
RDW: 12.1 % (ref 11.5–15.5)
WBC Count: 3.9 10*3/uL — ABNORMAL LOW (ref 4.0–10.5)
nRBC: 0 % (ref 0.0–0.2)

## 2020-01-25 MED ORDER — ACETAMINOPHEN 325 MG PO TABS
ORAL_TABLET | ORAL | Status: AC
  Filled 2020-01-25: qty 2

## 2020-01-25 MED ORDER — SODIUM CHLORIDE 0.9% FLUSH
10.0000 mL | Freq: Once | INTRAVENOUS | Status: AC
  Administered 2020-01-25: 10 mL
  Filled 2020-01-25: qty 10

## 2020-01-25 MED ORDER — TRASTUZUMAB-ANNS CHEMO 150 MG IV SOLR
450.0000 mg | Freq: Once | INTRAVENOUS | Status: AC
  Administered 2020-01-25: 450 mg via INTRAVENOUS
  Filled 2020-01-25: qty 21.43

## 2020-01-25 MED ORDER — DIPHENHYDRAMINE HCL 25 MG PO CAPS
ORAL_CAPSULE | ORAL | Status: AC
  Filled 2020-01-25: qty 1

## 2020-01-25 MED ORDER — HEPARIN SOD (PORK) LOCK FLUSH 100 UNIT/ML IV SOLN
500.0000 [IU] | Freq: Once | INTRAVENOUS | Status: AC | PRN
  Administered 2020-01-25: 500 [IU]
  Filled 2020-01-25: qty 5

## 2020-01-25 MED ORDER — DIPHENHYDRAMINE HCL 25 MG PO CAPS
25.0000 mg | ORAL_CAPSULE | Freq: Once | ORAL | Status: AC
  Administered 2020-01-25: 25 mg via ORAL

## 2020-01-25 MED ORDER — SODIUM CHLORIDE 0.9% FLUSH
10.0000 mL | INTRAVENOUS | Status: DC | PRN
  Administered 2020-01-25: 10 mL
  Filled 2020-01-25: qty 10

## 2020-01-25 MED ORDER — SODIUM CHLORIDE 0.9 % IV SOLN
Freq: Once | INTRAVENOUS | Status: AC
  Filled 2020-01-25: qty 250

## 2020-01-25 MED ORDER — ACETAMINOPHEN 325 MG PO TABS
650.0000 mg | ORAL_TABLET | Freq: Once | ORAL | Status: AC
  Administered 2020-01-25: 650 mg via ORAL

## 2020-01-25 NOTE — Patient Instructions (Signed)
[facility] °Discharge Instructions for Patients Receiving Chemotherapy ° °Today you received the following chemotherapy agents Trastuzumab ° °To help prevent nausea and vomiting after your treatment, we encourage you to take your nausea medication as directed. °  °If you develop nausea and vomiting that is not controlled by your nausea medication, call the clinic.  ° °BELOW ARE SYMPTOMS THAT SHOULD BE REPORTED IMMEDIATELY: °· *FEVER GREATER THAN 100.5 F °· *CHILLS WITH OR WITHOUT FEVER °· NAUSEA AND VOMITING THAT IS NOT CONTROLLED WITH YOUR NAUSEA MEDICATION °· *UNUSUAL SHORTNESS OF BREATH °· *UNUSUAL BRUISING OR BLEEDING °· TENDERNESS IN MOUTH AND THROAT WITH OR WITHOUT PRESENCE OF ULCERS °· *URINARY PROBLEMS °· *BOWEL PROBLEMS °· UNUSUAL RASH °Items with * indicate a potential emergency and should be followed up as soon as possible. ° °Feel free to call the clinic should you have any questions or concerns. The clinic phone number is [phone]. ° °Please show the CHEMO ALERT CARD at check-in to the Emergency Department and triage nurse. ° ° °

## 2020-01-26 ENCOUNTER — Telehealth: Payer: Self-pay | Admitting: Hematology

## 2020-01-26 ENCOUNTER — Encounter: Payer: Self-pay | Admitting: *Deleted

## 2020-01-26 NOTE — Telephone Encounter (Signed)
Scheduled appts per 1/5 los. Pt confirmed appt date and time.  °

## 2020-02-17 ENCOUNTER — Other Ambulatory Visit: Payer: Self-pay | Admitting: Physician Assistant

## 2020-02-17 DIAGNOSIS — Z0000 Encounter for general adult medical examination without abnormal findings: Secondary | ICD-10-CM

## 2020-02-17 DIAGNOSIS — E785 Hyperlipidemia, unspecified: Secondary | ICD-10-CM

## 2020-02-17 DIAGNOSIS — R748 Abnormal levels of other serum enzymes: Secondary | ICD-10-CM

## 2020-02-21 ENCOUNTER — Other Ambulatory Visit: Payer: Self-pay

## 2020-02-21 ENCOUNTER — Other Ambulatory Visit: Payer: 59

## 2020-02-21 DIAGNOSIS — E785 Hyperlipidemia, unspecified: Secondary | ICD-10-CM

## 2020-02-21 DIAGNOSIS — R748 Abnormal levels of other serum enzymes: Secondary | ICD-10-CM

## 2020-02-21 DIAGNOSIS — Z0000 Encounter for general adult medical examination without abnormal findings: Secondary | ICD-10-CM

## 2020-02-22 ENCOUNTER — Ambulatory Visit (HOSPITAL_COMMUNITY)
Admission: RE | Admit: 2020-02-22 | Discharge: 2020-02-22 | Disposition: A | Discharge status: Home / Self Care | Payer: 59 | Source: Ambulatory Visit | Attending: Internal Medicine | Admitting: Internal Medicine

## 2020-02-22 ENCOUNTER — Ambulatory Visit (HOSPITAL_BASED_OUTPATIENT_CLINIC_OR_DEPARTMENT_OTHER)
Admission: RE | Admit: 2020-02-22 | Discharge: 2020-02-22 | Disposition: A | Discharge status: Home / Self Care | Payer: 59 | Source: Ambulatory Visit | Attending: Internal Medicine | Admitting: Internal Medicine

## 2020-02-22 ENCOUNTER — Encounter (HOSPITAL_COMMUNITY): Payer: Self-pay | Admitting: Internal Medicine

## 2020-02-22 VITALS — BP 120/78 | HR 78 | Wt 172.6 lb

## 2020-02-22 DIAGNOSIS — C50411 Malignant neoplasm of upper-outer quadrant of right female breast: Secondary | ICD-10-CM

## 2020-02-22 DIAGNOSIS — Z803 Family history of malignant neoplasm of breast: Secondary | ICD-10-CM | POA: Insufficient documentation

## 2020-02-22 DIAGNOSIS — Z87891 Personal history of nicotine dependence: Secondary | ICD-10-CM | POA: Insufficient documentation

## 2020-02-22 DIAGNOSIS — I509 Heart failure, unspecified: Secondary | ICD-10-CM | POA: Insufficient documentation

## 2020-02-22 DIAGNOSIS — Z170 Estrogen receptor positive status [ER+]: Secondary | ICD-10-CM

## 2020-02-22 DIAGNOSIS — Z0189 Encounter for other specified special examinations: Secondary | ICD-10-CM | POA: Diagnosis not present

## 2020-02-22 DIAGNOSIS — R600 Localized edema: Secondary | ICD-10-CM

## 2020-02-22 DIAGNOSIS — Z01818 Encounter for other preprocedural examination: Secondary | ICD-10-CM | POA: Insufficient documentation

## 2020-02-22 LAB — COMPREHENSIVE METABOLIC PANEL
ALT: 35 IU/L — ABNORMAL HIGH (ref 0–32)
AST: 26 IU/L (ref 0–40)
Albumin/Globulin Ratio: 2.3 — ABNORMAL HIGH (ref 1.2–2.2)
Albumin: 4.5 g/dL (ref 3.8–4.8)
Alkaline Phosphatase: 88 IU/L (ref 44–121)
BUN/Creatinine Ratio: 18 (ref 12–28)
BUN: 17 mg/dL (ref 8–27)
Bilirubin Total: 0.4 mg/dL (ref 0.0–1.2)
CO2: 24 mmol/L (ref 20–29)
Calcium: 9.7 mg/dL (ref 8.7–10.3)
Chloride: 103 mmol/L (ref 96–106)
Creatinine, Ser: 0.92 mg/dL (ref 0.57–1.00)
GFR calc Af Amer: 78 mL/min/{1.73_m2} (ref >59)
GFR calc non Af Amer: 67 mL/min/{1.73_m2} (ref >59)
Globulin, Total: 2 g/dL (ref 1.5–4.5)
Glucose: 91 mg/dL (ref 65–99)
Potassium: 4.1 mmol/L (ref 3.5–5.2)
Sodium: 141 mmol/L (ref 134–144)
Total Protein: 6.5 g/dL (ref 6.0–8.5)

## 2020-02-22 LAB — LIPID PANEL
Chol/HDL Ratio: 4.2 ratio (ref 0.0–4.4)
Cholesterol, Total: 297 mg/dL — ABNORMAL HIGH (ref 100–199)
HDL: 70 mg/dL (ref >39)
LDL Chol Calc (NIH): 203 mg/dL — ABNORMAL HIGH (ref 0–99)
Triglycerides: 136 mg/dL (ref 0–149)
VLDL Cholesterol Cal: 24 mg/dL (ref 5–40)

## 2020-02-22 LAB — CBC
Hematocrit: 44 % (ref 34.0–46.6)
Hemoglobin: 15.3 g/dL (ref 11.1–15.9)
MCH: 30.8 pg (ref 26.6–33.0)
MCHC: 34.8 g/dL (ref 31.5–35.7)
MCV: 89 fL (ref 79–97)
Platelets: 191 10*3/uL (ref 150–450)
RBC: 4.96 x10E6/uL (ref 3.77–5.28)
RDW: 12.6 % (ref 11.7–15.4)
WBC: 4 10*3/uL (ref 3.4–10.8)

## 2020-02-22 LAB — HEMOGLOBIN A1C
Est. average glucose Bld gHb Est-mCnc: 111 mg/dL
Hgb A1c MFr Bld: 5.5 % (ref 4.8–5.6)

## 2020-02-22 LAB — ECHOCARDIOGRAM COMPLETE
Area-P 1/2: 4.6 cm2
Calc EF: 57.2 %
S' Lateral: 3.5 cm
Single Plane A2C EF: 60.4 %
Single Plane A4C EF: 53.3 %

## 2020-02-22 LAB — TSH: TSH: 2.2 u[IU]/mL (ref 0.450–4.500)

## 2020-02-22 NOTE — Progress Notes (Signed)
CARDIO-ONCOLOGY CLINIC NOTE  Referring Physician: Dr. Burr Medico   HPI:  Alicia Hahn is 60 y.o. 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 Burr Medico for enrollment into the Cardio-Oncology program.  SUMMARY OF ONCOLOGIC HISTORY:     Oncology History Overview Note   Cancer Staging Malignant neoplasm of upper-outer quadrant of right breast in female, estrogen receptor positive ([name]) Staging form: Breast, AJCC 8th Edition - Clinical stage from 12/29/2018: Stage IA (cT1b, cN0, cM0, G2, ER+, PR+, HER2+) - Unsigned - Pathologic stage from 01/27/2019: Stage IA (pT1c, pN0, cM0, G3, ER+, PR+, HER2+) - Signed by Truitt Merle, MD on 02/06/2019 Cancer Staging No matching staging information was found for the patient.    Malignant neoplasm of upper-outer quadrant of right breast in female, estrogen receptor positive ([name])   12/22/2018 Mammogram     Diagnostic Mammogram 12/22/18  IMPRESSION: 1. 9 mm mass, with associated calcifications and architectural distortion, in the 11 o'clock position of the right breast, highly suspicious for breast carcinoma. 2. Questionable mass noted in the inferior aspect of the right breast remains equivocal on diagnostic imaging may reflect focal fibroglandular tissue. There is no sonographic correlate.    12/23/2018 Initial Biopsy    Diagnosis 12/23/18 Breast, right, needle core biopsy, 11 o'clock position - INVASIVE DUCTAL CARCINOMA, SEE COMMENT. - DUCTAL CARCINOMA IN SITU WITH NECROSIS.    12/28/2018 Initial Diagnosis    Malignant neoplasm of upper-outer quadrant of right breast in female, estrogen receptor positive ([name])    01/13/2019 Imaging    MRI Breast 01/13/19  IMPRESSION: 1. Biopsy proven malignancy in the upper-outer right breast. No additional suspicious findings on the right. 2. No MRI evidence of malignancy on the left. 3. No suspicious lymphadenopathy.    01/27/2019 Surgery    RIGHT BREAST  LUMPECTOMY WITH RADIOACTIVE SEED AND SENTINEL LYMPH NODE MAPPING and PAC placement by Dr Brantley Stage  01/27/19    01/27/2019 Pathology Results    FINAL MICROSCOPIC DIAGNOSIS: 01/27/19  A. BREAST, RIGHT, LUMPECTOMY:  - Invasive ductal carcinoma, grade 3, spanning 1.3 cm.  - High grade ductal carcinoma in situ with necrosis.  - Biopsy site.  - Final resection margins are negative for carcinoma.  - See oncology table.   B. BREAST, RIGHT ADDITIONAL SUPERIOR MARGIN, EXCISION:  - Fibrocystic change and adenosis.  - No malignancy identified.   C. LYMPH NODE, RIGHT #1, SENTINEL, BIOPSY:  - One of one lymph nodes negative for carcinoma (0/1).   D. LYMPH NODE, RIGHT, SENTINEL, BIOPSY:  - One of one lymph nodes negative for carcinoma (0/1).   E. LYMPH NODE, RIGHT, SENTINEL, BIOPSY:  - One of one lymph nodes negative for carcinoma (0/1).   F. LYMPH NODE, RIGHT, SENTINEL, BIOPSY:  - One of one lymph nodes negative for carcinoma (0/1).        01/27/2019 Cancer Staging    Staging form: Breast, AJCC 8th Edition - Pathologic stage from 01/27/2019: Stage IA (pT1c, pN0, cM0, G3, ER+, PR+, HER2+) - Signed by Truitt Merle, MD on 02/06/2019    02/08/2019 Imaging    CT AP W Contrast 02/08/19  IMPRESSION: 1. Wall thickening of the gastric antrum may represent gastritis.  Aortic Atherosclerosis (ICD10-I70.0).    02/23/2019 -  Chemotherapy    Adjuvant weekly Abraxane with Herceptin q3weeks starting2/3/21      Here for routine f/u. Doing great. Finished Herceptin in 1/22. No CP or SOB. Swelling resolved. BP improved.    Echo  today 2/22: EF 60-65% grade I-II DD .GLS -16.3 % Personally reviewed  Echo 10/21 EF 60-65% RV normal.   Echo: 12/20: EF 55-60% grade I DD Echo 04/13/19: EF 60-65%  Echo 07/15/19 EF 60-65%     Past Medical History:  Diagnosis Date  . Breast cancer ([place])   . Cancer [facility])    breast cancer  . Gastric ulcer   . Hyperlipidemia   . Personal history of chemotherapy    . Personal history of radiation therapy     Current Outpatient Medications  Medication Sig Dispense Refill  . CALCIUM PO Take 1,000 mg by mouth daily.    Marland Kitchen letrozole (FEMARA) 2.5 MG tablet Take 1 tablet (2.5 mg total) by mouth daily. 90 tablet 3  . Magnesium 125 MG CAPS Take 1 capsule by mouth daily.     . Multiple Vitamins-Minerals (MULTIVITAMIN WITH MINERALS) tablet Take 1 tablet by mouth daily.    . traZODone (DESYREL) 50 MG tablet Take 1 tablet (50 mg total) by mouth at bedtime as needed for sleep. 90 tablet 1  . venlafaxine XR (EFFEXOR-XR) 37.5 MG 24 hr capsule Take 2 capsules (75 mg total) by mouth daily with breakfast. 60 capsule 6  . VITAMIN D PO Take 12,000 Units by mouth daily.     No current facility-administered medications for this encounter.    No Known Allergies    Social History   Socioeconomic History  . Marital status: Married    Spouse name: Not on file  . Number of children: 2  . Years of education: Not on file  . Highest education level: Not on file  Occupational History  . Not on file  Tobacco Use  . Smoking status: Former Smoker    Packs/day: 0.25    Years: 5.00    Pack years: 1.25    Quit date: 01/20/1978    Years since quitting: 42.1  . Smokeless tobacco: Never Used  . Tobacco comment: 1982  Vaping Use  . Vaping Use: Never used  Substance and Sexual Activity  . Alcohol use: Yes    Alcohol/week: 6.0 standard drinks    Types: 6 Glasses of wine per week    Comment: occasional  . Drug use: No  . Sexual activity: Yes    Birth control/protection: None  Other Topics Concern  . Not on file  Social History Narrative  . Not on file   Social Determinants of Health   Financial Resource Strain: Not on file  Food Insecurity: Not on file  Transportation Needs: Not on file  Physical Activity: Not on file  Stress: Not on file  Social Connections: Not on file  Intimate Partner Violence: Not on file      Family History  Problem Relation Age of  Onset  . Diabetes Mother   . Heart disease Mother   . Hyperlipidemia Mother   . Breast cancer Mother 23  . Cancer Mother        breast DCIS  . Cancer Father 12       prostate cancer   . Diabetes Father   . Breast cancer Sister 92  . Cancer Sister 61       breast cancer   . Cancer Maternal Aunt 55       breast cancer   . Cancer Maternal Grandfather        colon cancer  . Colon cancer Maternal Grandfather   . Esophageal cancer Neg Hx   . Rectal cancer Neg Hx   .  Stomach cancer Neg Hx     Vitals:   02/22/20 1350  BP: 120/78  Pulse: 78  SpO2: 98%  Weight: 78.3 kg (172 lb 9.6 oz)    PHYSICAL EXAM: General:  Well appearing. No resp difficulty HEENT: normal Neck: supple. no JVD. Carotids 2+ bilat; no bruits. No lymphadenopathy or thryomegaly appreciated. Cor: PMI nondisplaced. Regular rate & rhythm. No rubs, gallops or murmurs. Lungs: clear Abdomen: soft, nontender, nondistended. No hepatosplenomegaly. No bruits or masses. Good bowel sounds. Extremities: no cyanosis, clubbing, rash, edema Neuro: alert & orientedx3, cranial nerves grossly intact. moves all 4 extremities w/o difficulty. Affect pleasant   ASSESSMENT & PLAN:  1. Right Breast Cancer - diagnosed 12/20 - s/p lumpectomy - echo 11/17/19 EF 60-65%  - echo today 02/22/20 EF 60-65% - has finished Herceptin - can f/u as needed  2. Mild LE edema - resolved  Glori Bickers, MD  2:30 PM

## 2020-02-22 NOTE — Progress Notes (Signed)
  Echocardiogram 2D Echocardiogram has been performed.  [name] 02/22/2020, 1:35 PM

## 2020-02-22 NOTE — Patient Instructions (Addendum)
Your physician recommends that you schedule a follow-up as needed  If you have any questions or concerns before your next appointment please send [place] a message through [place] or call our office at [phone].    TO LEAVE A MESSAGE FOR THE NURSE SELECT OPTION 2, PLEASE LEAVE A MESSAGE INCLUDING: . YOUR NAME . DATE OF BIRTH . CALL BACK NUMBER . REASON FOR CALL**this is important as we prioritize the call backs  YOU WILL RECEIVE A CALL BACK THE SAME DAY AS LONG AS YOU CALL BEFORE 4:00 PM

## 2020-02-22 NOTE — Addendum Note (Signed)
Encounter addended by: Malena Edman, RN on: 02/22/2020 2:38 PM  Actions taken: Clinical Note Signed

## 2020-02-23 ENCOUNTER — Encounter: Payer: Self-pay | Admitting: Physician Assistant

## 2020-02-23 ENCOUNTER — Other Ambulatory Visit: Payer: Self-pay

## 2020-02-23 ENCOUNTER — Ambulatory Visit (INDEPENDENT_AMBULATORY_CARE_PROVIDER_SITE_OTHER): Payer: 59 | Admitting: Physician Assistant

## 2020-02-23 VITALS — BP 119/78 | HR 85 | Temp 98.4°F | Ht 63.0 in | Wt 173.4 lb

## 2020-02-23 DIAGNOSIS — Z0000 Encounter for general adult medical examination without abnormal findings: Secondary | ICD-10-CM

## 2020-02-23 DIAGNOSIS — Z23 Encounter for immunization: Secondary | ICD-10-CM

## 2020-02-23 DIAGNOSIS — F5104 Psychophysiologic insomnia: Secondary | ICD-10-CM | POA: Diagnosis not present

## 2020-02-23 DIAGNOSIS — E785 Hyperlipidemia, unspecified: Secondary | ICD-10-CM

## 2020-02-23 DIAGNOSIS — F4329 Adjustment disorder with other symptoms: Secondary | ICD-10-CM

## 2020-02-23 MED ORDER — ROSUVASTATIN CALCIUM 20 MG PO TABS: 20.0000 mg | ORAL_TABLET | Freq: Every day | ORAL | 2 refills | Status: DC

## 2020-02-23 MED ORDER — TRAZODONE HCL 50 MG PO TABS: 50.0000 mg | ORAL_TABLET | Freq: Every evening | ORAL | 1 refills | Status: DC | PRN

## 2020-02-23 NOTE — Progress Notes (Signed)
Female Physical   Impression and Recommendations:    1. Healthcare maintenance   2. Need for shingles vaccine   3. Need for Tdap vaccination   4. Hyperlipidemia, unspecified hyperlipidemia type   5. Psychophysiological insomnia   6. Stress and adjustment reaction      1) Anticipatory Guidance: Skin CA prevention- recommend to use sunscreen when outside along with skin surveillance; eating a balanced and modest diet; physical activity at least 25 minutes per day or minimum of 150 min/ week moderate to intense activity.  2) Immunizations / Screenings / Labs:   All immunizations are up-to-date per recommendations or will be updated today if pt allows.    - Patient understands with dental and vision screens they will schedule independently.  - Obtained CBC, CMP, HgA1c, Lipid panel, and TSH.  Most labs are essentially within normal limits or stable from prior with the exception of lipid panel.  Total cholesterol and LDL increased from prior. The 10-year ASCVD risk score [name] DC Jr., et al., 2013) is: 3.6% -UTD on mammogram, pap smear, colonoscopy, influenza and covid vaccines. -Agreeable to Tdap and Shingrix.  3) Weight:  Recommend to improve diet habits to improve overall feelings of well being and objective health data. Improve nutrient density of diet through increasing intake of fruits and vegetables and decreasing saturated fats, white flour products and refined sugars.  4) Healthcare Maintenance: -Continue current medication regimen. -Discussed with patient starting statin therapy and is agreeable to Crestor 20 mg. Recent hepatic function shows mild elevation of ALT so will start at a lower dose and consider dose adjustment as needed. Advised to let me know if unable to tolerate medication. Recommend to reduce saturated and trans fats. -Stay well hydrated.  -Continue to follow up with various specialists.  -Follow up in 4 months for HLD. Lab visit in 6 weeks for lipid panel,  hepatic function.    Meds ordered this encounter  Medications  . rosuvastatin (CRESTOR) 20 MG tablet    Sig: Take 1 tablet (20 mg total) by mouth daily.    Dispense:  30 tablet    Refill:  2    Order Specific Question:   Supervising Provider    Answer:   Beatrice Lecher D [[id number]]  . traZODone (DESYREL) 50 MG tablet    Sig: Take 1 tablet (50 mg total) by mouth at bedtime as needed for sleep.    Dispense:  90 tablet    Refill:  1    Order Specific Question:   Supervising Provider    Answer:   Beatrice Lecher D [[id number]]    Orders Placed This Encounter  Procedures  . Tdap vaccine greater than or equal to 7yo IM  . Varicella-zoster vaccine IM     Return in about 4 months (around 06/22/2020) for HLD; lab visit in 6 weeks for FBW (lipid panel, hepatic function).     Gross side effects, risk and benefits, and alternatives of medications discussed with patient.  Patient is aware that all medications have potential side effects and we are unable to predict every side effect or drug-drug interaction that may occur.  Expresses verbal understanding and consents to current therapy plan and treatment regimen.  F-up preventative CPE in 1 year- this is in addition to any chronic care visits.    Please see orders placed and AVS handed out to patient at the end of our visit for further patient instructions/ counseling done pertaining to today's office visit.  Subjective:     CPE HPI: Alicia Hahn is a 60 y.o. female who presents to [place] at [facility] today for a yearly health maintenance exam.   Health Maintenance Summary  - Reviewed and updated, unless pt declines services.  Last Cologuard or Colonoscopy:    06/03/2019- repeat in 10 yers Tobacco History Reviewed:  Y, former smoker Alcohol and/or drug use:    No concerns; no use Dental Home:Y, not recently Eye exams:Y Female Health:  PAP Smear - last known results:  Followed by Ob-Gyn, will  request most recent pap. STD concerns:   none Birth control method:  n/a Menses regular:  n/a Breast Cancer Family History:  Currently on Letrozole for malignant neoplasm of UQ of right breast invasive ductal carcinoma   Additional concerns beyond health maintenance issues: none    Immunization History  Administered Date(s) Administered  . Influenza,inj,Quad PF,6+ Mos 11/02/2018  . Influenza-Unspecified 11/02/2018, 11/28/2019  . PFIZER Comirnaty([place])Covid-19 Tri-Sucrose Vaccine 11/14/2019     Health Maintenance  Topic Date Due  . Hepatitis C Screening  Never done  . HIV Screening  Never done  . TETANUS/TDAP  Never done  . PAP SMEAR-Modifier  Never done  . COVID-19 Vaccine (2 - Pfizer risk 4-dose series) 12/05/2019  . MAMMOGRAM  12/25/2021  . COLONOSCOPY (Pts 45-31yrs Insurance coverage will need to be confirmed)  06/02/2029  . INFLUENZA VACCINE  Completed     Wt Readings from Last 3 Encounters:  02/23/20 173 lb 6.4 oz (78.7 kg)  02/22/20 172 lb 9.6 oz (78.3 kg)  01/25/20 175 lb 6.4 oz (79.6 kg)   BP Readings from Last 3 Encounters:  02/23/20 119/78  02/22/20 120/78  01/25/20 137/87   Pulse Readings from Last 3 Encounters:  02/23/20 85  02/22/20 78  01/25/20 75     Past Medical History:  Diagnosis Date  . Breast cancer ([place])   . Cancer [facility])    breast cancer  . Gastric ulcer   . Hyperlipidemia   . Personal history of chemotherapy   . Personal history of radiation therapy       Past Surgical History:  Procedure Laterality Date  . AUGMENTATION MAMMAPLASTY    . BREAST LUMPECTOMY    . BREAST LUMPECTOMY WITH RADIOACTIVE SEED AND SENTINEL LYMPH NODE BIOPSY Right 01/27/2019   Procedure: RIGHT BREAST LUMPECTOMY WITH RADIOACTIVE SEED AND SENTINEL LYMPH NODE MAPPING;  Surgeon: Erroll Luna, MD;  Location: [place];  Service: General;  Laterality: Right;  . COSMETIC SURGERY Bilateral 2000   breast aug/ lipo  . PORTACATH PLACEMENT Right  01/27/2019   Procedure: INSERTION PORT-A-CATH WITH ULTRASOUND;  Surgeon: Erroll Luna, MD;  Location: [place];  Service: General;  Laterality: Right;      Family History  Problem Relation Age of Onset  . Diabetes Mother   . Heart disease Mother   . Hyperlipidemia Mother   . Breast cancer Mother 4  . Cancer Mother        breast DCIS  . Cancer Father 52       prostate cancer   . Diabetes Father   . Breast cancer Sister 47  . Cancer Sister 26       breast cancer   . Cancer Maternal Aunt 55       breast cancer   . Cancer Maternal Grandfather        colon cancer  . Colon cancer Maternal Grandfather   . Esophageal  cancer Neg Hx   . Rectal cancer Neg Hx   . Stomach cancer Neg Hx       Social History   Substance and Sexual Activity  Drug Use No  ,   Social History   Substance and Sexual Activity  Alcohol Use Yes  . Alcohol/week: 6.0 standard drinks  . Types: 6 Glasses of wine per week   Comment: occasional  ,   Social History   Tobacco Use  Smoking Status Former Smoker  . Packs/day: 0.25  . Years: 5.00  . Pack years: 1.25  . Quit date: 01/20/1978  . Years since quitting: 42.1  Smokeless Tobacco Never Used  Tobacco Comment   1982  ,   Social History   Substance and Sexual Activity  Sexual Activity Yes  . Birth control/protection: None    Current Outpatient Medications on File Prior to Visit  Medication Sig Dispense Refill  . CALCIUM PO Take 1,000 mg by mouth daily.    Marland Kitchen letrozole (FEMARA) 2.5 MG tablet Take 1 tablet (2.5 mg total) by mouth daily. 90 tablet 3  . Magnesium 125 MG CAPS Take 1 capsule by mouth daily.     . Multiple Vitamins-Minerals (MULTIVITAMIN WITH MINERALS) tablet Take 1 tablet by mouth daily.    Marland Kitchen venlafaxine XR (EFFEXOR-XR) 37.5 MG 24 hr capsule Take 2 capsules (75 mg total) by mouth daily with breakfast. 60 capsule 6  . VITAMIN D PO Take 12,000 Units by mouth daily.    . [DISCONTINUED] prochlorperazine  (COMPAZINE) 10 MG tablet Take 1 tablet (10 mg total) by mouth every 6 (six) hours as needed (Nausea or vomiting). 30 tablet 1   No current facility-administered medications on file prior to visit.    Allergies: Patient has no known allergies.  Review of Systems: General:   Denies fever, chills, unexplained weight loss.  Optho/Auditory:   Denies visual changes, blurred vision/LOV Respiratory:   Denies SOB, DOE more than baseline levels.   Cardiovascular:   Denies chest pain, palpitations, new onset peripheral edema  Gastrointestinal:   Denies nausea, vomiting, diarrhea.  Genitourinary: Denies dysuria, freq/ urgency, flank pain or discharge from genitals.  Endocrine:     Denies hot or cold intolerance, polyuria, polydipsia. Musculoskeletal:   Denies unexplained myalgias, joint swelling, unexplained arthralgias, gait problems.  Skin:  Denies rash, suspicious lesions Neurological:     Denies dizziness, unexplained weakness, numbness  Psychiatric/Behavioral:   Denies mood changes, suicidal or homicidal ideations, hallucinations    Objective:    Blood pressure 119/78, pulse 85, temperature 98.4 F (36.9 C), height 5\' 3"  (1.6 m), weight 173 lb 6.4 oz (78.7 kg), SpO2 100 %. Body mass index is 30.72 kg/m. General Appearance:    Alert, cooperative, no distress, appears stated age  Head:    Normocephalic, without obvious abnormality, atraumatic  Eyes:    PERRL, conjunctiva/corneas clear, EOM's intact, both eyes  Ears:    Normal TM's and external ear canals, both ears  Nose:   Nares normal, septum midline, mucosa normal, no drainage    or sinus tenderness  Throat:   Lips w/o lesion, mucosa moist, and tongue normal; teeth and   gums normal  Neck:   Supple, symmetrical, trachea midline, no adenopathy;    thyroid:  no enlargement/tenderness/nodules  Back:     Symmetric, no curvature, ROM normal, no CVA tenderness  Lungs:     Clear to auscultation bilaterally, respirations unlabored, no        Wh/ R/ R  Chest Wall:    No tenderness or gross deformity; normal excursion   Heart:    Regular rate and rhythm, S1 and S2 normal, no murmur  Breast Exam:    Deferred to Ob-Gyn.  Abdomen:     Soft, non-tender, bowel sounds active all four quadrants, No   G/R/R, no masses, no organomegaly  Genitalia:   Deferred to Ob-Gyn.  Rectal:   Deferred to Ob-Gyn.  Extremities:   Extremities normal, atraumatic, no cyanosis or gross edema  Pulses:   2+ and symmetric all extremities  Skin:   Warm, dry, Skin color, texture, turgor normal, no obvious rashes or lesions Psych: No HI/SI, judgement and insight good, Euthymic mood. Full Affect.  Neurologic:   CNII-XII grossly intact, normal strength, sensation and reflexes throughout

## 2020-02-23 NOTE — Patient Instructions (Addendum)
Heart-Healthy Eating Plan Heart-healthy meal planning includes:  Eating less unhealthy fats.  Eating more healthy fats.  Making other changes in your diet. Talk with your doctor or a diet specialist (dietitian) to create an eating plan that is right for you. What is my plan? Your doctor may recommend an eating plan that includes:  Total fat: ______% or less of total calories a day.  Saturated fat: ______% or less of total calories a day.  Cholesterol: less than _________mg a day. What are tips for following this plan? Cooking Avoid frying your food. Try to bake, boil, grill, or broil it instead. You can also reduce fat by:  Removing the skin from poultry.  Removing all visible fats from meats.  Steaming vegetables in water or broth. Meal planning  At meals, divide your plate into four equal parts: ? Fill one-half of your plate with vegetables and green salads. ? Fill one-fourth of your plate with whole grains. ? Fill one-fourth of your plate with lean protein foods.  Eat 4-5 servings of vegetables per day. A serving of vegetables is: ? 1 cup of raw or cooked vegetables. ? 2 cups of raw leafy greens.  Eat 4-5 servings of fruit per day. A serving of fruit is: ? 1 medium whole fruit. ?  cup of dried fruit. ?  cup of fresh, frozen, or canned fruit. ?  cup of 100% fruit juice.  Eat more foods that have soluble fiber. These are apples, broccoli, carrots, beans, peas, and barley. Try to get 20-30 g of fiber per day.  Eat 4-5 servings of nuts, legumes, and seeds per week: ? 1 serving of dried beans or legumes equals  cup after being cooked. ? 1 serving of nuts is  cup. ? 1 serving of seeds equals 1 tablespoon.   General information  Eat more home-cooked food. Eat less restaurant, buffet, and fast food.  Limit or avoid alcohol.  Limit foods that are high in starch and sugar.  Avoid fried foods.  Lose weight if you are overweight.  Keep track of how much salt  (sodium) you eat. This is important if you have high blood pressure. Ask your doctor to tell you more about this.  Try to add vegetarian meals each week. Fats  Choose healthy fats. These include olive oil and canola oil, flaxseeds, walnuts, almonds, and seeds.  Eat more omega-3 fats. These include salmon, mackerel, sardines, tuna, flaxseed oil, and ground flaxseeds. Try to eat fish at least 2 times each week.  Check food labels. Avoid foods with trans fats or high amounts of saturated fat.  Limit saturated fats. ? These are often found in animal products, such as meats, butter, and cream. ? These are also found in plant foods, such as palm oil, palm kernel oil, and coconut oil.  Avoid foods with partially hydrogenated oils in them. These have trans fats. Examples are stick margarine, some tub margarines, cookies, crackers, and other baked goods. What foods can I eat? Fruits All fresh, canned (in natural juice), or frozen fruits. Vegetables Fresh or frozen vegetables (raw, steamed, roasted, or grilled). Green salads. Grains Most grains. Choose whole wheat and whole grains most of the time. Rice and pasta, including brown rice and pastas made with whole wheat. Meats and other proteins Lean, well-trimmed beef, veal, pork, and lamb. Chicken and turkey without skin. All fish and shellfish. Wild duck, rabbit, pheasant, and venison. Egg whites or low-cholesterol egg substitutes. Dried beans, peas, lentils, and tofu. Seeds and   most nuts. Dairy Low-fat or nonfat cheeses, including ricotta and mozzarella. Skim or 1% milk that is liquid, powdered, or evaporated. Buttermilk that is made with low-fat milk. Nonfat or low-fat yogurt. Fats and oils Non-hydrogenated (trans-free) margarines. Vegetable oils, including soybean, sesame, sunflower, olive, peanut, safflower, corn, canola, and cottonseed. Salad dressings or mayonnaise made with a vegetable oil. Beverages Mineral water. Coffee and tea. Diet  carbonated beverages. Sweets and desserts Sherbet, gelatin, and fruit ice. Small amounts of dark chocolate. Limit all sweets and desserts. Seasonings and condiments All seasonings and condiments. The items listed above may not be a complete list of foods and drinks you can eat. Contact a dietitian for more options. What foods should I avoid? Fruits Canned fruit in heavy syrup. Fruit in cream or butter sauce. Fried fruit. Limit coconut. Vegetables Vegetables cooked in cheese, cream, or butter sauce. Fried vegetables. Grains Breads that are made with saturated or trans fats, oils, or whole milk. Croissants. Sweet rolls. Donuts. High-fat crackers, such as cheese crackers. Meats and other proteins Fatty meats, such as hot dogs, ribs, sausage, bacon, rib-eye roast or steak. High-fat deli meats, such as salami and bologna. Caviar. Domestic duck and goose. Organ meats, such as liver. Dairy Cream, sour cream, cream cheese, and creamed cottage cheese. Whole-milk cheeses. Whole or 2% milk that is liquid, evaporated, or condensed. Whole buttermilk. Cream sauce or high-fat cheese sauce. Yogurt that is made from whole milk. Fats and oils Meat fat, or shortening. Cocoa butter, hydrogenated oils, palm oil, coconut oil, palm kernel oil. Solid fats and shortenings, including bacon fat, salt pork, lard, and butter. Nondairy cream substitutes. Salad dressings with cheese or sour cream. Beverages Regular sodas and juice drinks with added sugar. Sweets and desserts Frosting. Pudding. Cookies. Cakes. Pies. Milk chocolate or white chocolate. Buttered syrups. Full-fat ice cream or ice cream drinks. The items listed above may not be a complete list of foods and drinks to avoid. Contact a dietitian for more information. Summary  Heart-healthy meal planning includes eating less unhealthy fats, eating more healthy fats, and making other changes in your diet.  Eat a balanced diet. This includes fruits and  vegetables, low-fat or nonfat dairy, lean protein, nuts and legumes, whole grains, and heart-healthy oils and fats. This information is not intended to replace advice given to you by your health care provider. Make sure you discuss any questions you have with your health care provider. Document Revised: 03/12/2017 Document Reviewed: 02/13/2017 Elsevier Patient Education  2021 Elsevier Inc.    Preventive Care 40-64 Years Old, Female Preventive care refers to lifestyle choices and visits with your health care provider that can promote health and wellness. This includes:  A yearly physical exam. This is also called an annual wellness visit.  Regular dental and eye exams.  Immunizations.  Screening for certain conditions.  Healthy lifestyle choices, such as: ? Eating a healthy diet. ? Getting regular exercise. ? Not using drugs or products that contain nicotine and tobacco. ? Limiting alcohol use. What can I expect for my preventive care visit? Physical exam Your health care provider will check your:  Height and weight. These may be used to calculate your BMI (body mass index). BMI is a measurement that tells if you are at a healthy weight.  Heart rate and blood pressure.  Body temperature.  Skin for abnormal spots. Counseling Your health care provider may ask you questions about your:  Past medical problems.  Family's medical history.  Alcohol, tobacco, and drug use.    Emotional well-being.  Home life and relationship well-being.  Sexual activity.  Diet, exercise, and sleep habits.  Work and work Statistician.  Access to firearms.  Method of birth control.  Menstrual cycle.  Pregnancy history. What immunizations do I need? Vaccines are usually given at various ages, according to a schedule. Your health care provider will recommend vaccines for you based on your age, medical history, and lifestyle or other factors, such as travel or where you work.   What  tests do I need? Blood tests  Lipid and cholesterol levels. These may be checked every 5 years, or more often if you are over 66 years old.  Hepatitis C test.  Hepatitis B test. Screening  Lung cancer screening. You may have this screening every year starting at age 71 if you have a 30-pack-year history of smoking and currently smoke or have quit within the past 15 years.  Colorectal cancer screening. ? All adults should have this screening starting at age 41 and continuing until age 57. ? Your health care provider may recommend screening at age 99 if you are at increased risk. ? You will have tests every 1-10 years, depending on your results and the type of screening test.  Diabetes screening. ? This is done by checking your blood sugar (glucose) after you have not eaten for a while (fasting). ? You may have this done every 1-3 years.  Mammogram. ? This may be done every 1-2 years. ? Talk with your health care provider about when you should start having regular mammograms. This may depend on whether you have a family history of breast cancer.  BRCA-related cancer screening. This may be done if you have a family history of breast, ovarian, tubal, or peritoneal cancers.  Pelvic exam and Pap test. ? This may be done every 3 years starting at age 8. ? Starting at age 22, this may be done every 5 years if you have a Pap test in combination with an HPV test. Other tests  STD (sexually transmitted disease) testing, if you are at risk.  Bone density scan. This is done to screen for osteoporosis. You may have this scan if you are at high risk for osteoporosis. Talk with your health care provider about your test results, treatment options, and if necessary, the need for more tests. Follow these instructions at home: Eating and drinking  Eat a diet that includes fresh fruits and vegetables, whole grains, lean protein, and low-fat dairy products.  Take vitamin and mineral supplements  as recommended by your health care provider.  Do not drink alcohol if: ? Your health care provider tells you not to drink. ? You are pregnant, may be pregnant, or are planning to become pregnant.  If you drink alcohol: ? Limit how much you have to 0-1 drink a day. ? Be aware of how much alcohol is in your drink. In the [place] one drink equals one 12 oz bottle of beer (355 mL), one 5 oz glass of wine (148 mL), or one 1 oz glass of hard liquor (44 mL).   Lifestyle  Take daily care of your teeth and gums. Brush your teeth every morning and night with fluoride toothpaste. Floss one time each day.  Stay active. Exercise for at least 30 minutes 5 or more days each week.  Do not use any products that contain nicotine or tobacco, such as cigarettes, e-cigarettes, and chewing tobacco. If you need help quitting, ask your health care provider.  Do not  use drugs.  If you are sexually active, practice safe sex. Use a condom or other form of protection to prevent STIs (sexually transmitted infections).  If you do not wish to become pregnant, use a form of birth control. If you plan to become pregnant, see your health care provider for a prepregnancy visit.  If told by your health care provider, take low-dose aspirin daily starting at age 3.  Find healthy ways to cope with stress, such as: ? Meditation, yoga, or listening to music. ? Journaling. ? Talking to a trusted person. ? Spending time with friends and family. Safety  Always wear your seat belt while driving or riding in a vehicle.  Do not drive: ? If you have been drinking alcohol. Do not ride with someone who has been drinking. ? When you are tired or distracted. ? While texting.  Wear a helmet and other protective equipment during sports activities.  If you have firearms in your house, make sure you follow all gun safety procedures. What's next?  Visit your health care provider once a year for an annual wellness visit.  Ask  your health care provider how often you should have your eyes and teeth checked.  Stay up to date on all vaccines. This information is not intended to replace advice given to you by your health care provider. Make sure you discuss any questions you have with your health care provider. Document Revised: 10/11/2019 Document Reviewed: 09/17/2017 Elsevier Patient Education  2021 [employer].

## 2020-03-05 ENCOUNTER — Ambulatory Visit: Payer: Self-pay | Admitting: Surgery

## 2020-03-05 NOTE — H&P (Signed)
Alicia Hahn Appointment: 03/05/2020 11:50 AM Location: [facility] Patient #: [id number] DOB: 05-02-1960 Married / Language: Alicia Hahn / Race: White Female  History of Present Illness Alicia Moores A. Jaxon Flatt MD; 03/05/2020 12:07 PM) Patient words: Patient returns for follow-up of her stage I right breast cancer triple positive treated with breast conserving surgery followed by radiation therapy and chemotherapy. She has completed chemotherapy once her port out. She also has interest in breast reduction surgery more due to size and less from cosmetic change from treatment.     Malignant neoplasm of upper-outer quadrant of right breast, invasive ductal carcinoma, [id number], stage IA, Triple Positive, Grade 3 -She was recently diagnosed in 12/2018. 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 She underwent right lumpectomy and adjuvant radiation. -She completed adjuvant chemotherapy with Weekly Abraxane and trastuzumab-anns (Kanjinti) 02/23/19-05/11/19 and plans to complete maintenance Kanjinti today (01/25/20). -She started antiestrogen therapy with Letrozole in 08/2019. She is tolerating well with manageable body aches so far. -She is overall stable. 12/2019 Mammogram unremarkable and exam today benign with mild right breast edema. Labs reviewed and adequate to proceed with final Kanjinti today.              60 year old female status post malignant right lumpectomy with radiation treatment in 2021. This is the patient's first post lumpectomy mammogram.  EXAM: DIGITAL DIAGNOSTIC BILATERAL MAMMOGRAM WITH IMPLANTS, CAD AND TOMO  The patient has retropectoral implants. Standard and implant displaced views were performed.  COMPARISON: Previous exam(s).  ACR Breast Density Category b: There are scattered areas of fibroglandular density.  FINDINGS: Interval post lumpectomy changes noted in the upper-outer quadrant of the right breast.  Diffuse right-sided skin and trabecular thickening is consistent with radiation related changes. No new or suspicious mammographic findings in either breast.  Mammographic images were processed with CAD.  IMPRESSION: 1. No mammographic evidence of malignancy in either breast. 2. Interval right breast posttreatment changes.  RECOMMENDATION: Diagnostic mammogram is suggested in 1 year. (Code:[id number]-B-01Y)  I have discussed the findings and recommendations with the patient. If applicable, a reminder letter will be sent to the patient regarding the next appointment.  BI-RADS CATEGORY 2: Benign.   Electronically Signed By: Alicia Hahn M.D. On: 12/26/2019 15:53.  The patient is a 60 year old female.   Allergies Alicia Forehand, CNA; 03/05/2020 11:40 AM) No Known Allergies [12/27/2018]: Allergies Reconciled  Medication History Alicia Forehand, CNA; 03/05/2020 11:40 AM) traZODone HCl (50MG  Tablet, Oral) Active. Magnesium (125MG  Capsule, Oral) Active. Melatonin (3MG  Tablet, Oral) Active. Multiple Minerals-Vitamins (Oral) Active. Calcium (500MG  Tablet, Oral) Active. Vitamin D (Cholecalciferol) (25 MCG(1000 UT) Capsule, Oral) Active. Medications Reconciled     Physical Exam (Edwinna Rochette A. Kingsly Kloepfer MD; 03/05/2020 12:09 PM)  General Mental Status-Alert. General Appearance-Consistent with stated age. Hydration-Well hydrated. Voice-Normal.  Breast Note: Right breast scar noted. About 4 cm above the nipple is a small 5 mm sebaceous cyst in the skin. Posttreatment and radiation changes noted. Right-sided port in place and is clean. Left breast is normal. overall large pendulous breasts  Cardiovascular Cardiovascular examination reveals -normal heart sounds, regular rate and rhythm with no murmurs and normal pedal pulses bilaterally.  Neurologic Neurologic evaluation reveals -alert and oriented x 3 with no impairment of recent or remote memory. Mental  Status-Normal.  Lymphatic Head & Neck  General Head & Neck Lymphatics: Bilateral - Description - Normal. Axillary  General Axillary Region: Bilateral - Description - Normal. Tenderness - Non Tender.    Assessment &  Plan (Liliahna Cudd A. Langford Carias MD; 03/05/2020 12:02 PM)  BREAST CANCER, RIGHT (C50.911) Impression: Doing well Scheduled for port removal. Risks and benefits of port removal discussed with the patient to include but not exclusive of bleeding, infection, embolus, catheter fragmentation, damage to blood vessels, damage to nerves, blood clot, anesthesia risks, and less likely death.  Current Plans I recommended surgery to remove the catheter. I explained the technique of removal with use of local anesthesia & possible need for more aggressive sedation/anesthesia for patient comfort.  Risks such as bleeding, infection, and other risks were discussed. Post-operative dressing/incision care was discussed. I noted a good likelihood this will help address the problem. We will work to minimize complications. Questions were answered. The patient expresses understanding & wishes to proceed with surgery.  Pt Education - [employer] Free Text Education/Instructions: discussed with patient and provided information.

## 2020-03-23 ENCOUNTER — Ambulatory Visit: Payer: 59 | Admitting: Plastic Surgery

## 2020-03-23 ENCOUNTER — Encounter: Payer: Self-pay | Admitting: Plastic Surgery

## 2020-03-23 ENCOUNTER — Other Ambulatory Visit: Payer: Self-pay

## 2020-03-23 VITALS — BP 153/91 | HR 84 | Ht 63.0 in | Wt 177.2 lb

## 2020-03-23 DIAGNOSIS — T66XXXA Radiation sickness, unspecified, initial encounter: Secondary | ICD-10-CM | POA: Diagnosis not present

## 2020-03-23 DIAGNOSIS — N62 Hypertrophy of breast: Secondary | ICD-10-CM | POA: Diagnosis not present

## 2020-03-23 DIAGNOSIS — C50411 Malignant neoplasm of upper-outer quadrant of right female breast: Secondary | ICD-10-CM

## 2020-03-23 DIAGNOSIS — N6489 Other specified disorders of breast: Secondary | ICD-10-CM | POA: Diagnosis not present

## 2020-03-23 DIAGNOSIS — Z170 Estrogen receptor positive status [ER+]: Secondary | ICD-10-CM

## 2020-03-23 NOTE — Progress Notes (Signed)
Patient ID: Alicia Hahn, female    DOB: [birth year], 60 y.o.   MRN: [id number]   Chief Complaint  Patient presents with  . Advice Only  . Breast Problem    The patient is a 60 year old female here for evaluation of her breast.  She underwent a partial mastectomy of the right breast in 2021.  She then had radiation which ended in July 2021.  She had implants placed by Dr. Luvenia Heller approximately 22 years ago.  She thinks that they were around 480 cc.  She thinks that they are under the muscle.  She does not have a card.  She thinks they may be saline.  She is not a smoker not on any blood thinners in the last mammogram was December 2021.  She complains of shoulder pain.  She has no radiculopathies.  She is currently a 40DD bra cup.  She is 5 feet 3 inches tall and weighs 179 pounds.  The sternal notch to right nipple is 27 cm and 30 cm on the left.  She is interested in being smaller and lifted.  She has radiation changes of the skin noted on the right breast.  There is a little bit of pink color and thickening of the skin.   Review of Systems  Constitutional: Negative.   HENT: Negative.   Eyes: Negative.   Respiratory: Negative for chest tightness and shortness of breath.   Cardiovascular: Negative for leg swelling.  Gastrointestinal: Negative for abdominal distention and abdominal pain.  Endocrine: Negative.   Genitourinary: Negative.   Musculoskeletal: Positive for back pain.  Skin: Positive for color change. Negative for rash and wound.  Neurological: Negative.   Hematological: Negative.   Psychiatric/Behavioral: Negative.     Past Medical History:  Diagnosis Date  . Breast cancer ([place])   . Cancer [facility])    breast cancer  . Gastric ulcer   . Hyperlipidemia   . Personal history of chemotherapy   . Personal history of radiation therapy     Past Surgical History:  Procedure Laterality Date  . AUGMENTATION MAMMAPLASTY    . BREAST LUMPECTOMY    . BREAST LUMPECTOMY WITH  RADIOACTIVE SEED AND SENTINEL LYMPH NODE BIOPSY Right 01/27/2019   Procedure: RIGHT BREAST LUMPECTOMY WITH RADIOACTIVE SEED AND SENTINEL LYMPH NODE MAPPING;  Surgeon: Erroll Luna, MD;  Location: [place];  Service: General;  Laterality: Right;  . COSMETIC SURGERY Bilateral 2000   breast aug/ lipo  . PORTACATH PLACEMENT Right 01/27/2019   Procedure: INSERTION PORT-A-CATH WITH ULTRASOUND;  Surgeon: Erroll Luna, MD;  Location: [place];  Service: General;  Laterality: Right;      Current Outpatient Medications:  .  CALCIUM PO, Take 1,000 mg by mouth daily., Disp: , Rfl:  .  letrozole (FEMARA) 2.5 MG tablet, Take 1 tablet (2.5 mg total) by mouth daily., Disp: 90 tablet, Rfl: 3 .  Magnesium 125 MG CAPS, Take 1 capsule by mouth daily. , Disp: , Rfl:  .  Multiple Vitamins-Minerals (MULTIVITAMIN WITH MINERALS) tablet, Take 1 tablet by mouth daily., Disp: , Rfl:  .  rosuvastatin (CRESTOR) 20 MG tablet, Take 1 tablet (20 mg total) by mouth daily., Disp: 30 tablet, Rfl: 2 .  traZODone (DESYREL) 50 MG tablet, Take 1 tablet (50 mg total) by mouth at bedtime as needed for sleep., Disp: 90 tablet, Rfl: 1 .  venlafaxine XR (EFFEXOR-XR) 37.5 MG 24 hr capsule, Take 2 capsules (75 mg total) by mouth daily  with breakfast., Disp: 60 capsule, Rfl: 6 .  VITAMIN D PO, Take 12,000 Units by mouth daily., Disp: , Rfl:    Objective:   Vitals:   03/23/20 0900  BP: (!) 153/91  Pulse: 84  SpO2: 97%    Physical Exam Vitals and nursing note reviewed.  Constitutional:      Appearance: Normal appearance.  HENT:     Head: Normocephalic and atraumatic.  Cardiovascular:     Rate and Rhythm: Normal rate.     Pulses: Normal pulses.  Pulmonary:     Effort: Pulmonary effort is normal. No respiratory distress.  Abdominal:     General: Abdomen is flat. There is no distension.     Tenderness: There is no abdominal tenderness.  Musculoskeletal:        General: Normal range of  motion.  Skin:    General: Skin is warm.     Capillary Refill: Capillary refill takes less than 2 seconds.     Coloration: Skin is not jaundiced.     Findings: Erythema present. No bruising.  Neurological:     General: No focal deficit present.     Mental Status: She is alert and oriented to person, place, and time.  Psychiatric:        Mood and Affect: Mood normal.        Behavior: Behavior normal.        Thought Content: Thought content normal.     Assessment & Plan:  Malignant neoplasm of upper-outer quadrant of right breast in female, estrogen receptor positive ([place])  Radiation injury, initial encounter  Symptomatic mammary hypertrophy  Postoperative breast asymmetry  The patient still has radiation changes noted on exam.  I highly recommend that she at least have the implants removed at the appropriate time.  It would be nice if she did not have to have any put back in.  We talked about options for removal and replacement at the same time with a mastopexy.  Another option is removal and wait to do the mastopexy.  At any rate I think that any surgery before 1 year from her radiation would be extremely risky especially considering her skin changes that I see today.  The patient agrees with this plan and we have invited her to come and see [place] in July.  Pictures were obtained of the patient and placed in the chart with the patient's or guardian's permission.   [place], DO

## 2020-03-28 ENCOUNTER — Encounter (HOSPITAL_BASED_OUTPATIENT_CLINIC_OR_DEPARTMENT_OTHER): Payer: Self-pay | Admitting: Surgery

## 2020-03-29 ENCOUNTER — Encounter (INDEPENDENT_AMBULATORY_CARE_PROVIDER_SITE_OTHER): Payer: Self-pay | Admitting: Family Medicine

## 2020-03-29 ENCOUNTER — Other Ambulatory Visit: Payer: Self-pay

## 2020-03-29 ENCOUNTER — Ambulatory Visit (INDEPENDENT_AMBULATORY_CARE_PROVIDER_SITE_OTHER): Payer: 59 | Admitting: Family Medicine

## 2020-03-29 VITALS — BP 113/75 | HR 79 | Temp 98.2°F | Ht 63.0 in | Wt 171.0 lb

## 2020-03-29 DIAGNOSIS — Z9189 Other specified personal risk factors, not elsewhere classified: Secondary | ICD-10-CM

## 2020-03-29 DIAGNOSIS — R5383 Other fatigue: Secondary | ICD-10-CM

## 2020-03-29 DIAGNOSIS — G4709 Other insomnia: Secondary | ICD-10-CM

## 2020-03-29 DIAGNOSIS — F411 Generalized anxiety disorder: Secondary | ICD-10-CM

## 2020-03-29 DIAGNOSIS — R0602 Shortness of breath: Secondary | ICD-10-CM | POA: Diagnosis not present

## 2020-03-29 DIAGNOSIS — R7401 Elevation of levels of liver transaminase levels: Secondary | ICD-10-CM

## 2020-03-29 DIAGNOSIS — Z1331 Encounter for screening for depression: Secondary | ICD-10-CM

## 2020-03-29 DIAGNOSIS — E559 Vitamin D deficiency, unspecified: Secondary | ICD-10-CM | POA: Diagnosis not present

## 2020-03-29 DIAGNOSIS — Z6830 Body mass index (BMI) 30.0-30.9, adult: Secondary | ICD-10-CM

## 2020-03-29 DIAGNOSIS — Z853 Personal history of malignant neoplasm of breast: Secondary | ICD-10-CM

## 2020-03-29 DIAGNOSIS — E669 Obesity, unspecified: Secondary | ICD-10-CM

## 2020-03-29 DIAGNOSIS — K279 Peptic ulcer, site unspecified, unspecified as acute or chronic, without hemorrhage or perforation: Secondary | ICD-10-CM

## 2020-03-29 DIAGNOSIS — E7849 Other hyperlipidemia: Secondary | ICD-10-CM | POA: Diagnosis not present

## 2020-03-29 DIAGNOSIS — R739 Hyperglycemia, unspecified: Secondary | ICD-10-CM

## 2020-03-29 DIAGNOSIS — Z0289 Encounter for other administrative examinations: Secondary | ICD-10-CM

## 2020-03-29 NOTE — Progress Notes (Signed)

## 2020-03-30 LAB — COMPREHENSIVE METABOLIC PANEL
ALT: 40 IU/L — ABNORMAL HIGH (ref 0–32)
AST: 28 IU/L (ref 0–40)
Albumin/Globulin Ratio: 1.9 (ref 1.2–2.2)
Albumin: 4.7 g/dL (ref 3.8–4.8)
Alkaline Phosphatase: 99 IU/L (ref 44–121)
BUN/Creatinine Ratio: 23 (ref 12–28)
BUN: 18 mg/dL (ref 8–27)
Bilirubin Total: 0.2 mg/dL (ref 0.0–1.2)
CO2: 22 mmol/L (ref 20–29)
Calcium: 9.9 mg/dL (ref 8.7–10.3)
Chloride: 102 mmol/L (ref 96–106)
Creatinine, Ser: 0.77 mg/dL (ref 0.57–1.00)
Globulin, Total: 2.5 g/dL (ref 1.5–4.5)
Glucose: 89 mg/dL (ref 65–99)
Potassium: 4.3 mmol/L (ref 3.5–5.2)
Sodium: 138 mmol/L (ref 134–144)
Total Protein: 7.2 g/dL (ref 6.0–8.5)
eGFR: 88 mL/min/{1.73_m2} (ref >59)

## 2020-03-30 LAB — ANEMIA PANEL
Ferritin: 163 ng/mL — ABNORMAL HIGH (ref 15–150)
Folate, Hemolysate: 529 ng/mL
Folate, RBC: 1158 ng/mL (ref >498)
Hematocrit: 45.7 % (ref 34.0–46.6)
Iron Saturation: 26 % (ref 15–55)
Iron: 93 ug/dL (ref 27–139)
Retic Ct Pct: 2.1 % (ref 0.6–2.6)
Total Iron Binding Capacity: 352 ug/dL (ref 250–450)
UIBC: 259 ug/dL (ref 118–369)
Vitamin B-12: 833 pg/mL (ref 232–1245)

## 2020-03-30 LAB — INSULIN, RANDOM: INSULIN: 6.8 u[IU]/mL (ref 2.6–24.9)

## 2020-03-30 LAB — VITAMIN D 25 HYDROXY (VIT D DEFICIENCY, FRACTURES): Vit D, 25-Hydroxy: 144 ng/mL — ABNORMAL HIGH (ref 30.0–100.0)

## 2020-04-02 ENCOUNTER — Other Ambulatory Visit (HOSPITAL_COMMUNITY)
Admission: RE | Admit: 2020-04-02 | Discharge: 2020-04-02 | Disposition: A | Discharge status: Home / Self Care | Payer: 59 | Source: Ambulatory Visit | Attending: Surgery | Admitting: Surgery

## 2020-04-02 DIAGNOSIS — Z20822 Contact with and (suspected) exposure to covid-19: Secondary | ICD-10-CM | POA: Insufficient documentation

## 2020-04-02 DIAGNOSIS — Z01812 Encounter for preprocedural laboratory examination: Secondary | ICD-10-CM | POA: Insufficient documentation

## 2020-04-03 LAB — SARS CORONAVIRUS 2 (TAT 6-24 HRS): SARS Coronavirus 2: NEGATIVE

## 2020-04-03 NOTE — Progress Notes (Signed)
Dear Dr. [name],   Thank you for referring Alicia Hahn to our clinic. The following note includes my evaluation and treatment recommendations.  Chief Complaint:   OBESITY Alicia Hahn (MR# [id number]) is a 60 y.o. female who presents for evaluation and treatment of obesity and related comorbidities. Current BMI is Body mass index is 30.29 kg/m. Alicia Hahn has been struggling with her weight for many years and has been unsuccessful in either losing weight, maintaining weight loss, or reaching her healthy weight goal.  Alicia Hahn is currently in the action stage of change and ready to dedicate time achieving and maintaining a healthier weight. Alicia Hahn is interested in becoming our patient and working on intensive lifestyle modifications including (but not limited to) diet and exercise for weight loss.  Alicia Hahn says she greatly craves sweets.  She eats less dairy.  Has yogurt occasionally.  Loves bread.  Goal is 140-150 pounds.  Alicia Hahn provided the following food recall: Breakfast:  Coffee (sugar-free sweetener/Truvia) and granola bar. Lunch:  Leftovers.  Does not like cold cuts.  [employer] or snack food. Dinner:  Meat (3 ounces) and vegetable and starch.  Alicia Hahn habits were reviewed today and are as follows: Her family eats meals together, she thinks her family will eat healthier with her, her desired weight loss is 25 pounds, she started gaining weight at age 63-50, her heaviest weight ever was 180 pounds, she craves sweets, she skips breakfast or lunch twice a week, she is frequently drinking liquids with calories, she frequently makes poor food choices, she has problems with excessive hunger, she frequently eats larger portions than normal and she struggles with emotional eating.  Depression Screen Alicia Hahn's Food and Mood (modified PHQ-9) score was 6.  Depression screen PHQ 2/9 03/29/2020  Decreased Interest 1  Down, Depressed, Hopeless 1  PHQ - 2 Score 2  Altered sleeping 1  Tired,  decreased energy 2  Change in appetite 1  Feeling bad or failure about yourself  0  Trouble concentrating 0  Moving slowly or fidgety/restless 0  Suicidal thoughts 0  PHQ-9 Score 6  Difficult doing work/chores Not difficult at all   Assessment/Plan:   1. Other fatigue Alicia Hahn denies daytime somnolence and reports waking up still tired. Patent has a history of symptoms of morning fatigue and snoring. Alicia Hahn generally gets 5-7 hours of sleep per night, and states that she has poor quality sleep. Snoring is present. Apneic episodes are not present. Epworth Sleepiness Score is 5.  [name] does feel that her weight is causing her energy to be lower than it should be. Fatigue may be related to obesity, depression or many other causes. Labs will be ordered, and in the meanwhile, Alicia Hahn will focus on self care including making healthy food choices, increasing physical activity and focusing on stress reduction.  - EKG 12-Lead - Anemia panel - Comprehensive metabolic panel  2. SOB (shortness of breath) on exertion Alicia Hahn notes increasing shortness of breath with exercising and seems to be worsening over time with weight gain. She notes getting out of breath sooner with activity than she used to. This has gotten worse recently. Alicia Hahn denies shortness of breath at rest or orthopnea.  Alicia Hahn does feel that she gets out of breath more easily that she used to when she exercises. Alicia Hahn's shortness of breath appears to be obesity related and exercise induced. She has agreed to work on weight loss and gradually increase exercise to treat her exercise induced shortness of breath. Will continue to  monitor closely.  3. Other hyperlipidemia Course: Not at goal. Lipid-lowering medications: Crestor 20 mg daily.   Plan: Dietary changes: Increase soluble fiber, decrease simple carbohydrates, decrease saturated fat. Exercise changes: Moderate to vigorous-intensity aerobic activity 150 minutes per week or as tolerated. We  will continue to monitor along with PCP/specialists as it pertains to her weight loss journey.  Lab Results  Component Value Date   CHOL 297 (H) 02/21/2020   HDL 70 02/21/2020   LDLCALC 203 (H) 02/21/2020   TRIG 136 02/21/2020   CHOLHDL 4.2 02/21/2020   Lab Results  Component Value Date   ALT 40 (H) 03/29/2020   AST 28 03/29/2020   ALKPHOS 99 03/29/2020   BILITOT 0.2 03/29/2020   The 10-year ASCVD risk score [name] DC Jr., et al., 2013) is: 3.3%   Values used to calculate the score:     Age: 56 years     Sex: Female     Is Non-Hispanic African American: No     Diabetic: No     Tobacco smoker: No     Systolic Blood Pressure: 761 mmHg     Is BP treated: No     HDL Cholesterol: 70 mg/dL     Total Cholesterol: 297 mg/dL  4. Vitamin D deficiency At goal. Current vitamin D is 76.8, tested on 02/02/2019. She is taking 12,000 IU daily.  Plan:   Will check vitamin D level today, as per below.  - VITAMIN D 25 Hydroxy (Vit-D Deficiency, Fractures)  5. History of breast cancer Alicia Hahn is taking letrozole 2.5 mg daily and Effexor XR 75 mg daily.  Implications of weight gain after cancer diagnosis and treatment: . Many cancer survivors gain weight after being diagnosed with cancer, particularly those treated with chemotherapy and women who transition to post-menopausal status as a result of cancer treatment. . There is now increased awareness of the availability and benefit of strategies to promote weight loss or prevent weight gain in cancer survivors. Studies with these strategies, and particularly those that include calorie restriction, increased physical activity and behavioural counselling, have consistently demonstrated that weight loss of 5-7% of body weight may reduce the incidence of other diseases, particularly diabetes and cardiovascular disease, and improve several general and cancer-specific outcomes. Alicia Hahn, [place] CM, [place], et al. American Society of Clinical  Oncology position statement on obesity and cancer. J Clin Oncol. 2014;32(31):3568-3574. doi:10.1200/JCO.2014.58.4680.  6. Other insomnia Current treatment: trazodone 50 mg at bedtime.  Plan: Recommend sleep hygiene measures including regular sleep schedule, optimal sleep environment, and relaxing presleep rituals.   7. Elevated ALT measurement Elevated liver transaminases with an ALT predominance combined with obesity and insulin resistance is characteristic, but not diagnostic of non-alcoholic fatty liver disease (NAFLD). NAFLD is the 2nd leading cause of liver transplant in adults. Treatment includes weight loss, elimination of sweet drinks, including juice, avoidance of high fructose corn syrup, and exercise. As always, avoiding alcohol consumption is important.  Lab Results  Component Value Date   ALT 40 (H) 03/29/2020   AST 28 03/29/2020   ALKPHOS 99 03/29/2020   BILITOT 0.2 03/29/2020   8. PUD (peptic ulcer disease), history She had 5 ulcers, cleared on EGD last May.  9. Hyperglycemia Will check labs today, as per below.  - Comprehensive metabolic panel - Insulin, random  10. GAD (generalized anxiety disorder) Behavior modification techniques were discussed today to help Alicia Hahn deal with her anxiety.    69. Depression screening Alicia Hahn was screened for depression  as part of her new patient workup today.  PHQ-9 is 6.  Alicia Hahn had a positive depression screening. Depression is commonly associated with obesity and often results in emotional eating behaviors. We will monitor this closely and work on CBT to help improve the non-hunger eating patterns. Referral to Psychology may be required if no improvement is seen as she continues in our clinic.  12. At risk for heart disease Due to Alicia Hahn current state of health and medical condition(s), she is at a higher risk for heart disease.  This puts the patient at much greater risk to subsequently develop cardiopulmonary conditions that can  significantly affect patient's quality of life in a negative manner.    At least 12 minutes were spent on counseling Alicia Hahn about these concerns today. Counseling:  Intensive lifestyle modifications were discussed with Alicia Hahn as the most appropriate first line of treatment. We will continue to reassess these conditions on a fairly regular basis in an attempt to decrease the patient's overall morbidity and mortality.  Evidence-based interventions for health behavior change were utilized today including the discussion of self monitoring techniques, problem-solving barriers, and SMART goal setting techniques.  Specifically, regarding patient's less desirable eating habits and patterns, we employed the technique of small changes when Alicia Hahn has not been able to fully commit to her prudent nutritional plan.  13. Class 1 obesity with serious comorbidity and body mass index (BMI) of 30.0 to 30.9 in adult, unspecified obesity type  Alicia Hahn is currently in the action stage of change and her goal is to continue with weight loss efforts. I recommend Alicia Hahn begin the structured treatment plan as follows:  She has agreed to the Category 1 Plan.  Exercise goals: No exercise has been prescribed at this time.   Behavioral modification strategies: increasing lean protein intake, decreasing simple carbohydrates, increasing vegetables, increasing water intake, decreasing liquid calories, decreasing alcohol intake, decreasing sodium intake and increasing high fiber foods.  She was informed of the importance of frequent follow-up visits to maximize her success with intensive lifestyle modifications for her multiple health conditions. She was informed we would discuss her lab results at her next visit unless there is a critical issue that needs to be addressed sooner. Alicia Hahn agreed to keep her next visit at the agreed upon time to discuss these results.  Objective:   Blood pressure 113/75, pulse 79, temperature 98.2 F (36.8  C), temperature source Oral, height 5\' 3"  (1.6 m), weight 171 lb (77.6 kg), SpO2 97 %. Body mass index is 30.29 kg/m.  EKG: Normal sinus rhythm, rate 80 bpm.  Indirect Calorimeter completed today shows a VO2 of 179 and a REE of 1253.  Her calculated basal metabolic rate is 8676 thus her basal metabolic rate is worse than expected.  General: Cooperative, alert, well developed, in no acute distress. HEENT: Conjunctivae and lids unremarkable. Cardiovascular: Regular rhythm.  Lungs: Normal work of breathing. Neurologic: No focal deficits.   Lab Results  Component Value Date   CREATININE 0.77 03/29/2020   BUN 18 03/29/2020   NA 138 03/29/2020   K 4.3 03/29/2020   CL 102 03/29/2020   CO2 22 03/29/2020   Lab Results  Component Value Date   ALT 40 (H) 03/29/2020   AST 28 03/29/2020   ALKPHOS 99 03/29/2020   BILITOT 0.2 03/29/2020   Lab Results  Component Value Date   HGBA1C 5.5 02/21/2020   HGBA1C 5.1 02/02/2019   Lab Results  Component Value Date   INSULIN 6.8 03/29/2020  Lab Results  Component Value Date   TSH 2.200 02/21/2020   Lab Results  Component Value Date   CHOL 297 (H) 02/21/2020   HDL 70 02/21/2020   LDLCALC 203 (H) 02/21/2020   TRIG 136 02/21/2020   CHOLHDL 4.2 02/21/2020   Lab Results  Component Value Date   WBC 4.0 02/21/2020   HGB 15.3 02/21/2020   HCT 45.7 03/29/2020   MCV 89 02/21/2020   PLT 191 02/21/2020   Lab Results  Component Value Date   IRON 93 03/29/2020   TIBC 352 03/29/2020   FERRITIN 163 (H) 03/29/2020   Attestation Statements:   This is the patient's first visit at [facility]. The patient's NEW PATIENT PACKET was reviewed at length. Included in the packet: current and past health history, medications, allergies, ROS, gynecologic history (women only), surgical history, family history, social history, weight history, weight loss surgery history (for those that have had weight loss surgery), nutritional evaluation,  mood and food questionnaire, PHQ9, Epworth questionnaire, sleep habits questionnaire, patient life and health improvement goals questionnaire. These will all be scanned into the patient's chart under media.   During the visit, I independently reviewed the patient's EKG, bioimpedance scale results, and indirect calorimeter results. I used this information to tailor a meal plan for the patient that will help her to lose weight and will improve her obesity-related conditions going forward. I performed a medically necessary appropriate examination and/or evaluation. I discussed the assessment and treatment plan with the patient. The patient was provided an opportunity to ask questions and all were answered. The patient agreed with the plan and demonstrated an understanding of the instructions. Labs were ordered at this visit and will be reviewed at the next visit unless more critical results need to be addressed immediately. Clinical information was updated and documented in the EMR.   I, Water quality scientist, CMA, am acting as transcriptionist for Briscoe Deutscher, DO  I have reviewed the above documentation for accuracy and completeness, and I agree with the above. Briscoe Deutscher, DO

## 2020-04-05 ENCOUNTER — Ambulatory Visit (HOSPITAL_BASED_OUTPATIENT_CLINIC_OR_DEPARTMENT_OTHER): Payer: 59 | Admitting: Certified Registered"

## 2020-04-05 ENCOUNTER — Encounter (HOSPITAL_BASED_OUTPATIENT_CLINIC_OR_DEPARTMENT_OTHER)
Admission: RE | Disposition: A | Discharge status: Home / Self Care | Payer: Self-pay | Source: Home / Self Care | Attending: Surgery

## 2020-04-05 ENCOUNTER — Ambulatory Visit (HOSPITAL_BASED_OUTPATIENT_CLINIC_OR_DEPARTMENT_OTHER)
Admission: RE | Admit: 2020-04-05 | Discharge: 2020-04-05 | Disposition: A | Discharge status: Home / Self Care | Payer: 59 | Attending: Surgery | Admitting: Surgery

## 2020-04-05 ENCOUNTER — Other Ambulatory Visit: Payer: 59

## 2020-04-05 ENCOUNTER — Other Ambulatory Visit: Payer: Self-pay

## 2020-04-05 ENCOUNTER — Encounter (HOSPITAL_BASED_OUTPATIENT_CLINIC_OR_DEPARTMENT_OTHER): Payer: Self-pay | Admitting: Surgery

## 2020-04-05 DIAGNOSIS — Z923 Personal history of irradiation: Secondary | ICD-10-CM | POA: Insufficient documentation

## 2020-04-05 DIAGNOSIS — Z9221 Personal history of antineoplastic chemotherapy: Secondary | ICD-10-CM | POA: Diagnosis not present

## 2020-04-05 DIAGNOSIS — Z452 Encounter for adjustment and management of vascular access device: Secondary | ICD-10-CM | POA: Insufficient documentation

## 2020-04-05 DIAGNOSIS — Z853 Personal history of malignant neoplasm of breast: Secondary | ICD-10-CM | POA: Diagnosis not present

## 2020-04-05 DIAGNOSIS — Z79899 Other long term (current) drug therapy: Secondary | ICD-10-CM | POA: Insufficient documentation

## 2020-04-05 HISTORY — PX: PORT-A-CATH REMOVAL: SHX5289

## 2020-04-05 HISTORY — DX: Gastro-esophageal reflux disease without esophagitis: K21.9

## 2020-04-05 SURGERY — REMOVAL PORT-A-CATH
Anesthesia: General | Site: Chest | Laterality: Right

## 2020-04-05 MED ORDER — PROPOFOL 500 MG/50ML IV EMUL
INTRAVENOUS | Status: DC | PRN
  Administered 2020-04-05: 100 ug/kg/min via INTRAVENOUS

## 2020-04-05 MED ORDER — ONDANSETRON HCL 4 MG/2ML IJ SOLN
INTRAMUSCULAR | Status: DC | PRN
  Administered 2020-04-05: 4 mg via INTRAVENOUS

## 2020-04-05 MED ORDER — CHLORHEXIDINE GLUCONATE CLOTH 2 % EX PADS: 6.0000 | MEDICATED_PAD | Freq: Once | CUTANEOUS | Status: DC

## 2020-04-05 MED ORDER — BUPIVACAINE-EPINEPHRINE 0.25% -1:200000 IJ SOLN
INTRAMUSCULAR | Status: DC | PRN
  Administered 2020-04-05: 15 mL

## 2020-04-05 MED ORDER — LACTATED RINGERS IV SOLN: INTRAVENOUS | Status: DC

## 2020-04-05 MED ORDER — ONDANSETRON HCL 4 MG/2ML IJ SOLN
INTRAMUSCULAR | Status: AC
  Filled 2020-04-05: qty 2

## 2020-04-05 MED ORDER — FENTANYL CITRATE (PF) 100 MCG/2ML IJ SOLN: 25.0000 ug | INTRAMUSCULAR | Status: DC | PRN

## 2020-04-05 MED ORDER — MIDAZOLAM HCL 2 MG/2ML IJ SOLN
INTRAMUSCULAR | Status: AC
  Filled 2020-04-05: qty 2

## 2020-04-05 MED ORDER — FENTANYL CITRATE (PF) 100 MCG/2ML IJ SOLN
INTRAMUSCULAR | Status: DC | PRN
  Administered 2020-04-05: 50 ug via INTRAVENOUS

## 2020-04-05 MED ORDER — PROPOFOL 500 MG/50ML IV EMUL
INTRAVENOUS | Status: AC
  Filled 2020-04-05: qty 50

## 2020-04-05 MED ORDER — CEFAZOLIN SODIUM-DEXTROSE 2-4 GM/100ML-% IV SOLN
2.0000 g | INTRAVENOUS | Status: AC
  Administered 2020-04-05: 2 g via INTRAVENOUS

## 2020-04-05 MED ORDER — IBUPROFEN 800 MG PO TABS: 800.0000 mg | ORAL_TABLET | Freq: Three times a day (TID) | ORAL | 0 refills | Status: DC | PRN

## 2020-04-05 MED ORDER — FENTANYL CITRATE (PF) 100 MCG/2ML IJ SOLN
INTRAMUSCULAR | Status: AC
  Filled 2020-04-05: qty 2

## 2020-04-05 MED ORDER — CEFAZOLIN SODIUM-DEXTROSE 2-4 GM/100ML-% IV SOLN
INTRAVENOUS | Status: AC
  Filled 2020-04-05: qty 100

## 2020-04-05 MED ORDER — MIDAZOLAM HCL 2 MG/2ML IJ SOLN
INTRAMUSCULAR | Status: DC | PRN
  Administered 2020-04-05: 2 mg via INTRAVENOUS

## 2020-04-05 SURGICAL SUPPLY — 32 items
ADH SKN CLS APL DERMABOND .7 (GAUZE/BANDAGES/DRESSINGS) ×1
APL PRP STRL LF DISP 70% ISPRP (MISCELLANEOUS) ×1
BLADE SURG 15 STRL LF DISP TIS (BLADE) ×1 IMPLANT
BLADE SURG 15 STRL SS (BLADE) ×2
CHLORAPREP W/TINT 26 (MISCELLANEOUS) ×2 IMPLANT
COVER BACK TABLE 60X90IN (DRAPES) ×2 IMPLANT
COVER MAYO STAND STRL (DRAPES) ×2 IMPLANT
DECANTER SPIKE VIAL GLASS SM (MISCELLANEOUS) ×2 IMPLANT
DERMABOND ADVANCED (GAUZE/BANDAGES/DRESSINGS) ×1
DERMABOND ADVANCED .7 DNX12 (GAUZE/BANDAGES/DRESSINGS) ×1 IMPLANT
DRAPE LAPAROTOMY 100X72 PEDS (DRAPES) ×2 IMPLANT
DRAPE UTILITY XL STRL (DRAPES) ×2 IMPLANT
ELECT REM PT RETURN 9FT ADLT (ELECTROSURGICAL) ×2
ELECTRODE REM PT RTRN 9FT ADLT (ELECTROSURGICAL) ×1 IMPLANT
GLOVE SRG 8 PF TXTR STRL LF DI (GLOVE) ×1 IMPLANT
GLOVE SURG ENC MOIS LTX SZ6.5 (GLOVE) ×2 IMPLANT
GLOVE SURG LTX SZ8 (GLOVE) ×2 IMPLANT
GLOVE SURG UNDER POLY LF SZ7 (GLOVE) ×2 IMPLANT
GLOVE SURG UNDER POLY LF SZ8 (GLOVE) ×2
GOWN STRL REUS W/ TWL LRG LVL3 (GOWN DISPOSABLE) ×2 IMPLANT
GOWN STRL REUS W/ TWL XL LVL3 (GOWN DISPOSABLE) ×1 IMPLANT
GOWN STRL REUS W/TWL LRG LVL3 (GOWN DISPOSABLE) ×4
GOWN STRL REUS W/TWL XL LVL3 (GOWN DISPOSABLE) ×2
NDL HYPO 25X1 1.5 SAFETY (NEEDLE) ×1 IMPLANT
NEEDLE HYPO 25X1 1.5 SAFETY (NEEDLE) ×2 IMPLANT
NS IRRIG 1000ML POUR BTL (IV SOLUTION) ×2 IMPLANT
PACK BASIN DAY SURGERY FS (CUSTOM PROCEDURE TRAY) ×2 IMPLANT
SPONGE LAP 4X18 RFD (DISPOSABLE) ×2 IMPLANT
SUT MON AB 4-0 PC3 18 (SUTURE) ×2 IMPLANT
SUT VICRYL 3-0 CR8 SH (SUTURE) ×2 IMPLANT
SYR CONTROL 10ML LL (SYRINGE) ×2 IMPLANT
TOWEL GREEN STERILE FF (TOWEL DISPOSABLE) ×2 IMPLANT

## 2020-04-05 NOTE — Anesthesia Preprocedure Evaluation (Addendum)
Anesthesia Evaluation  Patient identified by MRN, date of birth, ID band Patient awake    Reviewed: Allergy & Precautions, NPO status , Patient's Chart, lab work & pertinent test results  Airway Mallampati: II  TM Distance: >3 FB     Dental   Pulmonary former smoker,    breath sounds clear to auscultation       Cardiovascular negative cardio ROS   Rhythm:Regular Rate:Normal     Neuro/Psych PSYCHIATRIC DISORDERS Anxiety  Neuromuscular disease    GI/Hepatic Neg liver ROS, PUD, GERD  ,  Endo/Other  negative endocrine ROS  Renal/GU Renal disease     Musculoskeletal   Abdominal   Peds  Hematology   Anesthesia Other Findings   Reproductive/Obstetrics                             Anesthesia Physical Anesthesia Plan  ASA: II  Anesthesia Plan: MAC   Post-op Pain Management:    Induction: Intravenous  PONV Risk Score and Plan: Ondansetron, Dexamethasone and Midazolam  Airway Management Planned: Simple Face Mask  Additional Equipment:   Intra-op Plan:   Post-operative Plan:   Informed Consent: I have reviewed the patients History and Physical, chart, labs and discussed the procedure including the risks, benefits and alternatives for the proposed anesthesia with the patient or authorized representative who has indicated his/her understanding and acceptance.     Dental advisory given  Plan Discussed with: CRNA, Anesthesiologist and Surgeon  Anesthesia Plan Comments:       Anesthesia Quick Evaluation

## 2020-04-05 NOTE — Anesthesia Postprocedure Evaluation (Signed)
Anesthesia Post Note  Patient: Alicia Hahn  Procedure(s) Performed: REMOVAL PORT-A-CATH (Right Chest)     Patient location during evaluation: PACU Anesthesia Type: MAC Level of consciousness: awake Pain management: pain level controlled Vital Signs Assessment: post-procedure vital signs reviewed and stable Respiratory status: spontaneous breathing Cardiovascular status: stable Postop Assessment: no apparent nausea or vomiting Anesthetic complications: no   No complications documented.  Last Vitals:  Vitals:   04/05/20 1100 04/05/20 1115  BP: 117/71   Pulse: 68 68  Resp: 11 14  Temp:    SpO2: 100% 99%    Last Pain:  Vitals:   04/05/20 1115  TempSrc:   PainSc: 0-No pain                 Chanin Frumkin

## 2020-04-05 NOTE — Interval H&P Note (Signed)
History and Physical Interval Note:  04/05/2020 10:02 AM  Alicia Hahn  has presented today for surgery, with the diagnosis of PORT IN PLACE.  The various methods of treatment have been discussed with the patient and family. After consideration of risks, benefits and other options for treatment, the patient has consented to  Procedure(s): REMOVAL PORT-A-CATH (N/A) as a surgical intervention.  The patient's history has been reviewed, patient examined, no change in status, stable for surgery.  I have reviewed the patient's chart and labs.  Questions were answered to the patient's satisfaction.     [name]

## 2020-04-05 NOTE — Discharge Instructions (Signed)
GENERAL SURGERY: POST OP INSTRUCTIONS  ######################################################################  EAT Gradually transition to a high fiber diet with a fiber supplement over the next few weeks after discharge.  Start with a pureed / full liquid diet (see below)  WALK Walk an hour a day.  Control your pain to do that.    CONTROL PAIN Control pain so that you can walk, sleep, tolerate sneezing/coughing, go up/down stairs.  HAVE A BOWEL MOVEMENT DAILY Keep your bowels regular to avoid problems.  OK to try a laxative to override constipation.  OK to use an antidairrheal to slow down diarrhea.  Call if not better after 2 tries  CALL IF YOU HAVE PROBLEMS/CONCERNS Call if you are still struggling despite following these instructions. Call if you have concerns not answered by these instructions  ######################################################################    1. DIET: Follow a light bland diet & liquids the first 24 hours after arrival home, such as soup, liquids, starches, etc.  Be sure to drink plenty of fluids.  Quickly advance to a usual solid diet within a few days.  Avoid fast food or heavy meals as your are more likely to get nauseated or have irregular bowels.  A low-fat, high-fiber diet for the rest of your life is ideal.   2. Take your usually prescribed home medications unless otherwise directed. 3. PAIN CONTROL: a. Pain is best controlled by a usual combination of three different methods TOGETHER: i. Ice/Heat ii. Over the counter pain medication iii. Prescription pain medication b. Most patients will experience some swelling and bruising around the incisions.  Ice packs or heating pads (30-60 minutes up to 6 times a day) will help. Use ice for the first few days to help decrease swelling and bruising, then switch to heat to help relax tight/sore spots and speed recovery.  Some people prefer to use ice alone, heat alone, alternating between ice & heat.   Experiment to what works for you.  Swelling and bruising can take several weeks to resolve.   c. It is helpful to take an over-the-counter pain medication regularly for the first few weeks.  Choose one of the following that works best for you: i. Naproxen (Aleve, etc)  Two 220mg tabs twice a day ii. Ibuprofen (Advil, etc) Three 200mg tabs four times a day (every meal & bedtime) iii. Acetaminophen (Tylenol, etc) 500-650mg four times a day (every meal & bedtime) d. A  prescription for pain medication (such as oxycodone, hydrocodone, etc) should be given to you upon discharge.  Take your pain medication as prescribed.  i. If you are having problems/concerns with the prescription medicine (does not control pain, nausea, vomiting, rash, itching, etc), please call us [phone] to see if we need to switch you to a different pain medicine that will work better for you and/or control your side effect better. ii. If you need a refill on your pain medication, please contact your pharmacy.  They will contact our office to request authorization. Prescriptions will not be filled after 5 pm or on week-ends. 4. Avoid getting constipated.  Between the surgery and the pain medications, it is common to experience some constipation.  Increasing fluid intake and taking a fiber supplement (such as Metamucil, Citrucel, FiberCon, MiraLax, etc) 1-2 times a day regularly will usually help prevent this problem from occurring.  A mild laxative (prune juice, Milk of Magnesia, MiraLax, etc) should be taken according to package directions if there are no bowel movements after 48 hours.   5. Wash /   shower every day.  You may shower over the dressings as they are waterproof.  Continue to shower over incision(s) after the dressing is off. 6. Remove your waterproof bandages 5 days after surgery.  You may leave the incision open to air.  You may have skin tapes (Steri Strips) covering the incision(s).  Leave them on until one week, then  remove.  You may replace a dressing/Band-Aid to cover the incision for comfort if you wish.      7. ACTIVITIES as tolerated:   a. You may resume regular (light) daily activities beginning the next day--such as daily self-care, walking, climbing stairs--gradually increasing activities as tolerated.  If you can walk 30 minutes without difficulty, it is safe to try more intense activity such as jogging, treadmill, bicycling, low-impact aerobics, swimming, etc. b. Save the most intensive and strenuous activity for last such as sit-ups, heavy lifting, contact sports, etc  Refrain from any heavy lifting or straining until you are off narcotics for pain control.   c. DO NOT PUSH THROUGH PAIN.  Let pain be your guide: If it hurts to do something, don't do it.  Pain is your body warning you to avoid that activity for another week until the pain goes down. d. You may drive when you are no longer taking prescription pain medication, you can comfortably wear a seatbelt, and you can safely maneuver your car and apply brakes. e. Dennis Bast may have sexual intercourse when it is comfortable.  8. FOLLOW UP in our office a. Please call [facility] at [phone] to set up an appointment to see your surgeon in the office for a follow-up appointment approximately 2-3 weeks after your surgery. b. Make sure that you call for this appointment the day you arrive home to insure a convenient appointment time. 9. IF YOU HAVE DISABILITY OR FAMILY LEAVE FORMS, BRING THEM TO THE OFFICE FOR PROCESSING.  DO NOT GIVE THEM TO YOUR DOCTOR.   WHEN TO CALL us [phone]: 1. Poor pain control 2. Reactions / problems with new medications (rash/itching, nausea, etc)  3. Fever over 101.5 F (38.5 C) 4. Worsening swelling or bruising 5. Continued bleeding from incision. 6. Increased pain, redness, or drainage from the incision 7. Difficulty breathing / swallowing   The clinic staff is available to answer your questions during regular  business hours (8:30am-5pm).  Please don't hesitate to call and ask to speak to one of our nurses for clinical concerns.   If you have a medical emergency, go to the nearest emergency room or call 911.  A surgeon from [facility] is always on call at the [facility], [address], [place], [place], [place]  [id number] ? MAIN: [phone] ? TOLL FREE: [phone] ?  FAX [phone] [email or web address]    Post Anesthesia Home Care Instructions  Activity: Get plenty of rest for the remainder of the day. A responsible individual must stay with you for 24 hours following the procedure.  For the next 24 hours, DO NOT: -Drive a car -Paediatric nurse -Drink alcoholic beverages -Take any medication unless instructed by your physician -Make any legal decisions or sign important papers.  Meals: Start with liquid foods such as gelatin or soup. Progress to regular foods as tolerated. Avoid greasy, spicy, heavy foods. If nausea and/or vomiting occur, drink only clear liquids until the nausea and/or vomiting subsides. Call your physician if vomiting continues.  Special Instructions/Symptoms: Your throat may feel dry or  sore from the anesthesia or the breathing tube placed in your throat during surgery. If this causes discomfort, gargle with warm salt water. The discomfort should disappear within 24 hours.  If you had a scopolamine patch placed behind your ear for the management of post- operative nausea and/or vomiting:  1. The medication in the patch is effective for 72 hours, after which it should be removed.  Wrap patch in a tissue and discard in the trash. Wash hands thoroughly with soap and water. 2. You may remove the patch earlier than 72 hours if you experience unpleasant side effects which may include dry mouth, dizziness or visual disturbances. 3. Avoid touching the patch. Wash your hands with soap and water after  contact with the patch.

## 2020-04-05 NOTE — Op Note (Signed)

## 2020-04-05 NOTE — Transfer of Care (Signed)
Immediate Anesthesia Transfer of Care Note  Patient: Alicia Hahn  Procedure(s) Performed: REMOVAL PORT-A-CATH (Right Chest)  Patient Location: PACU  Anesthesia Type:MAC  Level of Consciousness: awake, alert  and oriented  Airway & Oxygen Therapy: Patient Spontanous Breathing and Patient connected to face mask oxygen  Post-op Assessment: Report given to RN and Post -op Vital signs reviewed and stable  Post vital signs: Reviewed and stable  Last Vitals:  Vitals Value Taken Time  BP 114/64 04/05/20 1045  Temp    Pulse 78 04/05/20 1046  Resp 15 04/05/20 1046  SpO2 100 % 04/05/20 1046  Vitals shown include unvalidated device data.  Last Pain:  Vitals:   04/05/20 0851  TempSrc: Axillary  PainSc: 0-No pain      Patients Stated Pain Goal: 3 (21/11/73 5670)  Complications: No complications documented.

## 2020-04-05 NOTE — H&P (Signed)
Alicia Hahn Location: [facility] Patient #: [id number] DOB: 1960/01/30 Married / Language: English / Race: White Female  History of Present Illness Patient words: Patient returns for follow-up of her stage I right breast cancer triple positive treated with breast conserving surgery followed by radiation therapy and chemotherapy. She has completed chemotherapy once her port out. She also has interest in breast reduction surgery more due to size and less from cosmetic change from treatment.     Malignant neoplasm of upper-outer quadrant of right breast, invasive ductal carcinoma, pT1cN0M0, stage IA, Triple Positive, Grade 3 -She was recently diagnosed in 12/2018. She has 26mm mass of right breast with invasive ductal carcinoma and components of DCIS. She underwent right lumpectomy and adjuvant radiation. -She completed adjuvant chemotherapy with Weekly Abraxane and trastuzumab-anns (Kanjinti) 02/23/19-05/11/19 and plans to complete maintenance Kanjinti today (01/25/20). -She started antiestrogen therapy with Letrozole in 08/2019. She is tolerating well with manageable body aches so far. -She is overall stable. 12/2019 Mammogram unremarkable and exam today benign with mild right breast edema. Labs reviewed and adequate to proceed with final Kanjinti today.              60 year old female status post malignant right lumpectomy with radiation treatment in 2021. This is the patient's first post lumpectomy mammogram.  EXAM: DIGITAL DIAGNOSTIC BILATERAL MAMMOGRAM WITH IMPLANTS, CAD AND TOMO  The patient has retropectoral implants. Standard and implant displaced views were performed.  COMPARISON: Previous exam(s).  ACR Breast Density Category b: There are scattered areas of fibroglandular density.  FINDINGS: Interval post lumpectomy changes noted in the upper-outer quadrant of the right breast. Diffuse right-sided skin and trabecular thickening is  consistent with radiation related changes. No new or suspicious mammographic findings in either breast.  Mammographic images were processed with CAD.  IMPRESSION: 1. No mammographic evidence of malignancy in either breast. 2. Interval right breast posttreatment changes.  RECOMMENDATION: Diagnostic mammogram is suggested in 1 year. (Code:[id number]-01Y)  I have discussed the findings and recommendations with the patient. If applicable, a reminder letter will be sent to the patient regarding the next appointment.  BI-RADS CATEGORY 2: Benign.   Electronically Signed By: Kristopher Oppenheim M.D. On: 12/26/2019 15:53.  The patient is a 60 year old female.   Allergies  )No Known Allergies  Allergies Reconciled  Medication History   traZODone HCl (50MG  Tablet, Oral) Active. Magnesium (125MG  Capsule, Oral) Active. Melatonin (3MG  Tablet, Oral) Active. Multiple Minerals-Vitamins (Oral) Active. Calcium (500MG  Tablet, Oral) Active. Vitamin D (Cholecalciferol) (25 MCG(1000 UT) Capsule, Oral) Active. Medications Reconciled     Physical Exam (  General Mental Status-Alert. General Appearance-Consistent with stated age. Hydration-Well hydrated. Voice-Normal.  Breast Note: Right breast scar noted. About 4 cm above the nipple is a small 5 mm sebaceous cyst in the skin. Posttreatment and radiation changes noted. Right-sided port in place and is clean. Left breast is normal. overall large pendulous breasts  Cardiovascular Cardiovascular examination reveals -normal heart sounds, regular rate and rhythm with no murmurs and normal pedal pulses bilaterally.  Neurologic Neurologic evaluation reveals -alert and oriented x 3 with no impairment of recent or remote memory. Mental Status-Normal.  Lymphatic Head & Neck  General Head & Neck Lymphatics: Bilateral - Description - Normal. Axillary  General Axillary Region: Bilateral - Description  - Normal. Tenderness - Non Tender.    Assessment & Plan ( BREAST CANCER, RIGHT (C50.911) Impression: Doing well Scheduled for port removal. Risks and benefits of port removal discussed with the patient to include  but not exclusive of bleeding, infection, embolus, catheter fragmentation, damage to blood vessels, damage to nerves, blood clot, anesthesia risks, and less likely death.  Current Plans I recommended surgery to remove the catheter. I explained the technique of removal with use of local anesthesia & possible need for more aggressive sedation/anesthesia for patient comfort.  Risks such as bleeding, infection, and other risks were discussed. Post-operative dressing/incision care was discussed. I noted a good likelihood this will help address the problem. We will work to minimize complications. Questions were answered. The patient expresses understanding & wishes to proceed with surgery.  Pt Education - CCS Free Text Education/Instructions: discussed with patient and provided information.

## 2020-04-06 ENCOUNTER — Other Ambulatory Visit: Payer: Self-pay | Admitting: Physician Assistant

## 2020-04-06 ENCOUNTER — Encounter (HOSPITAL_BASED_OUTPATIENT_CLINIC_OR_DEPARTMENT_OTHER): Payer: Self-pay | Admitting: Surgery

## 2020-04-06 DIAGNOSIS — Z0000 Encounter for general adult medical examination without abnormal findings: Secondary | ICD-10-CM

## 2020-04-06 DIAGNOSIS — R7401 Elevation of levels of liver transaminase levels: Secondary | ICD-10-CM

## 2020-04-06 DIAGNOSIS — E7849 Other hyperlipidemia: Secondary | ICD-10-CM

## 2020-04-12 ENCOUNTER — Ambulatory Visit (INDEPENDENT_AMBULATORY_CARE_PROVIDER_SITE_OTHER): Payer: 59 | Admitting: Family Medicine

## 2020-04-12 ENCOUNTER — Other Ambulatory Visit: Payer: 59

## 2020-04-12 ENCOUNTER — Encounter (INDEPENDENT_AMBULATORY_CARE_PROVIDER_SITE_OTHER): Payer: Self-pay | Admitting: Family Medicine

## 2020-04-12 ENCOUNTER — Other Ambulatory Visit: Payer: Self-pay

## 2020-04-12 VITALS — BP 135/86 | HR 75 | Temp 97.8°F | Ht 63.0 in | Wt 166.0 lb

## 2020-04-12 DIAGNOSIS — Z9189 Other specified personal risk factors, not elsewhere classified: Secondary | ICD-10-CM

## 2020-04-12 DIAGNOSIS — E673 Hypervitaminosis D: Secondary | ICD-10-CM | POA: Diagnosis not present

## 2020-04-12 DIAGNOSIS — Z853 Personal history of malignant neoplasm of breast: Secondary | ICD-10-CM | POA: Diagnosis not present

## 2020-04-12 DIAGNOSIS — R7401 Elevation of levels of liver transaminase levels: Secondary | ICD-10-CM

## 2020-04-12 DIAGNOSIS — R632 Polyphagia: Secondary | ICD-10-CM

## 2020-04-12 DIAGNOSIS — Z0000 Encounter for general adult medical examination without abnormal findings: Secondary | ICD-10-CM

## 2020-04-12 DIAGNOSIS — E785 Hyperlipidemia, unspecified: Secondary | ICD-10-CM

## 2020-04-12 DIAGNOSIS — Z6830 Body mass index (BMI) 30.0-30.9, adult: Secondary | ICD-10-CM

## 2020-04-12 DIAGNOSIS — E669 Obesity, unspecified: Secondary | ICD-10-CM

## 2020-04-13 LAB — HEPATIC FUNCTION PANEL
ALT: 29 IU/L (ref 0–32)
AST: 25 IU/L (ref 0–40)
Albumin: 4.4 g/dL (ref 3.8–4.8)
Alkaline Phosphatase: 93 IU/L (ref 44–121)
Bilirubin Total: 0.3 mg/dL (ref 0.0–1.2)
Bilirubin, Direct: <0.1 mg/dL (ref 0.00–0.40)
Total Protein: 6.6 g/dL (ref 6.0–8.5)

## 2020-04-13 LAB — LIPID PANEL
Chol/HDL Ratio: 2.9 ratio (ref 0.0–4.4)
Cholesterol, Total: 156 mg/dL (ref 100–199)
HDL: 53 mg/dL (ref >39)
LDL Chol Calc (NIH): 82 mg/dL (ref 0–99)
Triglycerides: 119 mg/dL (ref 0–149)
VLDL Cholesterol Cal: 21 mg/dL (ref 5–40)

## 2020-04-14 MED ORDER — PHENTERMINE HCL 8 MG PO TABS: 8.0000 mg | ORAL_TABLET | Freq: Every day | ORAL | 0 refills | Status: DC

## 2020-04-14 MED ORDER — TOPIRAMATE 25 MG PO TABS: 25.0000 mg | ORAL_TABLET | Freq: Two times a day (BID) | ORAL | 0 refills | Status: DC

## 2020-04-14 NOTE — Progress Notes (Signed)
Chief Complaint:   OBESITY Alicia Hahn is here to discuss her progress with her obesity treatment plan along with follow-up of her obesity related diagnoses.   Today's visit was #: 2 Starting weight: 171 lbs Starting date: 03/29/2020 Today's weight: 166 lbs Today's date: 04/12/2020 Total lbs lost to date: 5 lbs Body mass index is 29.41 kg/m.  Total weight loss percentage to date: -2.92%  Interim History:  Alicia Hahn says that water with Alicia Hahn has been adequate for her.  She is having 1-2 Coke Zero as well.  She is not drinking any wine.  She reports that she has been journaling.  She is getting in 65-90 grams of protein daily.  Denies polyphagia.  Craves sweets.  Current Meal Plan: the Category 1 Plan for 100% of the time.  Current Exercise Plan: None.  Assessment/Plan:   1. High vitamin D level Alicia Hahn's vitamin D level was 144.0 on 03/29/2020.  She is not currently taking a vitamin D supplement.  2. Elevated ALT measurement NAFLD is an umbrella term that encompasses a disease spectrum that includes steatosis (fat) without inflammation, steatohepatitis (NASH; fat + inflammation in a characteristic pattern), and cirrhosis. Bland steatosis is felt to be a benign condition, with extremely low to no risk of progression to cirrhosis, whereas NASH can progress to cirrhosis. The mainstay of treatment of NAFLD includes lifestyle modification to achieve weight loss, at least 7% of current body weight. Low carbohydrate diets can be beneficial in improving NAFLD liver histology. Additionally, exercise, even the absence of weight loss can have beneficial effects on the patient's metabolic profile and liver health.   3. History of breast cancer Alicia Hahn is taking Femara 2.5 mg daily.   4. Polyphagia Uncontrolled.  Current treatment: None. Polyphagia refers to excessive feelings of hunger. She will continue to focus on protein-rich, low simple carbohydrate foods. We reviewed the importance of  hydration, regular exercise for stress reduction, and restorative sleep.  Plan:  Start phentermine 8 mg daily and Topamax 25 mg twice daily.  - Start Phentermine HCl 8 MG TABS; Take 8 mg by mouth daily.  Dispense: 30 tablet; Refill: 0 - Start topiramate (TOPAMAX) 25 MG tablet; Take 1 tablet (25 mg total) by mouth 2 (two) times daily.  Dispense: 60 tablet; Refill: 0  The potential risks and benefits of (Qsymia) have been reviewed with the patient, and the patient wishes to move forward with this medication. We reviewed potential side effects including insomnia, dry mouth, increased heart rate and blood pressure, increased anxiety. We reviewed reducing caffeine consumption while taking phentermine, especially if the patient is experiencing side effects. Patient has been informed that topiramate may cause suicidal thoughts or actions, and that the patient must notify the provider immediately if they have changes in their mood or behaviors. Patient has been informed of the increased risk of kidney stones with topiramate.  Patient has been educated on the potential side effects, including drowsiness, paresthesias (tingling, numbness), cognitive effects on memory.  5. At risk for heart disease Due to Alicia Hahn's current state of health and medical condition(s), she is at a higher risk for heart disease.  This puts the patient at much greater risk to subsequently develop cardiopulmonary conditions that can significantly affect patient's quality of life in a negative manner.    At least 8 minutes were spent on counseling Alicia Hahn about these concerns today. Evidence-based interventions for health behavior change were utilized today including the discussion of self monitoring techniques, problem-solving barriers, and  SMART goal setting techniques.  Specifically, regarding patient's less desirable eating habits and patterns, we employed the technique of small changes when Alicia Hahn has not been able to fully commit to her  prudent nutritional plan.  6. Obesity, Current BMI 29.5  Course: Alicia Hahn is currently in the action stage of change. As such, her goal is to continue with weight loss efforts.   Nutrition goals: She has agreed to the Category 1 Plan.   Exercise goals: Increase activity.  Behavioral modification strategies: increasing lean protein intake, decreasing simple carbohydrates, increasing vegetables and increasing water intake.  Alicia Hahn has agreed to follow-up with our clinic in 2-3 weeks. She was informed of the importance of frequent follow-up visits to maximize her success with intensive lifestyle modifications for her multiple health conditions.   Objective:   Blood pressure 135/86, pulse 75, temperature 97.8 F (36.6 C), temperature source Oral, height 5\' 3"  (1.6 m), weight 166 lb (75.3 kg), SpO2 98 %. Body mass index is 29.41 kg/m.  General: Cooperative, alert, well developed, in no acute distress. HEENT: Conjunctivae and lids unremarkable. Cardiovascular: Regular rhythm.  Lungs: Normal work of breathing. Neurologic: No focal deficits.   Lab Results  Component Value Date   CREATININE 0.77 03/29/2020   BUN 18 03/29/2020   NA 138 03/29/2020   K 4.3 03/29/2020   CL 102 03/29/2020   CO2 22 03/29/2020   Lab Results  Component Value Date   ALT 29 04/12/2020   AST 25 04/12/2020   ALKPHOS 93 04/12/2020   BILITOT 0.3 04/12/2020   Lab Results  Component Value Date   HGBA1C 5.5 02/21/2020   HGBA1C 5.1 02/02/2019   Lab Results  Component Value Date   INSULIN 6.8 03/29/2020   Lab Results  Component Value Date   TSH 2.200 02/21/2020   Lab Results  Component Value Date   CHOL 156 04/12/2020   HDL 53 04/12/2020   LDLCALC 82 04/12/2020   TRIG 119 04/12/2020   CHOLHDL 2.9 04/12/2020   Lab Results  Component Value Date   WBC 4.0 02/21/2020   HGB 15.3 02/21/2020   HCT 45.7 03/29/2020   MCV 89 02/21/2020   PLT 191 02/21/2020   Lab Results  Component Value Date   IRON 93  03/29/2020   TIBC 352 03/29/2020   FERRITIN 163 (H) 03/29/2020   Attestation Statements:   Reviewed by clinician on day of visit: allergies, medications, problem list, medical history, surgical history, family history, social history, and previous encounter notes.  I, Water quality scientist, CMA, am acting as transcriptionist for Briscoe Deutscher, DO  I have reviewed the above documentation for accuracy and completeness, and I agree with the above. Briscoe Deutscher, DO

## 2020-04-30 ENCOUNTER — Telehealth (INDEPENDENT_AMBULATORY_CARE_PROVIDER_SITE_OTHER): Payer: Self-pay

## 2020-04-30 ENCOUNTER — Other Ambulatory Visit: Payer: Self-pay

## 2020-04-30 ENCOUNTER — Encounter (INDEPENDENT_AMBULATORY_CARE_PROVIDER_SITE_OTHER): Payer: Self-pay | Admitting: Family Medicine

## 2020-04-30 ENCOUNTER — Ambulatory Visit (INDEPENDENT_AMBULATORY_CARE_PROVIDER_SITE_OTHER): Payer: 59 | Admitting: Family Medicine

## 2020-04-30 VITALS — BP 98/69 | HR 89 | Temp 97.8°F | Ht 63.0 in | Wt 161.0 lb

## 2020-04-30 DIAGNOSIS — R7401 Elevation of levels of liver transaminase levels: Secondary | ICD-10-CM

## 2020-04-30 DIAGNOSIS — R632 Polyphagia: Secondary | ICD-10-CM

## 2020-04-30 DIAGNOSIS — E7849 Other hyperlipidemia: Secondary | ICD-10-CM | POA: Diagnosis not present

## 2020-04-30 DIAGNOSIS — E669 Obesity, unspecified: Secondary | ICD-10-CM | POA: Diagnosis not present

## 2020-04-30 DIAGNOSIS — Z6830 Body mass index (BMI) 30.0-30.9, adult: Secondary | ICD-10-CM

## 2020-04-30 DIAGNOSIS — Z9189 Other specified personal risk factors, not elsewhere classified: Secondary | ICD-10-CM

## 2020-04-30 MED ORDER — TOPIRAMATE 25 MG PO TABS: 25.0000 mg | ORAL_TABLET | Freq: Two times a day (BID) | ORAL | 0 refills | Status: DC

## 2020-04-30 NOTE — Telephone Encounter (Signed)
Colletta Maryland from [place] wants to confirm medication instructions for Topramate.

## 2020-05-01 MED ORDER — TOPIRAMATE 25 MG PO TABS: 25.0000 mg | ORAL_TABLET | Freq: Two times a day (BID) | ORAL | 0 refills | Status: DC

## 2020-05-01 NOTE — Progress Notes (Signed)
Chief Complaint:   OBESITY Alicia Hahn is here to discuss her progress with her obesity treatment plan along with follow-up of her obesity related diagnoses. Alicia Hahn is on the Category 1 Plan and states she is following her eating plan approximately 100% of the time. Alicia Hahn states she is not exercising regularly at this time.  Today's visit was #: 3 Starting weight: 171 lbs Starting date: 03/29/2020 Today's weight: 161 lbs Today's date: 04/30/2020 Total lbs lost to date: 10 lbs Total lbs lost since last in-office visit: 5 lbs  Interim History: Alicia Hahn is adhering to the plan very well.  She does tend to get a bit bored with the food.  She is enjoying the breakfast options.  She would like mayo on her sandwich.  Hunger is satisfied overall.  Subjective:   1. Polyphagia Sweets cravings well controlled.  She has been taking half of her 8 mg dose of phentermine and Topamax 25 mg in the morning.  No side effects from medications.  2. Other hyperlipidemia Last LDL elevated at 203.  She is on Crestor 20 mg daily.  Lab Results  Component Value Date   CHOL 156 04/12/2020   HDL 53 04/12/2020   LDLCALC 82 04/12/2020   TRIG 119 04/12/2020   CHOLHDL 2.9 04/12/2020   Lab Results  Component Value Date   ALT 29 04/12/2020   AST 25 04/12/2020   ALKPHOS 93 04/12/2020   BILITOT 0.3 04/12/2020   3. At risk for side effect of medication Alicia Hahn is at risk for side effect of medication due to increasing dose of phentermine and topiramate.  Assessment/Plan:   1. Polyphagia Will refill Topamax 25 mg twice daily today.  She will increase her phentermine to 8 mg in the morning and Topamax to 25 mg twice daily.  - Refill topiramate (TOPAMAX) 25 MG tablet; Take 1 tablet (25 mg total) by mouth 2 (two) times daily.  Dispense: 60 tablet; Refill: 0  2. Other hyperlipidemia Continue Crestor.  3. At risk for side effect of medication Alicia Hahn was given approximately 15 minutes of drug side effect counseling  today.  We discussed side effect possibility and risk versus benefits. Alicia Hahn agreed to the medication and will contact this office if these side effects are intolerable.  Repetitive spaced learning was employed today to elicit superior memory formation and behavioral change.  4. Obesity, Current BMI 82  Alicia Hahn is currently in the action stage of change. As such, her goal is to continue with weight loss efforts. She has agreed to the Category 1 Plan and keeping a food journal and adhering to recommended goals of 300-400 calories and 35 grams of protein.   She may have 1 tsp of mayo on her sandwich.  Handouts provided:  Recipes, Protein Equivalents.  Exercise goals: No exercise has been prescribed at this time.  Behavioral modification strategies: meal planning and cooking strategies.  Alicia Hahn has agreed to follow-up with our clinic in 3 weeks with Dr. Juleen China and in 5-6 weeks with Jake Bathe, [place].   Objective:   Blood pressure 98/69, pulse 89, temperature 97.8 F (36.6 C), height 5\' 3"  (1.6 m), weight 161 lb (73 kg), SpO2 99 %. Body mass index is 28.52 kg/m.  General: Cooperative, alert, well developed, in no acute distress. HEENT: Conjunctivae and lids unremarkable. Cardiovascular: Regular rhythm.  Lungs: Normal work of breathing. Neurologic: No focal deficits.   Lab Results  Component Value Date   CREATININE 0.77 03/29/2020   BUN 18 03/29/2020  NA 138 03/29/2020   K 4.3 03/29/2020   CL 102 03/29/2020   CO2 22 03/29/2020   Lab Results  Component Value Date   ALT 29 04/12/2020   AST 25 04/12/2020   ALKPHOS 93 04/12/2020   BILITOT 0.3 04/12/2020   Lab Results  Component Value Date   HGBA1C 5.5 02/21/2020   HGBA1C 5.1 02/02/2019   Lab Results  Component Value Date   INSULIN 6.8 03/29/2020   Lab Results  Component Value Date   TSH 2.200 02/21/2020   Lab Results  Component Value Date   CHOL 156 04/12/2020   HDL 53 04/12/2020   LDLCALC 82 04/12/2020   TRIG  119 04/12/2020   CHOLHDL 2.9 04/12/2020   Lab Results  Component Value Date   WBC 4.0 02/21/2020   HGB 15.3 02/21/2020   HCT 45.7 03/29/2020   MCV 89 02/21/2020   PLT 191 02/21/2020   Lab Results  Component Value Date   IRON 93 03/29/2020   TIBC 352 03/29/2020   FERRITIN 163 (H) 03/29/2020   Attestation Statements:   Reviewed by clinician on day of visit: allergies, medications, problem list, medical history, surgical history, family history, social history, and previous encounter notes.  I, Water quality scientist, CMA, am acting as Location manager for [employer], [place].  I have reviewed the above documentation for accuracy and completeness, and I agree with the above. -  Georgianne Fick, FNP

## 2020-05-01 NOTE — Telephone Encounter (Signed)
Please advise 

## 2020-05-02 ENCOUNTER — Encounter (INDEPENDENT_AMBULATORY_CARE_PROVIDER_SITE_OTHER): Payer: Self-pay | Admitting: Family Medicine

## 2020-05-02 DIAGNOSIS — Z6830 Body mass index (BMI) 30.0-30.9, adult: Secondary | ICD-10-CM | POA: Insufficient documentation

## 2020-05-02 DIAGNOSIS — E669 Obesity, unspecified: Secondary | ICD-10-CM | POA: Insufficient documentation

## 2020-05-03 NOTE — Telephone Encounter (Signed)
Call to pharmacy, they have what they need for clarification.

## 2020-05-03 NOTE — Telephone Encounter (Signed)
Please call the pharmacy and tell them she is to take topiramate 25 mg PO BID. I re-sent the RX a few days ago so I don't know why they still need clarification. Thanks

## 2020-05-08 ENCOUNTER — Other Ambulatory Visit: Payer: Self-pay | Admitting: Physician Assistant

## 2020-05-08 DIAGNOSIS — E785 Hyperlipidemia, unspecified: Secondary | ICD-10-CM

## 2020-05-08 MED ORDER — ROSUVASTATIN CALCIUM 20 MG PO TABS: 20.0000 mg | ORAL_TABLET | Freq: Every day | ORAL | 0 refills | Status: DC

## 2020-05-09 ENCOUNTER — Other Ambulatory Visit: Payer: Self-pay | Admitting: Hematology

## 2020-05-09 DIAGNOSIS — Z170 Estrogen receptor positive status [ER+]: Secondary | ICD-10-CM

## 2020-05-09 DIAGNOSIS — C50411 Malignant neoplasm of upper-outer quadrant of right female breast: Secondary | ICD-10-CM

## 2020-05-09 DIAGNOSIS — R232 Flushing: Secondary | ICD-10-CM

## 2020-05-09 DIAGNOSIS — T451X5A Adverse effect of antineoplastic and immunosuppressive drugs, initial encounter: Secondary | ICD-10-CM

## 2020-05-21 NOTE — Progress Notes (Signed)
[facility]   [phone] [phone]   Clinic Follow up Note   Patient Care Team: Lorrene Reid, PA-C as PCP - General Bensimhon, Shaune Pascal, MD as PCP - Advanced Heart Failure (Cardiology) Mcarthur Rossetti, MD as Consulting Physician (Orthopedic Surgery) Molli Posey, MD as Consulting Physician (Obstetrics and Gynecology) Erroll Luna, MD as Consulting Physician (General Surgery) Truitt Merle, MD as Consulting Physician (Hematology) Kyung Rudd, MD as Consulting Physician (Radiation Oncology) Mauro Kaufmann, RN as Oncology Nurse Navigator Rockwell Germany, RN as Oncology Nurse Navigator Alla Feeling, NP as Nurse Practitioner (Nurse Practitioner)  Date of Service:  05/25/2020  CHIEF COMPLAINT: Follow-up right breast cancer  SUMMARY OF ONCOLOGIC HISTORY: Oncology History Overview Note  Cancer Staging Malignant neoplasm of upper-outer quadrant of right breast in female, estrogen receptor positive ([place]) Staging form: Breast, AJCC 8th Edition - Clinical stage from 12/29/2018: Stage IA (cT1b, cN0, cM0, G2, ER+, PR+, HER2+) - Unsigned - Pathologic stage from 01/27/2019: Stage IA (pT1c, pN0, cM0, G3, ER+, PR+, HER2+) - Signed by Truitt Merle, MD on 02/06/2019 Cancer Staging No matching staging information was found for the patient.    Malignant neoplasm of upper-outer quadrant of right breast in female, estrogen receptor positive ([place])  12/22/2018 Mammogram    Diagnostic Mammogram 12/22/18  IMPRESSION: 1. 9 mm mass, with associated calcifications and architectural distortion, in the 11 o'clock position of the right breast, highly suspicious for breast carcinoma. 2. Questionable mass noted in the inferior aspect of the right breast remains equivocal on diagnostic imaging may reflect focal fibroglandular tissue. There is no sonographic correlate.   12/23/2018 Initial Biopsy   Diagnosis 12/23/18 Breast, right, needle core biopsy, 11 o'clock  position - INVASIVE DUCTAL CARCINOMA, SEE COMMENT. - DUCTAL CARCINOMA IN SITU WITH NECROSIS.   12/28/2018 Initial Diagnosis   Malignant neoplasm of upper-outer quadrant of right breast in female, estrogen receptor positive ([place])   01/13/2019 Imaging   MRI Breast 01/13/19  IMPRESSION: 1. Biopsy proven malignancy in the upper-outer right breast. No additional suspicious findings on the right. 2. No MRI evidence of malignancy on the left. 3. No suspicious lymphadenopathy.   01/27/2019 Surgery   RIGHT BREAST LUMPECTOMY WITH RADIOACTIVE SEED AND SENTINEL LYMPH NODE MAPPING and PAC placement by Dr Brantley Stage  01/27/19   01/27/2019 Pathology Results   FINAL MICROSCOPIC DIAGNOSIS: 01/27/19  A. BREAST, RIGHT, LUMPECTOMY:  - Invasive ductal carcinoma, grade 3, spanning 1.3 cm.  - High grade ductal carcinoma in situ with necrosis.  - Biopsy site.  - Final resection margins are negative for carcinoma.  - See oncology table.   B. BREAST, RIGHT ADDITIONAL SUPERIOR MARGIN, EXCISION:  - Fibrocystic change and adenosis.  - No malignancy identified.   C. LYMPH NODE, RIGHT #1, SENTINEL, BIOPSY:  - One of one lymph nodes negative for carcinoma (0/1).   D. LYMPH NODE, RIGHT, SENTINEL, BIOPSY:  - One of one lymph nodes negative for carcinoma (0/1).   E. LYMPH NODE, RIGHT, SENTINEL, BIOPSY:  - One of one lymph nodes negative for carcinoma (0/1).   F. LYMPH NODE, RIGHT, SENTINEL, BIOPSY:  - One of one lymph nodes negative for carcinoma (0/1).       01/27/2019 Cancer Staging   Staging form: Breast, AJCC 8th Edition - Pathologic stage from 01/27/2019: Stage IA (pT1c, pN0, cM0, G3, ER+, PR+, HER2+) - Signed by Truitt Merle, MD on 02/06/2019   02/08/2019 Imaging   CT AP W Contrast 02/08/19  IMPRESSION: 1. Wall  thickening of the gastric antrum may represent gastritis.   Aortic Atherosclerosis (ICD10-I70.0).   02/23/2019 - 01/25/2020 Chemotherapy   Adjuvant weekly Abraxane with trastuzumab-anns (Kanjinti)  q3weeks starting 02/23/19. Completed 12 week of Abraxane on 05/11/19. Maintenance trastuzumab-anns (Kanjinti) q3weeks start 05/18/19. Plan to complete 01/25/20.   06/13/2019 - 08/01/2019 Radiation Therapy   Radiation with Dr Mitzi Hansen starting 06/13/19-08/01/19   08/2019 -  Anti-estrogen oral therapy   Letrozole, beginning 08/25/2019    09/28/2019 Survivorship   SCP delivered by Santiago Glad, NP       CURRENT THERAPY:  Letrozole once daily starting 08/25/19  INTERVAL HISTORY:  Alicia Hahn is here for a follow up of right breast cancer. She was last seen by me 4 months ago. She presents to the clinic alone. She notes she is doing well. She denies any new major changes except a right breast lump which is tender to the touch. She notes she noticed this 6-8 weeks ago. She was seen by Dr Davina Poke at the time who though this to be a cyst. She denies any bone pain or new concerning pain. She denies residual side effects from chemo. She notes she has hot flashes at night. Effexor does help this. She is still on Letrozole and tolerating well. She notes she has held Vit D 1200 units level given it was high recently. She plans to undergo Monalisa procedure soon given her vaginal tightness and pain. She plans to see Dr Ulice Bold about replacing her breast implants and breast lift.    REVIEW OF SYSTEMS:   Constitutional: Denies fevers, chills or abnormal weight loss (+) Hot flashes  Eyes: Denies blurriness of vision Ears, nose, mouth, throat, and face: Denies mucositis or sore throat Respiratory: Denies cough, dyspnea or wheezes Cardiovascular: Denies palpitation, chest discomfort or lower extremity swelling Gastrointestinal:  Denies nausea, heartburn or change in bowel habits Skin: Denies abnormal skin rashes MSK: (+) LE bone aches  Lymphatics: Denies new lymphadenopathy or easy bruising Neurological:Denies numbness, tingling or new weaknesses Behavioral/Psych: Mood is stable, no new changes  All other  systems were reviewed with the patient and are negative.  MEDICAL HISTORY:  Past Medical History:  Diagnosis Date  . Acid reflux    occasionally, and takes pepcid OTC  . Anxiety   . Breast cancer (HCC)   . Cancer [facility])    breast cancer  . Gastric ulcer   . GERD (gastroesophageal reflux disease)   . Hyperlipidemia   . Joint pain   . Kidney stones   . Lower extremity edema   . Personal history of chemotherapy   . Personal history of radiation therapy   . Stomach ulcer   . Vitamin D deficiency     SURGICAL HISTORY: Past Surgical History:  Procedure Laterality Date  . AUGMENTATION MAMMAPLASTY    . BREAST LUMPECTOMY    . BREAST LUMPECTOMY WITH RADIOACTIVE SEED AND SENTINEL LYMPH NODE BIOPSY Right 01/27/2019   Procedure: RIGHT BREAST LUMPECTOMY WITH RADIOACTIVE SEED AND SENTINEL LYMPH NODE MAPPING;  Surgeon: Harriette Bouillon, MD;  Location: [facility];  Service: General;  Laterality: Right;  . COSMETIC SURGERY Bilateral 2000   breast aug/ lipo  . PORT-A-CATH REMOVAL Right 04/05/2020   Procedure: REMOVAL PORT-A-CATH;  Surgeon: Harriette Bouillon, MD;  Location: [facility];  Service: General;  Laterality: Right;  . PORTACATH PLACEMENT Right 01/27/2019   Procedure: INSERTION PORT-A-CATH WITH ULTRASOUND;  Surgeon: Harriette Bouillon, MD;  Location: [facility];  Service: General;  Laterality: Right;    I have reviewed the social history and family history with the patient and they are unchanged from previous note.  ALLERGIES:  has No Known Allergies.  MEDICATIONS:  Current Outpatient Medications  Medication Sig Dispense Refill  . CALCIUM PO Take 1,000 mg by mouth daily.    Marland Kitchen letrozole (FEMARA) 2.5 MG tablet Take 1 tablet (2.5 mg total) by mouth daily. 90 tablet 3  . Magnesium 125 MG CAPS Take 1 capsule by mouth daily.     . Multiple Vitamins-Minerals (MULTIVITAMIN WITH MINERALS) tablet Take 1 tablet by mouth daily.    . Phentermine HCl 8 MG TABS  Take 8 mg by mouth daily. 30 tablet 0  . rosuvastatin (CRESTOR) 20 MG tablet Take 1 tablet (20 mg total) by mouth daily. 90 tablet 0  . topiramate (TOPAMAX) 25 MG tablet Take 1 tablet (25 mg total) by mouth 2 (two) times daily. 60 tablet 0  . traZODone (DESYREL) 50 MG tablet Take 1 tablet (50 mg total) by mouth at bedtime as needed for sleep. 90 tablet 1  . venlafaxine XR (EFFEXOR-XR) 37.5 MG 24 hr capsule TAKE 2 CAPSULES BY MOUTH  DAILY WITH BREAKFAST 60 capsule 11   No current facility-administered medications for this visit.    PHYSICAL EXAMINATION: ECOG PERFORMANCE STATUS: 0 - Asymptomatic  Vitals:   05/25/20 1508  BP: (!) 132/92  Pulse: 89  Resp: 18  Temp: (!) 97.4 F (36.3 C)  SpO2: 98%   Filed Weights   05/25/20 1508  Weight: 159 lb 1.6 oz (72.2 kg)    GENERAL:alert, no distress and comfortable SKIN: skin color, texture, turgor are normal, no rashes or significant lesions EYES: normal, Conjunctiva are pink and non-injected, sclera clear  NECK: supple, thyroid normal size, non-tender, without nodularity LYMPH:  no palpable lymphadenopathy in the cervical, axillary  LUNGS: clear to auscultation and percussion with normal breathing effort HEART: regular rate & rhythm and no murmurs and no lower extremity edema ABDOMEN:abdomen soft, non-tender and normal bowel sounds Musculoskeletal:no cyanosis of digits and no clubbing  NEURO: alert & oriented x 3 with fluent speech, no focal motor/sensory deficits BREAST: S/p b/l reconstruction with breast implants. (+)S/p right lumpectomy: surgical incision healed well (+) Right breast nodule, likely cyst (+) Mild right breast lymphedema. No palpable mass, nodules or adenopathy bilaterally. Breast exam benign.   LABORATORY DATA:  I have reviewed the data as listed CBC Latest Ref Rng & Units 05/25/2020 03/29/2020 02/21/2020  WBC 4.0 - 10.5 K/uL 4.0 - 4.0  Hemoglobin 12.0 - 15.0 g/dL 14.7 - 15.3  Hematocrit 36.0 - 46.0 % 42.5 45.7 44.0   Platelets 150 - 400 K/uL 188 - 191     CMP Latest Ref Rng & Units 04/12/2020 03/29/2020 02/21/2020  Glucose 65 - 99 mg/dL - 89 91  BUN 8 - 27 mg/dL - 18 17  Creatinine 0.57 - 1.00 mg/dL - 0.77 0.92  Sodium 134 - 144 mmol/L - 138 141  Potassium 3.5 - 5.2 mmol/L - 4.3 4.1  Chloride 96 - 106 mmol/L - 102 103  CO2 20 - 29 mmol/L - 22 24  Calcium 8.7 - 10.3 mg/dL - 9.9 9.7  Total Protein 6.0 - 8.5 g/dL 6.6 7.2 6.5  Total Bilirubin 0.0 - 1.2 mg/dL 0.3 0.2 0.4  Alkaline Phos 44 - 121 IU/L 93 99 88  AST 0 - 40 IU/L $Remov'25 28 26  'aScQvc$ ALT 0 - 32 IU/L 29 40(H) 35(H)  RADIOGRAPHIC STUDIES: I have personally reviewed the radiological images as listed and agreed with the findings in the report. No results found.   ASSESSMENT & PLAN:  Alicia Hahn is a 60 y.o. female with    1.Malignant neoplasm of upper-outer quadrant of right breast,invasive ductal carcinoma,pT1cN0M0,stageIA,Triple Positive,Grade3 -She was diagnosed in 12/2018 with 69mm mass of right breast with invasive ductal carcinoma and components of DCIS.She underwent right lumpectomyand adjuvant radiation. -She completedadjuvant chemotherapy with Weekly Abraxane andtrastuzumab-anns (Kanjinti)02/23/19-05/11/19 and maintenanceKanjinti (01/25/20). -She started antiestrogen therapy with Letrozole in 08/2019.She is tolerating wellwith manageable body aches so far.  -Pt notes recent right breast lump 6-8 weeks ago. Her 12/2019 Mammogram unremarkable. Her exam today right breast superficial nodule is likely a cyst and mild right breast lymphedema. Labs reviewed, CBC and CMP WNL. There is no clinical concern for cancer recurrence.  -I recommend breast massaging for her lymphedema.  -Continue surveillance. Next mammogram in 12/2020. Next MRI in 06/2020.  -Continue Letrozole  -F/u in 6 months with NP Lacie   2. Hot Flashes  -Secondary to Letrozole  -On Effexor 37.5-$RemoveBeforeDE'75mg'oLuzJlgBujFXzav$  this is manageable.   3. Osteopenia  -Her 07/2019 DEXA  shows osteopenia with lowest T-score -1.3 at right hip.  -I discussed Letrozole can decrease her bone density. Will repeat DEXA in 07/2021.  4. Estrogen replacement andpostmenopausalvaginal atrophy -Started after 2nd Mirena was taken out. Her Gyn put her on a third Mirena and bleeding stopped.  Vernia Buff estrogen replacement for several years for hot flashes. By her GYN. -Iencouragedherto use lubricant with intercourse.  -She plans to proceed with Monalisa procedurewith her Gyn in 2022.   5. Genetics:Pt notes [facility] was negative   PLAN: -Continue Letrozole  -MRI breast in 06/2020.  -Lab and F/u in 6 months with NP Lacie    No problem-specific Assessment & Plan notes found for this encounter.   Orders Placed This Encounter  Procedures  . MR BREAST BILATERAL W WO CONTRAST INC CAD    Standing Status:   Future    Standing Expiration Date:   05/25/2021    Order Specific Question:   If indicated for the ordered procedure, I authorize the administration of contrast media per Radiology protocol    Answer:   Yes    Order Specific Question:   What is the patient's sedation requirement?    Answer:   No Sedation    Order Specific Question:   Does the patient have a pacemaker or implanted devices?    Answer:   No    Order Specific Question:   Radiology Contrast Protocol - do NOT remove file path    Answer:   [email or web address]    Order Specific Question:   Preferred imaging location?    Answer:   [facility] (table limit-550lbs)   All questions were answered. The patient knows to call the clinic with any problems, questions or concerns. No barriers to learning was detected. The total time spent in the appointment was 30 minutes.     Truitt Merle, MD 05/25/2020   I, Joslyn Devon, am acting as scribe for Truitt Merle, MD.   I have reviewed the above documentation for accuracy and  completeness, and I agree with the above.

## 2020-05-24 ENCOUNTER — Encounter (INDEPENDENT_AMBULATORY_CARE_PROVIDER_SITE_OTHER): Payer: Self-pay | Admitting: Family Medicine

## 2020-05-24 ENCOUNTER — Ambulatory Visit (INDEPENDENT_AMBULATORY_CARE_PROVIDER_SITE_OTHER): Payer: 59 | Admitting: Family Medicine

## 2020-05-24 ENCOUNTER — Other Ambulatory Visit: Payer: Self-pay

## 2020-05-24 VITALS — BP 110/76 | HR 90 | Temp 98.4°F | Ht 63.0 in | Wt 156.0 lb

## 2020-05-24 DIAGNOSIS — Z853 Personal history of malignant neoplasm of breast: Secondary | ICD-10-CM

## 2020-05-24 DIAGNOSIS — Z6830 Body mass index (BMI) 30.0-30.9, adult: Secondary | ICD-10-CM

## 2020-05-24 DIAGNOSIS — C50411 Malignant neoplasm of upper-outer quadrant of right female breast: Secondary | ICD-10-CM

## 2020-05-24 DIAGNOSIS — Z9189 Other specified personal risk factors, not elsewhere classified: Secondary | ICD-10-CM | POA: Diagnosis not present

## 2020-05-24 DIAGNOSIS — R632 Polyphagia: Secondary | ICD-10-CM | POA: Diagnosis not present

## 2020-05-24 DIAGNOSIS — E669 Obesity, unspecified: Secondary | ICD-10-CM

## 2020-05-24 DIAGNOSIS — F411 Generalized anxiety disorder: Secondary | ICD-10-CM

## 2020-05-24 DIAGNOSIS — Z170 Estrogen receptor positive status [ER+]: Secondary | ICD-10-CM

## 2020-05-24 MED ORDER — PHENTERMINE HCL 8 MG PO TABS: 8.0000 mg | ORAL_TABLET | Freq: Every day | ORAL | 0 refills | Status: DC

## 2020-05-25 ENCOUNTER — Encounter: Payer: Self-pay | Admitting: Hematology

## 2020-05-25 ENCOUNTER — Inpatient Hospital Stay: Payer: 59 | Attending: Hematology | Admitting: Hematology

## 2020-05-25 ENCOUNTER — Inpatient Hospital Stay: Payer: 59

## 2020-05-25 VITALS — BP 132/92 | HR 89 | Temp 97.4°F | Resp 18 | Ht 63.0 in | Wt 159.1 lb

## 2020-05-25 DIAGNOSIS — Z170 Estrogen receptor positive status [ER+]: Secondary | ICD-10-CM | POA: Diagnosis not present

## 2020-05-25 DIAGNOSIS — C50411 Malignant neoplasm of upper-outer quadrant of right female breast: Secondary | ICD-10-CM | POA: Diagnosis not present

## 2020-05-25 DIAGNOSIS — Z923 Personal history of irradiation: Secondary | ICD-10-CM | POA: Insufficient documentation

## 2020-05-25 DIAGNOSIS — M85851 Other specified disorders of bone density and structure, right thigh: Secondary | ICD-10-CM | POA: Insufficient documentation

## 2020-05-25 DIAGNOSIS — Z79899 Other long term (current) drug therapy: Secondary | ICD-10-CM | POA: Diagnosis not present

## 2020-05-25 DIAGNOSIS — Z9221 Personal history of antineoplastic chemotherapy: Secondary | ICD-10-CM | POA: Diagnosis not present

## 2020-05-25 DIAGNOSIS — Z79811 Long term (current) use of aromatase inhibitors: Secondary | ICD-10-CM | POA: Diagnosis not present

## 2020-05-25 DIAGNOSIS — N951 Menopausal and female climacteric states: Secondary | ICD-10-CM | POA: Insufficient documentation

## 2020-05-25 LAB — CBC WITH DIFFERENTIAL (CANCER CENTER ONLY)
Abs Immature Granulocytes: 0.01 10*3/uL (ref 0.00–0.07)
Basophils Absolute: 0 10*3/uL (ref 0.0–0.1)
Basophils Relative: 1 %
Eosinophils Absolute: 0.1 10*3/uL (ref 0.0–0.5)
Eosinophils Relative: 2 %
HCT: 42.5 % (ref 36.0–46.0)
Hemoglobin: 14.7 g/dL (ref 12.0–15.0)
Immature Granulocytes: 0 %
Lymphocytes Relative: 27 %
Lymphs Abs: 1.1 10*3/uL (ref 0.7–4.0)
MCH: 30.9 pg (ref 26.0–34.0)
MCHC: 34.6 g/dL (ref 30.0–36.0)
MCV: 89.3 fL (ref 80.0–100.0)
Monocytes Absolute: 0.5 10*3/uL (ref 0.1–1.0)
Monocytes Relative: 12 %
Neutro Abs: 2.3 10*3/uL (ref 1.7–7.7)
Neutrophils Relative %: 58 %
Platelet Count: 188 10*3/uL (ref 150–400)
RBC: 4.76 MIL/uL (ref 3.87–5.11)
RDW: 12 % (ref 11.5–15.5)
WBC Count: 4 10*3/uL (ref 4.0–10.5)
nRBC: 0 % (ref 0.0–0.2)

## 2020-05-25 LAB — CMP (CANCER CENTER ONLY)
ALT: 25 U/L (ref 0–44)
AST: 23 U/L (ref 15–41)
Albumin: 3.8 g/dL (ref 3.5–5.0)
Alkaline Phosphatase: 97 U/L (ref 38–126)
Anion gap: 11 (ref 5–15)
BUN: 17 mg/dL (ref 8–23)
CO2: 22 mmol/L (ref 22–32)
Calcium: 9.4 mg/dL (ref 8.9–10.3)
Chloride: 108 mmol/L (ref 98–111)
Creatinine: 0.91 mg/dL (ref 0.44–1.00)
GFR, Estimated: >60 mL/min (ref >60)
Glucose, Bld: 100 mg/dL — ABNORMAL HIGH (ref 70–99)
Potassium: 4.2 mmol/L (ref 3.5–5.1)
Sodium: 141 mmol/L (ref 135–145)
Total Bilirubin: 0.3 mg/dL (ref 0.3–1.2)
Total Protein: 6.9 g/dL (ref 6.5–8.1)

## 2020-05-27 ENCOUNTER — Encounter: Payer: Self-pay | Admitting: Hematology

## 2020-05-29 ENCOUNTER — Telehealth: Payer: Self-pay | Admitting: Hematology

## 2020-05-29 NOTE — Telephone Encounter (Signed)
Left message with follow-up appointment per 5/6 los. Gave option to call back to reschedule if needed. 

## 2020-05-31 NOTE — Progress Notes (Signed)
Chief Complaint:   OBESITY Alicia Hahn is here to discuss her progress with her obesity treatment plan along with follow-up of her obesity related diagnoses.   Today's visit was #: 4 Starting weight: 171 lbs Starting date: 03/29/2020 Today's weight: 156 lbs Today's date: 05/24/2020 Weight change since last visit: 5 lbs Total lbs lost to date: 15 lbs Body mass index is 27.63 kg/m.  Total weight loss percentage to date: -8.77%  Interim History:  Alicia Hahn's goal is to lose 15-20 more pounds.  She says that her sweets cravings have resolved.  She is tolerating her medications.    Current Meal Plan: the Category 1 Plan and keeping a food journal and adhering to recommended goals of 300-400 calories and 35 grams of protein for 95% of the time.  Current Exercise Plan: None. Current Anti-Obesity Medications: phentermine 8 mg daily. Side effects: None.  Assessment/Plan:    1. Polyphagia Controlled. Current treatment: polyphagia 8 mg daily. Polyphagia refers to excessive feelings of hunger. She will continue to focus on protein-rich, low simple carbohydrate foods. We reviewed the importance of hydration, regular exercise for stress reduction, and restorative sleep.  I have consulted the [place] Controlled Substances Registry for this patient, and feel the risk/benefit ratio today is favorable for proceeding with this prescription for a controlled substance. The patient understands monitoring parameters and red flags.   - Refill Phentermine HCl 8 MG TABS; Take 8 mg by mouth daily.  Dispense: 30 tablet; Refill: 0  2. History of breast cancer Taking Femara 2.5 mg daily. We will continue to monitor symptoms as they relate to her weight loss journey.  3. GAD (generalized anxiety disorder) Behavior modification techniques were discussed today to help Alicia Hahn deal with her anxiety.  4. At risk for heart disease Due to Alicia Hahn's current state of health and medical condition(s), she is at a higher risk for  heart disease.  This puts the patient at much greater risk to subsequently develop cardiopulmonary conditions that can significantly affect patient's quality of life in a negative manner.    At least 8 minutes were spent on counseling [name] about these concerns today. Evidence-based interventions for health behavior change were utilized today including the discussion of self monitoring techniques, problem-solving barriers, and SMART goal setting techniques.  Specifically, regarding patient's less desirable eating habits and patterns, we employed the technique of small changes when [name] has not been able to fully commit to her prudent nutritional plan.  5. Obesity, Current BMI 27.6  Course: Alicia Hahn is currently in the action stage of change. As such, her goal is to continue with weight loss efforts.   Nutrition goals: She has agreed to the Category 1 Plan and keeping a food journal and adhering to recommended goals of 300-400 calories and 35 grams of protein.   Exercise goals: Treadmill for 1 mile 4 times per week.  Behavioral modification strategies: increasing lean protein intake, decreasing simple carbohydrates, increasing vegetables and increasing water intake.  Alicia Hahn has agreed to follow-up with our clinic in 3 weeks. She was informed of the importance of frequent follow-up visits to maximize her success with intensive lifestyle modifications for her multiple health conditions.   Objective:   Blood pressure 110/76, pulse 90, temperature 98.4 F (36.9 C), temperature source Oral, height 5\' 3"  (1.6 m), weight 156 lb (70.8 kg), SpO2 99 %. Body mass index is 27.63 kg/m.  General: Cooperative, alert, well developed, in no acute distress. HEENT: Conjunctivae and lids unremarkable. Cardiovascular: Regular rhythm.  Lungs: Normal work of breathing. Neurologic: No focal deficits.   Lab Results  Component Value Date   CREATININE 0.91 05/25/2020   BUN 17 05/25/2020   NA 141 05/25/2020   K  4.2 05/25/2020   CL 108 05/25/2020   CO2 22 05/25/2020   Lab Results  Component Value Date   ALT 25 05/25/2020   AST 23 05/25/2020   ALKPHOS 97 05/25/2020   BILITOT 0.3 05/25/2020   Lab Results  Component Value Date   HGBA1C 5.5 02/21/2020   HGBA1C 5.1 02/02/2019   Lab Results  Component Value Date   INSULIN 6.8 03/29/2020   Lab Results  Component Value Date   TSH 2.200 02/21/2020   Lab Results  Component Value Date   CHOL 156 04/12/2020   HDL 53 04/12/2020   LDLCALC 82 04/12/2020   TRIG 119 04/12/2020   CHOLHDL 2.9 04/12/2020   Lab Results  Component Value Date   WBC 4.0 05/25/2020   HGB 14.7 05/25/2020   HCT 42.5 05/25/2020   MCV 89.3 05/25/2020   PLT 188 05/25/2020   Lab Results  Component Value Date   IRON 93 03/29/2020   TIBC 352 03/29/2020   FERRITIN 163 (H) 03/29/2020   Attestation Statements:   Reviewed by clinician on day of visit: allergies, medications, problem list, medical history, surgical history, family history, social history, and previous encounter notes.  I, Water quality scientist, CMA, am acting as transcriptionist for Briscoe Deutscher, DO  I have reviewed the above documentation for accuracy and completeness, and I agree with the above. Briscoe Deutscher, DO

## 2020-06-04 LAB — HM PAP SMEAR: HM Pap smear: NEGATIVE

## 2020-06-09 ENCOUNTER — Ambulatory Visit
Admission: RE | Admit: 2020-06-09 | Discharge: 2020-06-09 | Disposition: A | Discharge status: Home / Self Care | Payer: 59 | Source: Ambulatory Visit | Attending: Hematology | Admitting: Hematology

## 2020-06-09 DIAGNOSIS — C50411 Malignant neoplasm of upper-outer quadrant of right female breast: Secondary | ICD-10-CM

## 2020-06-09 DIAGNOSIS — Z170 Estrogen receptor positive status [ER+]: Secondary | ICD-10-CM

## 2020-06-09 MED ORDER — GADOBUTROL 1 MMOL/ML IV SOLN
7.0000 mL | Freq: Once | INTRAVENOUS | Status: AC | PRN
  Administered 2020-06-09: 7 mL via INTRAVENOUS

## 2020-06-12 ENCOUNTER — Encounter (INDEPENDENT_AMBULATORY_CARE_PROVIDER_SITE_OTHER): Payer: Self-pay | Admitting: Family Medicine

## 2020-06-12 ENCOUNTER — Ambulatory Visit (INDEPENDENT_AMBULATORY_CARE_PROVIDER_SITE_OTHER): Payer: 59 | Admitting: Family Medicine

## 2020-06-12 ENCOUNTER — Other Ambulatory Visit (INDEPENDENT_AMBULATORY_CARE_PROVIDER_SITE_OTHER): Payer: Self-pay | Admitting: Bariatrics

## 2020-06-12 ENCOUNTER — Other Ambulatory Visit: Payer: Self-pay

## 2020-06-12 VITALS — BP 112/74 | HR 86 | Temp 98.1°F | Ht 63.0 in | Wt 150.0 lb

## 2020-06-12 DIAGNOSIS — R632 Polyphagia: Secondary | ICD-10-CM | POA: Diagnosis not present

## 2020-06-12 DIAGNOSIS — Z9889 Other specified postprocedural states: Secondary | ICD-10-CM | POA: Diagnosis not present

## 2020-06-12 DIAGNOSIS — Z6830 Body mass index (BMI) 30.0-30.9, adult: Secondary | ICD-10-CM

## 2020-06-12 DIAGNOSIS — E669 Obesity, unspecified: Secondary | ICD-10-CM

## 2020-06-12 MED ORDER — PHENTERMINE HCL 8 MG PO TABS: 8.0000 mg | ORAL_TABLET | Freq: Every day | ORAL | 0 refills | Status: DC

## 2020-06-12 MED ORDER — TOPIRAMATE 25 MG PO TABS: 25.0000 mg | ORAL_TABLET | Freq: Two times a day (BID) | ORAL | 0 refills | Status: DC

## 2020-06-12 NOTE — Progress Notes (Signed)
Chief Complaint:   OBESITY Alicia Hahn is here to discuss her progress with her obesity treatment plan along with follow-up of her obesity related diagnoses. Alicia Hahn is on the Category 1 Plan and keeping a food journal and adhering to recommended goals of 300-400 calories and 35 g protein and states she is following her eating plan approximately 80-90% of the time. Alicia Hahn states she is doing cardio 15-20 minutes 3 times per week.  Today's visit was #: 5 Starting weight: 171 lbs Starting date: 03/29/2020 Today's weight: 150 lbs Today's date: 06/12/2020 Total lbs lost to date: 21 lbs Total lbs lost since last in-office visit: 6  Interim History: Phentermine/topamax combo is controlling Alicia Hahn's appetite very well. Her protein intake is good. She does struggle with eating 6 oz of meat at supper.  She is leaving on a trip to [place] tomorrow.  She says her goal weight is now 140 lbs (24 BMI).  Subjective:   1. Polyphagia [name] notes Topamax and phentermine are working well for her appetite and cravings.  2. History of reconstruction of both breasts Alicia Hahn is status post right breast cancer and then reconstruction with bilateral implants. She has a consult scheduled with Dr. [name] fior replacement of breast implants and breast lift. She was referred here by Dr. [name] for weight loss.  Assessment/Plan:   1. Polyphagia -Dr. Owens Shark will refill phentermine. Refill: - topiramate (TOPAMAX) 25 MG tablet; Take 1 tablet (25 mg total) by mouth 2 (two) times daily.  Dispense: 60 tablet; Refill: 0  2. History of reconstruction of both breasts Follow up with Dr. [name] as directed.  3. Obesity, Current BMI 26.58 Alicia Hahn is currently in the action stage of change. As such, her goal is to continue with weight loss efforts. She has agreed to the Category 1 Plan.   Discussed substitutions for 2 oz protein: yogurt, Whisp snacks, Protein One bar. She does not drink milk.  Exercise goals: As  is  Behavioral modification strategies: increasing lean protein intake and travel eating strategies.  Alicia Hahn has agreed to follow-up with our clinic in 3 weeks.  Objective:   Blood pressure 112/74, pulse 86, temperature 98.1 F (36.7 C), temperature source Oral, height 5\' 3"  (1.6 m), weight 150 lb (68 kg), SpO2 99 %. Body mass index is 26.57 kg/m.  General: Cooperative, alert, well developed, in no acute distress. HEENT: Conjunctivae and lids unremarkable. Cardiovascular: Regular rhythm.  Lungs: Normal work of breathing. Neurologic: No focal deficits.   Lab Results  Component Value Date   CREATININE 0.91 05/25/2020   BUN 17 05/25/2020   NA 141 05/25/2020   K 4.2 05/25/2020   CL 108 05/25/2020   CO2 22 05/25/2020   Lab Results  Component Value Date   ALT 25 05/25/2020   AST 23 05/25/2020   ALKPHOS 97 05/25/2020   BILITOT 0.3 05/25/2020   Lab Results  Component Value Date   HGBA1C 5.5 02/21/2020   HGBA1C 5.1 02/02/2019   Lab Results  Component Value Date   INSULIN 6.8 03/29/2020   Lab Results  Component Value Date   TSH 2.200 02/21/2020   Lab Results  Component Value Date   CHOL 156 04/12/2020   HDL 53 04/12/2020   LDLCALC 82 04/12/2020   TRIG 119 04/12/2020   CHOLHDL 2.9 04/12/2020   Lab Results  Component Value Date   WBC 4.0 05/25/2020   HGB 14.7 05/25/2020   HCT 42.5 05/25/2020   MCV 89.3 05/25/2020   PLT 188  05/25/2020   Lab Results  Component Value Date   IRON 93 03/29/2020   TIBC 352 03/29/2020   FERRITIN 163 (H) 03/29/2020    Attestation Statements:   Reviewed by clinician on day of visit: allergies, medications, problem list, medical history, surgical history, family history, social history, and previous encounter notes.  Coral Ceo, CMA, am acting as Location manager for [employer], [place].  I have reviewed the above documentation for accuracy and completeness, and I agree with the above. -  Georgianne Fick, FNP

## 2020-06-13 ENCOUNTER — Encounter (INDEPENDENT_AMBULATORY_CARE_PROVIDER_SITE_OTHER): Payer: Self-pay | Admitting: Family Medicine

## 2020-07-02 ENCOUNTER — Ambulatory Visit (INDEPENDENT_AMBULATORY_CARE_PROVIDER_SITE_OTHER): Payer: 59 | Admitting: Family Medicine

## 2020-07-02 ENCOUNTER — Other Ambulatory Visit: Payer: Self-pay

## 2020-07-02 ENCOUNTER — Encounter (INDEPENDENT_AMBULATORY_CARE_PROVIDER_SITE_OTHER): Payer: Self-pay | Admitting: Family Medicine

## 2020-07-02 VITALS — BP 112/78 | HR 78 | Temp 97.9°F | Ht 63.0 in | Wt 150.0 lb

## 2020-07-02 DIAGNOSIS — Z9189 Other specified personal risk factors, not elsewhere classified: Secondary | ICD-10-CM | POA: Diagnosis not present

## 2020-07-02 DIAGNOSIS — Z6830 Body mass index (BMI) 30.0-30.9, adult: Secondary | ICD-10-CM

## 2020-07-02 DIAGNOSIS — Z853 Personal history of malignant neoplasm of breast: Secondary | ICD-10-CM | POA: Diagnosis not present

## 2020-07-02 DIAGNOSIS — E669 Obesity, unspecified: Secondary | ICD-10-CM | POA: Diagnosis not present

## 2020-07-02 DIAGNOSIS — R632 Polyphagia: Secondary | ICD-10-CM | POA: Diagnosis not present

## 2020-07-02 DIAGNOSIS — F411 Generalized anxiety disorder: Secondary | ICD-10-CM | POA: Diagnosis not present

## 2020-07-02 MED ORDER — TOPIRAMATE 25 MG PO TABS: 25.0000 mg | ORAL_TABLET | Freq: Two times a day (BID) | ORAL | 0 refills | Status: DC

## 2020-07-02 MED ORDER — PHENTERMINE HCL 8 MG PO TABS: 8.0000 mg | ORAL_TABLET | Freq: Every day | ORAL | 0 refills | Status: DC

## 2020-07-04 ENCOUNTER — Ambulatory Visit: Payer: 59 | Admitting: Physician Assistant

## 2020-07-04 ENCOUNTER — Ambulatory Visit: Payer: 59

## 2020-07-04 ENCOUNTER — Encounter: Payer: Self-pay | Admitting: Physician Assistant

## 2020-07-04 ENCOUNTER — Other Ambulatory Visit: Payer: Self-pay

## 2020-07-04 VITALS — BP 109/71 | HR 86 | Temp 98.0°F | Ht 63.0 in | Wt 153.9 lb

## 2020-07-04 DIAGNOSIS — E785 Hyperlipidemia, unspecified: Secondary | ICD-10-CM

## 2020-07-04 DIAGNOSIS — Z23 Encounter for immunization: Secondary | ICD-10-CM | POA: Diagnosis not present

## 2020-07-04 DIAGNOSIS — F5104 Psychophysiologic insomnia: Secondary | ICD-10-CM

## 2020-07-04 DIAGNOSIS — Z6827 Body mass index (BMI) 27.0-27.9, adult: Secondary | ICD-10-CM | POA: Diagnosis not present

## 2020-07-04 DIAGNOSIS — Z170 Estrogen receptor positive status [ER+]: Secondary | ICD-10-CM

## 2020-07-04 DIAGNOSIS — C50411 Malignant neoplasm of upper-outer quadrant of right female breast: Secondary | ICD-10-CM

## 2020-07-04 MED ORDER — ROSUVASTATIN CALCIUM 20 MG PO TABS: 20.0000 mg | ORAL_TABLET | Freq: Every day | ORAL | 1 refills | Status: DC

## 2020-07-04 NOTE — Patient Instructions (Signed)
Heart-Healthy Eating Plan Many factors influence your heart (coronary) health, including eating and exercise habits. Coronary risk increases with abnormal blood fat (lipid) levels. Heart-healthy meal planning includes limiting unhealthy fats,increasing healthy fats, and making other diet and lifestyle changes. What is my plan? Your health care provider may recommend that you: Limit your fat intake to _________% or less of your total calories each day. Limit your saturated fat intake to _________% or less of your total calories each day. Limit the amount of cholesterol in your diet to less than _________ mg per day. What are tips for following this plan? Cooking Cook foods using methods other than frying. Baking, boiling, grilling, and broiling are all good options. Other ways to reduce fat include: Removing the skin from poultry. Removing all visible fats from meats. Steaming vegetables in water or broth. Meal planning  At meals, imagine dividing your plate into fourths: Fill one-half of your plate with vegetables and green salads. Fill one-fourth of your plate with whole grains. Fill one-fourth of your plate with lean protein foods. Eat 4-5 servings of vegetables per day. One serving equals 1 cup raw or cooked vegetable, or 2 cups raw leafy greens. Eat 4-5 servings of fruit per day. One serving equals 1 medium whole fruit,  cup dried fruit,  cup fresh, frozen, or canned fruit, or  cup 100% fruit juice. Eat more foods that contain soluble fiber. Examples include apples, broccoli, carrots, beans, peas, and barley. Aim to get 25-30 g of fiber per day. Increase your consumption of legumes, nuts, and seeds to 4-5 servings per week. One serving of dried beans or legumes equals  cup cooked, 1 serving of nuts is  cup, and 1 serving of seeds equals 1 tablespoon.  Fats Choose healthy fats more often. Choose monounsaturated and polyunsaturated fats, such as olive and canola oils, flaxseeds,  walnuts, almonds, and seeds. Eat more omega-3 fats. Choose salmon, mackerel, sardines, tuna, flaxseed oil, and ground flaxseeds. Aim to eat fish at least 2 times each week. Check food labels carefully to identify foods with trans fats or high amounts of saturated fat. Limit saturated fats. These are found in animal products, such as meats, butter, and cream. Plant sources of saturated fats include palm oil, palm kernel oil, and coconut oil. Avoid foods with partially hydrogenated oils in them. These contain trans fats. Examples are stick margarine, some tub margarines, cookies, crackers, and other baked goods. Avoid fried foods. General information Eat more home-cooked food and less restaurant, buffet, and fast food. Limit or avoid alcohol. Limit foods that are high in starch and sugar. Lose weight if you are overweight. Losing just 5-10% of your body weight can help your overall health and prevent diseases such as diabetes and heart disease. Monitor your salt (sodium) intake, especially if you have high blood pressure. Talk with your health care provider about your sodium intake. Try to incorporate more vegetarian meals weekly. What foods can I eat? Fruits All fresh, canned (in natural juice), or frozen fruits. Vegetables Fresh or frozen vegetables (raw, steamed, roasted, or grilled). Green salads. Grains Most grains. Choose whole wheat and whole grains most of the time. Rice andpasta, including brown rice and pastas made with whole wheat. Meats and other proteins Lean, well-trimmed beef, veal, pork, and lamb. Chicken and turkey without skin. All fish and shellfish. Wild duck, rabbit, pheasant, and venison. Egg whites or low-cholesterol egg substitutes. Dried beans, peas, lentils, and tofu. Seedsand most nuts. Dairy Low-fat or nonfat cheeses, including ricotta   and mozzarella. Skim or 1% milk (liquid, powdered, or evaporated). Buttermilk made with low-fat milk. Nonfat orlow-fat yogurt. Fats  and oils Non-hydrogenated (trans-free) margarines. Vegetable oils, including soybean, sesame, sunflower, olive, peanut, safflower, corn, canola, and cottonseed. Salad dressings or mayonnaisemade with a vegetable oil. Beverages Water (mineral or sparkling). Coffee and tea. Diet carbonated beverages. Sweets and desserts Sherbet, gelatin, and fruit ice. Small amounts of dark chocolate. Limit all sweets and desserts. Seasonings and condiments All seasonings and condiments. The items listed above may not be a complete list of foods and beverages you can eat. Contact a dietitian for more options. What foods are not recommended? Fruits Canned fruit in heavy syrup. Fruit in cream or butter sauce. Fried fruit. Limitcoconut. Vegetables Vegetables cooked in cheese, cream, or butter sauce. Fried vegetables. Grains Breads made with saturated or trans fats, oils, or whole milk. Croissants. Sweet rolls. Donuts. High-fat crackers,such as cheese crackers. Meats and other proteins Fatty meats, such as hot dogs, ribs, sausage, bacon, rib-eye roast or steak. High-fat deli meats, such as salami and bologna. Caviar. Domestic duck andgoose. Organ meats, such as liver. Dairy Cream, sour cream, cream cheese, and creamed cottage cheese. Whole milk cheeses. Whole or 2% milk (liquid, evaporated, or condensed). Whole buttermilk.Cream sauce or high-fat cheese sauce. Whole-milk yogurt. Fats and oils Meat fat, or shortening. Cocoa butter, hydrogenated oils, palm oil, coconut oil, palm kernel oil. Solid fats and shortenings, including bacon fat, salt pork, lard, and butter. Nondairy cream substitutes. Salad dressings with cheeseor sour cream. Beverages Regular sodas and any drinks with added sugar. Sweets and desserts Frosting. Pudding. Cookies. Cakes. Pies. Milk chocolate or white chocolate.Buttered syrups. Full-fat ice cream or ice cream drinks. The items listed above may not be a complete list of foods and beverages to  avoid. Contact a dietitian for more information. Summary Heart-healthy meal planning includes limiting unhealthy fats, increasing healthy fats, and making other diet and lifestyle changes. Lose weight if you are overweight. Losing just 5-10% of your body weight can help your overall health and prevent diseases such as diabetes and heart disease. Focus on eating a balance of foods, including fruits and vegetables, low-fat or nonfat dairy, lean protein, nuts and legumes, whole grains, and heart-healthy oils and fats. This information is not intended to replace advice given to you by your health care provider. Make sure you discuss any questions you have with your healthcare provider. Document Revised: 02/13/2017 Document Reviewed: 02/13/2017 Elsevier Patient Education  2022 Elsevier Inc.  

## 2020-07-04 NOTE — Progress Notes (Signed)
Chief Complaint:   OBESITY Alicia Hahn is here to discuss her progress with her obesity treatment plan along with follow-up of her obesity related diagnoses.   Today's visit was #: 6 Starting weight: 171 lbs Starting date: 03/29/2020 Today's weight: 150 lbs Today's date: 07/02/2020 Weight change since last visit: 0 lbs Total lbs lost to date: 21 lbs Body mass index is 26.57 kg/m.  Total weight loss percentage to date: 12.28%  Interim History:Alicia Hahn recently went on a 2 week cruise through [place], and Alicia really enjoyed herself. Alicia said that Alicia did make good choices while Alicia was gone.   Current Meal Plan: the Category 1 Plan.  Current Exercise Plan: walking for 20 minutes 5-7 times per week. Current Anti-Obesity Medications: Phentermine 8mg  tabs 1, tab by mouth daily and Topamax 25mg , 1 tablet by mouth twice daily. Side effects: None at this time.  Assessment/Plan:  1. Polyphagia Controlled. Current treatment: Phentermine 8mg  daily and Topamax 25mg  twice daily. Polyphagia refers to excessive feelings of hunger. Alicia will continue to focus on protein-rich, low simple carbohydrate foods. We reviewed the importance of hydration, regular exercise for stress reduction, and restorative sleep.  Refill medications as per below:  - Refill: topiramate (TOPAMAX) 25 MG tablet; Take 1 tablet (25 mg total) by mouth 2 (two) times daily.  Dispense: 60 tablet; Refill: 0 - Refill: Phentermine HCl 8 MG TABS; Take 8 mg by mouth daily.  Dispense: 30 tablet; Refill: 0  I have consulted the [place] Controlled Substances Registry for this patient, and feel the risk/benefit ratio today is favorable for proceeding with this prescription for a controlled substance. The patient understands monitoring parameters and red flags.   2. GAD (generalized anxiety disorder) Behavior modification techniques were discussed today to help Alicia Hahn deal with her anxiety.  3. History of breast cancer Taking Femara 2.5 mg daily.  We will continue to monitor symptoms as they relate to her weight loss journey.  4. At risk for heart disease Due to Alicia Hahn's current state of health and medical condition(s), Alicia is at a higher risk for heart disease.  This puts the patient at much greater risk to subsequently develop cardiopulmonary conditions that can significantly affect patient's quality of life in a negative manner.    At least 15 minutes were spent on counseling Alicia Hahn about these concerns today. Evidence-based interventions for health behavior change were utilized today including the discussion of self monitoring techniques, problem-solving barriers, and SMART goal setting techniques.  Specifically, regarding patient's less desirable eating habits and patterns, we employed the technique of small changes when Alicia Hahn has not been able to fully commit to her prudent nutritional plan.  5. Obesity with current BMI of 26.7  Course: Alicia Hahn is currently in the action stage of change. As such, her goal is to continue with weight loss efforts.   Nutrition goals: Alicia has agreed to the Category 1 Plan.   Exercise goals:  As is   Behavioral modification strategies: increasing lean protein intake, decreasing simple carbohydrates, and increasing vegetables.  Alicia Hahn has agreed to follow-up with our clinic in 3 weeks. Alicia was informed of the importance of frequent follow-up visits to maximize her success with intensive lifestyle modifications for her multiple health conditions.   Objective:   Blood pressure 112/78, pulse 78, temperature 97.9 F (36.6 C), height 5\' 3"  (1.6 m), weight 150 lb (68 kg), SpO2 (!) 9 %. Body mass index is 26.57 kg/m.  General: Cooperative, alert, well developed, in no acute  distress. HEENT: Conjunctivae and lids unremarkable. Cardiovascular: Regular rhythm.  Lungs: Normal work of breathing. Neurologic: No focal deficits.   Lab Results  Component Value Date   CREATININE 0.91 05/25/2020   BUN 17  05/25/2020   NA 141 05/25/2020   K 4.2 05/25/2020   CL 108 05/25/2020   CO2 22 05/25/2020   Lab Results  Component Value Date   ALT 25 05/25/2020   AST 23 05/25/2020   ALKPHOS 97 05/25/2020   BILITOT 0.3 05/25/2020   Lab Results  Component Value Date   HGBA1C 5.5 02/21/2020   HGBA1C 5.1 02/02/2019   Lab Results  Component Value Date   INSULIN 6.8 03/29/2020   Lab Results  Component Value Date   TSH 2.200 02/21/2020   Lab Results  Component Value Date   CHOL 156 04/12/2020   HDL 53 04/12/2020   LDLCALC 82 04/12/2020   TRIG 119 04/12/2020   CHOLHDL 2.9 04/12/2020   Lab Results  Component Value Date   WBC 4.0 05/25/2020   HGB 14.7 05/25/2020   HCT 42.5 05/25/2020   MCV 89.3 05/25/2020   PLT 188 05/25/2020   Lab Results  Component Value Date   IRON 93 03/29/2020   TIBC 352 03/29/2020   FERRITIN 163 (H) 03/29/2020   Attestation Statements:   Reviewed by clinician on day of visit: allergies, medications, problem list, medical history, surgical history, family history, social history, and previous encounter notes.  I, Noah Mincy,LPN am acting as Location manager for [employer], DO.  I have reviewed the above documentation for accuracy and completeness, and I agree with the above. Briscoe Deutscher, DO

## 2020-07-04 NOTE — Progress Notes (Signed)
Established Patient Office Visit  Subjective:  Patient ID: Alicia Hahn, female    DOB: 09/06/1960  Age: 60 y.o. MRN: [id number]  CC:  Chief Complaint  Patient presents with   Follow-up   Hyperlipidemia     HPI Alicia Hahn presents for follow up on hyperlipidemia and second dose of Shingrix. Patient is doing well and has no acute concerns today. Taking rosuvastatin without issues. Patient is on weight loss program ([facility]) and has lost 21 pounds since starting her weight loss journey. Patient walks for about 20 minutes 5-7 days/wk and continues with dietary changes.   Insomnia: States rarely has to take Trazodone. Is sleeping better. States on average gets about 6-7 hrs of sleep per night.  Right breast cancer: Followed by Oncology. Patient is on Letrozole. Reports has an appointment with Dr. [name] to discuss implant removal/replacement.     Past Medical History:  Diagnosis Date   Acid reflux    occasionally, and takes pepcid OTC   Anxiety    Breast cancer ([place])    Cancer ([place])    breast cancer   Gastric ulcer    GERD (gastroesophageal reflux disease)    Hyperlipidemia    Joint pain    Kidney stones    Lower extremity edema    Personal history of chemotherapy    Personal history of radiation therapy    Stomach ulcer    Vitamin D deficiency     Past Surgical History:  Procedure Laterality Date   AUGMENTATION MAMMAPLASTY     BREAST LUMPECTOMY     BREAST LUMPECTOMY WITH RADIOACTIVE SEED AND SENTINEL LYMPH NODE BIOPSY Right 01/27/2019   Procedure: RIGHT BREAST LUMPECTOMY WITH RADIOACTIVE SEED AND SENTINEL LYMPH NODE MAPPING;  Surgeon: Erroll Luna, MD;  Location: [facility];  Service: General;  Laterality: Right;   COSMETIC SURGERY Bilateral 2000   breast aug/ lipo   PORT-A-CATH REMOVAL Right 04/05/2020   Procedure: REMOVAL PORT-A-CATH;  Surgeon: Erroll Luna, MD;  Location: [facility];   Service: General;  Laterality: Right;   PORTACATH PLACEMENT Right 01/27/2019   Procedure: INSERTION PORT-A-CATH WITH ULTRASOUND;  Surgeon: Erroll Luna, MD;  Location: [facility];  Service: General;  Laterality: Right;    Family History  Problem Relation Age of Onset   Diabetes Mother    Heart disease Mother    Hyperlipidemia Mother    Breast cancer Mother 59   Cancer Mother        breast DCIS   Cancer Father 66       prostate cancer    Diabetes Father    Hypertension Father    Hyperlipidemia Father    Heart disease Father    Breast cancer Sister 34   Cancer Sister 5       breast cancer    Cancer Maternal Aunt 55       breast cancer    Cancer Maternal Grandfather        colon cancer   Colon cancer Maternal Grandfather    Esophageal cancer Neg Hx    Rectal cancer Neg Hx    Stomach cancer Neg Hx     Social History   Socioeconomic History   Marital status: Married    Spouse name: Not on file   Number of children: 2   Years of education: Not on file   Highest education level: Not on file  Occupational History   Occupation: retired  Tobacco Use  Smoking status: Former    Packs/day: 0.25    Years: 5.00    Pack years: 1.25    Types: Cigarettes    Quit date: 01/20/1978    Years since quitting: 42.4   Smokeless tobacco: Never   Tobacco comments:    1982  Vaping Use   Vaping Use: Never used  Substance and Sexual Activity   Alcohol use: Yes    Alcohol/week: 6.0 standard drinks    Types: 6 Glasses of wine per week    Comment: occasional   Drug use: No   Sexual activity: Yes    Birth control/protection: None  Other Topics Concern   Not on file  Social History Narrative   Not on file   Social Determinants of Health   Financial Resource Strain: Not on file  Food Insecurity: Not on file  Transportation Needs: Not on file  Physical Activity: Not on file  Stress: Not on file  Social Connections: Not on file  Intimate Partner Violence: Not on  file    Outpatient Medications Prior to Visit  Medication Sig Dispense Refill   CALCIUM PO Take 1,000 mg by mouth daily.     letrozole (FEMARA) 2.5 MG tablet Take 1 tablet (2.5 mg total) by mouth daily. 90 tablet 3   Magnesium 125 MG CAPS Take 1 capsule by mouth daily.      Multiple Vitamins-Minerals (MULTIVITAMIN WITH MINERALS) tablet Take 1 tablet by mouth daily.     Phentermine HCl 8 MG TABS Take 8 mg by mouth daily. 30 tablet 0   topiramate (TOPAMAX) 25 MG tablet Take 1 tablet (25 mg total) by mouth 2 (two) times daily. 60 tablet 0   traZODone (DESYREL) 50 MG tablet Take 1 tablet (50 mg total) by mouth at bedtime as needed for sleep. 90 tablet 1   venlafaxine XR (EFFEXOR-XR) 37.5 MG 24 hr capsule TAKE 2 CAPSULES BY MOUTH  DAILY WITH BREAKFAST 60 capsule 11   rosuvastatin (CRESTOR) 20 MG tablet Take 1 tablet (20 mg total) by mouth daily. 90 tablet 0   No facility-administered medications prior to visit.    No Known Allergies  ROS Review of Systems A fourteen system review of systems was performed and found to be positive as per HPI.   Objective:    Physical Exam General:  Well Developed, well nourished, appropriate for stated age.  Neuro:  Alert and oriented,  extra-ocular muscles intact  HEENT:  Normocephalic, atraumatic, neck supple Skin:  no gross rash, warm, pink. Cardiac:  RRR, S1 S2 Respiratory:  ECTA B/L w/o wheezing, crackles or rales, Not using accessory muscles, speaking in full sentences- unlabored. Vascular:  Ext warm, no cyanosis apprec.; cap RF less 2 sec. No edema  Psych:  No HI/SI, judgement and insight good, Euthymic mood. Full Affect.  BP 109/71   Pulse 86   Temp 98 F (36.7 C)   Ht _0  (1.6 m)   Wt 153 lb 14.4 oz (69.8 kg)   SpO2 99%   BMI 27.26 kg/m  Wt Readings from Last 3 Encounters:  07/04/20 153 lb 14.4 oz (69.8 kg)  07/02/20 150 lb (68 kg)  06/12/20 150 lb (68 kg)     Health Maintenance Due  Topic Date Due   Pneumococcal Vaccine 35-71  Years old (1 - PCV) Never done   HIV Screening  Never done   Hepatitis C Screening  Never done   PAP SMEAR-Modifier  Never done   COVID-19 Vaccine (4 - Booster  for [place] series) 02/15/2020   Zoster Vaccines- Shingrix (2 of 2) 04/19/2020    There are no preventive care reminders to display for this patient.  Lab Results  Component Value Date   TSH 2.200 02/21/2020   Lab Results  Component Value Date   WBC 4.0 05/25/2020   HGB 14.7 05/25/2020   HCT 42.5 05/25/2020   MCV 89.3 05/25/2020   PLT 188 05/25/2020   Lab Results  Component Value Date   NA 141 05/25/2020   K 4.2 05/25/2020   CO2 22 05/25/2020   GLUCOSE 100 (H) 05/25/2020   BUN 17 05/25/2020   CREATININE 0.91 05/25/2020   BILITOT 0.3 05/25/2020   ALKPHOS 97 05/25/2020   AST 23 05/25/2020   ALT 25 05/25/2020   PROT 6.9 05/25/2020   ALBUMIN 3.8 05/25/2020   CALCIUM 9.4 05/25/2020   ANIONGAP 11 05/25/2020   EGFR 88 03/29/2020   Lab Results  Component Value Date   CHOL 156 04/12/2020   Lab Results  Component Value Date   HDL 53 04/12/2020   Lab Results  Component Value Date   LDLCALC 82 04/12/2020   Lab Results  Component Value Date   TRIG 119 04/12/2020   Lab Results  Component Value Date   CHOLHDL 2.9 04/12/2020   Lab Results  Component Value Date   HGBA1C 5.5 02/21/2020      Assessment & Plan:   Problem List Items Addressed This Visit       Other   Malignant neoplasm of upper-outer quadrant of right breast in female, estrogen receptor positive ([place])   Psychophysiological insomnia   Hyperlipidemia - Primary   Relevant Medications   rosuvastatin (CRESTOR) 20 MG tablet   Other Visit Diagnoses     Need for shingles vaccine       Relevant Orders   Varicella-zoster vaccine IM (Shingrix)   BMI 27.0-27.9,adult          Hyperlipidemia: -Last lipid panel: Total cholesterol 156, triglycerides 119, HDL 53, LDL 82 (improved from 203) -Continue current medication regimen.  04/12/2020 ALT  29, AST 25 -Continue weight loss efforts and heart healthy diet. -Will continue to monitor.  Psychophysiological insomnia: -Controlled. -Continue trazodone as needed. -Will continue to monitor.  Malignant neoplasm of upper-outer quadrant of right breast in female, estrogen receptor positive: -Followed by oncology. -S/p lumpectomy, adjuvant radiation and chemotherapy.  -On letrozole  BMI 27.0-27.9, adult: -Associated with hyperlipidemia. -Recommend to continue weight loss program with healthy weight and wellness.    Meds ordered this encounter  Medications   rosuvastatin (CRESTOR) 20 MG tablet    Sig: Take 1 tablet (20 mg total) by mouth daily.    Dispense:  90 tablet    Refill:  1    Order Specific Question:   Supervising Provider    Answer:   Beatrice Lecher D [[id number]]     Follow-up: Return in about 6 months (around 01/03/2021) for HLD, Wt.   Note:  This note was prepared with assistance of Dragon voice recognition software. Occasional wrong-word or sound-a-like substitutions may have occurred due to the inherent limitations of voice recognition software.   Lorrene Reid, PA-C

## 2020-07-13 ENCOUNTER — Other Ambulatory Visit (INDEPENDENT_AMBULATORY_CARE_PROVIDER_SITE_OTHER): Payer: Self-pay | Admitting: Family Medicine

## 2020-07-13 DIAGNOSIS — R632 Polyphagia: Secondary | ICD-10-CM

## 2020-07-16 NOTE — Telephone Encounter (Signed)
Last OV with Dr Wallace 

## 2020-07-19 ENCOUNTER — Encounter: Payer: Self-pay | Admitting: Physician Assistant

## 2020-07-24 ENCOUNTER — Ambulatory Visit (INDEPENDENT_AMBULATORY_CARE_PROVIDER_SITE_OTHER): Payer: 59 | Admitting: Family Medicine

## 2020-07-24 ENCOUNTER — Other Ambulatory Visit: Payer: Self-pay

## 2020-07-24 ENCOUNTER — Encounter (INDEPENDENT_AMBULATORY_CARE_PROVIDER_SITE_OTHER): Payer: Self-pay | Admitting: Family Medicine

## 2020-07-24 VITALS — BP 119/82 | HR 85 | Temp 98.2°F | Ht 63.0 in | Wt 145.0 lb

## 2020-07-24 DIAGNOSIS — Z6830 Body mass index (BMI) 30.0-30.9, adult: Secondary | ICD-10-CM

## 2020-07-24 DIAGNOSIS — E669 Obesity, unspecified: Secondary | ICD-10-CM

## 2020-07-24 DIAGNOSIS — E7849 Other hyperlipidemia: Secondary | ICD-10-CM

## 2020-07-24 DIAGNOSIS — R632 Polyphagia: Secondary | ICD-10-CM | POA: Diagnosis not present

## 2020-07-24 MED ORDER — TOPIRAMATE 25 MG PO TABS: 25.0000 mg | ORAL_TABLET | Freq: Two times a day (BID) | ORAL | 0 refills | Status: DC

## 2020-07-26 ENCOUNTER — Encounter (INDEPENDENT_AMBULATORY_CARE_PROVIDER_SITE_OTHER): Payer: Self-pay | Admitting: Family Medicine

## 2020-07-26 NOTE — Progress Notes (Signed)
Chief Complaint:   OBESITY Alicia Hahn is here to discuss her progress with her obesity treatment plan along with follow-up of her obesity related diagnoses. Alicia Hahn is on the Category 1 Plan and states she is following her eating plan approximately 90% of the time. Alicia Hahn states she is doing yard work 20 minutes 3-4 times per week.  Today's visit was #: 7 Starting weight: 171 lbs Starting date: 03/29/2020 Today's weight: 145 lbs Today's date: 07/24/2020 Total lbs lost to date: 26 Total lbs lost since last in-office visit: 5  Interim History: Alicia Hahn appetite is well controlled with Phentermine and Topamax. She maintained her weight on her recent trip to [place]. She does not always get in the full dinner portion of meat. Her water intake is good. Pt goal is 140 lb (24 BMI).  Subjective:   1. Polyphagia Alicia Hahn's appetite is well controlled. She denies side effects of Topamax and Phentermine.  2. Other hyperlipidemia Alicia Hahn LDL is at goal at 82. She is on Crestor 20 mg. The 10-year ASCVD risk score Alicia Hahn [name], et al., 2013) is: 3%   Values used to calculate the score:     Age: 60 years     Sex: Female     Is Non-Hispanic African American: No     Diabetic: No     Tobacco smoker: No     Systolic Blood Pressure: 161 mmHg     Is BP treated: No     HDL Cholesterol: 53 mg/dL     Total Cholesterol: 156 mg/dL  Lab Results  Component Value Date   ALT 25 05/25/2020   AST 23 05/25/2020   ALKPHOS 97 05/25/2020   BILITOT 0.3 05/25/2020   Lab Results  Component Value Date   CHOL 156 04/12/2020   HDL 53 04/12/2020   LDLCALC 82 04/12/2020   TRIG 119 04/12/2020   CHOLHDL 2.9 04/12/2020   Assessment/Plan:   1. Polyphagia We will refill Topamax 25 mg BID, as prescribed below.  Refill- topiramate (TOPAMAX) 25 MG tablet; Take 1 tablet (25 mg total) by mouth 2 (two) times daily.  Dispense: 60 tablet; Refill: 0  2. Other hyperlipidemia Continue Crestor 20 mg as directed.  3.  Overweight: Current BMI of 25.69  Alicia Hahn is currently in the action stage of change. As such, her goal is to continue with weight loss efforts. She has agreed to the Category 1 Plan.   Exercise goals: All adults should avoid inactivity. Some physical activity is better than none, and adults who participate in any amount of physical activity gain some health benefits. Encouraged resistance training 2 times a week.  Behavioral modification strategies: increasing lean protein intake.  Alicia Hahn has agreed to follow-up with our clinic in 4 weeks.  Objective:   Blood pressure 119/82, pulse 85, temperature 98.2 F (36.8 C), height 5\' 3"  (1.6 m), weight 145 lb (65.8 kg), SpO2 99 %. Body mass index is 25.69 kg/m.  General: Cooperative, alert, well developed, in no acute distress. HEENT: Conjunctivae and lids unremarkable. Cardiovascular: Regular rhythm.  Lungs: Normal work of breathing. Neurologic: No focal deficits.   Lab Results  Component Value Date   CREATININE 0.91 05/25/2020   BUN 17 05/25/2020   NA 141 05/25/2020   K 4.2 05/25/2020   CL 108 05/25/2020   CO2 22 05/25/2020   Lab Results  Component Value Date   ALT 25 05/25/2020   AST 23 05/25/2020   ALKPHOS 97 05/25/2020   BILITOT 0.3 05/25/2020  Lab Results  Component Value Date   HGBA1C 5.5 02/21/2020   HGBA1C 5.1 02/02/2019   Lab Results  Component Value Date   INSULIN 6.8 03/29/2020   Lab Results  Component Value Date   TSH 2.200 02/21/2020   Lab Results  Component Value Date   CHOL 156 04/12/2020   HDL 53 04/12/2020   LDLCALC 82 04/12/2020   TRIG 119 04/12/2020   CHOLHDL 2.9 04/12/2020   Lab Results  Component Value Date   VD25OH 144.0 (H) 03/29/2020   VD25OH 76.8 02/02/2019   Lab Results  Component Value Date   WBC 4.0 05/25/2020   HGB 14.7 05/25/2020   HCT 42.5 05/25/2020   MCV 89.3 05/25/2020   PLT 188 05/25/2020   Lab Results  Component Value Date   IRON 93 03/29/2020   TIBC 352 03/29/2020    FERRITIN 163 (H) 03/29/2020    Attestation Statements:   Reviewed by clinician on day of visit: allergies, medications, problem list, medical history, surgical history, family history, social history, and previous encounter notes.  Coral Ceo, CMA, am acting as Location manager for [employer], [facility].  I have reviewed the above documentation for accuracy and completeness, and I agree with the above. -  Georgianne Fick, FNP

## 2020-07-27 ENCOUNTER — Ambulatory Visit: Payer: 59 | Admitting: Plastic Surgery

## 2020-08-07 ENCOUNTER — Encounter: Payer: Self-pay | Admitting: Plastic Surgery

## 2020-08-07 ENCOUNTER — Other Ambulatory Visit: Payer: Self-pay

## 2020-08-07 ENCOUNTER — Ambulatory Visit (INDEPENDENT_AMBULATORY_CARE_PROVIDER_SITE_OTHER): Payer: 59 | Admitting: Plastic Surgery

## 2020-08-07 VITALS — Wt 149.4 lb

## 2020-08-07 DIAGNOSIS — C50411 Malignant neoplasm of upper-outer quadrant of right female breast: Secondary | ICD-10-CM | POA: Diagnosis not present

## 2020-08-07 DIAGNOSIS — Z9013 Acquired absence of bilateral breasts and nipples: Secondary | ICD-10-CM | POA: Diagnosis not present

## 2020-08-07 DIAGNOSIS — Z853 Personal history of malignant neoplasm of breast: Secondary | ICD-10-CM

## 2020-08-07 DIAGNOSIS — Z170 Estrogen receptor positive status [ER+]: Secondary | ICD-10-CM

## 2020-08-07 DIAGNOSIS — N62 Hypertrophy of breast: Secondary | ICD-10-CM | POA: Diagnosis not present

## 2020-08-07 NOTE — Progress Notes (Addendum)
Subjective:    Patient ID: Alicia Hahn, female    DOB: [birth year], 60 y.o.   MRN: [id number]  The patient is a very sweet 60 year old lady here for evaluation of her breasts.  She had a partial mastectomy of her right breast January 2021 followed by radiation.  She had pre-existing implants that were style 68 450 to 480 cc with 480 cc in the [place] saline implants.  Those replaced by Dr. Luvenia Heller ~ 23 years ago.  Implants are submuscular.  She went from a D size bra to a DD.  She has lost significant weight over the past year and is now 149 pounds.  She has some asymmetry due to the radiation damage and partial mastectomy.  She is interested in a mastopexy and going smaller with her implants.  I think this is a good idea.  I do not feel any lumps or bumps.  She does have a spongy look to her right breast from the radiation.  She was able to lose weight with the help of the [facility].  She is likely going to decrease her weight by an additional 10 to 15 pounds.     Review of Systems  Constitutional: Negative.   HENT: Negative.    Eyes: Negative.   Respiratory: Negative.    Cardiovascular: Negative.   Gastrointestinal: Negative.   Endocrine: Negative.   Genitourinary: Negative.   Musculoskeletal:  Positive for back pain and neck pain.  Skin:  Positive for rash.  Hematological: Negative.   Psychiatric/Behavioral: Negative.        Objective:   Physical Exam Vitals and nursing note reviewed.  Constitutional:      Appearance: Normal appearance.  HENT:     Head: Normocephalic and atraumatic.  Cardiovascular:     Rate and Rhythm: Normal rate.     Pulses: Normal pulses.  Pulmonary:     Effort: Pulmonary effort is normal. No respiratory distress.     Breath sounds: No wheezing.  Abdominal:     General: Abdomen is flat. There is no distension.  Musculoskeletal:        General: No swelling or deformity.  Skin:    General: Skin is warm.     Capillary  Refill: Capillary refill takes less than 2 seconds.     Coloration: Skin is not jaundiced.     Findings: No bruising.  Neurological:     General: No focal deficit present.     Mental Status: She is alert and oriented to person, place, and time.        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 Symptomatic mammary hypertrophy  N62     2. Malignant neoplasm of upper-outer quadrant of right breast in female, estrogen receptor positive ([place])  C50.411    Z17.0     3. History of breast cancer  Z85.3     4. Acquired absence of both breasts  Z90.13         We talked about staging the procedures and that being actually the best way to go.  She most likely will try to do it all in 1 step.  The current plan is for removal of the current breast implants with replacement with smaller saline implants and a mastopexy.  We discussed risks and complications which are very high for the right breast due to her radiation damage.   Pictures were obtained of the  patient and placed in the chart with the patient's or guardian's permission.

## 2020-08-08 ENCOUNTER — Other Ambulatory Visit (INDEPENDENT_AMBULATORY_CARE_PROVIDER_SITE_OTHER): Payer: Self-pay | Admitting: Family Medicine

## 2020-08-08 DIAGNOSIS — R632 Polyphagia: Secondary | ICD-10-CM

## 2020-08-08 DIAGNOSIS — Z853 Personal history of malignant neoplasm of breast: Secondary | ICD-10-CM | POA: Insufficient documentation

## 2020-08-08 DIAGNOSIS — Z9010 Acquired absence of unspecified breast and nipple: Secondary | ICD-10-CM | POA: Insufficient documentation

## 2020-08-15 ENCOUNTER — Other Ambulatory Visit (INDEPENDENT_AMBULATORY_CARE_PROVIDER_SITE_OTHER): Payer: Self-pay | Admitting: Family Medicine

## 2020-08-15 DIAGNOSIS — R632 Polyphagia: Secondary | ICD-10-CM

## 2020-08-15 NOTE — Telephone Encounter (Signed)
Pt was last seen by Jake Bathe, FNP but controlled medication was last prescribed by Dr. Juleen China.

## 2020-08-15 NOTE — Telephone Encounter (Signed)
Last OV with Dawn 

## 2020-08-22 ENCOUNTER — Ambulatory Visit (INDEPENDENT_AMBULATORY_CARE_PROVIDER_SITE_OTHER): Payer: 59 | Admitting: Family Medicine

## 2020-08-22 ENCOUNTER — Encounter (INDEPENDENT_AMBULATORY_CARE_PROVIDER_SITE_OTHER): Payer: Self-pay | Admitting: Family Medicine

## 2020-08-22 ENCOUNTER — Other Ambulatory Visit: Payer: Self-pay

## 2020-08-22 VITALS — BP 108/74 | HR 84 | Temp 98.2°F | Ht 63.0 in | Wt 143.0 lb

## 2020-08-22 DIAGNOSIS — Z6830 Body mass index (BMI) 30.0-30.9, adult: Secondary | ICD-10-CM | POA: Diagnosis not present

## 2020-08-22 DIAGNOSIS — E785 Hyperlipidemia, unspecified: Secondary | ICD-10-CM

## 2020-08-22 DIAGNOSIS — Z9189 Other specified personal risk factors, not elsewhere classified: Secondary | ICD-10-CM | POA: Diagnosis not present

## 2020-08-22 DIAGNOSIS — E669 Obesity, unspecified: Secondary | ICD-10-CM | POA: Diagnosis not present

## 2020-08-22 DIAGNOSIS — E7849 Other hyperlipidemia: Secondary | ICD-10-CM | POA: Diagnosis not present

## 2020-08-22 DIAGNOSIS — R632 Polyphagia: Secondary | ICD-10-CM

## 2020-08-22 MED ORDER — TOPIRAMATE 25 MG PO TABS: 25.0000 mg | ORAL_TABLET | Freq: Two times a day (BID) | ORAL | 0 refills | Status: DC

## 2020-08-22 MED ORDER — LOMAIRA 8 MG PO TABS: 1.0000 | ORAL_TABLET | Freq: Every day | ORAL | 0 refills | Status: DC

## 2020-08-28 NOTE — Progress Notes (Signed)
Chief Complaint:   OBESITY Alicia Hahn is here to discuss her progress with her obesity treatment plan along with follow-up of her obesity related diagnoses.   Today's visit was #: 8 Starting weight: 171 lbs Starting date: 03/29/2020 Today's weight: 143 lbs Today's date: 08/22/2020 Weight change since last visit: 2 lbs Total lbs lost to date: 28 lbs Body mass index is 25.33 kg/m.  Total weight loss percentage to date: -16.37%  Interim History:  Alicia Hahn is happy with her weight loss.  Her goal is 140 pounds.  She is not sure if Qsymia is helping. Current Meal Plan: the Category 1 Plan for 90% of the time.  Current Exercise Plan: Doing cardio and walking for 1 mile 4-5 times per week. Current Anti-Obesity Medications: Phentermine 8 mg daily and Topamax 25 mg twice daily. Side effects: None.  Assessment/Plan:   1. Polyphagia Controlled. Current treatment: Topamax 25 mg twice daily and phentermine 8 mg daily. She will continue to focus on protein-rich, low simple carbohydrate foods. We reviewed the importance of hydration, regular exercise for stress reduction, and restorative sleep.  Plan:  Her goal is to get below 140 pounds and then maintain.  Will refill Topamax and phentermine at current doses.  Having again reminded the patient of the "off label" use of Phentermine beyond three consecutive months, and again discussing the risks, benefits, contraindications, and limitations of it's use; given it's role in the successful treatment of obesity thus far and lack of adverse effect, patient has expressed desire and given informed verbal consent to continue use.   I have consulted the [place] Controlled Substances Registry for this patient, and feel the risk/benefit ratio today is favorable for proceeding with this prescription for a controlled substance. The patient understands monitoring parameters and red flags.   - Refill topiramate (TOPAMAX) 25 MG tablet; Take 1 tablet (25 mg total) by mouth 2  (two) times daily.  Dispense: 60 tablet; Refill: 0 - Refill Phentermine HCl (LOMAIRA) 8 MG TABS; Take 1 tablet by mouth daily.  Dispense: 30 tablet; Refill: 0  2. Other hyperlipidemia Course: At goal. Lipid-lowering medications: Crestor 20 mg daily.   Plan: Dietary changes: Increase soluble fiber, decrease simple carbohydrates, decrease saturated fat. Exercise changes: Moderate to vigorous-intensity aerobic activity 150 minutes per week or as tolerated. We will continue to monitor along with PCP/specialists as it pertains to her weight loss journey.  Lab Results  Component Value Date   CHOL 156 04/12/2020   HDL 53 04/12/2020   LDLCALC 82 04/12/2020   TRIG 119 04/12/2020   CHOLHDL 2.9 04/12/2020   Lab Results  Component Value Date   ALT 25 05/25/2020   AST 23 05/25/2020   ALKPHOS 97 05/25/2020   BILITOT 0.3 05/25/2020   The 10-year ASCVD risk score [name] DC Jr., et al., 2013) is: 2.5%   Values used to calculate the score:     Age: 60 years     Sex: Female     Is Non-Hispanic African American: No     Diabetic: No     Tobacco smoker: No     Systolic Blood Pressure: 123XX123 mmHg     Is BP treated: No     HDL Cholesterol: 53 mg/dL     Total Cholesterol: 156 mg/dL  3. At risk for impaired metabolic function Due to Alicia Hahn's current state of health and medical condition(s), she is at a significantly higher risk for impaired metabolic function.   At least 8 minutes was  spent on counseling Alicia Hahn about these concerns today.  This places the patient at a much greater risk to subsequently develop cardio-pulmonary conditions that can negatively affect the patient's quality of life.  I stressed the importance of reversing these risks factors.   4. Obesity, current BMI 25.4  Course: Alicia Hahn is currently in the action stage of change. As such, her goal is to continue with weight loss efforts.   Nutrition goals: She has agreed to the Category 1 Plan.   Exercise goals:  As is.  Behavioral  modification strategies: increasing lean protein intake, decreasing simple carbohydrates, increasing vegetables, and increasing water intake.  Alicia Hahn has agreed to follow-up with our clinic in 4 weeks. She was informed of the importance of frequent follow-up visits to maximize her success with intensive lifestyle modifications for her multiple health conditions.   Objective:   Blood pressure 108/74, pulse 84, temperature 98.2 F (36.8 C), temperature source Oral, height '5\' 3"'$  (1.6 m), weight 143 lb (64.9 kg), SpO2 98 %. Body mass index is 25.33 kg/m.  General: Cooperative, alert, well developed, in no acute distress. HEENT: Conjunctivae and lids unremarkable. Cardiovascular: Regular rhythm.  Lungs: Normal work of breathing. Neurologic: No focal deficits.   Lab Results  Component Value Date   CREATININE 0.91 05/25/2020   BUN 17 05/25/2020   NA 141 05/25/2020   K 4.2 05/25/2020   CL 108 05/25/2020   CO2 22 05/25/2020   Lab Results  Component Value Date   ALT 25 05/25/2020   AST 23 05/25/2020   ALKPHOS 97 05/25/2020   BILITOT 0.3 05/25/2020   Lab Results  Component Value Date   HGBA1C 5.5 02/21/2020   HGBA1C 5.1 02/02/2019   Lab Results  Component Value Date   INSULIN 6.8 03/29/2020   Lab Results  Component Value Date   TSH 2.200 02/21/2020   Lab Results  Component Value Date   CHOL 156 04/12/2020   HDL 53 04/12/2020   LDLCALC 82 04/12/2020   TRIG 119 04/12/2020   CHOLHDL 2.9 04/12/2020   Lab Results  Component Value Date   VD25OH 144.0 (H) 03/29/2020   VD25OH 76.8 02/02/2019   Lab Results  Component Value Date   WBC 4.0 05/25/2020   HGB 14.7 05/25/2020   HCT 42.5 05/25/2020   MCV 89.3 05/25/2020   PLT 188 05/25/2020   Lab Results  Component Value Date   IRON 93 03/29/2020   TIBC 352 03/29/2020   FERRITIN 163 (H) 03/29/2020   Attestation Statements:   Reviewed by clinician on day of visit: allergies, medications, problem list, medical history,  surgical history, family history, social history, and previous encounter notes.  I, Water quality scientist, CMA, am acting as transcriptionist for Briscoe Deutscher, DO  I have reviewed the above documentation for accuracy and completeness, and I agree with the above. Briscoe Deutscher, DO

## 2020-09-07 ENCOUNTER — Telehealth (INDEPENDENT_AMBULATORY_CARE_PROVIDER_SITE_OTHER): Payer: 59 | Admitting: Plastic Surgery

## 2020-09-07 ENCOUNTER — Encounter: Payer: Self-pay | Admitting: Plastic Surgery

## 2020-09-07 DIAGNOSIS — N6489 Other specified disorders of breast: Secondary | ICD-10-CM | POA: Diagnosis not present

## 2020-09-07 NOTE — Progress Notes (Signed)
   Subjective:    Patient ID: Alicia Hahn, female    DOB: 1960-11-06, 60 y.o.   MRN: [id number]  The patient is a 60 year old female joining me by telemetry visit for further discussion about her breast reconstruction.  She had a right sided partial mastectomy in 2021 followed by radiation which ended in July 2021.  Prior to that she had implants placed by Dr. Luvenia Heller.  She believes that they are 480 cc saline implants.  She also thinks that they are under the muscle.  She is lost 8 pounds since her last visit so she is now 142 pounds.  She is 5 feet 3 inches tall.  The sternal notch to right nipple was 27 cm and 30 cm on the left at the last visit.  Her asymmetry has gotten worse.  She has been really good with working with the healthy weight and wellness physicians.  When the implants were originally placed she went from a B cup to a DD cup.  She is interested in removal of the implants and smaller implants placed with a mastopexy.  The patient would like to go with saline implants.   Review of Systems  Constitutional: Negative.   Eyes: Negative.   Respiratory: Negative.    Cardiovascular: Negative.   Gastrointestinal: Negative.   Endocrine: Negative.   Genitourinary: Negative.   Neurological: Negative.   Hematological: Negative.   Psychiatric/Behavioral: Negative.        Objective:   Physical Exam      Assessment & Plan:     ICD-10-CM   1. Postoperative breast asymmetry  N64.89     The patient was at home and I was at the office.  I spent 20 minutes in the following manner: Discussion with patient, review of chart, dictation, ordering implants and requesting surgery.  I connected with  Alicia Hahn on 09/07/20 by a video enabled telemedicine application and verified that I am speaking with the correct person using two identifiers.   I discussed the limitations of evaluation and management by telemedicine. The patient expressed understanding and agreed to  proceed.  We will plan for removal of existing saline implants with mastopexy.  We will replace the implants if upper for pole fullness or overall size is needed.  She would like to be smaller than she is now.  She understands that due to the radiation of the right breast there may be some intraoperative challenges with nipple areola blood supply that would require a halt in the surgery.  We will talk more at her history and physical.  Order placed for surgery and implants.

## 2020-09-19 ENCOUNTER — Other Ambulatory Visit (INDEPENDENT_AMBULATORY_CARE_PROVIDER_SITE_OTHER): Payer: Self-pay | Admitting: Family Medicine

## 2020-09-19 ENCOUNTER — Other Ambulatory Visit: Payer: Self-pay

## 2020-09-19 ENCOUNTER — Ambulatory Visit (INDEPENDENT_AMBULATORY_CARE_PROVIDER_SITE_OTHER): Payer: 59 | Admitting: Family Medicine

## 2020-09-19 ENCOUNTER — Encounter (INDEPENDENT_AMBULATORY_CARE_PROVIDER_SITE_OTHER): Payer: Self-pay | Admitting: Family Medicine

## 2020-09-19 VITALS — BP 120/80 | HR 87 | Temp 98.4°F | Ht 63.0 in | Wt 140.0 lb

## 2020-09-19 DIAGNOSIS — E7849 Other hyperlipidemia: Secondary | ICD-10-CM | POA: Diagnosis not present

## 2020-09-19 DIAGNOSIS — Z6830 Body mass index (BMI) 30.0-30.9, adult: Secondary | ICD-10-CM

## 2020-09-19 DIAGNOSIS — Z853 Personal history of malignant neoplasm of breast: Secondary | ICD-10-CM | POA: Diagnosis not present

## 2020-09-19 DIAGNOSIS — E669 Obesity, unspecified: Secondary | ICD-10-CM | POA: Diagnosis not present

## 2020-09-19 DIAGNOSIS — R632 Polyphagia: Secondary | ICD-10-CM

## 2020-09-19 NOTE — Telephone Encounter (Signed)
Pt last seen by Dr. Wallace.  

## 2020-09-25 NOTE — Progress Notes (Signed)
Chief Complaint:   OBESITY Alicia Hahn is here to discuss her progress with her obesity treatment plan along with follow-up of her obesity related diagnoses.   Today's visit was #: 9 Starting weight: 171 lbs Starting date: 03/29/2020 Today's weight: 140 lbs Today's date: 09/19/2020 Weight change since last visit: 3 lbs Total lbs lost to date: 31 lbs Body mass index is 24.8 kg/m.  Total weight loss percentage to date: -18.13%  Current Meal Plan: the Category 1 Plan for 90% of the time.  Current Exercise Plan: Cardio for 20 minutes 4 times per week. Current Anti-Obesity Medications: phentermine 8 mg daily and topiramate 25 mg twice daily. Side effects: None.  Interim History:  Alicia Hahn says she is happy at her goal weight.  She would like to discontinue Qsymia and increase calories to maintenance.   Assessment/Plan:   1. Polyphagia Controlled. Current treatment: phentermine 8 mg daily and topiramate 25 mg twice daily. She will continue to focus on protein-rich, low simple carbohydrate foods. We reviewed the importance of hydration, regular exercise for stress reduction, and restorative sleep.  Plan:  Discontinue phentermine and topiramate.   2. Other hyperlipidemia Course: At goal. Lipid-lowering medications: Alicia Hahn 20 mg daily.   Plan: Dietary changes: Increase soluble fiber, decrease simple carbohydrates, decrease saturated fat. Exercise changes: Moderate to vigorous-intensity aerobic activity 150 minutes per week or as tolerated.   Lab Results  Component Value Date   CHOL 156 04/12/2020   HDL 53 04/12/2020   LDLCALC 82 04/12/2020   TRIG 119 04/12/2020   CHOLHDL 2.9 04/12/2020   Lab Results  Component Value Date   ALT 25 05/25/2020   AST 23 05/25/2020   ALKPHOS 97 05/25/2020   BILITOT 0.3 05/25/2020   The 10-year ASCVD risk score Alicia Bussing DC Jr., et al., 2013) is: 3.1%   Values used to calculate the score:     Age: 60 years     Sex: Female     Is Non-Hispanic African  American: No     Diabetic: No     Tobacco smoker: No     Systolic Blood Pressure: 123456 mmHg     Is BP treated: No     HDL Cholesterol: 53 mg/dL     Total Cholesterol: 156 mg/dL  3. History of breast cancer Alicia Hahn takes Femara 2.5 mg daily.  She will continue treatment.  4. Obesity, current BMI 24.9  Course: Alicia Hahn is currently in the action stage of change. As such, her goal is to continue with weight loss efforts.   Nutrition goals: She has agreed to practicing portion control and making smarter food choices, such as increasing vegetables and decreasing simple carbohydrates.   Exercise goals:  As is.  Behavioral modification strategies: planning for success.  Alicia Hahn has agreed to follow-up with our clinic as needed. She was informed of the importance of frequent follow-up visits to maximize her success with intensive lifestyle modifications for her multiple health conditions.   Objective:   Blood pressure 120/80, pulse 87, temperature 98.4 F (36.9 C), temperature source Oral, height '5\' 3"'$  (1.6 m), weight 140 lb (63.5 kg), SpO2 100 %. Body mass index is 24.8 kg/m.  General: Cooperative, alert, well developed, in no acute distress. HEENT: Conjunctivae and lids unremarkable. Cardiovascular: Regular rhythm.  Lungs: Normal work of breathing. Neurologic: No focal deficits.   Lab Results  Component Value Date   CREATININE 0.91 05/25/2020   BUN 17 05/25/2020   NA 141 05/25/2020   K 4.2 05/25/2020  CL 108 05/25/2020   CO2 22 05/25/2020   Lab Results  Component Value Date   ALT 25 05/25/2020   AST 23 05/25/2020   ALKPHOS 97 05/25/2020   BILITOT 0.3 05/25/2020   Lab Results  Component Value Date   HGBA1C 5.5 02/21/2020   HGBA1C 5.1 02/02/2019   Lab Results  Component Value Date   INSULIN 6.8 03/29/2020   Lab Results  Component Value Date   TSH 2.200 02/21/2020   Lab Results  Component Value Date   CHOL 156 04/12/2020   HDL 53 04/12/2020   LDLCALC 82 04/12/2020    TRIG 119 04/12/2020   CHOLHDL 2.9 04/12/2020   Lab Results  Component Value Date   VD25OH 144.0 (H) 03/29/2020   VD25OH 76.8 02/02/2019   Lab Results  Component Value Date   WBC 4.0 05/25/2020   HGB 14.7 05/25/2020   HCT 42.5 05/25/2020   MCV 89.3 05/25/2020   PLT 188 05/25/2020   Lab Results  Component Value Date   IRON 93 03/29/2020   TIBC 352 03/29/2020   FERRITIN 163 (H) 03/29/2020   Attestation Statements:   Reviewed by clinician on day of visit: allergies, medications, problem list, medical history, surgical history, family history, social history, and previous encounter notes.  I, Water quality scientist, CMA, am acting as transcriptionist for Briscoe Deutscher, DO  I have reviewed the above documentation for accuracy and completeness, and I agree with the above. Briscoe Deutscher, DO

## 2020-09-26 IMAGING — MG MM BREAST LOCALIZATION CLIP
4 series · 6 of 12 positions shown · non-contrast
Comparison: Previous exam(s).
COMPARISON: Previous exam(s).

Addendum:
CLINICAL DATA: 58-year-old female for tissue sampling of 0.9 cm
UPPER-OUTER RIGHT breast mass.

EXAM:
ULTRASOUND GUIDED RIGHT BREAST CORE NEEDLE BIOPSY
3D RIGHT MAMMOGRAM POST ULTRASOUND BIOPSY

[R ML synth-2D]
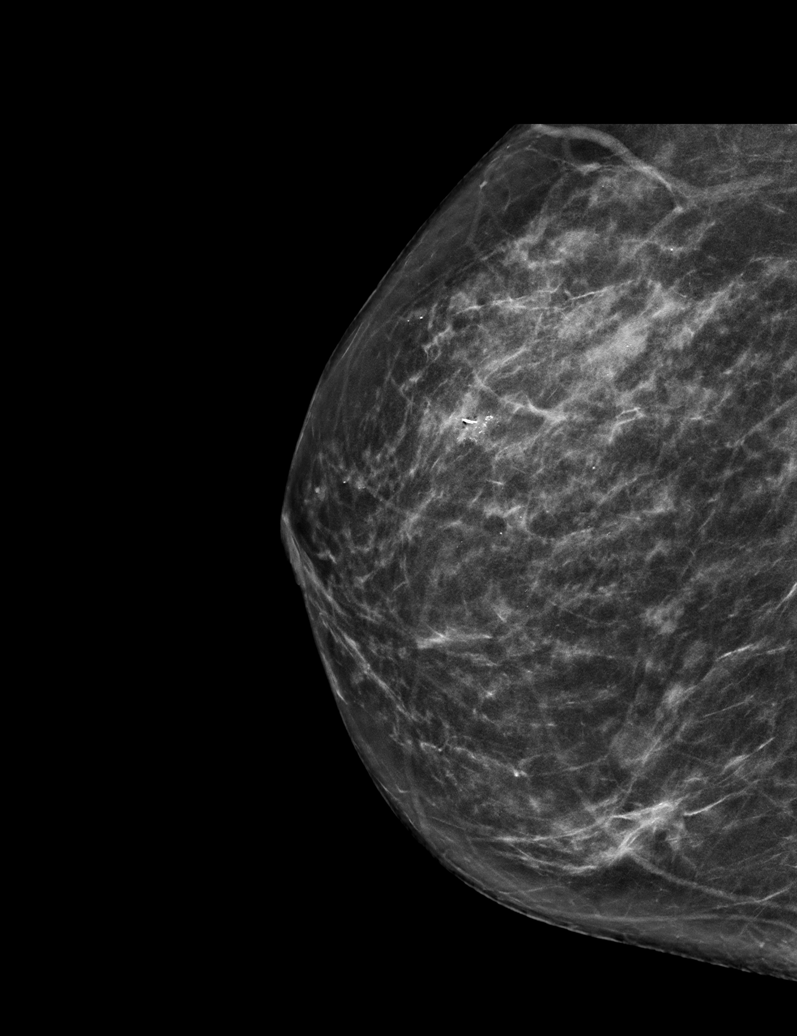

[R CC synth-2D]
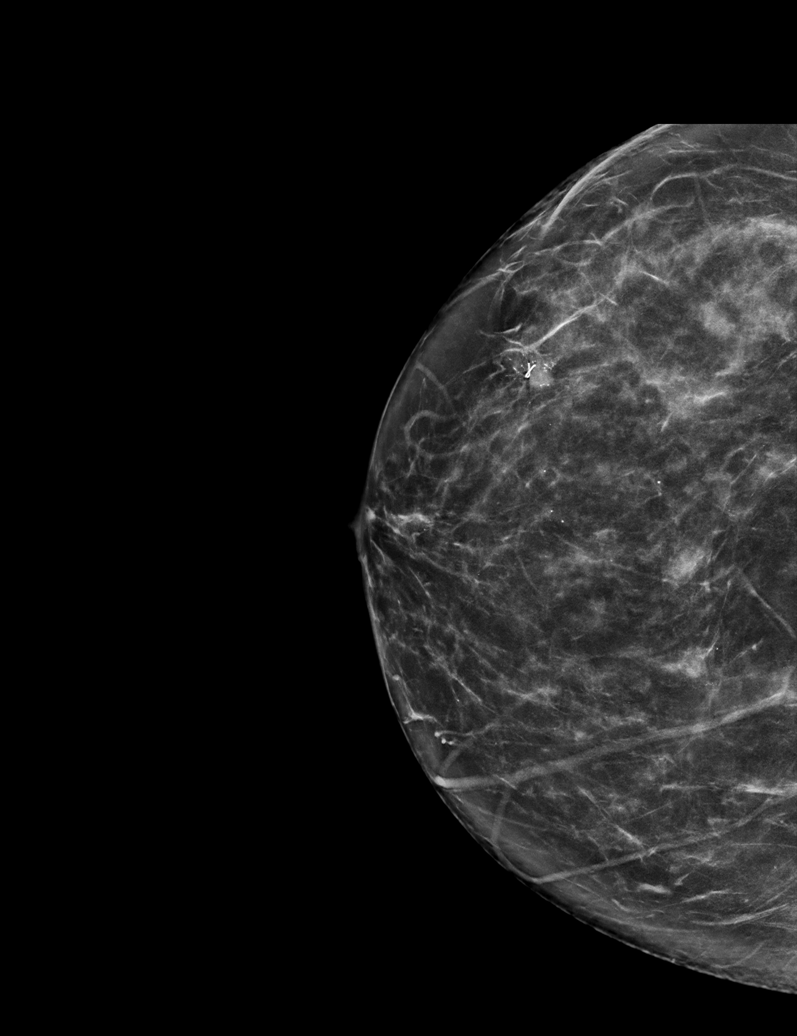

[R ML tomo · 3 of 68 frames shown]
[frame 22/68]
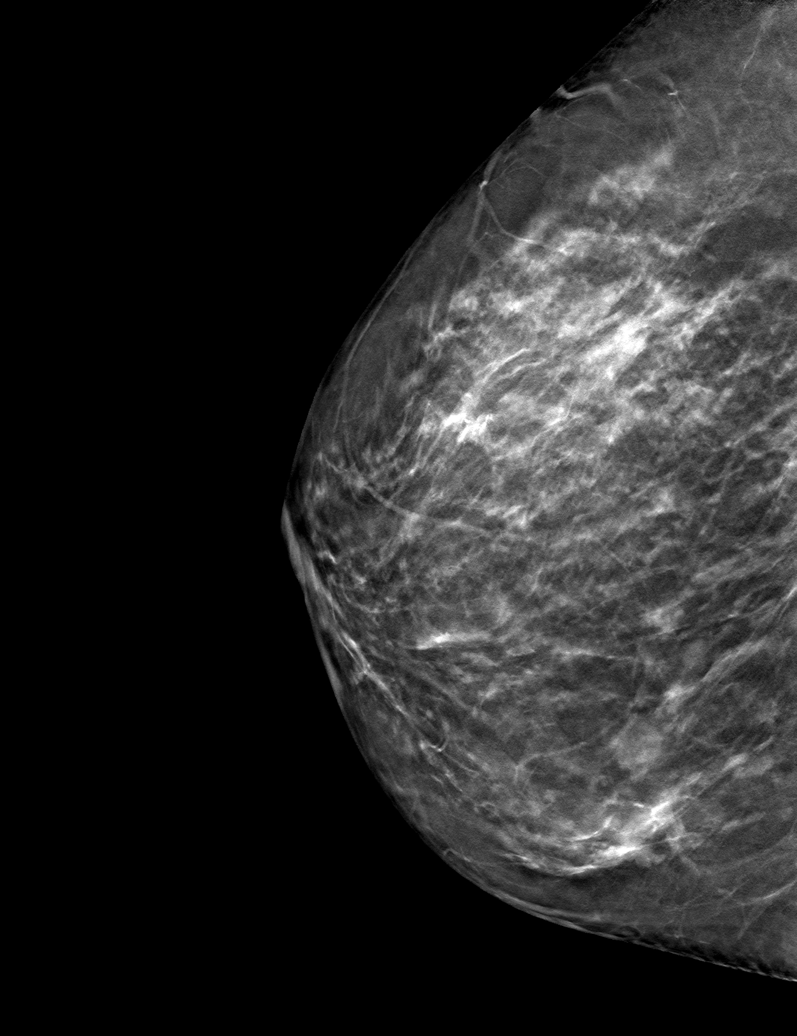
[frame 35/68]
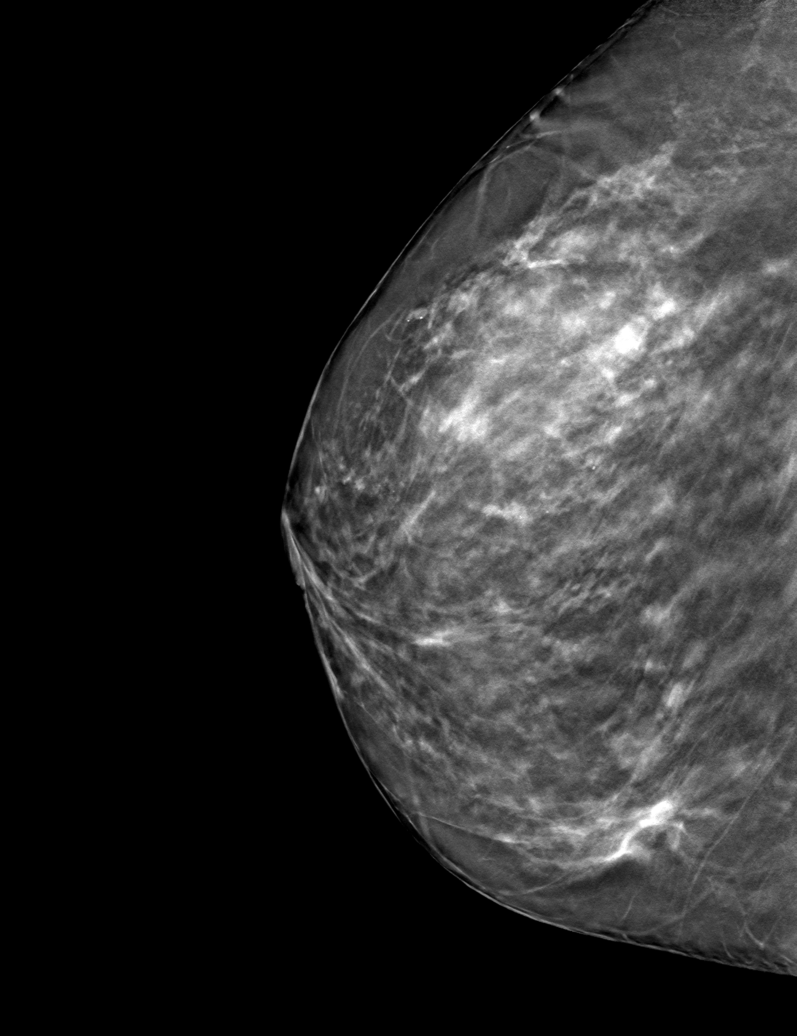
[frame 47/68]
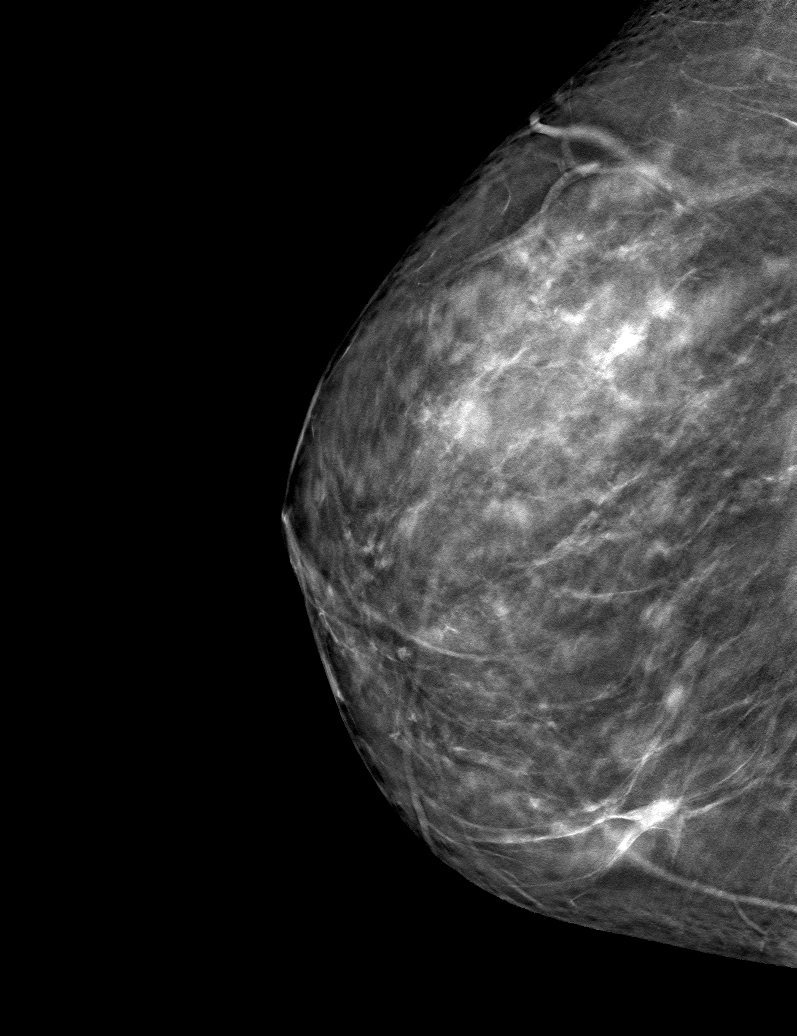

[R CC tomo · tomo slice 37/74.0]
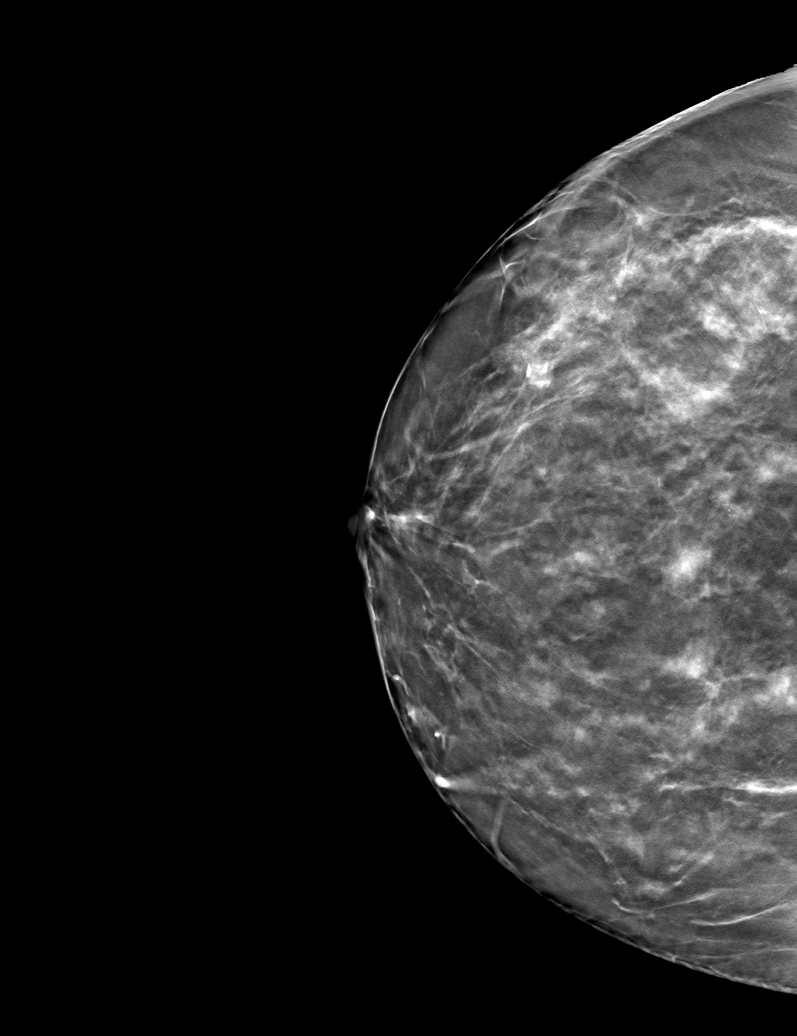

[6 of 12 positions shown; findings below may reference images not displayed]



Lesion quadrant: UPPER-OUTER RIGHT breast

Using sterile technique and 1% Lidocaine as local anesthetic, under
direct ultrasound visualization, a 12 gauge Arnous device was
used to perform biopsy of the 0.9 cm mass at the 11 o'clock position
of the RIGHT breast 2 cm from the nipple using a LATERAL approach.
At the conclusion of the procedure RIBBON tissue marker clip was
deployed into the biopsy cavity.

Follow up 2 view mammogram was performed demonstrating the RIBBON
clip to be in satisfactory position at the expected site of biopsy
within the UPPER-OUTER RIGHT breast and corresponding to the
mammographic mass.
IMPRESSION: Ultrasound guided biopsy of 0.9 cm UPPER-OUTER RIGHT breast mass. No
apparent complications.

Satisfactory position of RIBBON clip following ultrasound-guided
RIGHT breast biopsy.

ADDENDUM:
Pathology revealed GRADE II INVASIVE DUCTAL CARCINOMA, DUCTAL
CARCINOMA IN SITU WITH NECROSIS of the RIGHT breast, 11 o'clock
position. This was found to be concordant by Dr. Jerys Gusman.

Pathology results were discussed with the patient by telephone. The
patient reported doing well after the biopsy with tenderness at the
site. Post biopsy instructions and care were reviewed and questions
were answered. The patient was encouraged to call The [REDACTED]

Recommendation:

Consider staging breast MRI, in part to further assess the
questionable lesion in the inferior aspect of the RIGHT breast.

The patient was referred to [REDACTED]
[REDACTED] at [REDACTED] on
December 29, 2018.

Pathology results reported by Baruhuke Devote, RN on 12/24/2018.



Lesion quadrant: UPPER-OUTER RIGHT breast

Using sterile technique and 1% Lidocaine as local anesthetic, under
direct ultrasound visualization, a 12 gauge Arnous device was
used to perform biopsy of the 0.9 cm mass at the 11 o'clock position
of the RIGHT breast 2 cm from the nipple using a LATERAL approach.
At the conclusion of the procedure RIBBON tissue marker clip was
deployed into the biopsy cavity.

Follow up 2 view mammogram was performed demonstrating the RIBBON
clip to be in satisfactory position at the expected site of biopsy
within the UPPER-OUTER RIGHT breast and corresponding to the
mammographic mass.
IMPRESSION: Ultrasound guided biopsy of 0.9 cm UPPER-OUTER RIGHT breast mass. No
apparent complications.

Satisfactory position of RIBBON clip following ultrasound-guided
RIGHT breast biopsy.

## 2020-10-15 ENCOUNTER — Other Ambulatory Visit: Payer: Self-pay | Admitting: Physician Assistant

## 2020-10-15 DIAGNOSIS — E785 Hyperlipidemia, unspecified: Secondary | ICD-10-CM

## 2020-10-31 IMAGING — DX MM BREAST SURGICAL SPECIMEN
1 series · 2 of 2 positions shown · non-contrast
Comparison: Previous exam(s).

CLINICAL DATA: Radioactive seed localization was performed of
biopsy-proven malignancy in the right breast.

EXAM:
SPECIMEN RADIOGRAPH OF THE RIGHT BREAST

[Series 2: specimen digital x-ray, derived · right · 0.10mm/px · 2 of 2 slices shown]
[im 1/2]
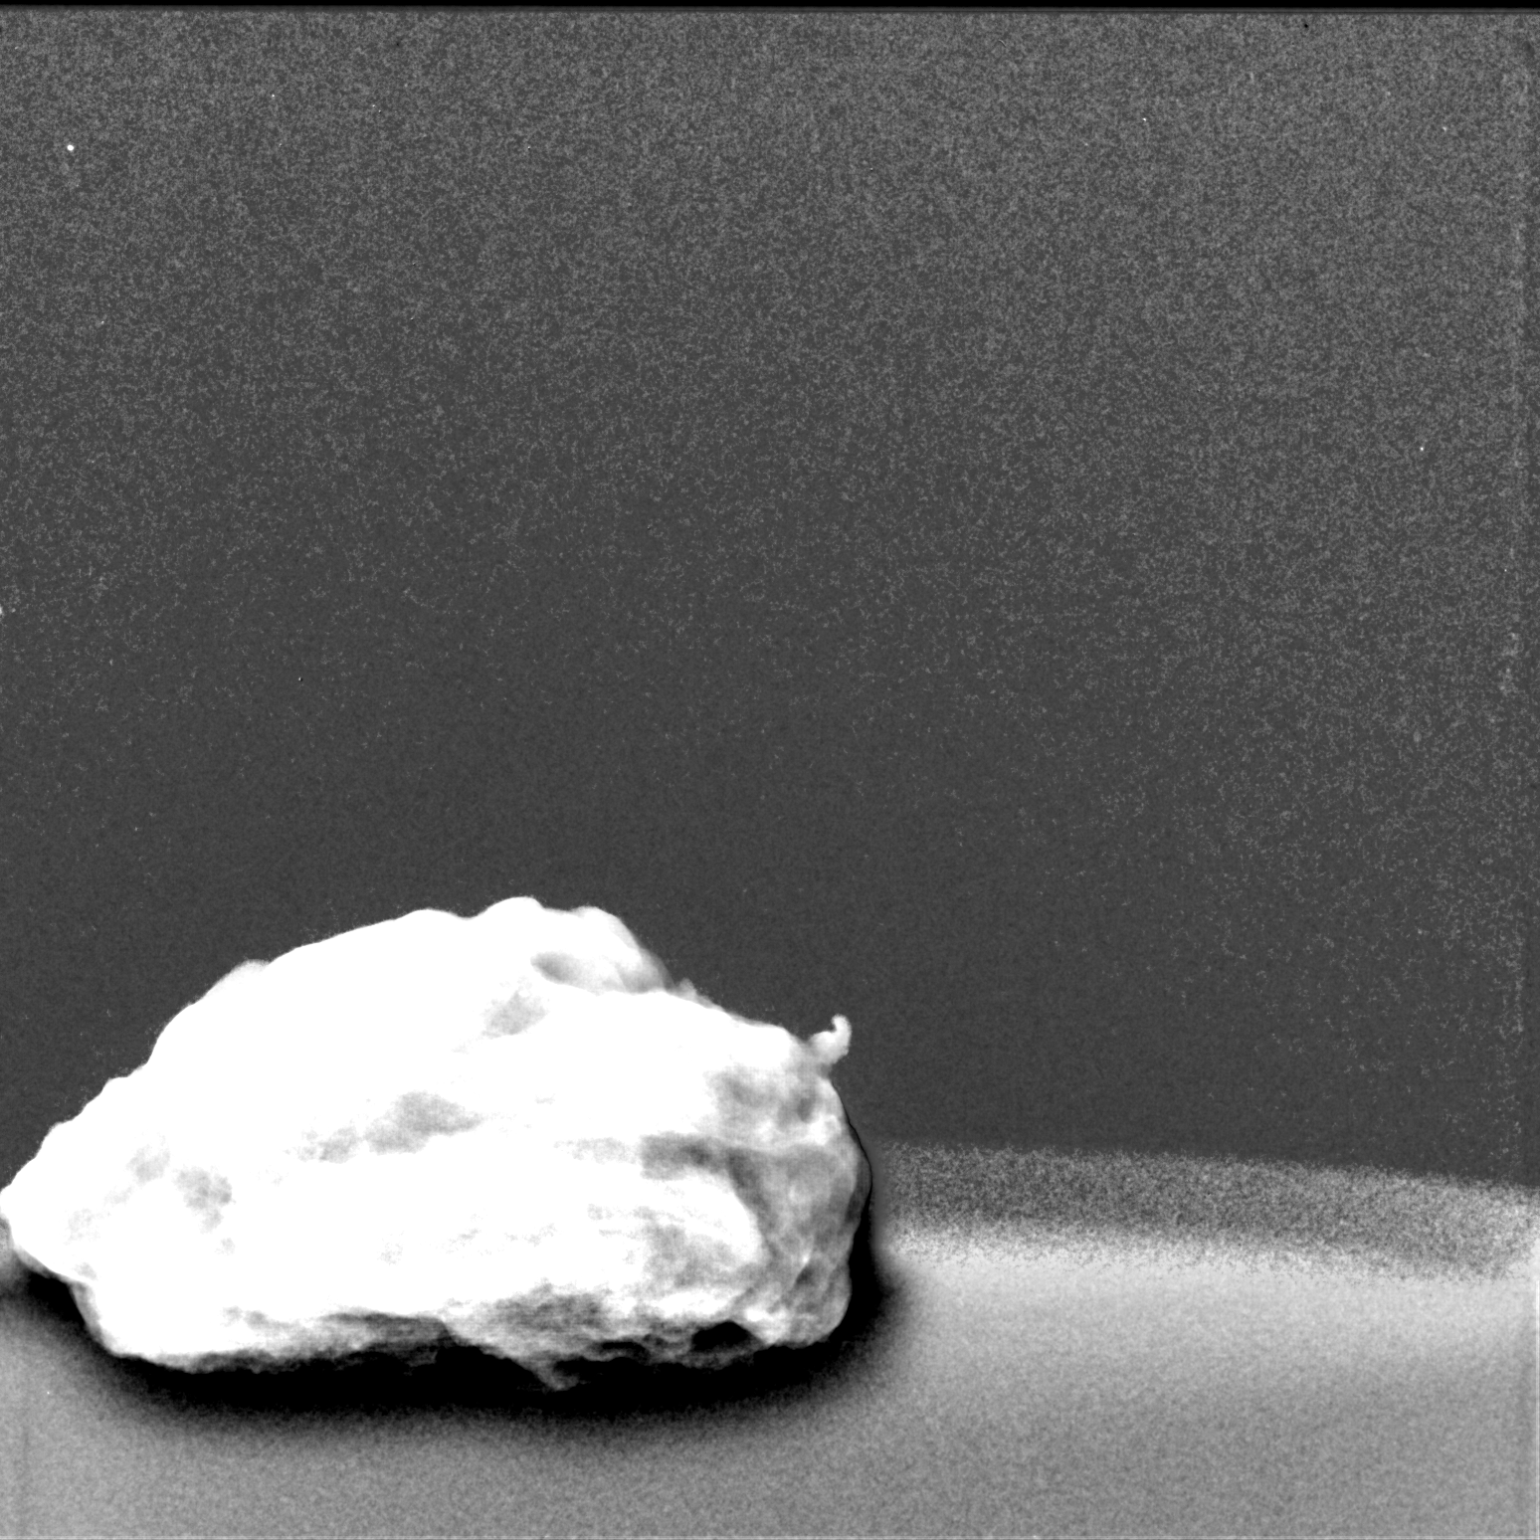
[im 2/2]
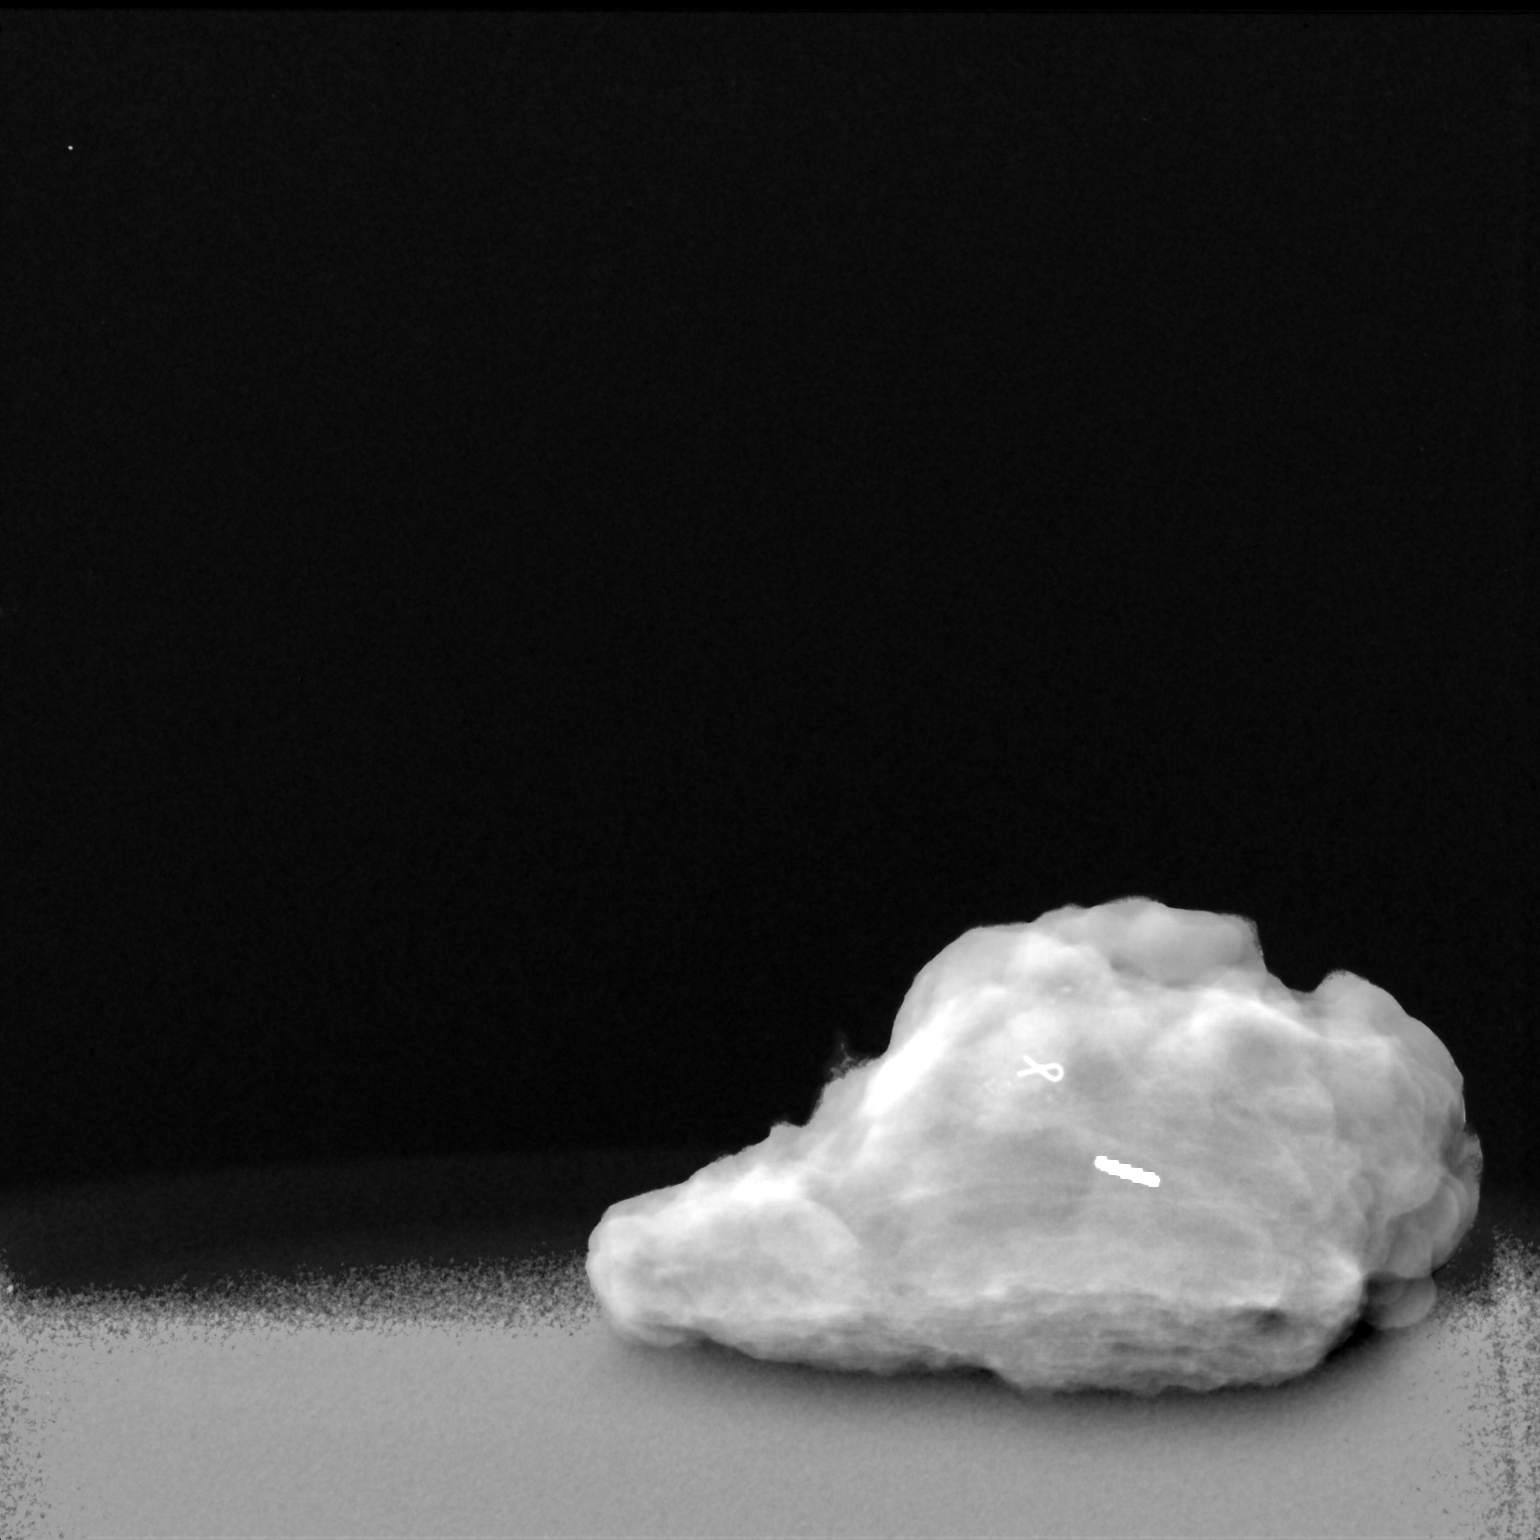

[2 of 2 positions shown; findings below may reference images not displayed]

FINDINGS: Status post excision of the right breast. The radioactive seed and
biopsy marker clip are present and completely intact.
IMPRESSION: Specimen radiograph of the right breast.

## 2020-10-31 IMAGING — CR DG CHEST 1V PORT
1 series · 1 of 1 positions shown · non-contrast
Comparison: Chest x-ray dated June 12, 2003.

CLINICAL DATA: Port placement.

EXAM:
PORTABLE CHEST 1 VIEW

[chest ap]
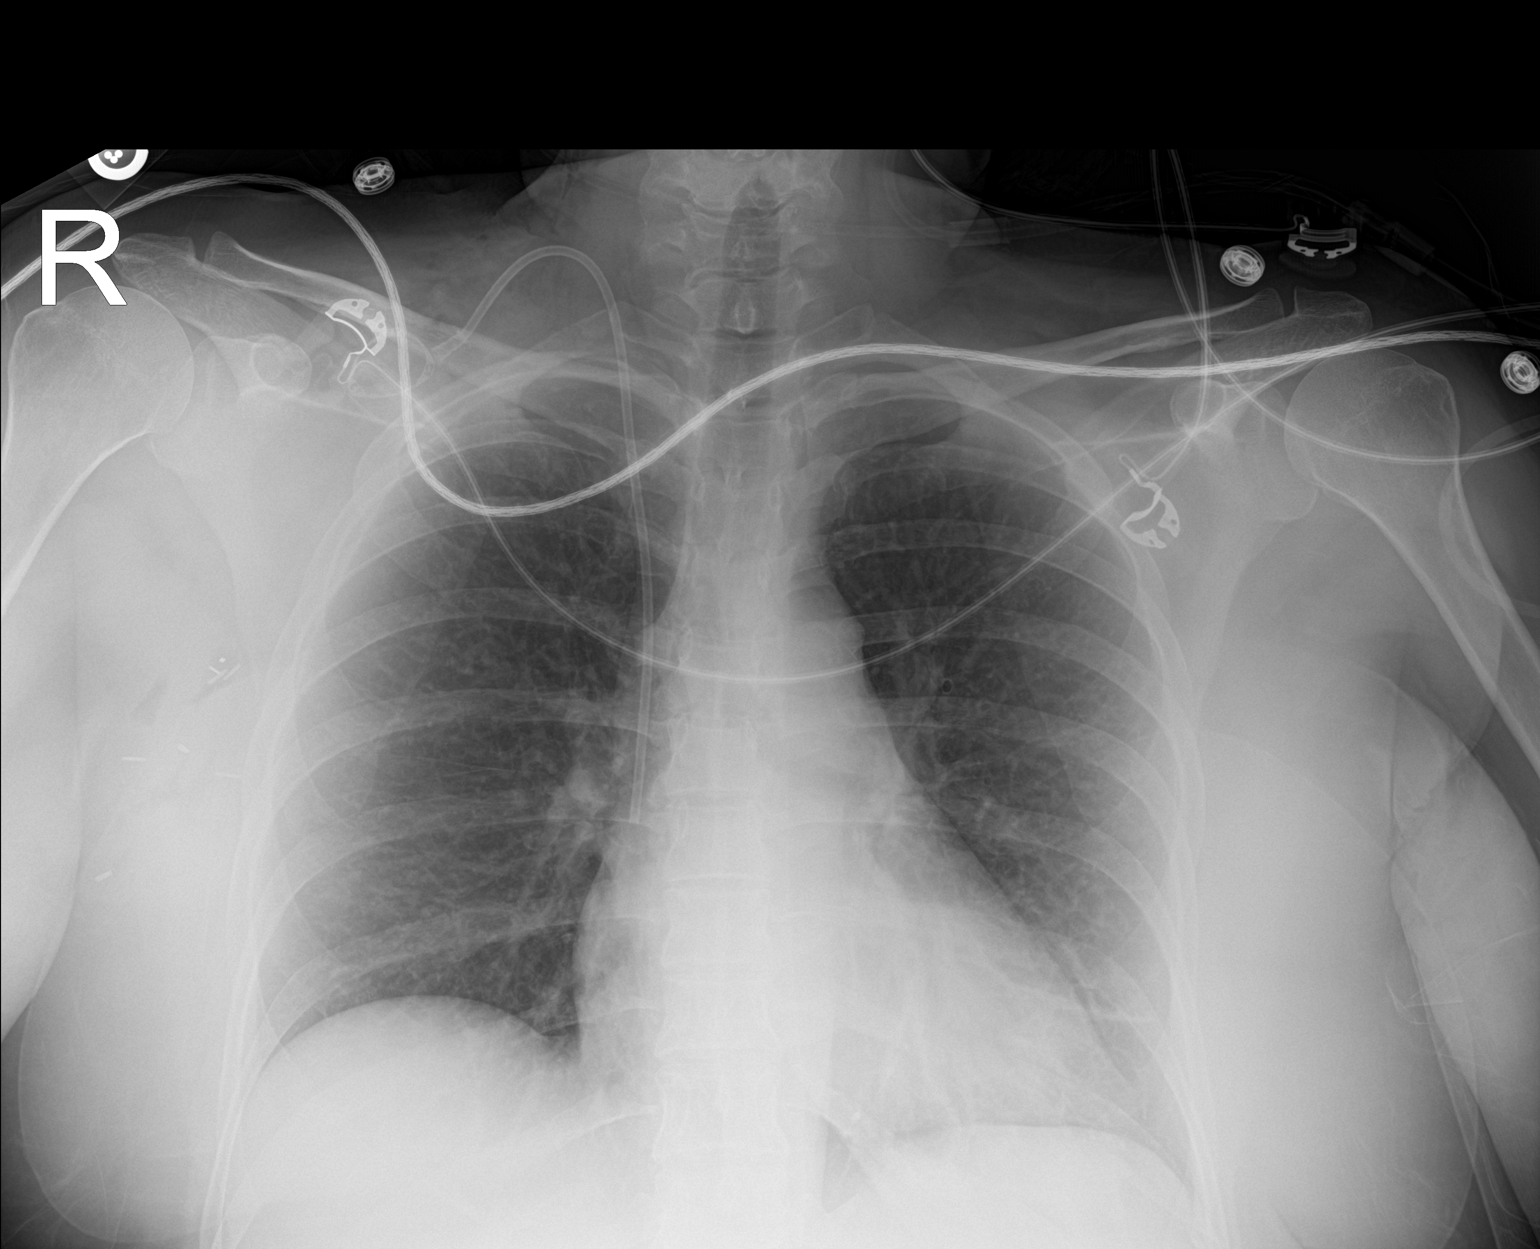

[1 of 1 positions shown; findings below may reference images not displayed]

FINDINGS: Right chest wall port catheter with tip at the cavoatrial junction.
The heart size and mediastinal contours are within normal limits.
Normal pulmonary vascularity. No focal consolidation, pleural
effusion, or pneumothorax. No acute osseous abnormality.
Postsurgical clips and subcutaneous emphysema in the right axilla.
Small amount of subcutaneous emphysema in the right neck.
IMPRESSION: 1. Right chest wall port catheter without complicating feature.
2. No active disease.

## 2020-11-05 DIAGNOSIS — M79642 Pain in left hand: Secondary | ICD-10-CM | POA: Insufficient documentation

## 2020-11-06 ENCOUNTER — Encounter: Payer: Self-pay | Admitting: Physician Assistant

## 2020-11-06 ENCOUNTER — Other Ambulatory Visit: Payer: Self-pay

## 2020-11-06 ENCOUNTER — Ambulatory Visit (INDEPENDENT_AMBULATORY_CARE_PROVIDER_SITE_OTHER): Payer: 59 | Admitting: Physician Assistant

## 2020-11-06 VITALS — BP 135/86 | HR 73 | Ht 63.0 in | Wt 147.0 lb

## 2020-11-06 DIAGNOSIS — N6489 Other specified disorders of breast: Secondary | ICD-10-CM

## 2020-11-06 MED ORDER — HYDROCODONE-ACETAMINOPHEN 5-325 MG PO TABS: 1.0000 | ORAL_TABLET | Freq: Four times a day (QID) | ORAL | 0 refills | Status: AC | PRN

## 2020-11-06 MED ORDER — ONDANSETRON 4 MG PO TBDP: 4.0000 mg | ORAL_TABLET | Freq: Three times a day (TID) | ORAL | 0 refills | Status: DC | PRN

## 2020-11-06 MED ORDER — DIAZEPAM 2 MG PO TABS: 2.0000 mg | ORAL_TABLET | Freq: Two times a day (BID) | ORAL | 0 refills | Status: DC | PRN

## 2020-11-06 MED ORDER — CEPHALEXIN 500 MG PO CAPS: 500.0000 mg | ORAL_CAPSULE | Freq: Four times a day (QID) | ORAL | 0 refills | Status: AC

## 2020-11-06 NOTE — H&P (View-Only) (Signed)
Patient ID: Alicia Hahn, female    DOB: Oct 12, 1960, 60 y.o.   MRN: [id number]  Chief Complaint  Patient presents with   Pre-op Exam      ICD-10-CM   1. Postoperative breast asymmetry  N64.89        History of Present Illness: Alicia Hahn is a 60 y.o.  female  with a history of right-sided breast cancer s/p partial mastectomy and reconstruction.  She presents for preoperative evaluation for upcoming procedure, implant change and mastopexy, scheduled for 11/15/2020 with Dr. [name].  The patient has not had problems with anesthesia.  Patient reports that she had her implants placed approximately 20 years ago and had her breast cancer lumpectomy/partial mastectomy with subsequent radiation in the past few years.  She states that she is now on Femara, aromatase inhibitor.  She reports a recent UCL strain, left hand, after tripping.  No significant trauma.  No fractures.  Mother had provoked DVT in context of pregnancy and immobilization.  Patient has personal history of a single varicose vein left lower extremity.  Denies smoking or diabetes.  No personal history of thrombosis.  She reminds me that she would prefer to stick with saline implants rather than silicone.  Summary of Previous Visit: Patient was seen for initial consult on 09/07/2020 by Dr. [name].  At that time, it was discussed that she had original implants placed by Dr. Luvenia Heller.  She believes that they are 480 cc submuscular saline implants.  STN was noted to be 27 cm on the right, 30 cm on the left.  She believes that she is a DD cup.  She is interested in exchange for smaller saline implant.  She would also like mastopexy to help with the asymmetry.  Job: Previously a Marine scientist, now retired.  PMH Significant for: Right-sided breast cancer s/p radiation and reconstruction, hyperlipidemia.   Past Medical History: Allergies: No Known Allergies  Current Medications:  Current Outpatient Medications:     CALCIUM PO, Take 1,000 mg by mouth daily., Disp: , Rfl:    cephALEXin (KEFLEX) 500 MG capsule, Take 1 capsule (500 mg total) by mouth 4 (four) times daily for 3 days., Disp: 12 capsule, Rfl: 0   diazepam (VALIUM) 2 MG tablet, Take 1 tablet (2 mg total) by mouth every 12 (twelve) hours as needed for muscle spasms., Disp: 20 tablet, Rfl: 0   HYDROcodone-acetaminophen (NORCO) 5-325 MG tablet, Take 1 tablet by mouth every 6 (six) hours as needed for up to 5 days for severe pain., Disp: 20 tablet, Rfl: 0   letrozole (FEMARA) 2.5 MG tablet, Take 1 tablet (2.5 mg total) by mouth daily., Disp: 90 tablet, Rfl: 3   Magnesium 125 MG CAPS, Take 1 capsule by mouth daily. , Disp: , Rfl:    ondansetron (ZOFRAN ODT) 4 MG disintegrating tablet, Take 1 tablet (4 mg total) by mouth every 8 (eight) hours as needed for nausea or vomiting., Disp: 20 tablet, Rfl: 0   rosuvastatin (CRESTOR) 20 MG tablet, Take 1 tablet (20 mg total) by mouth daily. **PLEASE CONTACT OUR OFFICE TO SCHEDULE A FOLLOW UP FOR FUTURE MED REFILLS**, Disp: 90 tablet, Rfl: 0   venlafaxine XR (EFFEXOR-XR) 37.5 MG 24 hr capsule, TAKE 2 CAPSULES BY MOUTH  DAILY WITH BREAKFAST, Disp: 60 capsule, Rfl: 11  Past Medical Problems: Past Medical History:  Diagnosis Date   Acid reflux    occasionally, and takes pepcid OTC   Anxiety    Breast cancer ([place])  Cancer ([place])    breast cancer   Gastric ulcer    GERD (gastroesophageal reflux disease)    Hyperlipidemia    Joint pain    Kidney stones    Lower extremity edema    Personal history of chemotherapy    Personal history of radiation therapy    Stomach ulcer    Vitamin D deficiency     Past Surgical History: Past Surgical History:  Procedure Laterality Date   AUGMENTATION MAMMAPLASTY     BREAST LUMPECTOMY     BREAST LUMPECTOMY WITH RADIOACTIVE SEED AND SENTINEL LYMPH NODE BIOPSY Right 01/27/2019   Procedure: RIGHT BREAST LUMPECTOMY WITH RADIOACTIVE SEED AND SENTINEL LYMPH NODE MAPPING;   Surgeon: Erroll Luna, MD;  Location: [place] [facility];  Service: General;  Laterality: Right;   COSMETIC SURGERY Bilateral 2000   breast aug/ lipo   PORT-A-CATH REMOVAL Right 04/05/2020   Procedure: REMOVAL PORT-A-CATH;  Surgeon: Erroll Luna, MD;  Location: [place];  Service: General;  Laterality: Right;   PORTACATH PLACEMENT Right 01/27/2019   Procedure: INSERTION PORT-A-CATH WITH ULTRASOUND;  Surgeon: Erroll Luna, MD;  Location: [place];  Service: General;  Laterality: Right;    Social History: Social History   Socioeconomic History   Marital status: Married    Spouse name: Not on file   Number of children: 2   Years of education: Not on file   Highest education level: Not on file  Occupational History   Occupation: retired  Tobacco Use   Smoking status: Former    Packs/day: 0.25    Years: 5.00    Pack years: 1.25    Types: Cigarettes    Quit date: 01/20/1978    Years since quitting: 42.8   Smokeless tobacco: Never   Tobacco comments:    1982  Vaping Use   Vaping Use: Never used  Substance and Sexual Activity   Alcohol use: Yes    Alcohol/week: 6.0 standard drinks    Types: 6 Glasses of wine per week    Comment: occasional   Drug use: No   Sexual activity: Yes    Birth control/protection: None  Other Topics Concern   Not on file  Social History Narrative   Not on file   Social Determinants of Health   Financial Resource Strain: Not on file  Food Insecurity: Not on file  Transportation Needs: Not on file  Physical Activity: Not on file  Stress: Not on file  Social Connections: Not on file  Intimate Partner Violence: Not on file    Family History: Family History  Problem Relation Age of Onset   Diabetes Mother    Heart disease Mother    Hyperlipidemia Mother    Breast cancer Mother 61   Cancer Mother        breast DCIS   Cancer Father 61       prostate cancer    Diabetes Father    Hypertension  Father    Hyperlipidemia Father    Heart disease Father    Breast cancer Sister 104   Cancer Sister 21       breast cancer    Cancer Maternal Aunt 55       breast cancer    Cancer Maternal Grandfather        colon cancer   Colon cancer Maternal Grandfather    Esophageal cancer Neg Hx    Rectal cancer Neg Hx    Stomach cancer Neg Hx  Review of Systems: ROS No recent illness or infection  Physical Exam: Vital Signs BP 135/86 (BP Location: Left Arm, Patient Position: Sitting, Cuff Size: Normal)   Pulse 73   Ht 5\' 3"  (1.6 m)   Wt 147 lb (66.7 kg)   SpO2 99%   BMI 26.04 kg/m   Physical Exam Constitutional:      General: Not in acute distress.    Appearance: Normal appearance. Not ill-appearing.  HENT:     Head: Normocephalic and atraumatic.  Eyes:     Pupils: Pupils are equal, round Neck:     Musculoskeletal: Normal range of motion.  Cardiovascular:     Rate and Rhythm: Normal rate    Pulses: Normal pulses.  Pulmonary:     Effort: Pulmonary effort is normal. No respiratory distress.  Abdominal:     General: Abdomen is flat. There is no distension.  Musculoskeletal: Normal range of motion.  No lower extremity swelling or edema.  Single varicosity noted over left lower leg.  Peripheral pulses intact.  Ambulatory. Skin:    General: Skin is warm and dry.  Noninfected discrete subdermal lesion, suspect cyst, over superior aspect of right breast.    Findings: No erythema or rash.  Neurological:     General: No focal deficit present.     Mental Status: Alert and oriented to person, place, and time. Mental status is at baseline.     Motor: No weakness.  Psychiatric:        Mood and Affect: Mood normal.        Behavior: Behavior normal.    Assessment/Plan: The patient is scheduled for implant exchange and mastopexy 11/15/2020 with Dr. [name].  Risks, benefits, and alternatives of procedure discussed, questions answered and consent obtained.    Smoking Status:  Non-smoker. Last Mammogram: 05/2020; Results: Negative, BI-RADS Category 2  Caprini Score: 11, highest risk group; Risk Factors include: Age, previous malignancy, family history of thrombosis, varicosity, BMI greater than 25, and length of planned surgery. Recommendation for mechanical and likely pharmacological prophylaxis.  Will discuss with surgeon.  Encourage early ambulation.   Pictures obtained: Today.  Post-op Rx sent to pharmacy: Norco, Zofran, Keflex, Valium.  Patient was provided with the General Surgical Risk consent document and Pain Medication Agreement prior to their appointment.  They had adequate time to read through the risk consent documents and Pain Medication Agreement. We also discussed them in person together during this preop appointment. All of their questions were answered to their satisfaction.  Recommended calling if they have any further questions.  Risk consent form and Pain Medication Agreement to be scanned into patient's chart.  The risks that can be encountered with and after placement of a breast implant placement were discussed and include the following but not limited to these: bleeding, infection, delayed healing, anesthesia risks, skin sensation changes, injury to structures including nerves, blood vessels, and muscles which may be temporary or permanent, allergies to tape, suture materials and glues, blood products, topical preparations or injected agents, skin contour irregularities, skin discoloration and swelling, deep vein thrombosis, cardiac and pulmonary complications, pain, which may persist, fluid accumulation, wrinkling of the skin over the implant, changes in nipple or breast sensation, implant leakage or rupture, faulty position of the implant, persistent pain, formation of tight scar tissue around the implant (capsular contracture), possible need for revisional surgery or staged procedures.  The risk that can be encountered with mastopexy/breast lift  were discussed and include the following but not limited to  these:  Breast asymmetry, fluid accumulation, firmness of the breast, inability to breast feed, loss of nipple or areola, skin loss, decrease or no nipple sensation, fat necrosis of the breast tissue, bleeding, infection, healing delay.  There are risks of anesthesia, changes to skin sensation and injury to nerves or blood vessels.  The muscle can be temporarily or permanently injured.  You may have an allergic reaction to tape, suture, glue, blood products which can result in skin discoloration, swelling, pain, skin lesions, poor healing.  Any of these can lead to the need for revisonal surgery or stage procedures.  A mastopexy has potential to interfere with diagnostic procedures.  Nipple or breast piercing can increase risks of infection.  This procedure is best done when the breast is fully developed.  Changes in the breast will continue to occur over time.  Pregnancy can alter the outcomes of previous breast surgery, weight gain and weigh loss can also effect the long term appearance.    Electronically signed by: Krista Blue, PA-C 11/06/2020 4:33 PM

## 2020-11-06 NOTE — Progress Notes (Addendum)
Patient ID: Alicia Hahn, female    DOB: 10-Aug-1960, 60 y.o.   MRN: [id number]  Chief Complaint  Patient presents with   Pre-op Exam      ICD-10-CM   1. Postoperative breast asymmetry  N64.89        History of Present Illness: Alicia Hahn is a 60 y.o.  female  with a history of right-sided breast cancer s/p partial mastectomy and reconstruction.  She presents for preoperative evaluation for upcoming procedure, implant change and mastopexy, scheduled for 11/15/2020 with Dr. [name].  The patient has not had problems with anesthesia.  Patient reports that she had her implants placed approximately 20 years ago and had her breast cancer lumpectomy/partial mastectomy with subsequent radiation in the past few years.  She states that she is now on Femara, aromatase inhibitor.  She reports a recent UCL strain, left hand, after tripping.  No significant trauma.  No fractures.  Mother had provoked DVT in context of pregnancy and immobilization.  Patient has personal history of a single varicose vein left lower extremity.  Denies smoking or diabetes.  No personal history of thrombosis.  She reminds me that she would prefer to stick with saline implants rather than silicone.  Summary of Previous Visit: Patient was seen for initial consult on 09/07/2020 by Dr. [name].  At that time, it was discussed that she had original implants placed by Dr. Luvenia Heller.  She believes that they are 480 cc submuscular saline implants.  STN was noted to be 27 cm on the right, 30 cm on the left.  She believes that she is a DD cup.  She is interested in exchange for smaller saline implant.  She would also like mastopexy to help with the asymmetry.  Job: Previously a Marine scientist, now retired.  PMH Significant for: Right-sided breast cancer s/p radiation and reconstruction, hyperlipidemia.   Past Medical History: Allergies: No Known Allergies  Current Medications:  Current Outpatient Medications:     CALCIUM PO, Take 1,000 mg by mouth daily., Disp: , Rfl:    cephALEXin (KEFLEX) 500 MG capsule, Take 1 capsule (500 mg total) by mouth 4 (four) times daily for 3 days., Disp: 12 capsule, Rfl: 0   diazepam (VALIUM) 2 MG tablet, Take 1 tablet (2 mg total) by mouth every 12 (twelve) hours as needed for muscle spasms., Disp: 20 tablet, Rfl: 0   HYDROcodone-acetaminophen (NORCO) 5-325 MG tablet, Take 1 tablet by mouth every 6 (six) hours as needed for up to 5 days for severe pain., Disp: 20 tablet, Rfl: 0   letrozole (FEMARA) 2.5 MG tablet, Take 1 tablet (2.5 mg total) by mouth daily., Disp: 90 tablet, Rfl: 3   Magnesium 125 MG CAPS, Take 1 capsule by mouth daily. , Disp: , Rfl:    ondansetron (ZOFRAN ODT) 4 MG disintegrating tablet, Take 1 tablet (4 mg total) by mouth every 8 (eight) hours as needed for nausea or vomiting., Disp: 20 tablet, Rfl: 0   rosuvastatin (CRESTOR) 20 MG tablet, Take 1 tablet (20 mg total) by mouth daily. **PLEASE CONTACT OUR OFFICE TO SCHEDULE A FOLLOW UP FOR FUTURE MED REFILLS**, Disp: 90 tablet, Rfl: 0   venlafaxine XR (EFFEXOR-XR) 37.5 MG 24 hr capsule, TAKE 2 CAPSULES BY MOUTH  DAILY WITH BREAKFAST, Disp: 60 capsule, Rfl: 11  Past Medical Problems: Past Medical History:  Diagnosis Date   Acid reflux    occasionally, and takes pepcid OTC   Anxiety    Breast cancer ([facility] [facility])  Cancer ([place])    breast cancer   Gastric ulcer    GERD (gastroesophageal reflux disease)    Hyperlipidemia    Joint pain    Kidney stones    Lower extremity edema    Personal history of chemotherapy    Personal history of radiation therapy    Stomach ulcer    Vitamin D deficiency     Past Surgical History: Past Surgical History:  Procedure Laterality Date   AUGMENTATION MAMMAPLASTY     BREAST LUMPECTOMY     BREAST LUMPECTOMY WITH RADIOACTIVE SEED AND SENTINEL LYMPH NODE BIOPSY Right 01/27/2019   Procedure: RIGHT BREAST LUMPECTOMY WITH RADIOACTIVE SEED AND SENTINEL LYMPH NODE MAPPING;   Surgeon: Erroll Luna, MD;  Location: [facility];  Service: General;  Laterality: Right;   COSMETIC SURGERY Bilateral 2000   breast aug/ lipo   PORT-A-CATH REMOVAL Right 04/05/2020   Procedure: REMOVAL PORT-A-CATH;  Surgeon: Erroll Luna, MD;  Location: [place];  Service: General;  Laterality: Right;   PORTACATH PLACEMENT Right 01/27/2019   Procedure: INSERTION PORT-A-CATH WITH ULTRASOUND;  Surgeon: Erroll Luna, MD;  Location: [place];  Service: General;  Laterality: Right;    Social History: Social History   Socioeconomic History   Marital status: Married    Spouse name: Not on file   Number of children: 2   Years of education: Not on file   Highest education level: Not on file  Occupational History   Occupation: retired  Tobacco Use   Smoking status: Former    Packs/day: 0.25    Years: 5.00    Pack years: 1.25    Types: Cigarettes    Quit date: 01/20/1978    Years since quitting: 42.8   Smokeless tobacco: Never   Tobacco comments:    1982  Vaping Use   Vaping Use: Never used  Substance and Sexual Activity   Alcohol use: Yes    Alcohol/week: 6.0 standard drinks    Types: 6 Glasses of wine per week    Comment: occasional   Drug use: No   Sexual activity: Yes    Birth control/protection: None  Other Topics Concern   Not on file  Social History Narrative   Not on file   Social Determinants of Health   Financial Resource Strain: Not on file  Food Insecurity: Not on file  Transportation Needs: Not on file  Physical Activity: Not on file  Stress: Not on file  Social Connections: Not on file  Intimate Partner Violence: Not on file    Family History: Family History  Problem Relation Age of Onset   Diabetes Mother    Heart disease Mother    Hyperlipidemia Mother    Breast cancer Mother 63   Cancer Mother        breast DCIS   Cancer Father 39       prostate cancer    Diabetes Father    Hypertension  Father    Hyperlipidemia Father    Heart disease Father    Breast cancer Sister 63   Cancer Sister 48       breast cancer    Cancer Maternal Aunt 55       breast cancer    Cancer Maternal Grandfather        colon cancer   Colon cancer Maternal Grandfather    Esophageal cancer Neg Hx    Rectal cancer Neg Hx    Stomach cancer Neg Hx  Review of Systems: ROS No recent illness or infection  Physical Exam: Vital Signs BP 135/86 (BP Location: Left Arm, Patient Position: Sitting, Cuff Size: Normal)   Pulse 73   Ht 5\' 3"  (1.6 m)   Wt 147 lb (66.7 kg)   SpO2 99%   BMI 26.04 kg/m   Physical Exam Constitutional:      General: Not in acute distress.    Appearance: Normal appearance. Not ill-appearing.  HENT:     Head: Normocephalic and atraumatic.  Eyes:     Pupils: Pupils are equal, round Neck:     Musculoskeletal: Normal range of motion.  Cardiovascular:     Rate and Rhythm: Normal rate    Pulses: Normal pulses.  Pulmonary:     Effort: Pulmonary effort is normal. No respiratory distress.  Abdominal:     General: Abdomen is flat. There is no distension.  Musculoskeletal: Normal range of motion.  No lower extremity swelling or edema.  Single varicosity noted over left lower leg.  Peripheral pulses intact.  Ambulatory. Skin:    General: Skin is warm and dry.  Noninfected discrete subdermal lesion, suspect cyst, over superior aspect of right breast.    Findings: No erythema or rash.  Neurological:     General: No focal deficit present.     Mental Status: Alert and oriented to person, place, and time. Mental status is at baseline.     Motor: No weakness.  Psychiatric:        Mood and Affect: Mood normal.        Behavior: Behavior normal.    Assessment/Plan: The patient is scheduled for implant exchange and mastopexy 11/15/2020 with Dr. [name].  Risks, benefits, and alternatives of procedure discussed, questions answered and consent obtained.    Smoking Status:  Non-smoker. Last Mammogram: 05/2020; Results: Negative, BI-RADS Category 2  Caprini Score: 11, highest risk group; Risk Factors include: Age, previous malignancy, family history of thrombosis, varicosity, BMI greater than 25, and length of planned surgery. Recommendation for mechanical and likely pharmacological prophylaxis.  Will discuss with surgeon.  Encourage early ambulation.   Pictures obtained: Today.  Post-op Rx sent to pharmacy: Norco, Zofran, Keflex, Valium.  Patient was provided with the General Surgical Risk consent document and Pain Medication Agreement prior to their appointment.  They had adequate time to read through the risk consent documents and Pain Medication Agreement. We also discussed them in person together during this preop appointment. All of their questions were answered to their satisfaction.  Recommended calling if they have any further questions.  Risk consent form and Pain Medication Agreement to be scanned into patient's chart.  The risks that can be encountered with and after placement of a breast implant placement were discussed and include the following but not limited to these: bleeding, infection, delayed healing, anesthesia risks, skin sensation changes, injury to structures including nerves, blood vessels, and muscles which may be temporary or permanent, allergies to tape, suture materials and glues, blood products, topical preparations or injected agents, skin contour irregularities, skin discoloration and swelling, deep vein thrombosis, cardiac and pulmonary complications, pain, which may persist, fluid accumulation, wrinkling of the skin over the implant, changes in nipple or breast sensation, implant leakage or rupture, faulty position of the implant, persistent pain, formation of tight scar tissue around the implant (capsular contracture), possible need for revisional surgery or staged procedures.  The risk that can be encountered with mastopexy/breast lift  were discussed and include the following but not limited to  these:  Breast asymmetry, fluid accumulation, firmness of the breast, inability to breast feed, loss of nipple or areola, skin loss, decrease or no nipple sensation, fat necrosis of the breast tissue, bleeding, infection, healing delay.  There are risks of anesthesia, changes to skin sensation and injury to nerves or blood vessels.  The muscle can be temporarily or permanently injured.  You may have an allergic reaction to tape, suture, glue, blood products which can result in skin discoloration, swelling, pain, skin lesions, poor healing.  Any of these can lead to the need for revisonal surgery or stage procedures.  A mastopexy has potential to interfere with diagnostic procedures.  Nipple or breast piercing can increase risks of infection.  This procedure is best done when the breast is fully developed.  Changes in the breast will continue to occur over time.  Pregnancy can alter the outcomes of previous breast surgery, weight gain and weigh loss can also effect the long term appearance.    Electronically signed by: Krista Blue, PA-C 11/06/2020 4:33 PM

## 2020-11-07 ENCOUNTER — Other Ambulatory Visit: Payer: Self-pay

## 2020-11-07 ENCOUNTER — Encounter (HOSPITAL_BASED_OUTPATIENT_CLINIC_OR_DEPARTMENT_OTHER): Payer: Self-pay | Admitting: Plastic Surgery

## 2020-11-15 ENCOUNTER — Encounter (HOSPITAL_BASED_OUTPATIENT_CLINIC_OR_DEPARTMENT_OTHER): Payer: Self-pay | Admitting: Plastic Surgery

## 2020-11-15 ENCOUNTER — Ambulatory Visit (HOSPITAL_BASED_OUTPATIENT_CLINIC_OR_DEPARTMENT_OTHER)
Admission: RE | Admit: 2020-11-15 | Discharge: 2020-11-15 | Disposition: A | Discharge status: Home / Self Care | Payer: 59 | Attending: Plastic Surgery | Admitting: Plastic Surgery

## 2020-11-15 ENCOUNTER — Other Ambulatory Visit: Payer: Self-pay

## 2020-11-15 ENCOUNTER — Ambulatory Visit (HOSPITAL_BASED_OUTPATIENT_CLINIC_OR_DEPARTMENT_OTHER): Payer: 59 | Admitting: Anesthesiology

## 2020-11-15 ENCOUNTER — Encounter (HOSPITAL_BASED_OUTPATIENT_CLINIC_OR_DEPARTMENT_OTHER)
Admission: RE | Disposition: A | Discharge status: Home / Self Care | Payer: Self-pay | Source: Home / Self Care | Attending: Plastic Surgery

## 2020-11-15 ENCOUNTER — Other Ambulatory Visit: Payer: Self-pay | Admitting: Hematology

## 2020-11-15 DIAGNOSIS — Z170 Estrogen receptor positive status [ER+]: Secondary | ICD-10-CM

## 2020-11-15 DIAGNOSIS — Z9011 Acquired absence of right breast and nipple: Secondary | ICD-10-CM

## 2020-11-15 DIAGNOSIS — Z87891 Personal history of nicotine dependence: Secondary | ICD-10-CM | POA: Diagnosis not present

## 2020-11-15 DIAGNOSIS — N6489 Other specified disorders of breast: Secondary | ICD-10-CM

## 2020-11-15 DIAGNOSIS — Z853 Personal history of malignant neoplasm of breast: Secondary | ICD-10-CM | POA: Insufficient documentation

## 2020-11-15 HISTORY — PX: LIPOSUCTION: SHX10

## 2020-11-15 HISTORY — PX: MASTOPEXY: SHX5358

## 2020-11-15 HISTORY — PX: BREAST IMPLANT EXCHANGE: SHX6296

## 2020-11-15 SURGERY — REPLACEMENT, IMPLANT, BREAST
Anesthesia: General | Site: Breast | Laterality: Bilateral

## 2020-11-15 MED ORDER — IODINE STRONG (LUGOLS) 5 % PO SOLN
ORAL | Status: AC
  Filled 2020-11-15: qty 1

## 2020-11-15 MED ORDER — ACETAMINOPHEN 325 MG RE SUPP: 650.0000 mg | RECTAL | Status: DC | PRN

## 2020-11-15 MED ORDER — DIPHENHYDRAMINE HCL 50 MG/ML IJ SOLN
INTRAMUSCULAR | Status: DC | PRN
  Administered 2020-11-15: 6.25 mg via INTRAVENOUS

## 2020-11-15 MED ORDER — SUGAMMADEX SODIUM 200 MG/2ML IV SOLN
INTRAVENOUS | Status: DC | PRN
  Administered 2020-11-15: 200 mg via INTRAVENOUS

## 2020-11-15 MED ORDER — LIDOCAINE HCL (CARDIAC) PF 100 MG/5ML IV SOSY
PREFILLED_SYRINGE | INTRAVENOUS | Status: DC | PRN
Start: 2020-11-15 — End: 2020-11-15
  Administered 2020-11-15: 60 mg via INTRAVENOUS

## 2020-11-15 MED ORDER — HYDROMORPHONE HCL 1 MG/ML IJ SOLN
0.2500 mg | INTRAMUSCULAR | Status: DC | PRN
  Administered 2020-11-15 (×3): 0.5 mg via INTRAVENOUS

## 2020-11-15 MED ORDER — PROMETHAZINE HCL 25 MG/ML IJ SOLN: 6.2500 mg | INTRAMUSCULAR | Status: DC | PRN

## 2020-11-15 MED ORDER — SODIUM CHLORIDE 0.9% FLUSH: 3.0000 mL | INTRAVENOUS | Status: DC | PRN

## 2020-11-15 MED ORDER — LIDOCAINE 2% (20 MG/ML) 5 ML SYRINGE
INTRAMUSCULAR | Status: AC
  Filled 2020-11-15: qty 5

## 2020-11-15 MED ORDER — BUPIVACAINE-EPINEPHRINE (PF) 0.25% -1:200000 IJ SOLN
INTRAMUSCULAR | Status: AC
  Filled 2020-11-15: qty 90

## 2020-11-15 MED ORDER — ACETAMINOPHEN 325 MG PO TABS: 650.0000 mg | ORAL_TABLET | ORAL | Status: DC | PRN

## 2020-11-15 MED ORDER — PROPOFOL 10 MG/ML IV BOLUS
INTRAVENOUS | Status: DC | PRN
  Administered 2020-11-15: 150 mg via INTRAVENOUS

## 2020-11-15 MED ORDER — OXYCODONE HCL 5 MG PO TABS: 5.0000 mg | ORAL_TABLET | ORAL | Status: DC | PRN

## 2020-11-15 MED ORDER — LACTATED RINGERS IV SOLN: INTRAVENOUS | Status: DC

## 2020-11-15 MED ORDER — MORPHINE SULFATE (PF) 4 MG/ML IV SOLN
2.0000 mg | INTRAVENOUS | Status: DC | PRN
Start: 2020-11-15 — End: 2020-11-15

## 2020-11-15 MED ORDER — FENTANYL CITRATE (PF) 100 MCG/2ML IJ SOLN
INTRAMUSCULAR | Status: AC
  Filled 2020-11-15: qty 2

## 2020-11-15 MED ORDER — CHLORHEXIDINE GLUCONATE CLOTH 2 % EX PADS: 6.0000 | MEDICATED_PAD | Freq: Once | CUTANEOUS | Status: DC

## 2020-11-15 MED ORDER — SODIUM CHLORIDE 0.9% FLUSH: 3.0000 mL | Freq: Two times a day (BID) | INTRAVENOUS | Status: DC

## 2020-11-15 MED ORDER — CEFAZOLIN SODIUM-DEXTROSE 2-4 GM/100ML-% IV SOLN
INTRAVENOUS | Status: AC
  Filled 2020-11-15: qty 100

## 2020-11-15 MED ORDER — MIDAZOLAM HCL 2 MG/2ML IJ SOLN
INTRAMUSCULAR | Status: AC
  Filled 2020-11-15: qty 2

## 2020-11-15 MED ORDER — AMISULPRIDE (ANTIEMETIC) 5 MG/2ML IV SOLN: 10.0000 mg | Freq: Once | INTRAVENOUS | Status: DC | PRN

## 2020-11-15 MED ORDER — ROCURONIUM BROMIDE 100 MG/10ML IV SOLN
INTRAVENOUS | Status: DC | PRN
  Administered 2020-11-15: 70 mg via INTRAVENOUS

## 2020-11-15 MED ORDER — ATROPINE SULFATE 0.4 MG/ML IV SOLN
INTRAVENOUS | Status: AC
  Filled 2020-11-15: qty 1

## 2020-11-15 MED ORDER — PHENYLEPHRINE 40 MCG/ML (10ML) SYRINGE FOR IV PUSH (FOR BLOOD PRESSURE SUPPORT)
PREFILLED_SYRINGE | INTRAVENOUS | Status: AC
  Filled 2020-11-15: qty 10

## 2020-11-15 MED ORDER — SODIUM CHLORIDE 0.9 % IV SOLN
INTRAVENOUS | Status: DC | PRN
  Administered 2020-11-15: 500 mL

## 2020-11-15 MED ORDER — MEPERIDINE HCL 25 MG/ML IJ SOLN: 6.2500 mg | INTRAMUSCULAR | Status: DC | PRN

## 2020-11-15 MED ORDER — SUCCINYLCHOLINE CHLORIDE 200 MG/10ML IV SOSY
PREFILLED_SYRINGE | INTRAVENOUS | Status: AC
  Filled 2020-11-15: qty 10

## 2020-11-15 MED ORDER — SODIUM CHLORIDE (PF) 0.9 % IJ SOLN
INTRAMUSCULAR | Status: AC
  Filled 2020-11-15: qty 30

## 2020-11-15 MED ORDER — OXYCODONE HCL 5 MG PO TABS
ORAL_TABLET | ORAL | Status: AC
  Filled 2020-11-15: qty 1

## 2020-11-15 MED ORDER — OXYCODONE HCL 5 MG PO TABS
5.0000 mg | ORAL_TABLET | Freq: Once | ORAL | Status: AC | PRN
  Administered 2020-11-15: 5 mg via ORAL

## 2020-11-15 MED ORDER — LIDOCAINE-EPINEPHRINE 1 %-1:100000 IJ SOLN
INTRAMUSCULAR | Status: AC
  Filled 2020-11-15: qty 3

## 2020-11-15 MED ORDER — ONDANSETRON HCL 4 MG/2ML IJ SOLN
INTRAMUSCULAR | Status: AC
  Filled 2020-11-15: qty 2

## 2020-11-15 MED ORDER — HYDROMORPHONE HCL 1 MG/ML IJ SOLN
INTRAMUSCULAR | Status: AC
  Filled 2020-11-15: qty 0.5

## 2020-11-15 MED ORDER — BUPIVACAINE HCL (PF) 0.25 % IJ SOLN
INTRAMUSCULAR | Status: AC
  Filled 2020-11-15: qty 90

## 2020-11-15 MED ORDER — SODIUM CHLORIDE 0.9 % IV SOLN
INTRAVENOUS | Status: AC
  Filled 2020-11-15 (×2): qty 10

## 2020-11-15 MED ORDER — BUPIVACAINE HCL (PF) 0.25 % IJ SOLN
INTRAMUSCULAR | Status: AC
  Filled 2020-11-15: qty 30

## 2020-11-15 MED ORDER — DEXAMETHASONE SODIUM PHOSPHATE 4 MG/ML IJ SOLN
INTRAMUSCULAR | Status: DC | PRN
  Administered 2020-11-15: 10 mg via INTRAVENOUS

## 2020-11-15 MED ORDER — MIDAZOLAM HCL 5 MG/5ML IJ SOLN
INTRAMUSCULAR | Status: DC | PRN
  Administered 2020-11-15: 2 mg via INTRAVENOUS

## 2020-11-15 MED ORDER — DEXAMETHASONE SODIUM PHOSPHATE 10 MG/ML IJ SOLN
INTRAMUSCULAR | Status: AC
  Filled 2020-11-15: qty 1

## 2020-11-15 MED ORDER — LIDOCAINE-EPINEPHRINE 1 %-1:100000 IJ SOLN
INTRAMUSCULAR | Status: DC | PRN
  Administered 2020-11-15: 17 mL via INTRAMUSCULAR

## 2020-11-15 MED ORDER — OXYCODONE HCL 5 MG/5ML PO SOLN: 5.0000 mg | Freq: Once | ORAL | Status: AC | PRN

## 2020-11-15 MED ORDER — SODIUM CHLORIDE 0.9 % IV SOLN: 250.0000 mL | INTRAVENOUS | Status: DC | PRN

## 2020-11-15 MED ORDER — EPHEDRINE 5 MG/ML INJ
INTRAVENOUS | Status: AC
  Filled 2020-11-15: qty 5

## 2020-11-15 MED ORDER — FENTANYL CITRATE (PF) 100 MCG/2ML IJ SOLN
INTRAMUSCULAR | Status: DC | PRN
  Administered 2020-11-15: 100 ug via INTRAVENOUS
  Administered 2020-11-15 (×2): 50 ug via INTRAVENOUS

## 2020-11-15 MED ORDER — ONDANSETRON HCL 4 MG/2ML IJ SOLN
INTRAMUSCULAR | Status: DC | PRN
  Administered 2020-11-15: 4 mg via INTRAVENOUS

## 2020-11-15 MED ORDER — CEFAZOLIN SODIUM-DEXTROSE 2-4 GM/100ML-% IV SOLN
2.0000 g | INTRAVENOUS | Status: AC
  Administered 2020-11-15: 2 g via INTRAVENOUS

## 2020-11-15 MED ORDER — EPHEDRINE SULFATE 50 MG/ML IJ SOLN
INTRAMUSCULAR | Status: DC | PRN
  Administered 2020-11-15: 15 mg via INTRAVENOUS
  Administered 2020-11-15: 5 mg via INTRAVENOUS

## 2020-11-15 SURGICAL SUPPLY — 56 items
ADH SKN CLS APL DERMABOND .7 (GAUZE/BANDAGES/DRESSINGS) ×2
BAG DECANTER FOR FLEXI CONT (MISCELLANEOUS) ×2 IMPLANT
BINDER BREAST LRG (GAUZE/BANDAGES/DRESSINGS) IMPLANT
BINDER BREAST XLRG (GAUZE/BANDAGES/DRESSINGS) ×1 IMPLANT
BLADE SURG 10 STRL SS (BLADE) ×3 IMPLANT
BLADE SURG 15 STRL LF DISP TIS (BLADE) ×1 IMPLANT
BLADE SURG 15 STRL SS (BLADE) ×2
CANISTER SUCT 1200ML W/VALVE (MISCELLANEOUS) ×2 IMPLANT
COVER BACK TABLE 60X90IN (DRAPES) ×2 IMPLANT
COVER MAYO STAND STRL (DRAPES) ×2 IMPLANT
DERMABOND ADVANCED (GAUZE/BANDAGES/DRESSINGS) ×2
DERMABOND ADVANCED .7 DNX12 (GAUZE/BANDAGES/DRESSINGS) IMPLANT
DRAPE LAPAROSCOPIC ABDOMINAL (DRAPES) ×2 IMPLANT
DRSG OPSITE POSTOP 4X6 (GAUZE/BANDAGES/DRESSINGS) ×2 IMPLANT
DRSG PAD ABDOMINAL 8X10 ST (GAUZE/BANDAGES/DRESSINGS) ×4 IMPLANT
ELECT BLADE 4.0 EZ CLEAN MEGAD (MISCELLANEOUS) ×2
ELECT REM PT RETURN 9FT ADLT (ELECTROSURGICAL) ×2
ELECTRODE BLDE 4.0 EZ CLN MEGD (MISCELLANEOUS) ×1 IMPLANT
ELECTRODE REM PT RTRN 9FT ADLT (ELECTROSURGICAL) ×1 IMPLANT
GLOVE SURG ENC MOIS LTX SZ6.5 (GLOVE) ×4 IMPLANT
GLOVE SURG ENC MOIS LTX SZ7.5 (GLOVE) ×4 IMPLANT
GLOVE SURG POLYISO LF SZ6.5 (GLOVE) ×2 IMPLANT
GLOVE SURG UNDER POLY LF SZ6.5 (GLOVE) ×1 IMPLANT
GLOVE SURG UNDER POLY LF SZ7.5 (GLOVE) ×2 IMPLANT
GOWN STRL REUS W/ TWL LRG LVL3 (GOWN DISPOSABLE) ×2 IMPLANT
GOWN STRL REUS W/ TWL XL LVL3 (GOWN DISPOSABLE) IMPLANT
GOWN STRL REUS W/TWL LRG LVL3 (GOWN DISPOSABLE) ×2
GOWN STRL REUS W/TWL XL LVL3 (GOWN DISPOSABLE) ×6
IMPL BREAST SALINE HP 250CC (Breast) IMPLANT
IMPLANT BREAST SALINE HP 250CC (Breast) ×4 IMPLANT
IV NS 1000ML (IV SOLUTION) ×2
IV NS 1000ML BAXH (IV SOLUTION) IMPLANT
KIT FILL SYSTEM UNIVERSAL (SET/KITS/TRAYS/PACK) ×1 IMPLANT
NDL HYPO 25X1 1.5 SAFETY (NEEDLE) ×1 IMPLANT
NDL SAFETY ECLIPSE 18X1.5 (NEEDLE) ×1 IMPLANT
NEEDLE HYPO 18GX1.5 SHARP (NEEDLE) ×2
NEEDLE HYPO 25X1 1.5 SAFETY (NEEDLE) ×2 IMPLANT
NS IRRIG 1000ML POUR BTL (IV SOLUTION) ×2 IMPLANT
PACK BASIN DAY SURGERY FS (CUSTOM PROCEDURE TRAY) ×2 IMPLANT
PENCIL SMOKE EVACUATOR (MISCELLANEOUS) ×2 IMPLANT
SLEEVE SCD COMPRESS KNEE MED (STOCKING) ×2 IMPLANT
SPONGE T-LAP 18X18 ~~LOC~~+RFID (SPONGE) ×5 IMPLANT
STRIP SUTURE WOUND CLOSURE 1/2 (MISCELLANEOUS) ×3 IMPLANT
SUT MNCRL AB 4-0 PS2 18 (SUTURE) ×5 IMPLANT
SUT MON AB 3-0 SH 27 (SUTURE) ×12
SUT MON AB 3-0 SH27 (SUTURE) IMPLANT
SUT MON AB 5-0 PS2 18 (SUTURE) ×1 IMPLANT
SUT PDS AB 2-0 CT2 27 (SUTURE) ×10 IMPLANT
SYR BULB IRRIG 60ML STRL (SYRINGE) ×2 IMPLANT
SYR CONTROL 10ML LL (SYRINGE) ×2 IMPLANT
TOWEL GREEN STERILE FF (TOWEL DISPOSABLE) ×4 IMPLANT
TRAY DSU PREP LF (CUSTOM PROCEDURE TRAY) ×2 IMPLANT
TUBE CONNECTING 20X1/4 (TUBING) ×2 IMPLANT
TUBING SET GRADUATE ASPIR 12FT (MISCELLANEOUS) ×1 IMPLANT
UNDERPAD 30X36 HEAVY ABSORB (UNDERPADS AND DIAPERS) ×4 IMPLANT
YANKAUER SUCT BULB TIP NO VENT (SUCTIONS) ×2 IMPLANT

## 2020-11-15 NOTE — Anesthesia Postprocedure Evaluation (Signed)
Anesthesia Post Note  Patient: Alicja Everitt  Procedure(s) Performed: BREAST IMPLANT EXCHANGE (Bilateral: Breast) MASTOPEXY (Bilateral: Breast) LIPOSUCTION (Bilateral: Breast)     Patient location during evaluation: PACU Anesthesia Type: General Level of consciousness: awake and alert Pain management: pain level controlled Vital Signs Assessment: post-procedure vital signs reviewed and stable Respiratory status: spontaneous breathing, nonlabored ventilation and respiratory function stable Cardiovascular status: blood pressure returned to baseline and stable Postop Assessment: no apparent nausea or vomiting Anesthetic complications: no   No notable events documented.  Last Vitals:  Vitals:   11/15/20 1100 11/15/20 1115  BP: 138/78 131/79  Pulse: 86 92  Resp: (!) 28 14  Temp:    SpO2: 98% 94%    Last Pain:  Vitals:   11/15/20 1100  TempSrc:   PainSc: [place]

## 2020-11-15 NOTE — OR Nursing (Signed)
Existing breast implants removed bilaterally.

## 2020-11-15 NOTE — Discharge Instructions (Addendum)
INSTRUCTIONS FOR AFTER SURGERY   You will likely have some questions about what to expect following your operation.  The following information will help you and your family understand what to expect when you are discharged from the hospital.  Following these guidelines will help ensure a smooth recovery and reduce risks of complications.  Postoperative instructions include information on: diet, wound care, medications and physical activity.  AFTER SURGERY Expect to go home after the procedure.  In some cases, you may need to spend one night in the hospital for observation.  DIET This surgery does not require a specific diet.  However, I have to mention that the healthier you eat the better your body can start healing. It is important to increasing your protein intake.  This means limiting the foods with added sugar.  Focus on fruits and vegetables and some meat. It is very important to drink water after your surgery.  If your urine is bright yellow, then it is concentrated, and you need to drink more water.  As a general rule after surgery, you should have 8 ounces of water every hour while awake.  If you find you are persistently nauseated or unable to take in liquids let us know.  NO TOBACCO USE or EXPOSURE.  This will slow your healing process and increase the risk of a wound.  WOUND CARE If you don't have a drain: You can shower the day after surgery.  Use fragrance free soap.  Dial, Dove, Ivory and Cetaphil are usually mild on the skin. If you have steri-strips / tape directly attached to your skin leave them in place. It is OK to get these wet.  No baths, pools or hot tubs for two weeks. We close your incision to leave the smallest and best-looking scar. No ointment or creams on your incisions until given the go ahead.  Especially not Neosporin (Too many skin reactions with this one).  A few weeks after surgery you can use Mederma and start massaging the scar. We ask you to wear your binder or  sports bra for the first 6 weeks around the clock, including while sleeping. This provides added comfort and helps reduce the fluid accumulation at the surgery site.  ACTIVITY No heavy lifting until cleared by the doctor.  It is OK to walk and climb stairs. In fact, moving your legs is very important to decrease your risk of a blood clot.  It will also help keep you from getting deconditioned.  Every 1 to 2 hours get up and walk for 5 minutes. This will help with a quicker recovery back to normal.  Let pain be your guide so you don't do too much.  NO, you cannot do the spring cleaning and don't plan on taking care of anyone else.  This is your time for TLC.   WORK Everyone returns to work at different times. As a rough guide, most people take at least 1 - 2 weeks off prior to returning to work. If you need documentation for your job, bring the forms to your postoperative follow up visit.  DRIVING Arrange for someone to bring you home from the hospital.  You may be able to drive a few days after surgery but not while taking any narcotics or valium.  BOWEL MOVEMENTS Constipation can occur after anesthesia and while taking pain medication.  It is important to stay ahead for your comfort.  We recommend taking Milk of Magnesia (2 tablespoons; twice a day) while taking the   pain pills.  SEROMA This is fluid your body tried to put in the surgical site.  This is normal but if it creates excessive pain and swelling let us know.  It usually decreases in a few weeks.  MEDICATIONS and PAIN CONTROL At your preoperative visit for you history and physical you were given the following medications: An antibiotic: Start this medication when you get home and take according to the instructions on the bottle. Zofran 4 mg:  This is to treat nausea and vomiting.  You can take this every 6 hours as needed and only if needed. Norco (hydrocodone/acetaminophen) 5/325 mg:  This is only to be used after you have taken the  motrin or the tylenol. Every 8 hours as needed. Over the counter Medication to take: Ibuprofen (Motrin) 600 mg:  Take this every 6 hours.  If you have additional pain then take 500 mg of the tylenol.  Only take the Norco after you have tried these two. Miralax or stool softener of choice: Take this according to the bottle if you take the [place] Call your surgeon's office if any of the following occur:  Fever 101 degrees F or greater  Excessive bleeding or fluid from the incision site.  Pain that increases over time without aid from the medications  Redness, warmth, or pus draining from incision sites  Persistent nausea or inability to take in liquids  Severe misshapen area that underwent the operation.

## 2020-11-15 NOTE — Anesthesia Procedure Notes (Signed)

## 2020-11-15 NOTE — Interval H&P Note (Signed)
History and Physical Interval Note:  11/15/2020 7:25 AM  Alicia Hahn  has presented today for surgery, with the diagnosis of History of breast cancer, Acquired absence of both breasts,  Malignant neoplasm of upper-outer quadrant of right breast in female.  The various methods of treatment have been discussed with the patient and family. After consideration of risks, benefits and other options for treatment, the patient has consented to  Procedure(s): BREAST IMPLANT EXCHANGE (Bilateral) MASTOPEXY (Bilateral) as a surgical intervention.  The patient's history has been reviewed, patient examined, no change in status, stable for surgery.  I have reviewed the patient's chart and labs.  Questions were answered to the patient's satisfaction.     Alicia Hahn

## 2020-11-15 NOTE — Op Note (Signed)
Op report Bilateral Exchange   DATE OF OPERATION: 11/15/2020  LOCATION: [facility]  SURGICAL DIVISION: Plastic Surgery  PREOPERATIVE DIAGNOSIS:  History of breast cancer.  2.   Acquired absence of right breast.  3.   Breast asymmetry  POSTOPERATIVE DIAGNOSIS:  1.  History of breast cancer.  2.  Acquired absence of right breast.  3.  Breast asymmetry  PROCEDURE:  1. Bilateral exchange of implants.  2. Bilateral capsulorraphy for implant respositioning. 3. Left breast mastopexy for symmetry  SURGEON: Lyndee Leo Sanger Exie Chrismer, DO  ASSISTANT: Krista Blue, PA and Lennice Sites, MD  ANESTHESIA:  General.   COMPLICATIONS: None.   IMPLANTS: Left - [place] Smooth Round High Profile Saline 250 - 300 cc.  Ref #[id number].  250 cc placed Serial Number [id number] Right - [place] Smooth Round High Profile Saline 250 - 300 cc. Ref #[id number].  250 cc Serial Number [id number]  INDICATIONS FOR PROCEDURE:  The patient, Alicia Hahn, is a 60 y.o. female born on 1960/05/23, is here for treatment after a right partial mastectomy.  She had a saline implant placed and it has deflated and she has breast asymmetry.  She now presents for exchange of her implants.  She requires capsulotomies to better position the implants. MRN: [id number]  CONSENT:  Informed consent was obtained directly from the patient. Risks, benefits and alternatives were fully discussed. Specific risks including but not limited to bleeding, infection, hematoma, seroma, scarring, pain, implant infection, implant extrusion, capsular contracture, asymmetry, wound healing problems, and need for further surgery were all discussed. The patient did have an ample opportunity to have her questions answered to her satisfaction.   DESCRIPTION OF PROCEDURE:  The patient was taken to the operating room. SCDs were placed and IV antibiotics were given. The patient's chest was prepped and draped in a sterile fashion. A  time out was performed and the implants to be used were identified.    Right breast: Local with epinephrine was used to infiltrate at the incision site. This was the radiated breast.  An inframammary incision was made 3 cm in length with a #15 blade.  The bovie was used to dissect to the capsule.  The saline implant was a 450 cc implant and was deflated to 300 cc. The implant was removed.  The capsule was wide and extended to the mid axillary line.  The capsule was excised in an ellipse laterally.  It was then repositioned more medial by 1.5 cm using 3-0 PDS.  This provided better positioning for the implant.  The pocket was irrigated with antibiotic solution.  Hemostasis was ensured with electrocautery.  New gloves were placed. The 250-300 cc implant was selected and filled to 250 cc.  The implant was soaked in antibiotic solution and then placed in the pocket and oriented appropriately. The capsule was closed with a 3-0 PDS suture. The remaining skin was closed with 3-0 Monocryl deep dermal and 4-0 Monocryl subcuticular stitches.   Left breast: Local with epinephrine was used to infiltrate at the incision site.  A lateral limb incision was made 3 cm in length with a #15 blade.  The bovie was used to dissect to the capsule.  The saline implant was a 450 cc implant. The implant was removed.  The capsule was wide and extended to the mid axillary line.  The capsule was excised in an ellipse laterally.  It was then repositioned more medial by 1.5 cm using 3-0 PDS.  This provided better positioning for  the implant.  The pocket was irrigated with antibiotic solution.  Hemostasis was ensured with electrocautery.  New gloves were placed. The 250-300 cc implant was selected and filled to 300 cc.  This was too much and was deflated to 250 cc.  The implant was soaked in antibiotic solution and then placed in the pocket and oriented appropriately. The capsule was closed with a 3-0 PDS suture. There was some excess fat at  the vertical limb.  This was excised for better shape.  The liposuction was then used for the inframammary fold laterally for better contour.  The NAC was raised 2 cm for better symmetry.  The mastopexy was done with de-epithelialization of the skin at the vertical limb.  The edges were then re-approximated with the 3-0 PDS and Monocryl.  The left side was ~ 1 cm longer at the inframammary fold to NAC.  A wedge was then done to excise the the 1 cm inferior portion of the vertical limb at the inframammary fold.  This was worked in at the fold. The remaining skin was closed with the 4-0 Monocryl subcuticular stitches.  Dermabond was applied to the incision site. A breast binder and ABDs were placed.  The patient was allowed to wake from anesthesia and taken to the recovery room in satisfactory condition.   The advanced practice practitioner (APP) assisted throughout the case.  The APP was essential in retraction and counter traction when needed to make the case progress smoothly.  This retraction and assistance made it possible to see the tissue plans for the procedure.  The assistance was needed for blood control, tissue re-approximation and assisted with closure of the incision site.

## 2020-11-15 NOTE — Anesthesia Preprocedure Evaluation (Signed)
Anesthesia Evaluation  Patient identified by MRN, date of birth, ID band Patient awake    Reviewed: Allergy & Precautions, NPO status , Patient's Chart, lab work & pertinent test results  Airway Mallampati: II  TM Distance: >3 FB Neck ROM: Full    Dental no notable dental hx.    Pulmonary former smoker,    Pulmonary exam normal breath sounds clear to auscultation       Cardiovascular negative cardio ROS Normal cardiovascular exam Rhythm:Regular Rate:Normal     Neuro/Psych PSYCHIATRIC DISORDERS Anxiety  Neuromuscular disease    GI/Hepatic Neg liver ROS, PUD, GERD  ,  Endo/Other  negative endocrine ROS  Renal/GU Renal disease     Musculoskeletal  (+) Arthritis , Osteoarthritis,    Abdominal   Peds  Hematology   Anesthesia Other Findings   Reproductive/Obstetrics                             Anesthesia Physical  Anesthesia Plan  ASA: II  Anesthesia Plan: General   Post-op Pain Management:    Induction: Intravenous  PONV Risk Score and Plan: 3 and Ondansetron, Dexamethasone, Midazolam and Treatment may vary due to age or medical condition  Airway Management Planned: Oral ETT  Additional Equipment:   Intra-op Plan:   Post-operative Plan: Extubation in OR  Informed Consent: I have reviewed the patients History and Physical, chart, labs and discussed the procedure including the risks, benefits and alternatives for the proposed anesthesia with the patient or authorized representative who has indicated his/her understanding and acceptance.     Dental advisory given  Plan Discussed with: CRNA, Anesthesiologist and Surgeon  Anesthesia Plan Comments:         Anesthesia Quick Evaluation

## 2020-11-15 NOTE — Transfer of Care (Signed)
Immediate Anesthesia Transfer of Care Note  Patient: Alicia Hahn  Procedure(s) Performed: BREAST IMPLANT EXCHANGE (Bilateral: Breast) MASTOPEXY (Bilateral: Breast) LIPOSUCTION (Bilateral: Breast)  Patient Location: PACU  Anesthesia Type:General  Level of Consciousness: awake, alert , oriented, drowsy and patient cooperative  Airway & Oxygen Therapy: Patient Spontanous Breathing and Patient connected to face mask oxygen  Post-op Assessment: Report given to RN and Post -op Vital signs reviewed and stable  Post vital signs: Reviewed and stable  Last Vitals:  Vitals Value Taken Time  BP 134/72 11/15/20 1023  Temp    Pulse 91 11/15/20 1024  Resp 6 11/15/20 1024  SpO2 94 % 11/15/20 1024  Vitals shown include unvalidated device data.  Last Pain:  Vitals:   11/15/20 0637  TempSrc: Oral  PainSc: 0-No pain      Patients Stated Pain Goal: 3 (23/95/32 0233)  Complications: No notable events documented.

## 2020-11-16 LAB — SURGICAL PATHOLOGY

## 2020-11-19 ENCOUNTER — Encounter (HOSPITAL_BASED_OUTPATIENT_CLINIC_OR_DEPARTMENT_OTHER): Payer: Self-pay | Admitting: Plastic Surgery

## 2020-11-21 ENCOUNTER — Encounter (HOSPITAL_BASED_OUTPATIENT_CLINIC_OR_DEPARTMENT_OTHER): Payer: Self-pay | Admitting: Plastic Surgery

## 2020-11-23 ENCOUNTER — Other Ambulatory Visit: Payer: Self-pay

## 2020-11-23 ENCOUNTER — Ambulatory Visit (INDEPENDENT_AMBULATORY_CARE_PROVIDER_SITE_OTHER): Payer: 59 | Admitting: Plastic Surgery

## 2020-11-23 ENCOUNTER — Encounter: Payer: Self-pay | Admitting: Plastic Surgery

## 2020-11-23 DIAGNOSIS — C50411 Malignant neoplasm of upper-outer quadrant of right female breast: Secondary | ICD-10-CM

## 2020-11-23 DIAGNOSIS — Z170 Estrogen receptor positive status [ER+]: Secondary | ICD-10-CM

## 2020-11-23 NOTE — Progress Notes (Signed)
The patient is a 60 year old female here for follow-up on her bilateral breast surgery.  She is doing really well and pleased with her results to this point.  She has quite a bit of swelling on that left breast as expected.  I think that that will definitely go down over the next several weeks.  She is aware of that.  She can go ahead into a sports bra and shower.  I would like to see her back in 2 weeks.  We will get her pictures at that time.

## 2020-11-25 NOTE — Progress Notes (Signed)
[place]   [phone] [phone]   Clinic Follow up Note   Patient Care Team: Alicia Reid, PA-C as PCP - General Bensimhon, Shaune Pascal, MD as PCP - Advanced Heart Failure (Cardiology) Mcarthur Rossetti, MD as Consulting Physician (Orthopedic Surgery) Molli Posey, MD as Consulting Physician (Obstetrics and Gynecology) Erroll Luna, MD as Consulting Physician (General Surgery) Truitt Merle, MD as Consulting Physician (Hematology) Kyung Rudd, MD as Consulting Physician (Radiation Oncology) Mauro Kaufmann, RN as Oncology Nurse Navigator Rockwell Germany, RN as Oncology Nurse Navigator Alla Feeling, NP as Nurse Practitioner (Nurse Practitioner) 11/26/2020  CHIEF COMPLAINT: Follow up R breast cancer   SUMMARY OF ONCOLOGIC HISTORY: Oncology History Overview Note  Cancer Staging Malignant neoplasm of upper-outer quadrant of right breast in female, estrogen receptor positive ([place]) Staging form: Breast, AJCC 8th Edition - Clinical stage from 12/29/2018: Stage IA (cT1b, cN0, cM0, G2, ER+, PR+, HER2+) - Unsigned - Pathologic stage from 01/27/2019: Stage IA (pT1c, pN0, cM0, G3, ER+, PR+, HER2+) - Signed by Truitt Merle, MD on 02/06/2019 Cancer Staging No matching staging information was found for the patient.    Malignant neoplasm of upper-outer quadrant of right breast in female, estrogen receptor positive ([place])  12/22/2018 Mammogram    Diagnostic Mammogram 12/22/18  IMPRESSION: 1. 9 mm mass, with associated calcifications and architectural distortion, in the 11 o'clock position of the right breast, highly suspicious for breast carcinoma. 2. Questionable mass noted in the inferior aspect of the right breast remains equivocal on diagnostic imaging may reflect focal fibroglandular tissue. There is no sonographic correlate.   12/23/2018 Initial Biopsy   Diagnosis 12/23/18 Breast, right, needle core biopsy, 11 o'clock position - INVASIVE  DUCTAL CARCINOMA, SEE COMMENT. - DUCTAL CARCINOMA IN SITU WITH NECROSIS.   12/28/2018 Initial Diagnosis   Malignant neoplasm of upper-outer quadrant of right breast in female, estrogen receptor positive ([place])   01/13/2019 Imaging   MRI Breast 01/13/19  IMPRESSION: 1. Biopsy proven malignancy in the upper-outer right breast. No additional suspicious findings on the right. 2. No MRI evidence of malignancy on the left. 3. No suspicious lymphadenopathy.   01/27/2019 Surgery   RIGHT BREAST LUMPECTOMY WITH RADIOACTIVE SEED AND SENTINEL LYMPH NODE MAPPING and PAC placement by Dr Brantley Stage  01/27/19   01/27/2019 Pathology Results   FINAL MICROSCOPIC DIAGNOSIS: 01/27/19  A. BREAST, RIGHT, LUMPECTOMY:  - Invasive ductal carcinoma, grade 3, spanning 1.3 cm.  - High grade ductal carcinoma in situ with necrosis.  - Biopsy site.  - Final resection margins are negative for carcinoma.  - See oncology table.   B. BREAST, RIGHT ADDITIONAL SUPERIOR MARGIN, EXCISION:  - Fibrocystic change and adenosis.  - No malignancy identified.   C. LYMPH NODE, RIGHT #1, SENTINEL, BIOPSY:  - One of one lymph nodes negative for carcinoma (0/1).   D. LYMPH NODE, RIGHT, SENTINEL, BIOPSY:  - One of one lymph nodes negative for carcinoma (0/1).   E. LYMPH NODE, RIGHT, SENTINEL, BIOPSY:  - One of one lymph nodes negative for carcinoma (0/1).   F. LYMPH NODE, RIGHT, SENTINEL, BIOPSY:  - One of one lymph nodes negative for carcinoma (0/1).       01/27/2019 Cancer Staging   Staging form: Breast, AJCC 8th Edition - Pathologic stage from 01/27/2019: Stage IA (pT1c, pN0, cM0, G3, ER+, PR+, HER2+) - Signed by Truitt Merle, MD on 02/06/2019    02/08/2019 Imaging   CT AP W Contrast 02/08/19  IMPRESSION: 1. Wall thickening of  the gastric antrum may represent gastritis.   Aortic Atherosclerosis (ICD10-I70.0).   02/23/2019 - 01/25/2020 Chemotherapy   Adjuvant weekly Abraxane with trastuzumab-anns (Kanjinti) q3weeks starting 02/23/19.  Completed 12 week of Abraxane on 05/11/19. Maintenance trastuzumab-anns (Kanjinti) q3weeks start 05/18/19. Plan to complete 01/25/20.   06/13/2019 - 08/01/2019 Radiation Therapy   Radiation with Dr Lisbeth Renshaw starting 06/13/19-08/01/19   08/2019 -  Anti-estrogen oral therapy   Letrozole, beginning 08/25/2019    09/28/2019 Survivorship   SCP delivered by Cira Rue, NP      CURRENT THERAPY: Letrozole po once daily, starting 08/25/19  INTERVAL HISTORY: Alicia Hahn returns for follow up as scheduled. Last seen by Dr. Burr Medico 05/25/20. She underwent bilateral implant exchange and left breast lift 11/15/20 by Dr. Audelia Hives.  She subsequently fell while getting out of a car and tore a ligament in her left hand and underwent surgery.  She is doing well from both.  She has some nerve pain in the left outer breast.  She continues letrozole, tolerating well.  She has mild hot flashes at night which are tolerable.  Some achiness in her joints but not limiting activity and is unchanged.  Denies new bone or joint pain.  Mood is stable.  Denies fever, chills, cough, chest pain, dyspnea, abdominal pain or bloating, abnormal bleeding, change in bowel habits, or any other new complaints.  She is up-to-date on age-appropriate vaccines and cancer screenings.   MEDICAL HISTORY:  Past Medical History:  Diagnosis Date   Acid reflux    occasionally, and takes pepcid OTC   Anxiety    Breast cancer ([place])    Cancer ([place])    breast cancer   Gastric ulcer    GERD (gastroesophageal reflux disease)    Hyperlipidemia    Joint pain    Kidney stones    Lower extremity edema    Personal history of chemotherapy    Personal history of radiation therapy    Stomach ulcer    Vitamin D deficiency     SURGICAL HISTORY: Past Surgical History:  Procedure Laterality Date   AUGMENTATION MAMMAPLASTY     BREAST IMPLANT EXCHANGE Bilateral 11/15/2020   Procedure: BREAST IMPLANT EXCHANGE;  Surgeon: Wallace Going, DO;  Location:  [facility];  Service: Plastics;  Laterality: Bilateral;   BREAST LUMPECTOMY     BREAST LUMPECTOMY WITH RADIOACTIVE SEED AND SENTINEL LYMPH NODE BIOPSY Right 01/27/2019   Procedure: RIGHT BREAST LUMPECTOMY WITH RADIOACTIVE SEED AND SENTINEL LYMPH NODE MAPPING;  Surgeon: Erroll Luna, MD;  Location: [facility];  Service: General;  Laterality: Right;   COSMETIC SURGERY Bilateral 2000   breast aug/ lipo   LIPOSUCTION Bilateral 11/15/2020   Procedure: LIPOSUCTION;  Surgeon: Wallace Going, DO;  Location: [facility];  Service: Plastics;  Laterality: Bilateral;   MASTOPEXY Bilateral 11/15/2020   Procedure: MASTOPEXY;  Surgeon: Wallace Going, DO;  Location: [facility];  Service: Plastics;  Laterality: Bilateral;   PORT-A-CATH REMOVAL Right 04/05/2020   Procedure: REMOVAL PORT-A-CATH;  Surgeon: Erroll Luna, MD;  Location: [facility];  Service: General;  Laterality: Right;   PORTACATH PLACEMENT Right 01/27/2019   Procedure: INSERTION PORT-A-CATH WITH ULTRASOUND;  Surgeon: Erroll Luna, MD;  Location: [facility];  Service: General;  Laterality: Right;    I have reviewed the social history and family history with the patient and they are unchanged from previous note.  ALLERGIES:  has No Known Allergies.  MEDICATIONS:  Current Outpatient Medications  Medication Sig Dispense Refill   CALCIUM PO Take 1,000 mg by mouth daily.     letrozole (FEMARA) 2.5 MG tablet TAKE 1 TABLET BY MOUTH  DAILY 120 tablet 2   Magnesium 125 MG CAPS Take 1 capsule by mouth daily.      rosuvastatin (CRESTOR) 20 MG tablet Take 1 tablet (20 mg total) by mouth daily. **PLEASE CONTACT OUR OFFICE TO SCHEDULE A FOLLOW UP FOR FUTURE MED REFILLS** 90 tablet 0   venlafaxine XR (EFFEXOR-XR) 37.5 MG 24 hr capsule TAKE 2 CAPSULES BY MOUTH  DAILY WITH BREAKFAST 60 capsule 11   No current facility-administered medications for this  visit.    PHYSICAL EXAMINATION: ECOG PERFORMANCE STATUS: 1 - Symptomatic but completely ambulatory  Vitals:   11/26/20 1041  BP: 130/89  Pulse: 77  Resp: 17  Temp: 98.6 F (37 C)  SpO2: 99%   Filed Weights   11/26/20 1041  Weight: 151 lb 4 oz (68.6 kg)    GENERAL:alert, no distress and comfortable SKIN: No rash EYES: sclera clear NECK: Without mass LYMPH:  no palpable cervical or supraclavicular lymphadenopathy  LUNGS:  normal breathing effort HEART:  no lower extremity edema ABDOMEN:abdomen soft, non-tender and normal bowel sounds NEURO: alert & oriented x 3 with fluent speech, no focal motor/sensory deficits Breast exam: S/p bilateral breast reconstruction, no nipple discharge or inversion.  Right lumpectomy incisions completely healed with minimal scar tissue.  Small upper inner quadrant subQ nodule, likely a cyst. S/p left breast reconstruction, areolar and breast incisions healing well, steri strips intact. Mild bruising to the outer breast/chest wall. No palpable mass or nodularity in either breast or axilla that I could appreciate.   LABORATORY DATA:  I have reviewed the data as listed CBC Latest Ref Rng & Units 11/26/2020 05/25/2020 03/29/2020  WBC 4.0 - 10.5 K/uL 4.9 4.0 -  Hemoglobin 12.0 - 15.0 g/dL 14.3 14.7 -  Hematocrit 36.0 - 46.0 % 42.2 42.5 45.7  Platelets 150 - 400 K/uL 208 188 -     CMP Latest Ref Rng & Units 11/26/2020 05/25/2020 04/12/2020  Glucose 70 - 99 mg/dL 102(H) 100(H) -  BUN 8 - 23 mg/dL 12 17 -  Creatinine 0.44 - 1.00 mg/dL 0.68 0.91 -  Sodium 135 - 145 mmol/L 140 141 -  Potassium 3.5 - 5.1 mmol/L 4.1 4.2 -  Chloride 98 - 111 mmol/L 109 108 -  CO2 22 - 32 mmol/L 23 22 -  Calcium 8.9 - 10.3 mg/dL 8.9 9.4 -  Total Protein 6.5 - 8.1 g/dL 6.4(L) 6.9 6.6  Total Bilirubin 0.3 - 1.2 mg/dL 0.3 0.3 0.3  Alkaline Phos 38 - 126 U/L 81 97 93  AST 15 - 41 U/L _0 ALT 0 - 44 U/L 49(H) 25 29      RADIOGRAPHIC STUDIES: I have personally reviewed  the radiological images as listed and agreed with the findings in the report. No results found.   ASSESSMENT & PLAN: Alicia Hahn is a 60 y.o. female with      1. Malignant neoplasm of upper-outer quadrant of right breast, invasive ductal carcinoma, pT1cN0M0, stage IA, Triple Positive, Grade 3 -Diagnosed in 12/2018 s/p right lumpectomy and adjuvant radiation.  -She completed adjuvant chemotherapy with Weekly Abraxane and trastuzumab-anns (Kanjinti)  02/23/19-05/11/19 and maintenance Kanjinti (01/25/20). -She started antiestrogen therapy with Letrozole in 08/2019. She is tolerating well with manageable hot flashes so far.  -Mammogram 12/26/2019 and screening breast MRI 06/09/2020  were negative, continue close screening for high risk  -She recently underwent bilateral implant exchange and left breast augmentation by Dr. Audelia Hives 11/15/2020, recovering well -surgical PA recommended to wait 6 months before proceeding with mammogram. Will submit screening MRI for insurance approval in 12/2020 then resume mammograms in 6 months. I sent a message to her surgeon for their input on this timeline.  -Alicia Hahn is clinically doing well.  Tolerating letrozole.  Breast exam shows postsurgical changes, otherwise benign.  There is no clinical concern for recurrence.  -She is nearing 2 years from initial diagnosis, continue letrozole and surveillance. -Next surveillance follow-up in 6 months, or sooner if needed.   2. Hot Flashes  -Secondary to Letrozole  -On Effexor 9m, hot flashes are mild, mainly at night, and tolerable -Continue  3. Osteopenia  -Her 07/2019 DEXA shows osteopenia with lowest T-score -1.3 at right hip.  -She understands the risk AI can reduce BMD  -repeat DEXA in 07/2021.    4. Estrogen replacement and postmenopausal vaginal atrophy  -S/p MJosph Machoprocedure, she knows to avoid estrogen/hormonal based products   5. Genetics:  -myRisk genetic result 06/10/2017 showed no  pathogenic mutations, with a 30.5% remaining lifetime risk of breast cancer, this is high risk -Close screening annual mammogram and MRI staggered 6 months apart   PLAN: -12/21 mammo and 5/22 MRI reviewed, negative -Labs reviewed -No clinical concern for breast cancer recurrence -Continue surveillance and letrozole -Screening MRI in December, pending insurance approval and OK per plastics -Resume mammograms in 06/2021 -Surveillance visit with lab in 6 months, or sooner if needed  Orders Placed This Encounter  Procedures   [place]IL[place]AL [place]    Standing Status:   Future    Standing Expiration Date:   11/26/2021    Order Specific Question:   If indicated for the ordered procedure, I authorize the administration of contrast media per Radiology protocol    Answer:   Yes    Order Specific Question:   What is the patient's sedation requirement?    Answer:   No Sedation    Order Specific Question:   Does the patient have a pacemaker or implanted devices?    Answer:   No    Order Specific Question:   [facility]    [facility]    All questions were answered. The patient knows to call the clinic with any problems, questions or concerns. No barriers to learning were detected.  Total encounter time is 30 minutes.     LAlla Feeling NP 11/26/20

## 2020-11-26 ENCOUNTER — Other Ambulatory Visit: Payer: Self-pay

## 2020-11-26 ENCOUNTER — Encounter: Payer: Self-pay | Admitting: Nurse Practitioner

## 2020-11-26 ENCOUNTER — Inpatient Hospital Stay: Payer: 59

## 2020-11-26 ENCOUNTER — Inpatient Hospital Stay: Payer: 59 | Attending: Nurse Practitioner | Admitting: Nurse Practitioner

## 2020-11-26 ENCOUNTER — Telehealth: Payer: Self-pay | Admitting: Nurse Practitioner

## 2020-11-26 VITALS — BP 130/89 | HR 77 | Temp 98.6°F | Resp 17 | Wt 151.2 lb

## 2020-11-26 DIAGNOSIS — M85851 Other specified disorders of bone density and structure, right thigh: Secondary | ICD-10-CM | POA: Insufficient documentation

## 2020-11-26 DIAGNOSIS — Z170 Estrogen receptor positive status [ER+]: Secondary | ICD-10-CM | POA: Insufficient documentation

## 2020-11-26 DIAGNOSIS — C50411 Malignant neoplasm of upper-outer quadrant of right female breast: Secondary | ICD-10-CM

## 2020-11-26 DIAGNOSIS — Z923 Personal history of irradiation: Secondary | ICD-10-CM | POA: Insufficient documentation

## 2020-11-26 DIAGNOSIS — Z9221 Personal history of antineoplastic chemotherapy: Secondary | ICD-10-CM | POA: Insufficient documentation

## 2020-11-26 DIAGNOSIS — N951 Menopausal and female climacteric states: Secondary | ICD-10-CM | POA: Insufficient documentation

## 2020-11-26 DIAGNOSIS — Z79899 Other long term (current) drug therapy: Secondary | ICD-10-CM | POA: Diagnosis not present

## 2020-11-26 DIAGNOSIS — Z79811 Long term (current) use of aromatase inhibitors: Secondary | ICD-10-CM | POA: Diagnosis not present

## 2020-11-26 LAB — CBC WITH DIFFERENTIAL (CANCER CENTER ONLY)
Abs Immature Granulocytes: 0 10*3/uL (ref 0.00–0.07)
Basophils Absolute: 0 10*3/uL (ref 0.0–0.1)
Basophils Relative: 1 %
Eosinophils Absolute: 0.1 10*3/uL (ref 0.0–0.5)
Eosinophils Relative: 2 %
HCT: 42.2 % (ref 36.0–46.0)
Hemoglobin: 14.3 g/dL (ref 12.0–15.0)
Immature Granulocytes: 0 %
Lymphocytes Relative: 25 %
Lymphs Abs: 1.2 10*3/uL (ref 0.7–4.0)
MCH: 30.6 pg (ref 26.0–34.0)
MCHC: 33.9 g/dL (ref 30.0–36.0)
MCV: 90.4 fL (ref 80.0–100.0)
Monocytes Absolute: 0.5 10*3/uL (ref 0.1–1.0)
Monocytes Relative: 11 %
Neutro Abs: 3 10*3/uL (ref 1.7–7.7)
Neutrophils Relative %: 61 %
Platelet Count: 208 10*3/uL (ref 150–400)
RBC: 4.67 MIL/uL (ref 3.87–5.11)
RDW: 12.1 % (ref 11.5–15.5)
WBC Count: 4.9 10*3/uL (ref 4.0–10.5)
nRBC: 0 % (ref 0.0–0.2)

## 2020-11-26 LAB — CMP (CANCER CENTER ONLY)
ALT: 49 U/L — ABNORMAL HIGH (ref 0–44)
AST: 29 U/L (ref 15–41)
Albumin: 3.7 g/dL (ref 3.5–5.0)
Alkaline Phosphatase: 81 U/L (ref 38–126)
Anion gap: 8 (ref 5–15)
BUN: 12 mg/dL (ref 8–23)
CO2: 23 mmol/L (ref 22–32)
Calcium: 8.9 mg/dL (ref 8.9–10.3)
Chloride: 109 mmol/L (ref 98–111)
Creatinine: 0.68 mg/dL (ref 0.44–1.00)
GFR, Estimated: >60 mL/min (ref >60)
Glucose, Bld: 102 mg/dL — ABNORMAL HIGH (ref 70–99)
Potassium: 4.1 mmol/L (ref 3.5–5.1)
Sodium: 140 mmol/L (ref 135–145)
Total Bilirubin: 0.3 mg/dL (ref 0.3–1.2)
Total Protein: 6.4 g/dL — ABNORMAL LOW (ref 6.5–8.1)

## 2020-11-26 NOTE — Telephone Encounter (Signed)
Scheduled follow-up appointment per 11/7 los. Patient is aware.

## 2020-12-04 ENCOUNTER — Telehealth: Payer: Self-pay | Admitting: *Deleted

## 2020-12-04 NOTE — Telephone Encounter (Signed)
[facility] was called for pt Breast MRI. Order was placed for [facility] instead of [facility]. Order was changed to have Breast MRI done at [facility]. Secretary Janett Billow will call pt to schedule for December as requested.

## 2020-12-07 ENCOUNTER — Ambulatory Visit (INDEPENDENT_AMBULATORY_CARE_PROVIDER_SITE_OTHER): Payer: 59 | Admitting: Physician Assistant

## 2020-12-07 ENCOUNTER — Other Ambulatory Visit: Payer: Self-pay

## 2020-12-07 DIAGNOSIS — Z170 Estrogen receptor positive status [ER+]: Secondary | ICD-10-CM

## 2020-12-07 DIAGNOSIS — C50411 Malignant neoplasm of upper-outer quadrant of right female breast: Secondary | ICD-10-CM

## 2020-12-07 NOTE — Progress Notes (Signed)
Patient is a 60 year old female with PMH of right-sided breast cancer and reconstruction who is now s/p implant exchange with mastopexy performed 11/15/2020 with Dr. [name].  She was seen most recently here in clinic on 11/23/2020 for her initial postoperative follow-up.  She was doing quite well at that time and was pleased with her result.  Plan is for compression bra and slow increase in activity, as tolerated.  Plan for pictures at subsequent appointment.  Today, patient states that she is doing very well.  She did have a small step-off area over vertical limb left breast which she has placed a bandage over.  Denies any significant drainage from that area.  No other areas of dehiscence or wounds noted.  Denies any breast redness or swelling.  She states that her right breast is much more ptotic than her left, but understands that her left breast will continue to settle out with time.  She denies any fevers, chills, infection, or other symptoms.  Physical exam is entirely reassuring.  Right breast is more ptotic than the left, but they appear similar in size.  Left NAC is viable.  Right is chronically hypopigmented from previous lumpectomy.  Incisions all appear to be healing well aside from mild step-off over vertical limb, left breast.  No surrounding areas of erythema or induration.  No drainage.  Recommending Vaseline gauze over step-off area, left breast vertical limb.  Remainder of incisions appear to be healing quite well.  Recommending continued compression bra and activity modification.  Patient can follow-up for 6-week appointment if she would like.  However, if she feels as though she has no wounds, she can certainly cancel.  Discussed scar management creams, but would like for her incisions to continue healing first.  She understands that the asymmetry will continue to settle out over the course of the next 6 to 12 months.  Picture(s) obtained of the patient and placed in the chart were  with the patient's or guardian's permission.

## 2020-12-24 NOTE — Progress Notes (Signed)
Patient is a 60 year old female with PMH of right-sided breast cancer and reconstruction who is now s/p implant exchange with mastopexy performed 11/15/2020 with Dr. [name].  She was last seen here in the office on 12/07/2020.  At that time, exam was largely reassuring aside from mild step-off over vertical limb incision, left breast.  Remainder of incisions appear to be healing quite well.  Recommended Vaseline and gauze over area of step-off and continued compression bra.  Discussed scar management creams.  Right NAC is chronically hypopigmented.  Today, patient is doing well.  States that the step-off had resolved with application of Vaseline and gauze.  Patient also reports that her right breast ptosis relative to left breast had improved dramatically.  She denies any ongoing complaints and is overall quite pleased with her surgical outcome.  If anything, she feels as though she is still larger than she anticipated.  She understands that her breast size will fluctuate with any weight gain or weight loss moving forward.  Physical exam is entirely reassuring.  Breasts have healed nicely.  Great symmetry and shape.  Right nipple remains chronically hypopigmented ever since her lumpectomy.  Discussed nipple tattooing for symmetry and she expressed interest.  No residual step-off over left vertical limb incision.  From a postoperative standpoint, no specific follow-up needed.  She has healed quite nicely.  Recommending daily Skinuva or Mederma for scar mitigation.  Also encouraged her to continue avoiding any UV exposure or submersion of her breasts in water for an additional couple of months.  Otherwise, no restrictions.  Picture(s) obtained of the patient and placed in the chart were with the patient's or guardian's permission.

## 2020-12-28 ENCOUNTER — Other Ambulatory Visit: Payer: Self-pay

## 2020-12-28 ENCOUNTER — Ambulatory Visit (INDEPENDENT_AMBULATORY_CARE_PROVIDER_SITE_OTHER): Payer: 59 | Admitting: Physician Assistant

## 2020-12-28 DIAGNOSIS — Z9889 Other specified postprocedural states: Secondary | ICD-10-CM

## 2021-01-11 ENCOUNTER — Other Ambulatory Visit: Payer: Self-pay

## 2021-01-11 ENCOUNTER — Ambulatory Visit (HOSPITAL_COMMUNITY)
Admission: RE | Admit: 2021-01-11 | Discharge: 2021-01-11 | Disposition: A | Discharge status: Home / Self Care | Payer: 59 | Source: Ambulatory Visit | Attending: Nurse Practitioner | Admitting: Nurse Practitioner

## 2021-01-11 DIAGNOSIS — C50411 Malignant neoplasm of upper-outer quadrant of right female breast: Secondary | ICD-10-CM | POA: Diagnosis present

## 2021-01-11 DIAGNOSIS — Z170 Estrogen receptor positive status [ER+]: Secondary | ICD-10-CM | POA: Insufficient documentation

## 2021-01-11 MED ORDER — GADOBUTROL 1 MMOL/ML IV SOLN: 7.0000 mL | Freq: Once | INTRAVENOUS | Status: DC | PRN

## 2021-01-11 MED ORDER — GADOBUTROL 1 MMOL/ML IV SOLN
7.0000 mL | Freq: Once | INTRAVENOUS | Status: AC | PRN
  Administered 2021-01-11: 17:00:00 7 mL via INTRAVENOUS

## 2021-02-14 ENCOUNTER — Other Ambulatory Visit: Payer: Self-pay | Admitting: Hematology

## 2021-02-14 DIAGNOSIS — Z170 Estrogen receptor positive status [ER+]: Secondary | ICD-10-CM

## 2021-02-14 DIAGNOSIS — R232 Flushing: Secondary | ICD-10-CM

## 2021-04-01 ENCOUNTER — Other Ambulatory Visit: Payer: Self-pay | Admitting: Physician Assistant

## 2021-04-01 DIAGNOSIS — E785 Hyperlipidemia, unspecified: Secondary | ICD-10-CM

## 2021-05-08 ENCOUNTER — Other Ambulatory Visit: Payer: Self-pay | Admitting: Physician Assistant

## 2021-05-08 DIAGNOSIS — E785 Hyperlipidemia, unspecified: Secondary | ICD-10-CM

## 2021-05-27 ENCOUNTER — Other Ambulatory Visit: Payer: Self-pay

## 2021-05-27 ENCOUNTER — Inpatient Hospital Stay: Payer: 59

## 2021-05-27 ENCOUNTER — Inpatient Hospital Stay: Payer: 59 | Attending: Hematology | Admitting: Hematology

## 2021-05-27 VITALS — BP 122/69 | HR 74 | Temp 98.4°F | Resp 18 | Ht 63.0 in | Wt 170.6 lb

## 2021-05-27 DIAGNOSIS — Z923 Personal history of irradiation: Secondary | ICD-10-CM | POA: Insufficient documentation

## 2021-05-27 DIAGNOSIS — M85851 Other specified disorders of bone density and structure, right thigh: Secondary | ICD-10-CM | POA: Diagnosis not present

## 2021-05-27 DIAGNOSIS — C50411 Malignant neoplasm of upper-outer quadrant of right female breast: Secondary | ICD-10-CM

## 2021-05-27 DIAGNOSIS — N952 Postmenopausal atrophic vaginitis: Secondary | ICD-10-CM | POA: Diagnosis not present

## 2021-05-27 DIAGNOSIS — Z170 Estrogen receptor positive status [ER+]: Secondary | ICD-10-CM | POA: Diagnosis not present

## 2021-05-27 DIAGNOSIS — Z79899 Other long term (current) drug therapy: Secondary | ICD-10-CM | POA: Insufficient documentation

## 2021-05-27 DIAGNOSIS — N951 Menopausal and female climacteric states: Secondary | ICD-10-CM | POA: Insufficient documentation

## 2021-05-27 DIAGNOSIS — Z9221 Personal history of antineoplastic chemotherapy: Secondary | ICD-10-CM | POA: Diagnosis not present

## 2021-05-27 LAB — CMP (CANCER CENTER ONLY)
ALT: 38 U/L (ref 0–44)
AST: 31 U/L (ref 15–41)
Albumin: 4.2 g/dL (ref 3.5–5.0)
Alkaline Phosphatase: 76 U/L (ref 38–126)
Anion gap: 4 — ABNORMAL LOW (ref 5–15)
BUN: 18 mg/dL (ref 8–23)
CO2: 30 mmol/L (ref 22–32)
Calcium: 9.5 mg/dL (ref 8.9–10.3)
Chloride: 106 mmol/L (ref 98–111)
Creatinine: 0.81 mg/dL (ref 0.44–1.00)
GFR, Estimated: >60 mL/min (ref >60)
Glucose, Bld: 92 mg/dL (ref 70–99)
Potassium: 4.3 mmol/L (ref 3.5–5.1)
Sodium: 140 mmol/L (ref 135–145)
Total Bilirubin: 0.4 mg/dL (ref 0.3–1.2)
Total Protein: 7 g/dL (ref 6.5–8.1)

## 2021-05-27 LAB — CBC WITH DIFFERENTIAL (CANCER CENTER ONLY)
Abs Immature Granulocytes: 0.01 10*3/uL (ref 0.00–0.07)
Basophils Absolute: 0 10*3/uL (ref 0.0–0.1)
Basophils Relative: 1 %
Eosinophils Absolute: 0.1 10*3/uL (ref 0.0–0.5)
Eosinophils Relative: 2 %
HCT: 43 % (ref 36.0–46.0)
Hemoglobin: 14.8 g/dL (ref 12.0–15.0)
Immature Granulocytes: 0 %
Lymphocytes Relative: 26 %
Lymphs Abs: 1.2 10*3/uL (ref 0.7–4.0)
MCH: 31.2 pg (ref 26.0–34.0)
MCHC: 34.4 g/dL (ref 30.0–36.0)
MCV: 90.5 fL (ref 80.0–100.0)
Monocytes Absolute: 0.6 10*3/uL (ref 0.1–1.0)
Monocytes Relative: 12 %
Neutro Abs: 2.7 10*3/uL (ref 1.7–7.7)
Neutrophils Relative %: 59 %
Platelet Count: 187 10*3/uL (ref 150–400)
RBC: 4.75 MIL/uL (ref 3.87–5.11)
RDW: 12.6 % (ref 11.5–15.5)
WBC Count: 4.6 10*3/uL (ref 4.0–10.5)
nRBC: 0 % (ref 0.0–0.2)

## 2021-05-27 NOTE — Progress Notes (Signed)
?[facility]   ?[phone] [phone]   ?Clinic Follow up Note  ? ?Patient Care Team: ?Lorrene Reid, PA-C as PCP - General ?Bensimhon, Shaune Pascal, MD as PCP - Advanced Heart Failure (Cardiology) ?Mcarthur Rossetti, MD as Consulting Physician (Orthopedic Surgery) ?Molli Posey, MD as Consulting Physician (Obstetrics and Gynecology) ?Erroll Luna, MD as Consulting Physician (General Surgery) ?Truitt Merle, MD as Consulting Physician (Hematology) ?Kyung Rudd, MD as Consulting Physician (Radiation Oncology) ?Mauro Kaufmann, RN as Oncology Nurse Navigator ?Rockwell Germany, RN as Oncology Nurse Navigator ?Alla Feeling, NP as Nurse Practitioner (Nurse Practitioner) ? ?Date of Service:  05/27/2021 ? ?CHIEF COMPLAINT: f/u of right breast cancer ? ?CURRENT THERAPY:  ?Letrozole, starting 08/25/19 ? ?ASSESSMENT & PLAN:  ?Alicia Hahn is a 61 y.o. post-menopausal female with  ? ?1. Malignant neoplasm of upper-outer quadrant of right breast, invasive ductal carcinoma, pT1cN0M0, stage IA, Triple Positive, Grade 3 ?-Diagnosed in 12/2018 s/p right lumpectomy and adjuvant radiation.  ?-She completed adjuvant chemotherapy with weekly Abraxane and Kanjinti 02/23/19 - 05/11/19 and maintenance Kanjinti 01/25/20. ?-She started antiestrogen therapy with Letrozole in 08/2019. She is tolerating well with manageable hot flashes so far.  ?-Mammogram 12/26/19 and screening breast MRI 01/11/21 were benign, continue close screening for high risk. Mammogram held due to reconstruction in 10/2020, will plan to restart in June or July. ?-she is clinically doing well. Labs reviewed, overall WNL. Breast exam shows postsurgical changes, otherwise benign.  There is no clinical concern for recurrence.  ?-Next surveillance follow-up in 6 months, or sooner if needed. ?  ?2. Hot Flashes  ?-Secondary to Letrozole, mild and mainly at night ?-tolerable on Effexor $RemoveBe'75mg'LpxbLQuLE$ , will continue ?  ?3. Osteopenia  ?-Her  07/2019 DEXA shows osteopenia with lowest T-score -1.3 at right hip.  ?-She understands the risk AI can reduce BMD  ?-repeat DEXA in 07/2021.  ?  ?4. Estrogen replacement and postmenopausal vaginal atrophy  ?-S/p Josph Macho procedure, she knows to avoid estrogen/hormonal based products  ?  ?5. Genetics:  ?-myRisk genetic result 06/10/17 showed no pathogenic mutations, with a 30.5% remaining lifetime risk of breast cancer, this is high risk ?-Close screening annual mammogram and MRI staggered 6 months apart ?  ? ?PLAN: ?-Continue letrozole ?-mammogram at [facility] and DEXA at her GYN office in 07/2021 ?-lab and f/u in 6 months ?-Screening MRI in 12/2021 ? ? ?No problem-specific Assessment & Plan notes found for this encounter. ? ? ?SUMMARY OF ONCOLOGIC HISTORY: ?Oncology History Overview Note  ?Cancer Staging ?Malignant neoplasm of upper-outer quadrant of right breast in female, estrogen receptor positive ([place]) ?Staging form: Breast, AJCC 8th Edition ?- Clinical stage from 12/29/2018: Stage IA (cT1b, cN0, cM0, G2, ER+, PR+, HER2+) - Unsigned ?- Pathologic stage from 01/27/2019: Stage IA (pT1c, pN0, cM0, G3, ER+, PR+, HER2+) - Signed by Truitt Merle, MD on 02/06/2019 ?Cancer Staging ?No matching staging information was found for the patient. ? ?  ?Malignant neoplasm of upper-outer quadrant of right breast in female, estrogen receptor positive ([place])  ?12/22/2018 Mammogram  ?  ?Diagnostic Mammogram 12/22/18  ?IMPRESSION: ?1. 9 mm mass, with associated calcifications and architectural ?distortion, in the 11 o'clock position of the right breast, highly ?suspicious for breast carcinoma. ?2. Questionable mass noted in the inferior aspect of the right ?breast remains equivocal on diagnostic imaging may reflect focal ?fibroglandular tissue. There is no sonographic correlate. ?  ?12/23/2018 Initial Biopsy  ? Diagnosis 12/23/18 ?Breast, right, needle core biopsy, 11 o'clock position ?-  INVASIVE DUCTAL CARCINOMA, SEE COMMENT. ?- DUCTAL CARCINOMA IN  SITU WITH NECROSIS. ?  ?12/28/2018 Initial Diagnosis  ? Malignant neoplasm of upper-outer quadrant of right breast in female, estrogen receptor positive ([place]) ?  ?01/13/2019 Imaging  ? MRI Breast 01/13/19  ?IMPRESSION: ?1. Biopsy proven malignancy in the upper-outer right breast. No ?additional suspicious findings on the right. ?2. No MRI evidence of malignancy on the left. ?3. No suspicious lymphadenopathy. ?  ?01/27/2019 Surgery  ? RIGHT BREAST LUMPECTOMY WITH RADIOACTIVE SEED AND SENTINEL LYMPH NODE MAPPING and PAC placement by Dr Brantley Stage  ?01/27/19 ?  ?01/27/2019 Pathology Results  ? FINAL MICROSCOPIC DIAGNOSIS: 01/27/19 ? ?A. BREAST, RIGHT, LUMPECTOMY:  ?- Invasive ductal carcinoma, grade 3, spanning 1.3 cm.  ?- High grade ductal carcinoma in situ with necrosis.  ?- Biopsy site.  ?- Final resection margins are negative for carcinoma.  ?- See oncology table.  ? ?B. BREAST, RIGHT ADDITIONAL SUPERIOR MARGIN, EXCISION:  ?- Fibrocystic change and adenosis.  ?- No malignancy identified.  ? ?C. LYMPH NODE, RIGHT #1, SENTINEL, BIOPSY:  ?- One of one lymph nodes negative for carcinoma (0/1).  ? ?D. LYMPH NODE, RIGHT, SENTINEL, BIOPSY:  ?- One of one lymph nodes negative for carcinoma (0/1).  ? ?E. LYMPH NODE, RIGHT, SENTINEL, BIOPSY:  ?- One of one lymph nodes negative for carcinoma (0/1).  ? ?F. LYMPH NODE, RIGHT, SENTINEL, BIOPSY:  ?- One of one lymph nodes negative for carcinoma (0/1).  ? ? ? ?  ?01/27/2019 Cancer Staging  ? Staging form: Breast, AJCC 8th Edition ?- Pathologic stage from 01/27/2019: Stage IA (pT1c, pN0, cM0, G3, ER+, PR+, HER2+) - Signed by Truitt Merle, MD on 02/06/2019 ? ?  ?02/08/2019 Imaging  ? CT AP W Contrast 02/08/19  ?IMPRESSION: ?1. Wall thickening of the gastric antrum may represent gastritis. ?  ?Aortic Atherosclerosis (ICD10-I70.0). ?  ?02/23/2019 - 01/25/2020 Chemotherapy  ? Adjuvant weekly Abraxane with trastuzumab-anns (Kanjinti) q3weeks starting 02/23/19. Completed 12 week of Abraxane on 05/11/19. Maintenance  trastuzumab-anns (Kanjinti) q3weeks start 05/18/19. Plan to complete 01/25/20. ?  ?06/13/2019 - 08/01/2019 Radiation Therapy  ? Radiation with Dr Lisbeth Renshaw starting 06/13/19-08/01/19 ?  ?08/2019 -  Anti-estrogen oral therapy  ? Letrozole, beginning 08/25/2019  ?  ?09/28/2019 Survivorship  ? SCP delivered by Cira Rue, NP  ?  ? ? ? ?INTERVAL HISTORY:  ?Alicia Hahn is here for a follow up of breast cancer. She was last seen by NP Lacie on 11/26/20. She presents to the clinic alone. ?She reports she is doing well overall. She notes some mild joint pain in the mornings. ?  ?All other systems were reviewed with the patient and are negative. ? ?MEDICAL HISTORY:  ?Past Medical History:  ?Diagnosis Date  ? Acid reflux   ? occasionally, and takes pepcid OTC  ? Anxiety   ? Breast cancer ([place])   ? [facility])   ? breast cancer  ? Gastric ulcer   ? GERD (gastroesophageal reflux disease)   ? Hyperlipidemia   ? Joint pain   ? Kidney stones   ? Lower extremity edema   ? Personal history of chemotherapy   ? Personal history of radiation therapy   ? Stomach ulcer   ? Vitamin D deficiency   ? ? ?SURGICAL HISTORY: ?Past Surgical History:  ?Procedure Laterality Date  ? AUGMENTATION MAMMAPLASTY    ? BREAST IMPLANT EXCHANGE Bilateral 11/15/2020  ? Procedure: BREAST IMPLANT EXCHANGE;  Surgeon: Wallace Going, DO;  Location: [facility];  Service: Clinical cytogeneticist;  Laterality: Bilateral;  ? BREAST LUMPECTOMY    ? BREAST LUMPECTOMY WITH RADIOACTIVE SEED AND SENTINEL LYMPH NODE BIOPSY Right 01/27/2019  ? Procedure: RIGHT BREAST LUMPECTOMY WITH RADIOACTIVE SEED AND SENTINEL LYMPH NODE MAPPING;  Surgeon: Erroll Luna, MD;  Location: [place];  Service: General;  Laterality: Right;  ? COSMETIC SURGERY Bilateral 2000  ? breast aug/ lipo  ? LIPOSUCTION Bilateral 11/15/2020  ? Procedure: LIPOSUCTION;  Surgeon: Wallace Going, DO;  Location: [place];  Service: Plastics;  Laterality: Bilateral;  ?  MASTOPEXY Bilateral 11/15/2020  ? Procedure: MASTOPEXY;  Surgeon: Wallace Going, DO;  Location: [place];  Service: Plastics;  Laterality: Bilateral;  ? PORT-A-CATH REMOVAL Right 3/1

## 2021-05-28 ENCOUNTER — Encounter: Payer: Self-pay | Admitting: Hematology

## 2021-06-05 ENCOUNTER — Other Ambulatory Visit: Payer: Self-pay

## 2021-06-05 DIAGNOSIS — Z170 Estrogen receptor positive status [ER+]: Secondary | ICD-10-CM

## 2021-06-07 NOTE — Patient Instructions (Signed)

## 2021-06-12 ENCOUNTER — Encounter: Payer: Self-pay | Admitting: Physician Assistant

## 2021-06-12 ENCOUNTER — Ambulatory Visit (INDEPENDENT_AMBULATORY_CARE_PROVIDER_SITE_OTHER): Payer: 59 | Admitting: Physician Assistant

## 2021-06-12 VITALS — BP 107/74 | HR 96 | Temp 97.7°F | Ht 63.0 in | Wt 168.0 lb

## 2021-06-12 DIAGNOSIS — C50411 Malignant neoplasm of upper-outer quadrant of right female breast: Secondary | ICD-10-CM

## 2021-06-12 DIAGNOSIS — E785 Hyperlipidemia, unspecified: Secondary | ICD-10-CM

## 2021-06-12 DIAGNOSIS — Z170 Estrogen receptor positive status [ER+]: Secondary | ICD-10-CM

## 2021-06-12 DIAGNOSIS — R632 Polyphagia: Secondary | ICD-10-CM | POA: Diagnosis not present

## 2021-06-12 DIAGNOSIS — E7849 Other hyperlipidemia: Secondary | ICD-10-CM

## 2021-06-12 DIAGNOSIS — Z1329 Encounter for screening for other suspected endocrine disorder: Secondary | ICD-10-CM | POA: Diagnosis not present

## 2021-06-12 DIAGNOSIS — M8580 Other specified disorders of bone density and structure, unspecified site: Secondary | ICD-10-CM | POA: Insufficient documentation

## 2021-06-12 DIAGNOSIS — C50919 Malignant neoplasm of unspecified site of unspecified female breast: Secondary | ICD-10-CM | POA: Insufficient documentation

## 2021-06-12 DIAGNOSIS — E559 Vitamin D deficiency, unspecified: Secondary | ICD-10-CM

## 2021-06-12 MED ORDER — ROSUVASTATIN CALCIUM 20 MG PO TABS: 20.0000 mg | ORAL_TABLET | Freq: Every day | ORAL | 2 refills | Status: DC

## 2021-06-12 NOTE — Assessment & Plan Note (Signed)
-  Last lipid panel: LDL 82 -Will repeat lipid panel today. -Continue current medication regimen.  -Follow a heart healthy diet.

## 2021-06-12 NOTE — Assessment & Plan Note (Signed)
-  Followed by oncology. -On Letrozole 2.5 mg.

## 2021-06-12 NOTE — Progress Notes (Signed)
Established patient visit   Patient: Evanne Matsunaga   DOB: January 10, 1961   61 y.o. Female  MRN: [id number] Visit Date: 06/12/2021  Chief Complaint  Patient presents with   Hyperlipidemia        Subjective    HPI HPI     Hyperlipidemia    Additional comments:        Last edited by Mickel Crow, CMA on 06/12/2021  9:47 AM.      Patient presents for follow-up on hyperlipidemia. Patient has no acute concerns.   HLD: Pt taking medication as directed without issues. Denies side effects including myalgias or muscle weakness.   Vitamin D deficiency : Patient reports taking 2000 units of Vitamin d3 daily along with her calcium supplement.   Medications: Outpatient Medications Prior to Visit  Medication Sig   letrozole (FEMARA) 2.5 MG tablet TAKE 1 TABLET BY MOUTH  DAILY   venlafaxine XR (EFFEXOR-XR) 37.5 MG 24 hr capsule TAKE 2 CAPSULES BY MOUTH  DAILY WITH BREAKFAST   [DISCONTINUED] rosuvastatin (CRESTOR) 20 MG tablet Take 1 tablet (20 mg total) by mouth daily. **PLEASE CONTACT OUR OFFICE TO SCHEDULE A FOLLOW UP FOR FUTURE MED REFILLS**   CALCIUM PO Take 1,000 mg by mouth daily.   Magnesium 125 MG CAPS Take 1 capsule by mouth daily.    No facility-administered medications prior to visit.    Review of Systems Review of Systems:  A fourteen system review of systems was performed and found to be positive as per HPI.   Last CBC Lab Results  Component Value Date   WBC 4.6 05/27/2021   HGB 14.8 05/27/2021   HCT 43.0 05/27/2021   MCV 90.5 05/27/2021   MCH 31.2 05/27/2021   RDW 12.6 05/27/2021   PLT 187 60/10/9321   Last metabolic panel Lab Results  Component Value Date   GLUCOSE 92 05/27/2021   NA 140 05/27/2021   K 4.3 05/27/2021   CL 106 05/27/2021   CO2 30 05/27/2021   BUN 18 05/27/2021   CREATININE 0.81 05/27/2021   GFRNONAA >60 05/27/2021   CALCIUM 9.5 05/27/2021   PROT 7.0 05/27/2021   ALBUMIN 4.2 05/27/2021   LABGLOB 2.5 03/29/2020   AGRATIO  1.9 03/29/2020   BILITOT 0.4 05/27/2021   ALKPHOS 76 05/27/2021   AST 31 05/27/2021   ALT 38 05/27/2021   ANIONGAP 4 (L) 05/27/2021   Last lipids Lab Results  Component Value Date   CHOL 156 04/12/2020   HDL 53 04/12/2020   LDLCALC 82 04/12/2020   TRIG 119 04/12/2020   CHOLHDL 2.9 04/12/2020   Last hemoglobin A1c Lab Results  Component Value Date   HGBA1C 5.5 02/21/2020   Last thyroid functions Lab Results  Component Value Date   TSH 2.200 02/21/2020   T3TOTAL 88 02/02/2019   Last vitamin D Lab Results  Component Value Date   VD25OH 144.0 (H) 03/29/2020     Objective    BP 107/74   Pulse 96   Temp 97.7 F (36.5 C)   Ht _0  (1.6 m)   Wt 168 lb (76.2 kg)   SpO2 100%   BMI 29.76 kg/m  BP Readings from Last 3 Encounters:  06/12/21 107/74  05/27/21 122/69  11/26/20 130/89   Wt Readings from Last 3 Encounters:  06/12/21 168 lb (76.2 kg)  05/27/21 170 lb 9.6 oz (77.4 kg)  11/26/20 151 lb 4 oz (68.6 kg)    Physical Exam  General:  Well Developed, well nourished,  appropriate for stated age.  Neuro:  Alert and oriented,  extra-ocular muscles intact  HEENT:  Normocephalic, atraumatic, neck supple  Skin:  no gross rash, warm, pink. Cardiac:  RRR, S1 S2 Respiratory: CTA B/L  Vascular:  Ext warm, no cyanosis apprec.; cap RF less 2 sec. Psych:  No HI/SI, judgement and insight good, Euthymic mood. Full Affect.   No results found for any visits on 06/12/21.  Assessment & Plan      Problem List Items Addressed This Visit       Other   Malignant neoplasm of upper-outer quadrant of right breast in female, estrogen receptor positive ([place])    -Followed by oncology. -On Letrozole 2.5 mg.       Hyperlipidemia - Primary    -Last lipid panel: LDL 82 -Will repeat lipid panel today. -Continue current medication regimen.  -Follow a heart healthy diet.       Relevant Medications   rosuvastatin (CRESTOR) 20 MG tablet   Other Relevant Orders   Comp Met  (CMET)   Lipid Profile   Other Visit Diagnoses     Vitamin D deficiency       Relevant Orders   Vitamin D (25 hydroxy)   Polyphagia       Relevant Orders   HgB A1c   Screening for thyroid disorder       Relevant Orders   TSH       Vitamin D deficiency: -Will collect Vitamin D lab. Recommend to continue with Vitamin D3 supplement.   Patient is fasting and due for routine fasting labs. Recommend to schedule CPE as advised.  Return in about 6 months (around 12/13/2021) for CPE.        Lorrene Reid, PA-C  [facility] [phone] (phone) [phone] (fax)  [place]

## 2021-06-13 ENCOUNTER — Encounter: Payer: Self-pay | Admitting: Hematology

## 2021-06-13 LAB — COMPREHENSIVE METABOLIC PANEL
ALT: 27 IU/L (ref 0–32)
AST: 21 IU/L (ref 0–40)
Albumin/Globulin Ratio: 2 (ref 1.2–2.2)
Albumin: 4.4 g/dL (ref 3.8–4.8)
Alkaline Phosphatase: 98 IU/L (ref 44–121)
BUN/Creatinine Ratio: 17 (ref 12–28)
BUN: 13 mg/dL (ref 8–27)
Bilirubin Total: 0.4 mg/dL (ref 0.0–1.2)
CO2: 24 mmol/L (ref 20–29)
Calcium: 9.6 mg/dL (ref 8.7–10.3)
Chloride: 103 mmol/L (ref 96–106)
Creatinine, Ser: 0.78 mg/dL (ref 0.57–1.00)
Globulin, Total: 2.2 g/dL (ref 1.5–4.5)
Glucose: 96 mg/dL (ref 70–99)
Potassium: 3.9 mmol/L (ref 3.5–5.2)
Sodium: 141 mmol/L (ref 134–144)
Total Protein: 6.6 g/dL (ref 6.0–8.5)
eGFR: 85 mL/min/{1.73_m2} (ref >59)

## 2021-06-13 LAB — LIPID PANEL
Chol/HDL Ratio: 2.4 ratio (ref 0.0–4.4)
Cholesterol, Total: 186 mg/dL (ref 100–199)
HDL: 77 mg/dL (ref >39)
LDL Chol Calc (NIH): 87 mg/dL (ref 0–99)
Triglycerides: 129 mg/dL (ref 0–149)
VLDL Cholesterol Cal: 22 mg/dL (ref 5–40)

## 2021-06-13 LAB — TSH: TSH: 1.6 u[IU]/mL (ref 0.450–4.500)

## 2021-06-13 LAB — HEMOGLOBIN A1C
Est. average glucose Bld gHb Est-mCnc: 111 mg/dL
Hgb A1c MFr Bld: 5.5 % (ref 4.8–5.6)

## 2021-06-13 LAB — VITAMIN D 25 HYDROXY (VIT D DEFICIENCY, FRACTURES): Vit D, 25-Hydroxy: 55.2 ng/mL (ref 30.0–100.0)

## 2021-06-25 ENCOUNTER — Ambulatory Visit
Admission: RE | Admit: 2021-06-25 | Discharge: 2021-06-25 | Disposition: A | Discharge status: Home / Self Care | Payer: 59 | Source: Ambulatory Visit | Attending: Hematology | Admitting: Hematology

## 2021-06-25 DIAGNOSIS — C50411 Malignant neoplasm of upper-outer quadrant of right female breast: Secondary | ICD-10-CM

## 2021-06-27 ENCOUNTER — Ambulatory Visit
Admission: RE | Admit: 2021-06-27 | Discharge: 2021-06-27 | Disposition: A | Discharge status: Home / Self Care | Payer: 59 | Source: Ambulatory Visit | Attending: Hematology | Admitting: Hematology

## 2021-06-27 DIAGNOSIS — C50411 Malignant neoplasm of upper-outer quadrant of right female breast: Secondary | ICD-10-CM

## 2021-08-12 ENCOUNTER — Other Ambulatory Visit: Payer: Self-pay

## 2021-08-28 ENCOUNTER — Encounter (INDEPENDENT_AMBULATORY_CARE_PROVIDER_SITE_OTHER): Payer: Self-pay

## 2021-10-23 ENCOUNTER — Other Ambulatory Visit: Payer: Self-pay | Admitting: Hematology

## 2021-11-21 ENCOUNTER — Encounter: Payer: Self-pay | Admitting: Physician Assistant

## 2021-11-27 ENCOUNTER — Ambulatory Visit: Payer: 59 | Admitting: Nurse Practitioner

## 2021-11-27 ENCOUNTER — Other Ambulatory Visit: Payer: 59

## 2021-12-04 NOTE — Progress Notes (Unsigned)
Alicia Hahn   [phone] [phone]) [phone]   Clinic Follow up Note   Patient Care Team: Alicia Reid, PA-C as PCP - General Hahn, Alicia Pascal, MD as PCP - Advanced Heart Failure (Cardiology) Alicia Rossetti, MD as Consulting Physician (Orthopedic Surgery) Alicia Posey, MD as Consulting Physician (Obstetrics and Gynecology) Alicia Luna, MD as Consulting Physician (General Surgery) Alicia Merle, MD as Consulting Physician (Hematology) Alicia Rudd, MD as Consulting Physician (Radiation Oncology) Alicia Kaufmann, RN as Oncology Nurse Navigator Alicia Germany, RN as Oncology Nurse Navigator Alicia Feeling, NP as Nurse Practitioner (Nurse Practitioner) 12/05/2021  CHIEF COMPLAINT: Follow up right breast cancer   SUMMARY OF ONCOLOGIC HISTORY: Oncology History Overview Note  Cancer Staging Malignant neoplasm of upper-outer quadrant of right breast in female, estrogen receptor positive ([place]) Staging form: Breast, AJCC 8th Edition - Clinical stage from 12/29/2018: Stage IA (cT1b, cN0, cM0, G2, ER+, PR+, HER2+) - Unsigned - Pathologic stage from 01/27/2019: Stage IA (pT1c, pN0, cM0, G3, ER+, PR+, HER2+) - Signed by Alicia Merle, MD on 02/06/2019 Cancer Staging No matching staging information was found for the patient.    Malignant neoplasm of upper-outer quadrant of right breast in female, estrogen receptor positive ([place])  12/22/2018 Mammogram    Diagnostic Mammogram 12/22/18  IMPRESSION: 1. 9 mm mass, with associated calcifications and architectural distortion, in the 11 o'clock position of the right breast, highly suspicious for breast carcinoma. 2. Questionable mass noted in the inferior aspect of the right breast remains equivocal on diagnostic imaging may reflect focal fibroglandular tissue. There is no sonographic correlate.   12/23/2018 Initial Biopsy   Diagnosis 12/23/18 Breast, right, needle core biopsy, 11 o'clock position -  INVASIVE DUCTAL CARCINOMA, SEE COMMENT. - DUCTAL CARCINOMA IN SITU WITH NECROSIS.   12/28/2018 Initial Diagnosis   Malignant neoplasm of upper-outer quadrant of right breast in female, estrogen receptor positive ([place])   01/13/2019 Imaging   MRI Breast 01/13/19  IMPRESSION: 1. Biopsy proven malignancy in the upper-outer right breast. No additional suspicious findings on the right. 2. No MRI evidence of malignancy on the left. 3. No suspicious lymphadenopathy.   01/27/2019 Surgery   RIGHT BREAST LUMPECTOMY WITH RADIOACTIVE SEED AND SENTINEL LYMPH NODE MAPPING and PAC placement by Dr Brantley Stage  01/27/19   01/27/2019 Pathology Results   FINAL MICROSCOPIC DIAGNOSIS: 01/27/19  A. BREAST, RIGHT, LUMPECTOMY:  - Invasive ductal carcinoma, grade 3, spanning 1.3 cm.  - High grade ductal carcinoma in situ with necrosis.  - Biopsy site.  - Final resection margins are negative for carcinoma.  - See oncology table.   B. BREAST, RIGHT ADDITIONAL SUPERIOR MARGIN, EXCISION:  - Fibrocystic change and adenosis.  - No malignancy identified.   C. LYMPH NODE, RIGHT #1, SENTINEL, BIOPSY:  - One of one lymph nodes negative for carcinoma (0/1).   D. LYMPH NODE, RIGHT, SENTINEL, BIOPSY:  - One of one lymph nodes negative for carcinoma (0/1).   E. LYMPH NODE, RIGHT, SENTINEL, BIOPSY:  - One of one lymph nodes negative for carcinoma (0/1).   F. LYMPH NODE, RIGHT, SENTINEL, BIOPSY:  - One of one lymph nodes negative for carcinoma (0/1).       01/27/2019 Cancer Staging   Staging form: Breast, AJCC 8th Edition - Pathologic stage from 01/27/2019: Stage IA (pT1c, pN0, cM0, G3, ER+, PR+, HER2+) - Signed by Alicia Merle, MD on 02/06/2019   02/08/2019 Imaging   CT AP W Contrast 02/08/19  IMPRESSION: 1. Wall thickening of the  gastric antrum may represent gastritis.   Aortic Atherosclerosis (ICD10-I70.0).   02/23/2019 - 01/25/2020 Chemotherapy   Adjuvant weekly Abraxane with trastuzumab-anns (Kanjinti) q3weeks starting  02/23/19. Completed 12 week of Abraxane on 05/11/19. Maintenance trastuzumab-anns (Kanjinti) q3weeks start 05/18/19. Plan to complete 01/25/20.   06/13/2019 - 08/01/2019 Radiation Therapy   Radiation with Dr Alicia Hahn starting 06/13/19-08/01/19   08/2019 -  Anti-estrogen oral therapy   Letrozole, beginning 08/25/2019    09/28/2019 Survivorship   SCP delivered by Alicia Rue, NP      CURRENT THERAPY: Letrozole, starting 08/25/19  INTERVAL HISTORY: [name] returns for follow up as scheduled, last seen by Dr. Burr Medico 05/27/21. Mammogram 06/25/21 was negative. She has been traveling a lot lately. She is due screening breast MRI in December. She continues letrozole, tolerating well with stable joint aches but not pain, and tolerable. Hot flashes and mood are stable off effexor. She continues to have low libido. She feels the right breast has become "hard as a rock" over time and thinks it's scar tissue. Denies lump/mass, nipple discharge/inversion, skin redness/rash or other change.  All other systems were reviewed with the patient and are negative.  MEDICAL HISTORY:  Past Medical History:  Diagnosis Date   Acid reflux    occasionally, and takes pepcid OTC   Anxiety    Breast cancer ([place])    Cancer ([place])    breast cancer   Gastric ulcer    GERD (gastroesophageal reflux disease)    Hyperlipidemia    Joint pain    Kidney stones    Lower extremity edema    Personal history of chemotherapy    Personal history of radiation therapy    Stomach ulcer    Vitamin D deficiency     SURGICAL HISTORY: Past Surgical History:  Procedure Laterality Date   AUGMENTATION MAMMAPLASTY     BREAST IMPLANT EXCHANGE Bilateral 11/15/2020   Procedure: BREAST IMPLANT EXCHANGE;  Surgeon: Alicia Going, DO;  Location: [place] [facility];  Service: Plastics;  Laterality: Bilateral;   BREAST LUMPECTOMY     BREAST LUMPECTOMY WITH RADIOACTIVE SEED AND SENTINEL LYMPH NODE BIOPSY Right 01/27/2019   Procedure: RIGHT  BREAST LUMPECTOMY WITH RADIOACTIVE SEED AND SENTINEL LYMPH NODE MAPPING;  Surgeon: Alicia Luna, MD;  Location: [employer] [facility];  Service: General;  Laterality: Right;   COSMETIC SURGERY Bilateral 2000   breast aug/ lipo   LIPOSUCTION Bilateral 11/15/2020   Procedure: LIPOSUCTION;  Surgeon: Alicia Going, DO;  Location: [place];  Service: Plastics;  Laterality: Bilateral;   MASTOPEXY Bilateral 11/15/2020   Procedure: MASTOPEXY;  Surgeon: Alicia Going, DO;  Location: [place];  Service: Plastics;  Laterality: Bilateral;   PORT-A-CATH REMOVAL Right 04/05/2020   Procedure: REMOVAL PORT-A-CATH;  Surgeon: Alicia Luna, MD;  Location: [place];  Service: General;  Laterality: Right;   PORTACATH PLACEMENT Right 01/27/2019   Procedure: INSERTION PORT-A-CATH WITH ULTRASOUND;  Surgeon: Alicia Luna, MD;  Location: [place];  Service: General;  Laterality: Right;    I have reviewed the social history and family history with the patient and they are unchanged from previous note.  ALLERGIES:  has No Known Allergies.  MEDICATIONS:  Current Outpatient Medications  Medication Sig Dispense Refill   CALCIUM PO Take 1,000 mg by mouth daily.     letrozole (FEMARA) 2.5 MG tablet TAKE 1 TABLET BY MOUTH DAILY 120 tablet 2   Magnesium 125 MG CAPS Take 1 capsule by  mouth daily.      omeprazole (PRILOSEC OTC) 20 MG tablet Take 20 mg by mouth daily. As needed     rosuvastatin (CRESTOR) 20 MG tablet Take 1 tablet (20 mg total) by mouth daily. 90 tablet 2   No current facility-administered medications for this visit.    PHYSICAL EXAMINATION: ECOG PERFORMANCE STATUS: 1 - Symptomatic but completely ambulatory  Vitals:   12/05/21 0958  BP: (!) 147/84  Pulse: 68  Resp: 14  Temp: 97.9 F (36.6 C)  SpO2: 100%   Filed Weights   12/05/21 0958  Weight: 176 lb (79.8 kg)    GENERAL:alert, no distress and  comfortable SKIN: no rash  EYES: sclera clear NECK: without mass LYMPH:  no palpable cervical or supraclavicular lymphadenopathy LUNGS: normal breathing effort HEART: no lower extremity edema ABDOMEN:abdomen soft, non-tender and normal bowel sounds NEURO: alert & oriented x 3 with fluent speech, no focal motor/sensory deficits Breast exam: Symmetrical without nipple discharge or inversion.  S/p right lumpectomy and bilateral reconstruction, incisions completely healed.  Firm right breast likely from radiation.  No palpable mass or nodularity in either breast or axilla that I could appreciate.   LABORATORY DATA:  I have reviewed the data as listed    Latest Ref Rng & Units 12/05/2021    9:33 AM 05/27/2021    2:15 PM 11/26/2020    9:59 AM  CBC  WBC 4.0 - 10.5 K/uL 4.0  4.6  4.9   Hemoglobin 12.0 - 15.0 g/dL 14.6  14.8  14.3   Hematocrit 36.0 - 46.0 % 42.5  43.0  42.2   Platelets 150 - 400 K/uL 177  187  208         Latest Ref Rng & Units 12/05/2021    9:33 AM 06/12/2021   10:18 AM 05/27/2021    2:15 PM  CMP  Glucose 70 - 99 mg/dL 101  96  92   BUN 8 - 23 mg/dL _0 Creatinine 0.44 - 1.00 mg/dL 0.80  0.78  0.81   Sodium 135 - 145 mmol/L 142  141  140   Potassium 3.5 - 5.1 mmol/L 4.0  3.9  4.3   Chloride 98 - 111 mmol/L 106  103  106   CO2 22 - 32 mmol/L _1 Calcium 8.9 - 10.3 mg/dL 10.2  9.6  9.5   Total Protein 6.5 - 8.1 g/dL 7.2  6.6  7.0   Total Bilirubin 0.3 - 1.2 mg/dL 0.5  0.4  0.4   Alkaline Phos 38 - 126 U/L 73  98  76   AST 15 - 41 U/L _2 ALT 0 - 44 U/L 37  27  38       RADIOGRAPHIC STUDIES: I have personally reviewed the radiological images as listed and agreed with the findings in the report. No results found.   ASSESSMENT & PLAN: Alicia Hahn is a 61 y.o. female with      1. Malignant neoplasm of upper-outer quadrant of right breast, invasive ductal carcinoma, pT1cN0M0, stage IA, Triple Positive, Grade 3 -Diagnosed in  12/2018 s/p right lumpectomy and adjuvant radiation.  -She completed adjuvant chemotherapy with Weekly Abraxane and trastuzumab-anns (Kanjinti)  02/23/19-05/11/19 and maintenance Kanjinti (01/25/20). -She started antiestrogen therapy with Letrozole in 08/2019. She is tolerating well with manageable hot flashes and joint aches so far.  -Alicia Hahn is clinically doing well.  Tolerating  letrozole.  Breast exam is benign, labs are normal.  Last mammogram 06/2021 was negative.  Overall no clinical concern for breast cancer recurrence -Continue letrozole and surveillance/high risk screening, next MRI due in December, order placed today.  Will call with results -Follow-up in 6 months, or sooner if needed   2. Hot Flashes  -Secondary to Letrozole  -No change on Effexor so she stopped it  -Stable, mild, and tolerable  3. Osteopenia  -Her 07/2019 DEXA shows osteopenia with lowest T-score -1.3 at right hip.  With FRAX score of 7.5% risk of major osteoporotic fracture and 0.6% risk of hip fracture in the next 10 years -She has been taking joint calcium/vitamin D tablet, not exercising as much -She understands the risk AI can reduce BMD  -repeat DEXA 06/27/2021 showed slightly worsened osteopenia at the spine, T score -1.2 at the left hip, and T score -2.2 at the forearm radius which was not measured previously.  FRAX score unchanged -We discussed various medications including oral Fosamax, Xgeva/Prolia injections, and Zometa infusion (which has the potential benefit of reducing risk of bone metastasis and breast cancer patients).  Potential side effects were reviewed.  She does not want to take medication if not absolutely necessary -Given that her osteopenia and low FRAX score are overall stable, except in a new measurement at the forearm, I recommend to continue calcium and vitamin D and increase weightbearing exercise -If next DEXA is worse, I will recommend medication.  If she wants to try something sooner, she  will call and let me know  4. Estrogen replacement and postmenopausal vaginal atrophy, low libido -S/p Josph Macho procedure, I again recommend to avoid estrogen/hormonal based products  -She continues other lubricant just mineral or coconut oil, or other nonestrogen containing product   5. Genetics:  -myRisk genetic result 06/10/2017 showed no pathogenic mutations, with a 30.5% remaining lifetime risk of breast cancer, this is high risk -Close screening annual mammogram and MRI staggered 6 months apart   Plan: -June mammogram, DEXA, and today's labs reviewed -Continue breast cancer surveillance and letrozole -Screening breast MRI due next month, order placed today -Discussed osteopenia management -Follow-up in 6 months, or sooner if needed   Orders Placed This Encounter  Procedures   MR BREAST BILATERAL W WO CONTRAST INC CAD    Standing Status:   Future    Standing Expiration Date:   12/06/2022    Order Specific Question:   If indicated for the ordered procedure, I authorize the administration of contrast media per Radiology protocol    Answer:   Yes    Order Specific Question:   What is the patient's sedation requirement?    Answer:   No Sedation    Order Specific Question:   Does the patient have a pacemaker or implanted devices?    Answer:   No    Order Specific Question:   Preferred imaging location?    Answer:   [facility] (table limit-550lbs)   All questions were answered. The patient knows to call the clinic with any problems, questions or concerns. No barriers to learning was detected. I spent 20 minutes counseling the patient face to face. The total time spent in the appointment was 30 minutes and more than 50% was on counseling and review of test results.     Alicia Feeling, NP 12/05/21

## 2021-12-05 ENCOUNTER — Encounter: Payer: Self-pay | Admitting: Nurse Practitioner

## 2021-12-05 ENCOUNTER — Inpatient Hospital Stay (HOSPITAL_BASED_OUTPATIENT_CLINIC_OR_DEPARTMENT_OTHER): Payer: 59 | Admitting: Nurse Practitioner

## 2021-12-05 ENCOUNTER — Inpatient Hospital Stay: Payer: 59 | Attending: Nurse Practitioner

## 2021-12-05 VITALS — BP 147/84 | HR 68 | Temp 97.9°F | Resp 14 | Wt 176.0 lb

## 2021-12-05 DIAGNOSIS — C50411 Malignant neoplasm of upper-outer quadrant of right female breast: Secondary | ICD-10-CM | POA: Insufficient documentation

## 2021-12-05 DIAGNOSIS — N951 Menopausal and female climacteric states: Secondary | ICD-10-CM | POA: Diagnosis not present

## 2021-12-05 DIAGNOSIS — Z79811 Long term (current) use of aromatase inhibitors: Secondary | ICD-10-CM | POA: Insufficient documentation

## 2021-12-05 DIAGNOSIS — Z9221 Personal history of antineoplastic chemotherapy: Secondary | ICD-10-CM | POA: Diagnosis not present

## 2021-12-05 DIAGNOSIS — M8580 Other specified disorders of bone density and structure, unspecified site: Secondary | ICD-10-CM | POA: Diagnosis not present

## 2021-12-05 DIAGNOSIS — Z170 Estrogen receptor positive status [ER+]: Secondary | ICD-10-CM | POA: Diagnosis not present

## 2021-12-05 DIAGNOSIS — N952 Postmenopausal atrophic vaginitis: Secondary | ICD-10-CM | POA: Insufficient documentation

## 2021-12-05 DIAGNOSIS — Z923 Personal history of irradiation: Secondary | ICD-10-CM | POA: Diagnosis not present

## 2021-12-05 LAB — CBC WITH DIFFERENTIAL (CANCER CENTER ONLY)
Abs Immature Granulocytes: 0.01 10*3/uL (ref 0.00–0.07)
Basophils Absolute: 0 10*3/uL (ref 0.0–0.1)
Basophils Relative: 1 %
Eosinophils Absolute: 0.1 10*3/uL (ref 0.0–0.5)
Eosinophils Relative: 3 %
HCT: 42.5 % (ref 36.0–46.0)
Hemoglobin: 14.6 g/dL (ref 12.0–15.0)
Immature Granulocytes: 0 %
Lymphocytes Relative: 27 %
Lymphs Abs: 1.1 10*3/uL (ref 0.7–4.0)
MCH: 30.9 pg (ref 26.0–34.0)
MCHC: 34.4 g/dL (ref 30.0–36.0)
MCV: 89.9 fL (ref 80.0–100.0)
Monocytes Absolute: 0.5 10*3/uL (ref 0.1–1.0)
Monocytes Relative: 13 %
Neutro Abs: 2.3 10*3/uL (ref 1.7–7.7)
Neutrophils Relative %: 56 %
Platelet Count: 177 10*3/uL (ref 150–400)
RBC: 4.73 MIL/uL (ref 3.87–5.11)
RDW: 12.4 % (ref 11.5–15.5)
WBC Count: 4 10*3/uL (ref 4.0–10.5)
nRBC: 0 % (ref 0.0–0.2)

## 2021-12-05 LAB — CMP (CANCER CENTER ONLY)
ALT: 37 U/L (ref 0–44)
AST: 27 U/L (ref 15–41)
Albumin: 4.5 g/dL (ref 3.5–5.0)
Alkaline Phosphatase: 73 U/L (ref 38–126)
Anion gap: 8 (ref 5–15)
BUN: 18 mg/dL (ref 8–23)
CO2: 28 mmol/L (ref 22–32)
Calcium: 10.2 mg/dL (ref 8.9–10.3)
Chloride: 106 mmol/L (ref 98–111)
Creatinine: 0.8 mg/dL (ref 0.44–1.00)
GFR, Estimated: >60 mL/min (ref >60)
Glucose, Bld: 101 mg/dL — ABNORMAL HIGH (ref 70–99)
Potassium: 4 mmol/L (ref 3.5–5.1)
Sodium: 142 mmol/L (ref 135–145)
Total Bilirubin: 0.5 mg/dL (ref 0.3–1.2)
Total Protein: 7.2 g/dL (ref 6.5–8.1)

## 2021-12-07 ENCOUNTER — Other Ambulatory Visit: Payer: Self-pay | Admitting: Physician Assistant

## 2021-12-07 DIAGNOSIS — E785 Hyperlipidemia, unspecified: Secondary | ICD-10-CM

## 2021-12-09 NOTE — Telephone Encounter (Signed)
Office visit required for future refills 

## 2021-12-17 ENCOUNTER — Encounter: Payer: 59 | Admitting: Physician Assistant

## 2022-01-06 ENCOUNTER — Other Ambulatory Visit: Payer: Self-pay | Admitting: Nurse Practitioner

## 2022-01-06 DIAGNOSIS — E785 Hyperlipidemia, unspecified: Secondary | ICD-10-CM

## 2022-01-15 ENCOUNTER — Ambulatory Visit
Admission: RE | Admit: 2022-01-15 | Discharge: 2022-01-15 | Disposition: A | Discharge status: Home / Self Care | Payer: 59 | Source: Ambulatory Visit | Attending: Nurse Practitioner | Admitting: Nurse Practitioner

## 2022-01-15 DIAGNOSIS — Z170 Estrogen receptor positive status [ER+]: Secondary | ICD-10-CM

## 2022-01-15 DIAGNOSIS — C50411 Malignant neoplasm of upper-outer quadrant of right female breast: Secondary | ICD-10-CM

## 2022-01-15 MED ORDER — GADOPICLENOL 0.5 MMOL/ML IV SOLN
8.0000 mL | Freq: Once | INTRAVENOUS | Status: AC | PRN
  Administered 2022-01-15: 8 mL via INTRAVENOUS

## 2022-01-18 ENCOUNTER — Other Ambulatory Visit: Payer: Self-pay | Admitting: Nurse Practitioner

## 2022-01-18 DIAGNOSIS — F4329 Adjustment disorder with other symptoms: Secondary | ICD-10-CM

## 2022-01-18 DIAGNOSIS — F5104 Psychophysiologic insomnia: Secondary | ICD-10-CM

## 2022-02-10 NOTE — Progress Notes (Signed)
Patient Care Team: Lorrene Reid, PA-C as PCP - General Bensimhon, Shaune Pascal, MD as PCP - Advanced Heart Failure (Cardiology) Mcarthur Rossetti, MD as Consulting Physician (Orthopedic Surgery) Molli Posey, MD as Consulting Physician (Obstetrics and Gynecology) Erroll Luna, MD as Consulting Physician (General Surgery) Truitt Merle, MD as Consulting Physician (Hematology) Kyung Rudd, MD as Consulting Physician (Radiation Oncology) Mauro Kaufmann, RN as Oncology Nurse Navigator Rockwell Germany, RN as Oncology Nurse Navigator Alla Feeling, NP as Nurse Practitioner (Nurse Practitioner)   CHIEF COMPLAINT: Follow-up left breast cancer  Oncology History Overview Note  Cancer Staging Malignant neoplasm of upper-outer quadrant of right breast in female, estrogen receptor positive [facility]) Staging form: Breast, AJCC 8th Edition - Clinical stage from 12/29/2018: Stage IA (cT1b, cN0, cM0, G2, ER+, PR+, HER2+) - Unsigned - Pathologic stage from 01/27/2019: Stage IA (pT1c, pN0, cM0, G3, ER+, PR+, HER2+) - Signed by Truitt Merle, MD on 02/06/2019 Cancer Staging No matching staging information was found for the patient.    Malignant neoplasm of upper-outer quadrant of right breast in female, estrogen receptor positive ([place])  12/22/2018 Mammogram    Diagnostic Mammogram 12/22/18  IMPRESSION: 1. 9 mm mass, with associated calcifications and architectural distortion, in the 11 o'clock position of the right breast, highly suspicious for breast carcinoma. 2. Questionable mass noted in the inferior aspect of the right breast remains equivocal on diagnostic imaging may reflect focal fibroglandular tissue. There is no sonographic correlate.   12/23/2018 Initial Biopsy   Diagnosis 12/23/18 Breast, right, needle core biopsy, 11 o'clock position - INVASIVE DUCTAL CARCINOMA, SEE COMMENT. - DUCTAL CARCINOMA IN SITU WITH NECROSIS.   12/28/2018 Initial Diagnosis   Malignant neoplasm of  upper-outer quadrant of right breast in female, estrogen receptor positive ([place])   01/13/2019 Imaging   MRI Breast 01/13/19  IMPRESSION: 1. Biopsy proven malignancy in the upper-outer right breast. No additional suspicious findings on the right. 2. No MRI evidence of malignancy on the left. 3. No suspicious lymphadenopathy.   01/27/2019 Surgery   RIGHT BREAST LUMPECTOMY WITH RADIOACTIVE SEED AND SENTINEL LYMPH NODE MAPPING and PAC placement by Dr Brantley Stage  01/27/19   01/27/2019 Pathology Results   FINAL MICROSCOPIC DIAGNOSIS: 01/27/19  A. BREAST, RIGHT, LUMPECTOMY:  - Invasive ductal carcinoma, grade 3, spanning 1.3 cm.  - High grade ductal carcinoma in situ with necrosis.  - Biopsy site.  - Final resection margins are negative for carcinoma.  - See oncology table.   B. BREAST, RIGHT ADDITIONAL SUPERIOR MARGIN, EXCISION:  - Fibrocystic change and adenosis.  - No malignancy identified.   C. LYMPH NODE, RIGHT #1, SENTINEL, BIOPSY:  - One of one lymph nodes negative for carcinoma (0/1).   D. LYMPH NODE, RIGHT, SENTINEL, BIOPSY:  - One of one lymph nodes negative for carcinoma (0/1).   E. LYMPH NODE, RIGHT, SENTINEL, BIOPSY:  - One of one lymph nodes negative for carcinoma (0/1).   F. LYMPH NODE, RIGHT, SENTINEL, BIOPSY:  - One of one lymph nodes negative for carcinoma (0/1).       01/27/2019 Cancer Staging   Staging form: Breast, AJCC 8th Edition - Pathologic stage from 01/27/2019: Stage IA (pT1c, pN0, cM0, G3, ER+, PR+, HER2+) - Signed by Truitt Merle, MD on 02/06/2019   02/08/2019 Imaging   CT AP W Contrast 02/08/19  IMPRESSION: 1. Wall thickening of the gastric antrum may represent gastritis.   Aortic Atherosclerosis (ICD10-I70.0).   02/23/2019 - 01/25/2020 Chemotherapy   Adjuvant weekly Abraxane with  trastuzumab-anns (Kanjinti) q3weeks starting 02/23/19. Completed 12 week of Abraxane on 05/11/19. Maintenance trastuzumab-anns (Kanjinti) q3weeks start 05/18/19. Plan to complete 01/25/20.    06/13/2019 - 08/01/2019 Radiation Therapy   Radiation with Dr Lisbeth Renshaw starting 06/13/19-08/01/19   08/2019 -  Anti-estrogen oral therapy   Letrozole, beginning 08/25/2019    09/28/2019 Survivorship   SCP delivered by Cira Rue, NP       CURRENT THERAPY: Letrozole, starting 08/25/2019  INTERVAL HISTORY Ms. Aquilar returns to discuss medication; last seen by me 12/05/2021. She had been doing OK with manageable joint pain in her legs and wrists that became significantly worse. Grip strength has decreased. She received an injection in both wrists that has helped temporarily but the pain returned. She stopped letrozole 2 weeks ago and leg pain improved but wrists did not. She also stopped crestor thinking it might be causing some of the pain. She also feels anxious and irritable.  Seeing PCP later today. She would like to discuss alternatives.    Screening breast MRI 01/15/2022 was benign.  ROS  All other systems reviewed and negative  Past Medical History:  Diagnosis Date   Acid reflux    occasionally, and takes pepcid OTC   Anxiety    Breast cancer (HCC)    Cancer ([place])    breast cancer   Gastric ulcer    GERD (gastroesophageal reflux disease)    Hyperlipidemia    Joint pain    Kidney stones    Lower extremity edema    Personal history of chemotherapy    Personal history of radiation therapy    Stomach ulcer    Vitamin D deficiency      Past Surgical History:  Procedure Laterality Date   AUGMENTATION MAMMAPLASTY     BREAST IMPLANT EXCHANGE Bilateral 11/15/2020   Procedure: BREAST IMPLANT EXCHANGE;  Surgeon: Wallace Going, DO;  Location: [facility];  Service: Plastics;  Laterality: Bilateral;   BREAST LUMPECTOMY     BREAST LUMPECTOMY WITH RADIOACTIVE SEED AND SENTINEL LYMPH NODE BIOPSY Right 01/27/2019   Procedure: RIGHT BREAST LUMPECTOMY WITH RADIOACTIVE SEED AND SENTINEL LYMPH NODE MAPPING;  Surgeon: Erroll Luna, MD;  Location: [facility];   Service: General;  Laterality: Right;   COSMETIC SURGERY Bilateral 2000   breast aug/ lipo   LIPOSUCTION Bilateral 11/15/2020   Procedure: LIPOSUCTION;  Surgeon: Wallace Going, DO;  Location: [facility];  Service: Plastics;  Laterality: Bilateral;   MASTOPEXY Bilateral 11/15/2020   Procedure: MASTOPEXY;  Surgeon: Wallace Going, DO;  Location: [facility];  Service: Plastics;  Laterality: Bilateral;   PORT-A-CATH REMOVAL Right 04/05/2020   Procedure: REMOVAL PORT-A-CATH;  Surgeon: Erroll Luna, MD;  Location: [facility];  Service: General;  Laterality: Right;   PORTACATH PLACEMENT Right 01/27/2019   Procedure: INSERTION PORT-A-CATH WITH ULTRASOUND;  Surgeon: Erroll Luna, MD;  Location: [facility];  Service: General;  Laterality: Right;     Outpatient Encounter Medications as of 02/11/2022  Medication Sig   CALCIUM PO Take 1,000 mg by mouth daily.   exemestane (AROMASIN) 25 MG tablet Take 1 tablet (25 mg total) by mouth daily after breakfast.   omeprazole (PRILOSEC OTC) 20 MG tablet Take 20 mg by mouth daily. As needed   rosuvastatin (CRESTOR) 20 MG tablet TAKE 1 TABLET BY MOUTH DAILY   [DISCONTINUED] letrozole (FEMARA) 2.5 MG tablet TAKE 1 TABLET BY MOUTH DAILY   Magnesium 125 MG CAPS Take 1 capsule  by mouth daily.    No facility-administered encounter medications on file as of 02/11/2022.     Today's Vitals   02/11/22 1026 02/11/22 1036  BP: 127/86   Pulse: 79   Resp: 14   Temp: (!) 97.5 F (36.4 C)   TempSrc: Oral   SpO2: 100%   Weight: 171 lb 6.4 oz (77.7 kg)   PainSc:  4    Body mass index is 30.36 kg/m.   PHYSICAL EXAM GENERAL:alert, no distress and comfortable SKIN: no rash  EYES: sclera clear NECK: without mass LUNGS:  normal breathing effort NEURO: alert & oriented x 3 with fluent speech MSK: mild swelling and decreased ROM of b/l wrists   CBC    Component Value Date/Time   WBC 4.0  12/05/2021 0933   WBC 7.6 01/29/2019 1032   RBC 4.73 12/05/2021 0933   HGB 14.6 12/05/2021 0933   HGB 15.3 02/21/2020 0901   HCT 42.5 12/05/2021 0933   HCT 45.7 03/29/2020 1212   PLT 177 12/05/2021 0933   PLT 191 02/21/2020 0901   MCV 89.9 12/05/2021 0933   MCV 89 02/21/2020 0901   MCH 30.9 12/05/2021 0933   MCHC 34.4 12/05/2021 0933   RDW 12.4 12/05/2021 0933   RDW 12.6 02/21/2020 0901   LYMPHSABS 1.1 12/05/2021 0933   LYMPHSABS 1.7 02/02/2019 1019   MONOABS 0.5 12/05/2021 0933   EOSABS 0.1 12/05/2021 0933   EOSABS 0.1 02/02/2019 1019   BASOSABS 0.0 12/05/2021 0933   BASOSABS 0.0 02/02/2019 1019     CMP     Component Value Date/Time   NA 142 12/05/2021 0933   NA 141 06/12/2021 1018   K 4.0 12/05/2021 0933   CL 106 12/05/2021 0933   CO2 28 12/05/2021 0933   GLUCOSE 101 (H) 12/05/2021 0933   BUN 18 12/05/2021 0933   BUN 13 06/12/2021 1018   CREATININE 0.80 12/05/2021 0933   CALCIUM 10.2 12/05/2021 0933   PROT 7.2 12/05/2021 0933   PROT 6.6 06/12/2021 1018   ALBUMIN 4.5 12/05/2021 0933   ALBUMIN 4.4 06/12/2021 1018   AST 27 12/05/2021 0933   ALT 37 12/05/2021 0933   ALKPHOS 73 12/05/2021 0933   BILITOT 0.5 12/05/2021 0933   GFRNONAA >60 12/05/2021 0933   GFRAA 78 02/21/2020 0901   GFRAA >60 10/12/2019 1130     ASSESSMENT & PLAN:Tajha Khilee Hendricksen is a 62 y.o. female with    Side effects: joint pain (legs, wrists with decreased grip), anxiety -She has been on letrozole since 08/2019, tolerating well with stable hot flashes and manageable joint pain which has become intolerable  -She reportedly has tenosynovitis in the wrists, which has also worsened.  Receiving injections.  Unclear if progressive symptoms are related to letrozole -She feels more anxious and irritable lately as well, has not improved much since stopping letrozole 2 weeks ago.  She will see PCP about this.  I encouraged her to discuss medications that do not interact with tamoxifen in the event we  change to this in the future -Agree with stopping letrozole due to intolerable side effects.  We discussed alternatives.  I would not try anastrozole at this point.  I discussed the higher efficacy of AI's versus tamoxifen in postmenopausal women and encouraged her to consider exemestane which hopefully will be better on her joint pain. -Briefly discussed side effects of tamoxifen including vaginal discharge, uterine cancer, thrombosis, and the improvement it may have on bone density.  She understands she will continue  annual  pelvic exams on tamoxifen -After discussion she agrees to try exemestane, I prescribed to her pharmacy.  If this is cost prohibitive we will move to tamoxifen -Plan to start in 2 weeks, then phone follow-up in 2 months -Keep surveillance visit in 05/2022 as scheduled  2. Malignant neoplasm of upper-outer quadrant of right breast, invasive ductal carcinoma, pT1cN0M0, stage IA, Triple Positive, Grade 3 -Diagnosed in 12/2018 s/p right lumpectomy and adjuvant radiation.  -She completed adjuvant chemotherapy with Weekly Abraxane and trastuzumab-anns (Kanjinti)  02/23/19-05/11/19 and maintenance Kanjinti (01/25/20). -She started antiestrogen therapy with Letrozole in 08/2019. She is tolerating well with manageable hot flashes and joint aches so far.  -Last mammogram 06/2021 and screening breast MRI 12/2021 were negative. I reviewed MRI with her today. -Continue surveillance/high risk screening -Next f/up in 05/2022 already scheduled   3. Hot Flashes  -Secondary to Letrozole  -No change on Effexor so she stopped it  -Stable, mild, and tolerable -Not discussed today   4. Osteopenia  -Her 07/2019 DEXA shows osteopenia with lowest T-score -1.3 at right hip.  With FRAX score of 7.5% risk of major osteoporotic fracture and 0.6% risk of hip fracture in the next 10 years -She has been taking joint calcium/vitamin D tablet, not exercising as much -She understands the risk AI can reduce BMD   -repeat DEXA 06/27/2021 showed slightly worsened osteopenia at the spine, T score -1.2 at the left hip, and T score -2.2 at the forearm radius which was not measured previously.  FRAX score unchanged -Previously discussed various medications including oral Fosamax, Xgeva/Prolia injections, and Zometa infusion (which has the potential benefit of reducing risk of bone metastasis and breast cancer patients).  Potential side effects were reviewed.  She declined at the time  -Given that her osteopenia and low FRAX score are overall stable, except in a new measurement at the forearm, I recommended to continue calcium and vitamin D and increase weightbearing exercise -If next DEXA is worse, I will recommend medication.  If she wants to try something sooner, she will call and let me know -I discussed the bone strengthening quality of tamoxifen if she changes to that in the future    5. Estrogen replacement and postmenopausal vaginal atrophy, low libido -S/p Josph Macho procedure, I again recommend to avoid estrogen/hormonal based products  -She continues other lubricant just mineral or coconut oil, or other nonestrogen containing product -Not discussed today   6. Genetics:  -myRisk genetic result 06/10/2017 showed no pathogenic mutations, with a 30.5% remaining lifetime risk of breast cancer, this is high risk -Close screening annual mammogram and MRI staggered 6 months apart    PLAN: -Discontinue letrozole -Exemestane 25 mg p.o. once daily, in 2 weeks -Phone follow-up in 03/2022 then next surveillance follow-up in 05/2022 as previously scheduled -Screening MRI reviewed, benign     All questions were answered. The patient knows to call the clinic with any problems, questions or concerns. No barriers to learning were detected. I spent 20 minutes counseling the patient face to face. The total time spent in the appointment was 30 minutes and more than 50% was on counseling, review of test results, and  coordination of care.   Cira Rue, NP-C 02/11/2022

## 2022-02-11 ENCOUNTER — Encounter: Payer: Self-pay | Admitting: Nurse Practitioner

## 2022-02-11 ENCOUNTER — Inpatient Hospital Stay: Payer: 59 | Attending: Nurse Practitioner | Admitting: Nurse Practitioner

## 2022-02-11 ENCOUNTER — Other Ambulatory Visit: Payer: Self-pay

## 2022-02-11 ENCOUNTER — Encounter: Payer: Self-pay | Admitting: Hematology

## 2022-02-11 ENCOUNTER — Ambulatory Visit: Payer: 59 | Admitting: Internal Medicine

## 2022-02-11 ENCOUNTER — Encounter: Payer: Self-pay | Admitting: Internal Medicine

## 2022-02-11 VITALS — BP 127/86 | HR 79 | Temp 97.5°F | Resp 14 | Wt 171.4 lb

## 2022-02-11 VITALS — BP 124/76 | HR 99 | Temp 97.6°F | Resp 16 | Ht 63.0 in | Wt 171.3 lb

## 2022-02-11 DIAGNOSIS — N951 Menopausal and female climacteric states: Secondary | ICD-10-CM | POA: Diagnosis not present

## 2022-02-11 DIAGNOSIS — M25532 Pain in left wrist: Secondary | ICD-10-CM | POA: Insufficient documentation

## 2022-02-11 DIAGNOSIS — R6882 Decreased libido: Secondary | ICD-10-CM | POA: Insufficient documentation

## 2022-02-11 DIAGNOSIS — N952 Postmenopausal atrophic vaginitis: Secondary | ICD-10-CM | POA: Diagnosis not present

## 2022-02-11 DIAGNOSIS — C50411 Malignant neoplasm of upper-outer quadrant of right female breast: Secondary | ICD-10-CM | POA: Insufficient documentation

## 2022-02-11 DIAGNOSIS — Z170 Estrogen receptor positive status [ER+]: Secondary | ICD-10-CM

## 2022-02-11 DIAGNOSIS — G4709 Other insomnia: Secondary | ICD-10-CM

## 2022-02-11 DIAGNOSIS — F419 Anxiety disorder, unspecified: Secondary | ICD-10-CM

## 2022-02-11 DIAGNOSIS — M79604 Pain in right leg: Secondary | ICD-10-CM | POA: Diagnosis not present

## 2022-02-11 DIAGNOSIS — M25531 Pain in right wrist: Secondary | ICD-10-CM | POA: Insufficient documentation

## 2022-02-11 DIAGNOSIS — E782 Mixed hyperlipidemia: Secondary | ICD-10-CM | POA: Diagnosis not present

## 2022-02-11 DIAGNOSIS — M8580 Other specified disorders of bone density and structure, unspecified site: Secondary | ICD-10-CM | POA: Diagnosis not present

## 2022-02-11 DIAGNOSIS — M79605 Pain in left leg: Secondary | ICD-10-CM | POA: Diagnosis not present

## 2022-02-11 MED ORDER — EXEMESTANE 25 MG PO TABS: 25.0000 mg | ORAL_TABLET | Freq: Every day | ORAL | 0 refills | Status: DC

## 2022-02-11 MED ORDER — HYDROXYZINE HCL 10 MG PO TABS: 10.0000 mg | ORAL_TABLET | Freq: Every evening | ORAL | 0 refills | Status: DC | PRN

## 2022-02-11 MED ORDER — ESCITALOPRAM OXALATE 5 MG PO TABS: 5.0000 mg | ORAL_TABLET | Freq: Every day | ORAL | 1 refills | Status: DC

## 2022-02-11 NOTE — Patient Instructions (Addendum)
It was great seeing you today!  Plan discussed at today's visit: -Start Lexapro 5 mg, can take Hydroxyzine 10 mg as needed for sleep  -Hold Crestor for now, will discuss restarting at follow up   Follow up in: 6 weeks   Take care and let us know if you have any questions or concerns prior to your next visit.  Dr. Rosana Berger

## 2022-02-11 NOTE — Progress Notes (Signed)
Established Patient Office Visit  Subjective   Patient ID: Alicia Hahn, female    DOB: 1960-09-10  Age: 62 y.o. MRN: [id number]  Chief Complaint  Patient presents with   Establish Care   Hyperlipidemia    Pt would like to know another option for medication due to crestor would give her muscle pain.    HPI  Patient is here to establish care. She would like to discuss worsening anxiety today.   HLD: -Medications: Crestor 20 mg - but not on currently, stopped on 2 weeks ago due to muscle/joints pains.  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:  -Following with Oncology, note reviewed from earlier today, 02/11/22 -First diagnosed in 2020 s/p lumpectomy and adjuvant radiation/chemotherapy   -Medication switched to Aromasin 25 mg today, plan to start taking over the next few days -Had been on Letrozole but was having some muscle/joint pains, this was discontinued today  GERD: -Takes Prilosec PRN  Osteopenia: -DEXA 6/23 showing t score left forearm -2.2 -On calcium and Vitamin D supplements, vitamin D level from 5/23 normal   Anxiety: -Duration:months  -Anxious mood: yes  -Excessive worrying: yes -Irritability: yes  -Sweating: yes -Panic attacks: yes -Had been taking Effexor for hot flashes but found no benefit and stopped it.      02/11/2022    1:10 PM 06/12/2021    9:48 AM 07/04/2020    2:42 PM 03/29/2020   10:12 AM 02/23/2020    1:29 PM  Depression screen PHQ 2/9  Decreased Interest 1 0 0 1 0  Down, Depressed, Hopeless 1 0 0 1 0  PHQ - 2 Score 2 0 0 2 0  Altered sleeping 1 0 '1 1 1  '$ Tired, decreased energy 1 0 0 2 1  Change in appetite 0 0 0 1 1  Feeling bad or failure about yourself  0 0 0 0 0   Trouble concentrating 0 0 0 0 0  Moving slowly or fidgety/restless 0 0 0 0 0  Suicidal thoughts 0 0 0 0 0  PHQ-9 Score 4 0 '1 6 3  '$ Difficult doing work/chores Somewhat difficult Not difficult at all  Not difficult at all     Health Maintenance: -Blood work UTD -Mammogram MRI 12/23 Birads-2  -Colonoscopy 5/21, repeat in 10 years -Pap 5/22 negative   Patient Active Problem List   Diagnosis Date Noted   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 ([place]) 06/12/2021   Osteopenia 06/12/2021   Pain of left hand 11/05/2020   History of breast cancer 08/08/2020   Acquired absence of breast 08/08/2020   Class 1 obesity with serious comorbidity and body mass index (BMI) of 30.0 to 30.9 in adult 05/02/2020   Effects of radiation 03/23/2020   Postoperative breast asymmetry 03/23/2020   Elevated liver enzymes 04/25/2019   Hyperlipidemia 04/25/2019   Chemoprophylaxis 04/25/2019   Port-A-Cath in place 03/30/2019   Cervical radiculopathy at C7 on L  02/02/2019   Psychophysiological insomnia 02/02/2019   Numbness and tingling of hand-  C7 distribution L hand 02/02/2019   Stress and adjustment reaction 02/02/2019   Genetic testing 12/30/2018   Malignant neoplasm of upper-outer quadrant of right  breast in female, estrogen receptor positive ([place]) 12/28/2018   Malignant tumor of breast ([place]) 12/22/2018   Family history of breast cancer in sister(age33) and mother 11/02/2018   Plantar fasciitis of left foot 11/02/2018   Muscle ache of extremity-bilateral calf muscles 11/02/2018   Impingement syndrome of right shoulder 05/11/2017   Past Medical History:  Diagnosis Date   Acid reflux    occasionally, and takes pepcid OTC   Anxiety    Breast cancer ([place])    Cancer ([place])    breast cancer   Depression    Recent   Gastric ulcer    GERD (gastroesophageal reflux disease)    Hyperlipidemia    Joint pain    Kidney stones    Lower extremity edema    Neuromuscular disorder (H[facility])    Recent  muscle pain   Personal history of chemotherapy    Personal history of radiation therapy    Stomach ulcer    Vitamin D deficiency    Past Surgical History:  Procedure Laterality Date   AUGMENTATION MAMMAPLASTY     BREAST IMPLANT EXCHANGE Bilateral 11/15/2020   Procedure: BREAST IMPLANT EXCHANGE;  Surgeon: Wallace Going, DO;  Location: [facility];  Service: Plastics;  Laterality: Bilateral;   BREAST LUMPECTOMY     BREAST LUMPECTOMY WITH RADIOACTIVE SEED AND SENTINEL LYMPH NODE BIOPSY Right 01/27/2019   Procedure: RIGHT BREAST LUMPECTOMY WITH RADIOACTIVE SEED AND SENTINEL LYMPH NODE MAPPING;  Surgeon: Erroll Luna, MD;  Location: [facility];  Service: General;  Laterality: Right;   COSMETIC SURGERY Bilateral 2000   breast aug/ lipo   LIPOSUCTION Bilateral 11/15/2020   Procedure: LIPOSUCTION;  Surgeon: Wallace Going, DO;  Location: [facility];  Service: Plastics;  Laterality: Bilateral;   MASTOPEXY Bilateral 11/15/2020   Procedure: MASTOPEXY;  Surgeon: Wallace Going, DO;  Location: [facility];  Service: Plastics;  Laterality: Bilateral;   PORT-A-CATH REMOVAL Right 04/05/2020   Procedure: REMOVAL PORT-A-CATH;  Surgeon: Erroll Luna, MD;  Location: [facility];  Service: General;  Laterality: Right;   PORTACATH PLACEMENT Right 01/27/2019   Procedure: INSERTION PORT-A-CATH WITH ULTRASOUND;  Surgeon: Erroll Luna, MD;  Location: [facility];  Service: General;  Laterality: Right;   Social History   Tobacco Use   Smoking status: Former    Packs/day: 0.25    Years: 5.00    Total pack years: 1.25    Types: Cigarettes    Quit date: 01/20/1978    Years since quitting: 44.0   Smokeless tobacco: Never   Tobacco comments:    1982  Vaping Use   Vaping Use: Never used  Substance Use Topics   Alcohol use: Yes    Alcohol/week: 6.0 standard drinks of alcohol    Types: 6 Glasses of  wine per week    Comment: occasional   Drug use: No   Social History   Socioeconomic History   Marital status: Married    Spouse name: Not on file   Number of children: 2   Years of education: Not on file   Highest education level: Not on file  Occupational History   Occupation: retired  Tobacco Use   Smoking status: Former    Packs/day: 0.25    Years: 5.00    Total pack years: 1.25    Types: Cigarettes    Quit date: 01/20/1978    Years since quitting: 44.0   Smokeless tobacco: Never   Tobacco comments:  1982  Vaping Use   Vaping Use: Never used  Substance and Sexual Activity   Alcohol use: Yes    Alcohol/week: 6.0 standard drinks of alcohol    Types: 6 Glasses of wine per week    Comment: occasional   Drug use: No   Sexual activity: Yes    Birth control/protection: Post-menopausal, None  Other Topics Concern   Not on file  Social History Narrative   Not on file   Social Determinants of Health   Financial Resource Strain: Not on file  Food Insecurity: Not on file  Transportation Needs: Not on file  Physical Activity: Not on file  Stress: Not on file  Social Connections: Not on file  Intimate Partner Violence: Not on file   Family Status  Relation Name Status   Mother Mickel Baas Alive   Father Danny Alive   Sister Amy Alive   Brother  Alive   MGM  Deceased   Mat Aunt Network engineer (Not Specified)   MGF Laurena Bering (Not Specified)   Neg Hx  (Not Specified)   Family History  Problem Relation Age of Onset   Diabetes Mother    Heart disease Mother    Hyperlipidemia Mother    Breast cancer Mother 62   Cancer Mother        breast DCIS   Hearing loss Mother    Varicose Veins Mother    Cancer Father 26       prostate cancer    Diabetes Father    Hypertension Father    Hyperlipidemia Father    Heart disease Father    Hearing loss Father    Breast cancer Sister 62   Cancer Sister 86       breast cancer    Cancer Maternal Aunt 55       breast cancer    Cancer  Maternal Grandfather        colon cancer   Colon cancer Maternal Grandfather    Esophageal cancer Neg Hx    Rectal cancer Neg Hx    Stomach cancer Neg Hx    No Known Allergies    Review of Systems  All other systems reviewed and are negative.     Objective:     BP 124/76   Pulse 99   Temp 97.6 F (36.4 C) (Oral)   Resp 16   Ht '5\' 3"'$  (1.6 m)   Wt 171 lb 4.8 oz (77.7 kg)   SpO2 100%   BMI 30.34 kg/m  BP Readings from Last 3 Encounters:  02/11/22 124/76  02/11/22 127/86  12/05/21 (!) 147/84   Wt Readings from Last 3 Encounters:  02/11/22 171 lb 4.8 oz (77.7 kg)  02/11/22 171 lb 6.4 oz (77.7 kg)  12/05/21 176 lb (79.8 kg)      Physical Exam Constitutional:      Appearance: Normal appearance.  HENT:     Head: Normocephalic and atraumatic.  Eyes:     Conjunctiva/sclera: Conjunctivae normal.  Cardiovascular:     Rate and Rhythm: Normal rate and regular rhythm.  Pulmonary:     Effort: Pulmonary effort is normal.     Breath sounds: Normal breath sounds.  Musculoskeletal:     Right lower leg: No edema.     Left lower leg: No edema.  Skin:    General: Skin is warm and dry.  Neurological:     General: No focal deficit present.     Mental Status: She is alert. Mental status is at  baseline.  Psychiatric:        Mood and Affect: Mood normal.        Behavior: Behavior normal.      No results found for any visits on 02/11/22.  Last CBC Lab Results  Component Value Date   WBC 4.0 12/05/2021   HGB 14.6 12/05/2021   HCT 42.5 12/05/2021   MCV 89.9 12/05/2021   MCH 30.9 12/05/2021   RDW 12.4 12/05/2021   PLT 177 00/37/0488   Last metabolic panel Lab Results  Component Value Date   GLUCOSE 101 (H) 12/05/2021   NA 142 12/05/2021   K 4.0 12/05/2021   CL 106 12/05/2021   CO2 28 12/05/2021   BUN 18 12/05/2021   CREATININE 0.80 12/05/2021   GFRNONAA >60 12/05/2021   CALCIUM 10.2 12/05/2021   PROT 7.2 12/05/2021   ALBUMIN 4.5 12/05/2021   LABGLOB 2.2  06/12/2021   AGRATIO 2.0 06/12/2021   BILITOT 0.5 12/05/2021   ALKPHOS 73 12/05/2021   AST 27 12/05/2021   ALT 37 12/05/2021   ANIONGAP 8 12/05/2021   Last lipids Lab Results  Component Value Date   CHOL 186 06/12/2021   HDL 77 06/12/2021   LDLCALC 87 06/12/2021   TRIG 129 06/12/2021   CHOLHDL 2.4 06/12/2021   Last hemoglobin A1c Lab Results  Component Value Date   HGBA1C 5.5 06/12/2021   Last thyroid functions Lab Results  Component Value Date   TSH 1.600 06/12/2021   T3TOTAL 88 02/02/2019   Last vitamin D Lab Results  Component Value Date   VD25OH 55.2 06/12/2021   Last vitamin B12 and Folate Lab Results  Component Value Date   [id number] 03/29/2020      The 10-year ASCVD risk score (Arnett DK, et al., 2019) is: 3.4%    Assessment & Plan:   1. Anxiety/Other insomnia: Will start low dose Lexapro 5 mg daily. Patient also having trouble sleeping, will prescribe Hydroxyzine 10 mg to use as needed for anxiety and insomnia as well. Follow up in 6 weeks.   - escitalopram (LEXAPRO) 5 MG tablet; Take 1 tablet (5 mg total) by mouth daily.  Dispense: 30 tablet; Refill: 1 - hydrOXYzine (ATARAX) 10 MG tablet; Take 1 tablet (10 mg total) by mouth at bedtime as needed for anxiety.  Dispense: 30 tablet; Refill: 0  2. Mixed hyperlipidemia: Reviewed labs with the patient from May, cholesterol was controlled then but had been on Crestor 20 mg which she has stopped for the last 2 weeks due to ongoing muscle/joint pains. Will continue to hold for another month now that she has a new chemo pill and we will re-evaluate in 6 weeks, most likely restart.   3. Malignant neoplasm of upper-outer quadrant of right breast in female, estrogen receptor positive ([place]): Following with Oncology, note reviewed from 02/11/22. Now on Aromasin which is new, thinking the Letrozole was causing the joint pains. Follow up scheduled in May.    Return in about 6 weeks (around 03/25/2022).    Teodora Medici, DO

## 2022-03-23 NOTE — Progress Notes (Unsigned)
Established Patient Office Visit  Subjective   Patient ID: Alicia Hahn, female    DOB: 1960-09-13  Age: 62 y.o. MRN: [id number]  No chief complaint on file.   HPI  Patient is here to follow up on chronic medical conditions.  HLD: -Medications: Nothing  -Failed Meds: Crestor 20 mg - but not on currently, stopped on 2 weeks ago due to muscle/joints pains.  -Last lipid panel: Lipid Panel - was on statin at the time    Component Value Date/Time   CHOL 186 06/12/2021 1018   TRIG 129 06/12/2021 1018   HDL 77 06/12/2021 1018   CHOLHDL 2.4 06/12/2021 1018   LDLCALC 87 06/12/2021 1018   LABVLDL 22 06/12/2021 1018   The 10-year ASCVD risk score (Arnett DK, et al., 2019) is: 3.4%   Values used to calculate the score:     Age: 78 years     Sex: Female     Is Non-Hispanic African American: No     Diabetic: No     Tobacco smoker: No     Systolic Blood Pressure: [id number] mmHg     Is BP treated: No     HDL Cholesterol: 77 mg/dL     Total Cholesterol: 186 mg/dL  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:  -Following with Oncology, note reviewed from 02/11/22 -First diagnosed in 2020 s/p lumpectomy and adjuvant radiation/chemotherapy   -Medication switched to Aromasin 25 mg  -Had been on Letrozole but was having some muscle/joint pains, this was discontinued today  GERD: -Takes Prilosec PRN  Osteopenia: -DEXA 6/23 showing t score left forearm -2.2 -On calcium and Vitamin D supplements, vitamin D level from 5/23 normal   Anxiety: -Duration:months  -Anxious mood: yes  -Excessive worrying: yes -Irritability: yes  -Sweating: yes -Panic attacks: yes -Had been taking Effexor for hot flashes but found no benefit and stopped it.  -Started on Lexapro 5 mg and Hydroxzyine PRN     02/11/2022    1:10 PM 06/12/2021    9:48 AM 07/04/2020    2:42 PM 03/29/2020   10:12 AM 02/23/2020    1:29 PM  Depression screen PHQ 2/9  Decreased Interest 1 0 0 1 0  Down, Depressed, Hopeless 1 0 0 1  0  PHQ - 2 Score 2 0 0 2 0  Altered sleeping 1 0 '1 1 1  '$ Tired, decreased energy 1 0 0 2 1  Change in appetite 0 0 0 1 1  Feeling bad or failure about yourself  0 0 0 0 0  Trouble concentrating 0 0 0 0 0  Moving slowly or fidgety/restless 0 0 0 0 0  Suicidal thoughts 0 0 0 0 0  PHQ-9 Score 4 0 '1 6 3  '$ Difficult doing work/chores Somewhat difficult Not difficult at all  Not difficult at all     Health Maintenance: -Blood work UTD -Mammogram MRI 12/23 Birads-2  -Colonoscopy 5/21, repeat in 10 years -Pap 5/22 negative   Patient Active Problem List   Diagnosis Date Noted   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 ([place]) 06/12/2021   Osteopenia 06/12/2021   Pain of left hand 11/05/2020   History of breast cancer 08/08/2020   Acquired absence of breast 08/08/2020   Class 1 obesity with serious comorbidity and body mass index (BMI) of 30.0 to 30.9 in adult 05/02/2020   Effects of radiation 03/23/2020   Postoperative breast asymmetry 03/23/2020   Elevated liver enzymes 04/25/2019   Hyperlipidemia 04/25/2019   Chemoprophylaxis  04/25/2019   Port-A-Cath in place 03/30/2019   Cervical radiculopathy at C7 on L  02/02/2019   Psychophysiological insomnia 02/02/2019   Numbness and tingling of hand-  C7 distribution L hand 02/02/2019   Stress and adjustment reaction 02/02/2019   Genetic testing 12/30/2018   Malignant neoplasm of upper-outer quadrant of right breast in female, estrogen receptor positive ([place]) 12/28/2018   Malignant tumor of breast ([place]) 12/22/2018   Family history of breast cancer in sister(age33) and mother 11/02/2018   Plantar fasciitis of left foot 11/02/2018   Muscle ache of extremity-bilateral calf muscles 11/02/2018   Impingement syndrome of right shoulder 05/11/2017   Past Medical History:  Diagnosis Date   Acid reflux    occasionally, and takes pepcid OTC   Anxiety    Breast cancer ([place])    Cancer ([place])    breast cancer   Depression    Recent   Gastric  ulcer    GERD (gastroesophageal reflux disease)    Hyperlipidemia    Joint pain    Kidney stones    Lower extremity edema    Neuromuscular disorder ([place])    Recent muscle pain   Personal history of chemotherapy    Personal history of radiation therapy    Stomach ulcer    Vitamin D deficiency    Past Surgical History:  Procedure Laterality Date   AUGMENTATION MAMMAPLASTY     BREAST IMPLANT EXCHANGE Bilateral 11/15/2020   Procedure: BREAST IMPLANT EXCHANGE;  Surgeon: Wallace Going, DO;  Location: [facility];  Service: Plastics;  Laterality: Bilateral;   BREAST LUMPECTOMY     BREAST LUMPECTOMY WITH RADIOACTIVE SEED AND SENTINEL LYMPH NODE BIOPSY Right 01/27/2019   Procedure: RIGHT BREAST LUMPECTOMY WITH RADIOACTIVE SEED AND SENTINEL LYMPH NODE MAPPING;  Surgeon: Erroll Luna, MD;  Location: [facility];  Service: General;  Laterality: Right;   COSMETIC SURGERY Bilateral 2000   breast aug/ lipo   LIPOSUCTION Bilateral 11/15/2020   Procedure: LIPOSUCTION;  Surgeon: Wallace Going, DO;  Location: [facility];  Service: Plastics;  Laterality: Bilateral;   MASTOPEXY Bilateral 11/15/2020   Procedure: MASTOPEXY;  Surgeon: Wallace Going, DO;  Location: [facility];  Service: Plastics;  Laterality: Bilateral;   PORT-A-CATH REMOVAL Right 04/05/2020   Procedure: REMOVAL PORT-A-CATH;  Surgeon: Erroll Luna, MD;  Location: [facility];  Service: General;  Laterality: Right;   PORTACATH PLACEMENT Right 01/27/2019   Procedure: INSERTION PORT-A-CATH WITH ULTRASOUND;  Surgeon: Erroll Luna, MD;  Location: [facility];  Service: General;  Laterality: Right;   Social History   Tobacco Use   Smoking status: Former    Packs/day: 0.25    Years: 5.00    Total pack years: 1.25    Types: Cigarettes    Quit date: 01/20/1978    Years since quitting: 44.2   Smokeless tobacco: Never   Tobacco  comments:    1982  Vaping Use   Vaping Use: Never used  Substance Use Topics   Alcohol use: Yes    Alcohol/week: 6.0 standard drinks of alcohol    Types: 6 Glasses of wine per week    Comment: occasional   Drug use: No   Social History   Socioeconomic History   Marital status: Married    Spouse name: Not on file   Number of children: 2   Years of education: Not on file   Highest education level: Not on file  Occupational History  Occupation: retired  Tobacco Use   Smoking status: Former    Packs/day: 0.25    Years: 5.00    Total pack years: 1.25    Types: Cigarettes    Quit date: 01/20/1978    Years since quitting: 44.2   Smokeless tobacco: Never   Tobacco comments:    1982  Vaping Use   Vaping Use: Never used  Substance and Sexual Activity   Alcohol use: Yes    Alcohol/week: 6.0 standard drinks of alcohol    Types: 6 Glasses of wine per week    Comment: occasional   Drug use: No   Sexual activity: Yes    Birth control/protection: Post-menopausal, None  Other Topics Concern   Not on file  Social History Narrative   Not on file   Social Determinants of Health   Financial Resource Strain: Not on file  Food Insecurity: Not on file  Transportation Needs: Not on file  Physical Activity: Not on file  Stress: Not on file  Social Connections: Not on file  Intimate Partner Violence: Not on file   Family Status  Relation Name Status   Mother Mickel Baas Alive   Father Danny Alive   Sister Amy Alive   Brother  Alive   MGM  Deceased   Mat Aunt Network engineer (Not Specified)   MGF Laurena Bering (Not Specified)   Neg Hx  (Not Specified)   Family History  Problem Relation Age of Onset   Diabetes Mother    Heart disease Mother    Hyperlipidemia Mother    Breast cancer Mother 81   Cancer Mother        breast DCIS   Hearing loss Mother    Varicose Veins Mother    Cancer Father 18       prostate cancer    Diabetes Father    Hypertension Father    Hyperlipidemia Father    Heart  disease Father    Hearing loss Father    Breast cancer Sister 76   Cancer Sister 53       breast cancer    Cancer Maternal Aunt 55       breast cancer    Cancer Maternal Grandfather        colon cancer   Colon cancer Maternal Grandfather    Esophageal cancer Neg Hx    Rectal cancer Neg Hx    Stomach cancer Neg Hx    No Known Allergies    Review of Systems  All other systems reviewed and are negative.     Objective:     There were no vitals taken for this visit. BP Readings from Last 3 Encounters:  02/11/22 124/76  02/11/22 127/86  12/05/21 (!) 147/84   Wt Readings from Last 3 Encounters:  02/11/22 171 lb 4.8 oz (77.7 kg)  02/11/22 171 lb 6.4 oz (77.7 kg)  12/05/21 176 lb (79.8 kg)      Physical Exam Constitutional:      Appearance: Normal appearance.  HENT:     Head: Normocephalic and atraumatic.  Eyes:     Conjunctiva/sclera: Conjunctivae normal.  Cardiovascular:     Rate and Rhythm: Normal rate and regular rhythm.  Pulmonary:     Effort: Pulmonary effort is normal.     Breath sounds: Normal breath sounds.  Musculoskeletal:     Right lower leg: No edema.     Left lower leg: No edema.  Skin:    General: Skin is warm and dry.  Neurological:  General: No focal deficit present.     Mental Status: She is alert. Mental status is at baseline.  Psychiatric:        Mood and Affect: Mood normal.        Behavior: Behavior normal.      No results found for any visits on 03/24/22.  Last CBC Lab Results  Component Value Date   WBC 4.0 12/05/2021   HGB 14.6 12/05/2021   HCT 42.5 12/05/2021   MCV 89.9 12/05/2021   MCH 30.9 12/05/2021   RDW 12.4 12/05/2021   PLT 177 [id number]   Last metabolic panel Lab Results  Component Value Date   GLUCOSE 101 (H) 12/05/2021   NA 142 12/05/2021   K 4.0 12/05/2021   CL 106 12/05/2021   CO2 28 12/05/2021   BUN 18 12/05/2021   CREATININE 0.80 12/05/2021   GFRNONAA >60 12/05/2021   CALCIUM 10.2 12/05/2021    PROT 7.2 12/05/2021   ALBUMIN 4.5 12/05/2021   LABGLOB 2.2 06/12/2021   AGRATIO 2.0 06/12/2021   BILITOT 0.5 12/05/2021   ALKPHOS 73 12/05/2021   AST 27 12/05/2021   ALT 37 12/05/2021   ANIONGAP 8 12/05/2021   Last lipids Lab Results  Component Value Date   CHOL 186 06/12/2021   HDL 77 06/12/2021   LDLCALC 87 06/12/2021   TRIG 129 06/12/2021   CHOLHDL 2.4 06/12/2021   Last hemoglobin A1c Lab Results  Component Value Date   HGBA1C 5.5 06/12/2021   Last thyroid functions Lab Results  Component Value Date   TSH 1.600 06/12/2021   T3TOTAL 88 02/02/2019   Last vitamin D Lab Results  Component Value Date   VD25OH 55.2 06/12/2021   Last vitamin B12 and Folate Lab Results  Component Value Date   [id number] 03/29/2020      The 10-year ASCVD risk score (Arnett DK, et al., 2019) is: 3.4%    Assessment & Plan:   1. Anxiety/Other insomnia: Will start low dose Lexapro 5 mg daily. Patient also having trouble sleeping, will prescribe Hydroxyzine 10 mg to use as needed for anxiety and insomnia as well. Follow up in 6 weeks.   - escitalopram (LEXAPRO) 5 MG tablet; Take 1 tablet (5 mg total) by mouth daily.  Dispense: 30 tablet; Refill: 1 - hydrOXYzine (ATARAX) 10 MG tablet; Take 1 tablet (10 mg total) by mouth at bedtime as needed for anxiety.  Dispense: 30 tablet; Refill: 0  2. Mixed hyperlipidemia: Reviewed labs with the patient from May, cholesterol was controlled then but had been on Crestor 20 mg which she has stopped for the last 2 weeks due to ongoing muscle/joint pains. Will continue to hold for another month now that she has a new chemo pill and we will re-evaluate in 6 weeks, most likely restart.   3. Malignant neoplasm of upper-outer quadrant of right breast in female, estrogen receptor positive ([place]): Following with Oncology, note reviewed from 02/11/22. Now on Aromasin which is new, thinking the Letrozole was causing the joint pains. Follow up scheduled in May.     No follow-ups on file.    Teodora Medici, DO

## 2022-03-24 ENCOUNTER — Encounter: Payer: Self-pay | Admitting: Internal Medicine

## 2022-03-24 ENCOUNTER — Ambulatory Visit: Payer: 59 | Admitting: Internal Medicine

## 2022-03-24 VITALS — BP 122/70 | HR 87 | Resp 16 | Ht 63.0 in | Wt 172.0 lb

## 2022-03-24 DIAGNOSIS — G4709 Other insomnia: Secondary | ICD-10-CM

## 2022-03-24 DIAGNOSIS — E782 Mixed hyperlipidemia: Secondary | ICD-10-CM

## 2022-03-24 DIAGNOSIS — F419 Anxiety disorder, unspecified: Secondary | ICD-10-CM | POA: Diagnosis not present

## 2022-03-24 MED ORDER — ESCITALOPRAM OXALATE 5 MG PO TABS: 5.0000 mg | ORAL_TABLET | Freq: Every day | ORAL | 1 refills | Status: DC

## 2022-03-24 MED ORDER — TRAZODONE HCL 50 MG PO TABS: 50.0000 mg | ORAL_TABLET | Freq: Every evening | ORAL | 3 refills | Status: DC | PRN

## 2022-03-24 MED ORDER — TRAZODONE HCL 50 MG PO TABS: 50.0000 mg | ORAL_TABLET | Freq: Every evening | ORAL | 1 refills | Status: DC | PRN

## 2022-03-24 NOTE — Patient Instructions (Addendum)
It was great seeing you today!  Plan discussed at today's visit: -Continue Lexapro at 5 mg -Discontinue Hydroxyzine  -Can take Trazodone 50-100 mg at night but can increase risk on serotonin syndrome  -Try Crestor for 2 weeks to see if muscle pains restart  -Plan for fasting labs at next appointment   Follow up in: 3 months   Take care and let us know if you have any questions or concerns prior to your next visit.  Dr. Rosana Berger  Serotonin Syndrome Serotonin is a chemical that helps to control several functions in the body. This chemical is also called a neurotransmitter. It controls: Brain and nerve cell function. Mood and emotions. Memory. Eating. Sleeping. Sexual activity. Stress response. Having too much serotonin in your body can cause serotonin syndrome. This condition can be harmful to your brain and nerve cells. This can be a life-threatening condition. What are the causes? This condition may be caused by taking medicines or drugs that increase the level of serotonin in your body, such as: Antidepressant medicines. Migraine medicines. Certain pain medicines. Certain drugs, including ecstasy, LSD, cocaine, and amphetamines. Over-the-counter cough or cold medicines that contain dextromethorphan. Certain herbal supplements, including St. John's wort, ginseng, and nutmeg. This condition usually occurs when you take these medicines or drugs together, but it can also happen with a high dose of a single medicine or drug. What increases the risk? You are more likely to develop this condition if: You just started taking a medicine or drug that increases the level of serotonin in the body. You recently increased the dose of a medicine or drug that increases the level of serotonin in the body. You take more than one medicine or drug that increases the level of serotonin in the body. What are the signs or symptoms? Symptoms of this condition usually start within several hours of  taking a medicine or drug. Symptoms may be mild or severe. Mild symptoms include: Sweating. Restlessness or agitation. Muscle twitching or stiffness. Rapid heart rate. Nausea, vomiting, or diarrhea. Shivering or goose bumps. Confusion. Severe symptoms include: Irregular heartbeat. Seizures. Loss of consciousness. High fever. How is this diagnosed? This condition may be diagnosed based on: Your medical history. A physical exam. Your prior use of drugs and medicines. Blood or urine tests. These may be used to rule out other causes of your symptoms. How is this treated? The treatment for this condition depends on the severity of your symptoms. For mild cases, stopping the medicine or drug that caused your condition is usually all that is needed. For moderate to severe cases, treatment in a hospital may be needed to prevent or treat life-threatening symptoms. Treatment may include: Medicines to control your symptoms. IV fluids. Actions to support your breathing. Treatments to control your body temperature. Follow these instructions at home: Medicines  Take over-the-counter and prescription medicines only as told by your health care provider. Check with your health care provider before you start taking any new prescriptions, over-the-counter medicines, herbs, or supplements. Do not combine any medicines that can cause this condition. Lifestyle  Maintain a healthy lifestyle. Eat a healthy diet that includes plenty of vegetables, fruits, whole grains, low-fat dairy products, and lean protein. Do not eat a lot of foods that are high in fat, added sugars, or salt. Get the right amount and quality of sleep. Most adults need 7-9 hours of sleep each night. Make time to exercise, even if it is only for short periods of time. Most adults should  exercise for at least 150 minutes each week. Do not drink alcohol. Do not use illegal drugs. Do not take medicines for reasons other than they are  prescribed. General instructions Do not use any products that contain nicotine or tobacco. These products include cigarettes, chewing tobacco, and vaping devices, such as e-cigarettes. If you need help quitting, ask your health care provider. Contact a health care provider if: Your symptoms do not improve or they get worse. Get help right away if: You have worsening confusion, severe headache, chest pain, high fever, seizures, or loss of consciousness. You experience serious side effects of medicine, such as swelling of your face, lips, tongue, or throat. These symptoms may be an emergency. Get help right away. Call 911. Do not wait to see if the symptoms will go away. Do not drive yourself to the hospital. Also, get help right away if: You have serious thoughts about hurting yourself or others. Take one of these steps if you feel like you may hurt yourself or others, or have thoughts about taking your own life: Go to your nearest emergency room. Call 911. Call the [name] at [phone] or 988. This is open 24 hours a day. Text the Crisis Text Line at (865) 823-9203. Summary Serotonin is a chemical that helps to control several functions in the body. High levels of serotonin in the body can cause serotonin syndrome, which can be life-threatening. This condition may be caused by taking medicines or drugs that increase the level of serotonin in your body. Treatment depends on the severity of your symptoms. For mild cases, stopping the medicine or drug that caused your condition is usually all that is needed. Check with your health care provider before you start taking any new prescriptions, over-the-counter medicines, herbs, or supplements. This information is not intended to replace advice given to you by your health care provider. Make sure you discuss any questions you have with your health care provider. Document Revised: 03/28/2021 Document Reviewed:  03/28/2021 Elsevier Patient Education  [place].

## 2022-03-26 ENCOUNTER — Other Ambulatory Visit: Payer: Self-pay

## 2022-03-26 DIAGNOSIS — F419 Anxiety disorder, unspecified: Secondary | ICD-10-CM

## 2022-03-26 MED ORDER — ESCITALOPRAM OXALATE 5 MG PO TABS: 5.0000 mg | ORAL_TABLET | Freq: Every day | ORAL | 1 refills | Status: DC

## 2022-03-28 ENCOUNTER — Other Ambulatory Visit: Payer: Self-pay

## 2022-03-28 DIAGNOSIS — F419 Anxiety disorder, unspecified: Secondary | ICD-10-CM

## 2022-03-28 NOTE — Telephone Encounter (Signed)
New pharmacy

## 2022-04-05 ENCOUNTER — Encounter: Payer: Self-pay | Admitting: Internal Medicine

## 2022-04-07 ENCOUNTER — Other Ambulatory Visit: Payer: Self-pay | Admitting: Internal Medicine

## 2022-04-07 DIAGNOSIS — E785 Hyperlipidemia, unspecified: Secondary | ICD-10-CM

## 2022-04-07 DIAGNOSIS — F419 Anxiety disorder, unspecified: Secondary | ICD-10-CM

## 2022-04-07 MED ORDER — ROSUVASTATIN CALCIUM 20 MG PO TABS: 20.0000 mg | ORAL_TABLET | Freq: Every day | ORAL | 1 refills | Status: DC

## 2022-04-07 MED ORDER — ESCITALOPRAM OXALATE 5 MG PO TABS: 5.0000 mg | ORAL_TABLET | Freq: Every day | ORAL | 1 refills | Status: DC

## 2022-04-10 ENCOUNTER — Encounter: Payer: Self-pay | Admitting: Nurse Practitioner

## 2022-04-10 ENCOUNTER — Inpatient Hospital Stay: Payer: 59 | Admitting: Nurse Practitioner

## 2022-04-10 ENCOUNTER — Telehealth: Payer: Self-pay | Admitting: Nurse Practitioner

## 2022-04-10 NOTE — Telephone Encounter (Signed)
Per 3/21 IB reached out to patient, patient is going to be out of town so wanted to cancel her appointment and come in at her appointment with Burr Medico.

## 2022-04-10 NOTE — Telephone Encounter (Signed)
Contacted patient to scheduled appointments. Left message with appointment details and a call back number if patient had any questions or could not accommodate the time we provided.   

## 2022-04-11 ENCOUNTER — Other Ambulatory Visit: Payer: Self-pay | Admitting: Hematology

## 2022-04-11 ENCOUNTER — Other Ambulatory Visit: Payer: Self-pay

## 2022-04-11 MED ORDER — TAMOXIFEN CITRATE 20 MG PO TABS: 20.0000 mg | ORAL_TABLET | Freq: Every day | ORAL | 3 refills | Status: DC

## 2022-04-17 ENCOUNTER — Other Ambulatory Visit: Payer: Self-pay | Admitting: Nurse Practitioner

## 2022-04-17 DIAGNOSIS — N898 Other specified noninflammatory disorders of vagina: Secondary | ICD-10-CM

## 2022-04-17 MED ORDER — ESTRADIOL 0.1 MG/GM VA CREA: TOPICAL_CREAM | VAGINAL | 6 refills | Status: DC

## 2022-04-29 ENCOUNTER — Telehealth: Payer: 59 | Admitting: Nurse Practitioner

## 2022-05-30 ENCOUNTER — Other Ambulatory Visit: Payer: Self-pay

## 2022-05-30 DIAGNOSIS — C50411 Malignant neoplasm of upper-outer quadrant of right female breast: Secondary | ICD-10-CM

## 2022-06-01 NOTE — Assessment & Plan Note (Signed)
-  Her 07/2019 DEXA shows osteopenia with lowest T-score -1.3 at right hip.  -She understands the risk AI can reduce BMD  -repeat DEXA in 07/2021

## 2022-06-01 NOTE — Assessment & Plan Note (Signed)
pT1cN0M0, stage IA, Triple Positive, Grade 3 -Diagnosed in 12/2018 s/p right lumpectomy and adjuvant radiation.  -She completed adjuvant chemotherapy with Weekly Abraxane and trastuzumab-anns (Kanjinti)  02/23/19-05/11/19 and maintenance Kanjinti (01/25/20). -She started antiestrogen therapy with Letrozole in 08/2019. She is tolerating well with manageable hot flashes and joint aches so far.  -Last mammogram 06/2021 and screening breast MRI 12/2021 were negative. I reviewed MRI with her today. -Continue surveillance/high risk screening

## 2022-06-02 ENCOUNTER — Inpatient Hospital Stay: Payer: 59 | Admitting: Hematology

## 2022-06-02 ENCOUNTER — Encounter: Payer: Self-pay | Admitting: Hematology

## 2022-06-02 ENCOUNTER — Other Ambulatory Visit: Payer: Self-pay

## 2022-06-02 ENCOUNTER — Inpatient Hospital Stay: Payer: 59 | Attending: Hematology

## 2022-06-02 VITALS — BP 148/89 | HR 73 | Temp 98.1°F | Resp 14 | Wt 178.9 lb

## 2022-06-02 DIAGNOSIS — Z923 Personal history of irradiation: Secondary | ICD-10-CM | POA: Diagnosis not present

## 2022-06-02 DIAGNOSIS — Z170 Estrogen receptor positive status [ER+]: Secondary | ICD-10-CM | POA: Insufficient documentation

## 2022-06-02 DIAGNOSIS — Z79810 Long term (current) use of selective estrogen receptor modulators (SERMs): Secondary | ICD-10-CM | POA: Insufficient documentation

## 2022-06-02 DIAGNOSIS — C50411 Malignant neoplasm of upper-outer quadrant of right female breast: Secondary | ICD-10-CM

## 2022-06-02 DIAGNOSIS — Z9221 Personal history of antineoplastic chemotherapy: Secondary | ICD-10-CM | POA: Diagnosis not present

## 2022-06-02 DIAGNOSIS — M8580 Other specified disorders of bone density and structure, unspecified site: Secondary | ICD-10-CM | POA: Insufficient documentation

## 2022-06-02 DIAGNOSIS — M85851 Other specified disorders of bone density and structure, right thigh: Secondary | ICD-10-CM

## 2022-06-02 LAB — CBC WITH DIFFERENTIAL (CANCER CENTER ONLY)
Abs Immature Granulocytes: 0.01 10*3/uL (ref 0.00–0.07)
Basophils Absolute: 0.1 10*3/uL (ref 0.0–0.1)
Basophils Relative: 1 %
Eosinophils Absolute: 0.2 10*3/uL (ref 0.0–0.5)
Eosinophils Relative: 4 %
HCT: 43.6 % (ref 36.0–46.0)
Hemoglobin: 14.8 g/dL (ref 12.0–15.0)
Immature Granulocytes: 0 %
Lymphocytes Relative: 26 %
Lymphs Abs: 1.3 10*3/uL (ref 0.7–4.0)
MCH: 31.1 pg (ref 26.0–34.0)
MCHC: 33.9 g/dL (ref 30.0–36.0)
MCV: 91.6 fL (ref 80.0–100.0)
Monocytes Absolute: 0.6 10*3/uL (ref 0.1–1.0)
Monocytes Relative: 11 %
Neutro Abs: 3 10*3/uL (ref 1.7–7.7)
Neutrophils Relative %: 58 %
Platelet Count: 189 10*3/uL (ref 150–400)
RBC: 4.76 MIL/uL (ref 3.87–5.11)
RDW: 12.4 % (ref 11.5–15.5)
WBC Count: 5.1 10*3/uL (ref 4.0–10.5)
nRBC: 0 % (ref 0.0–0.2)

## 2022-06-02 LAB — CMP (CANCER CENTER ONLY)
ALT: 20 U/L (ref 0–44)
AST: 17 U/L (ref 15–41)
Albumin: 4.5 g/dL (ref 3.5–5.0)
Alkaline Phosphatase: 63 U/L (ref 38–126)
Anion gap: 8 (ref 5–15)
BUN: 17 mg/dL (ref 8–23)
CO2: 26 mmol/L (ref 22–32)
Calcium: 8.9 mg/dL (ref 8.9–10.3)
Chloride: 109 mmol/L (ref 98–111)
Creatinine: 0.72 mg/dL (ref 0.44–1.00)
GFR, Estimated: >60 mL/min (ref >60)
Glucose, Bld: 99 mg/dL (ref 70–99)
Potassium: 4 mmol/L (ref 3.5–5.1)
Sodium: 143 mmol/L (ref 135–145)
Total Bilirubin: 0.4 mg/dL (ref 0.3–1.2)
Total Protein: 7 g/dL (ref 6.5–8.1)

## 2022-06-02 NOTE — Progress Notes (Signed)
[facility]   Telephone:([phone]) [phone] [phone]) [phone]   Clinic Follow up Note   Patient Care Team: Margarita Mail, DO as PCP - General (Internal Medicine) Bensimhon, Bevelyn Buckles, MD as PCP - Advanced Heart Failure (Cardiology) Kathryne Hitch, MD as Consulting Physician (Orthopedic Surgery) Richarda Overlie, MD as Consulting Physician (Obstetrics and Gynecology) Harriette Bouillon, MD as Consulting Physician (General Surgery) Malachy Mood, MD as Consulting Physician (Hematology) Dorothy Puffer, MD as Consulting Physician (Radiation Oncology) Pershing Proud, RN as Oncology Nurse Navigator Donnelly Angelica, RN as Oncology Nurse Navigator Pollyann Samples, NP as Nurse Practitioner (Nurse Practitioner)  Date of Service:  06/02/2022  CHIEF COMPLAINT: f/u of left breast cancer   CURRENT THERAPY:    Adjuvant Tamoxifen started 02/2022  ASSESSMENT:  Alicia Hahn is a 62 y.o. female with   Malignant neoplasm of upper-outer quadrant of right breast in female, estrogen receptor positive (HCC) pT1cN0M0, stage IA, Triple Positive, Grade 3 -Diagnosed in 12/2018 s/p right lumpectomy and adjuvant radiation.  -She completed adjuvant chemotherapy with Weekly Abraxane and trastuzumab-anns (Kanjinti)  02/23/19-05/11/19 and maintenance Kanjinti (01/25/20). -She started antiestrogen therapy with Letrozole in 08/2019.  Due to poor tolerance, it was switched to Exemestane in Jan 2024.  She did not tolerated well, and it was changed to tamoxifen in February 2024.  She has been tolerating that very well so far. -Last mammogram 06/2021 and screening breast MRI 12/2021 were negative.  -She is clinically doing well, exam was unremarkable, no clinical concern for recurrence. -Continue surveillance/high risk screening.    Osteopenia -Her 07/2019 DEXA shows osteopenia with lowest T-score -1.3 at right hip.  -She understands the risk AI can reduce BMD  -repeated DEXA in 07/2021 showed  osteopenia, with T-score -2.2 in left forearm, no high risk for fracture.    PLAN: -Lab reviewed -Mammogram 06/27/2022 -Continue Tamoxifen -lab and f/u in 6 months     SUMMARY OF ONCOLOGIC HISTORY: Oncology History Overview Note  Cancer Staging Malignant neoplasm of upper-outer quadrant of right breast in female, estrogen receptor positive (HCC) Staging form: Breast, AJCC 8th Edition - Clinical stage from 12/29/2018: Stage IA (cT1b, cN0, cM0, G2, ER+, PR+, HER2+) - Unsigned - Pathologic stage from 01/27/2019: Stage IA (pT1c, pN0, cM0, G3, ER+, PR+, HER2+) - Signed by Malachy Mood, MD on 02/06/2019 Cancer Staging No matching staging information was found for the patient.    Malignant neoplasm of upper-outer quadrant of right breast in female, estrogen receptor positive (HCC)  12/22/2018 Mammogram    Diagnostic Mammogram 12/22/18  IMPRESSION: 1. 9 mm mass, with associated calcifications and architectural distortion, in the 11 o'clock position of the right breast, highly suspicious for breast carcinoma. 2. Questionable mass noted in the inferior aspect of the right breast remains equivocal on diagnostic imaging may reflect focal fibroglandular tissue. There is no sonographic correlate.   12/23/2018 Initial Biopsy   Diagnosis 12/23/18 Breast, right, needle core biopsy, 11 o'clock position - INVASIVE DUCTAL CARCINOMA, SEE COMMENT. - DUCTAL CARCINOMA IN SITU WITH NECROSIS.   12/28/2018 Initial Diagnosis   Malignant neoplasm of upper-outer quadrant of right breast in female, estrogen receptor positive (HCC)   01/13/2019 Imaging   MRI Breast 01/13/19  IMPRESSION: 1. Biopsy proven malignancy in the upper-outer right breast. No additional suspicious findings on the right. 2. No MRI evidence of malignancy on the left. 3. No suspicious lymphadenopathy.   01/27/2019 Surgery   RIGHT BREAST LUMPECTOMY WITH RADIOACTIVE SEED AND SENTINEL LYMPH NODE MAPPING and PAC  placement by Dr Luisa Hart   01/27/19   01/27/2019 Pathology Results   FINAL MICROSCOPIC DIAGNOSIS: 01/27/19  A. BREAST, RIGHT, LUMPECTOMY:  - Invasive ductal carcinoma, grade 3, spanning 1.3 cm.  - High grade ductal carcinoma in situ with necrosis.  - Biopsy site.  - Final resection margins are negative for carcinoma.  - See oncology table.   B. BREAST, RIGHT ADDITIONAL SUPERIOR MARGIN, EXCISION:  - Fibrocystic change and adenosis.  - No malignancy identified.   C. LYMPH NODE, RIGHT #1, SENTINEL, BIOPSY:  - One of one lymph nodes negative for carcinoma (0/1).   D. LYMPH NODE, RIGHT, SENTINEL, BIOPSY:  - One of one lymph nodes negative for carcinoma (0/1).   E. LYMPH NODE, RIGHT, SENTINEL, BIOPSY:  - One of one lymph nodes negative for carcinoma (0/1).   F. LYMPH NODE, RIGHT, SENTINEL, BIOPSY:  - One of one lymph nodes negative for carcinoma (0/1).       01/27/2019 Cancer Staging   Staging form: Breast, AJCC 8th Edition - Pathologic stage from 01/27/2019: Stage IA (pT1c, pN0, cM0, G3, ER+, PR+, HER2+) - Signed by Malachy Mood, MD on 02/06/2019   02/08/2019 Imaging   CT AP W Contrast 02/08/19  IMPRESSION: 1. Wall thickening of the gastric antrum may represent gastritis.   Aortic Atherosclerosis (ICD10-I70.0).   02/23/2019 - 01/25/2020 Chemotherapy   Adjuvant weekly Abraxane with trastuzumab-anns (Kanjinti) q3weeks starting 02/23/19. Completed 12 week of Abraxane on 05/11/19. Maintenance trastuzumab-anns (Kanjinti) q3weeks start 05/18/19. Plan to complete 01/25/20.   06/13/2019 - 08/01/2019 Radiation Therapy   Radiation with Dr Mitzi Hansen starting 06/13/19-08/01/19   08/2019 -  Anti-estrogen oral therapy   Letrozole, beginning 08/25/2019    09/28/2019 Survivorship   SCP delivered by Santiago Glad, NP       INTERVAL HISTORY:  Alicia Hahn is here for a follow up of left breast cancer. She was last seen by NP Lacie on 02/11/2022. She presents to the clinic alone. Pt stated that she has no joint pain since starting the  Tamoxifen, but does have hot flashes that are tolerable.   All other systems were reviewed with the patient and are negative.  MEDICAL HISTORY:  Past Medical History:  Diagnosis Date   Acid reflux    occasionally, and takes pepcid OTC   Anxiety    Breast cancer (HCC)    Cancer (HCC)    breast cancer   Depression    Recent   Gastric ulcer    GERD (gastroesophageal reflux disease)    Hyperlipidemia    Joint pain    Kidney stones    Lower extremity edema    Neuromuscular disorder (HCC)    Recent muscle pain   Personal history of chemotherapy    Personal history of radiation therapy    Stomach ulcer    Vitamin D deficiency     SURGICAL HISTORY: Past Surgical History:  Procedure Laterality Date   AUGMENTATION MAMMAPLASTY     BREAST IMPLANT EXCHANGE Bilateral 11/15/2020   Procedure: BREAST IMPLANT EXCHANGE;  Surgeon: Peggye Form, DO;  Location: [facility];  Service: Plastics;  Laterality: Bilateral;   BREAST LUMPECTOMY     BREAST LUMPECTOMY WITH RADIOACTIVE SEED AND SENTINEL LYMPH NODE BIOPSY Right 01/27/2019   Procedure: RIGHT BREAST LUMPECTOMY WITH RADIOACTIVE SEED AND SENTINEL LYMPH NODE MAPPING;  Surgeon: Harriette Bouillon, MD;  Location: [facility];  Service: General;  Laterality: Right;   COSMETIC SURGERY Bilateral 2000   breast aug/ lipo  LIPOSUCTION Bilateral 11/15/2020   Procedure: LIPOSUCTION;  Surgeon: Peggye Form, DO;  Location: [facility];  Service: Plastics;  Laterality: Bilateral;   MASTOPEXY Bilateral 11/15/2020   Procedure: MASTOPEXY;  Surgeon: Peggye Form, DO;  Location: [facility];  Service: Plastics;  Laterality: Bilateral;   PORT-A-CATH REMOVAL Right 04/05/2020   Procedure: REMOVAL PORT-A-CATH;  Surgeon: Harriette Bouillon, MD;  Location: [facility];  Service: General;  Laterality: Right;   PORTACATH PLACEMENT Right 01/27/2019   Procedure: INSERTION  PORT-A-CATH WITH ULTRASOUND;  Surgeon: Harriette Bouillon, MD;  Location: [facility];  Service: General;  Laterality: Right;    I have reviewed the social history and family history with the patient and they are unchanged from previous note.  ALLERGIES:  has No Known Allergies.  MEDICATIONS:  Current Outpatient Medications  Medication Sig Dispense Refill   tamoxifen (NOLVADEX) 20 MG tablet Take 1 tablet (20 mg total) by mouth daily. 30 tablet 3   CALCIUM PO Take 1,000 mg by mouth daily.     escitalopram (LEXAPRO) 5 MG tablet Take 1 tablet (5 mg total) by mouth daily. 90 tablet 1   estradiol (ESTRACE VAGINAL) 0.1 MG/GM vaginal cream Use a fingertip size amount of cream on your finger and place slightly past the vaginal entrance 3 times a week at bedtime. Do not use the applicator if this comes with the cream. 42.5 g 6   omeprazole (PRILOSEC OTC) 20 MG tablet Take 20 mg by mouth daily. As needed     rosuvastatin (CRESTOR) 20 MG tablet Take 1 tablet (20 mg total) by mouth daily. 90 tablet 1   traZODone (DESYREL) 50 MG tablet Take 1 tablet (50 mg total) by mouth at bedtime as needed for sleep. Can take 50-100 mg as needed at night for insomnia. 90 tablet 1   No current facility-administered medications for this visit.    PHYSICAL EXAMINATION: ECOG PERFORMANCE STATUS: 0 - Asymptomatic  Vitals:   06/02/22 1348  BP: (!) 148/89  Pulse: 73  Resp: 14  Temp: 98.1 F (36.7 C)  SpO2: 100%   Wt Readings from Last 3 Encounters:  06/02/22 178 lb 14.4 oz (81.1 kg)  03/24/22 172 lb (78 kg)  02/11/22 171 lb 4.8 oz (77.7 kg)     GENERAL:alert, no distress and comfortable SKIN: skin color normal, no rashes or significant lesions EYES: normal, Conjunctiva are pink and non-injected, sclera clear  NEURO: alert & oriented x 3 with fluent speech NECK:(-)  supple, thyroid normal size, non-tender, without nodularity LYMPH:  (-) no palpable lymphadenopathy in the cervical, axillary   ABDOMEN:(-) abdomen soft, (-) non-tender and(-) normal bowel sound BREAST: RT breast lumpectomy. Breast is firm and mild edema. No palpable mass. Lt breast no palpable mass. Breast exam benign    LABORATORY DATA:  I have reviewed the data as listed    Latest Ref Rng & Units 06/02/2022    1:22 PM 12/05/2021    9:33 AM 05/27/2021    2:15 PM  CBC  WBC 4.0 - 10.5 K/uL 5.1  4.0  4.6   Hemoglobin 12.0 - 15.0 g/dL 16.1  09.6  04.5   Hematocrit 36.0 - 46.0 % 43.6  42.5  43.0   Platelets 150 - 400 K/uL 189  177  187         Latest Ref Rng & Units 06/02/2022    1:22 PM 12/05/2021    9:33 AM 06/12/2021   10:18 AM  CMP  Glucose 70 - 99 mg/dL 99  161  96   BUN 8 - 23 mg/dL 17  18  13    Creatinine 0.44 - 1.00 mg/dL 0.96  0.45  4.09   Sodium 135 - 145 mmol/L 143  142  141   Potassium 3.5 - 5.1 mmol/L 4.0  4.0  3.9   Chloride 98 - 111 mmol/L 109  106  103   CO2 22 - 32 mmol/L 26  28  24    Calcium 8.9 - 10.3 mg/dL 8.9  81.1  9.6   Total Protein 6.5 - 8.1 g/dL 7.0  7.2  6.6   Total Bilirubin 0.3 - 1.2 mg/dL 0.4  0.5  0.4   Alkaline Phos 38 - 126 U/L 63  73  98   AST 15 - 41 U/L 17  27  21    ALT 0 - 44 U/L 20  37  27       RADIOGRAPHIC STUDIES: I have personally reviewed the radiological images as listed and agreed with the findings in the report. No results found.    Orders Placed This Encounter  Procedures   MM 3D SCREENING MAMMOGRAM BILATERAL BREAST    Standing Status:   Future    Standing Expiration Date:   06/02/2023    Order Specific Question:   Reason for Exam (SYMPTOM  OR DIAGNOSIS REQUIRED)    Answer:   routine screening    Order Specific Question:   Preferred imaging location?    Answer:   [facility]   All questions were answered. The patient knows to call the clinic with any problems, questions or concerns. No barriers to learning was detected. The total time spent in the appointment was 25 minutes.     Malachy Mood, MD 06/02/2022   Carolin Coy, CMA, am  acting as scribe for Malachy Mood, MD.   I have reviewed the above documentation for accuracy and completeness, and I agree with the above.

## 2022-06-04 ENCOUNTER — Other Ambulatory Visit: Payer: Self-pay | Admitting: Hematology and Oncology

## 2022-06-06 ENCOUNTER — Other Ambulatory Visit: Payer: Self-pay | Admitting: Hematology

## 2022-06-06 DIAGNOSIS — Z170 Estrogen receptor positive status [ER+]: Secondary | ICD-10-CM

## 2022-06-23 NOTE — Progress Notes (Unsigned)
Established Patient Office Visit  Subjective   Patient ID: Alicia Hahn, female    DOB: February 18, 1960  Age: 62 y.o. MRN: [id number]  No chief complaint on file.   HPI  Patient is here to follow up on chronic medical conditions.  HLD: -Medications: Restarted on Crestor 20 mg at LOV -Last lipid panel: Lipid Panel - was on statin at the time    Component Value Date/Time   CHOL 186 06/12/2021 1018   TRIG 129 06/12/2021 1018   HDL 77 06/12/2021 1018   CHOLHDL 2.4 06/12/2021 1018   LDLCALC 87 06/12/2021 1018   LABVLDL 22 06/12/2021 1018   The 10-year ASCVD risk score (Arnett DK, et al., 2019) is: 5.4%   Values used to calculate the score:     Age: 12 years     Sex: Female     Is Non-Hispanic African American: No     Diabetic: No     Tobacco smoker: No     Systolic Blood Pressure: 148 mmHg     Is BP treated: No     HDL Cholesterol: 77 mg/dL     Total Cholesterol: 186 mg/dL  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:  -Following with Oncology, note reviewed from 02/11/22 -First diagnosed in 2020 s/p lumpectomy and adjuvant radiation/chemotherapy   -Medication switched to Aromasin 25 mg  -Had been on Letrozole but was having some muscle/joint pains, this was discontinued today  GERD: -Takes Prilosec 20 mg PRN  Osteopenia: -DEXA 6/23 showing t score left forearm -2.2 -On calcium and Vitamin D supplements, vitamin D level from 5/23 normal   Anxiety: -Duration:months  -Anxious mood: yes  -Excessive worrying: yes -Irritability: yes  -Sweating: yes -Panic attacks: yes -Had been taking Effexor for hot flashes but found no benefit and stopped it.  -Started on Lexapro 5 mg and Hydroxzyine PRN. Doing much better on the Lexapro. Does not feel any added benefit from the Hydroxyzine. Has been taking Trazodone 50 mg PRN for sleep which works well.      03/24/2022   12:58 PM 02/11/2022    1:10 PM 06/12/2021    9:48 AM 07/04/2020    2:42 PM 03/29/2020   10:12 AM  Depression  screen PHQ 2/9  Decreased Interest 0 1 0 0 1  Down, Depressed, Hopeless 0 1 0 0 1  PHQ - 2 Score 0 2 0 0 2  Altered sleeping 0 1 0 1 1  Tired, decreased energy 0 1 0 0 2  Change in appetite 0 0 0 0 1  Feeling bad or failure about yourself  0 0 0 0 0  Trouble concentrating 0 0 0 0 0  Moving slowly or fidgety/restless 0 0 0 0 0  Suicidal thoughts 0 0 0 0 0  PHQ-9 Score 0 4 0 1 6  Difficult doing work/chores  Somewhat difficult Not difficult at all  Not difficult at all   Health Maintenance: -Blood work UTD -Mammogram MRI 12/23 Birads-2  -Colonoscopy 5/21, repeat in 10 years -Pap 5/22 negative   Patient Active Problem List   Diagnosis Date Noted   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 (HCC) 06/12/2021   Osteopenia 06/12/2021   Pain of left hand 11/05/2020   History of breast cancer 08/08/2020   Acquired absence of breast 08/08/2020   Class 1 obesity with serious comorbidity and body mass index (BMI) of 30.0 to 30.9 in adult 05/02/2020   Effects of radiation 03/23/2020   Postoperative breast asymmetry 03/23/2020  Elevated liver enzymes 04/25/2019   Hyperlipidemia 04/25/2019   Chemoprophylaxis 04/25/2019   Port-A-Cath in place 03/30/2019   Cervical radiculopathy at C7 on L  02/02/2019   Psychophysiological insomnia 02/02/2019   Numbness and tingling of hand-  C7 distribution L hand 02/02/2019   Stress and adjustment reaction 02/02/2019   Genetic testing 12/30/2018   Malignant neoplasm of upper-outer quadrant of right breast in female, estrogen receptor positive (HCC) 12/28/2018   Malignant tumor of breast (HCC) 12/22/2018   Family history of breast cancer in sister(age33) and mother 11/02/2018   Plantar fasciitis of left foot 11/02/2018   Muscle ache of extremity-bilateral calf muscles 11/02/2018   Impingement syndrome of right shoulder 05/11/2017   Past Medical History:  Diagnosis Date   Acid reflux    occasionally, and takes pepcid OTC   Anxiety    Breast  cancer (HCC)    Cancer (HCC)    breast cancer   Depression    Recent   Gastric ulcer    GERD (gastroesophageal reflux disease)    Hyperlipidemia    Joint pain    Kidney stones    Lower extremity edema    Neuromuscular disorder (HCC)    Recent muscle pain   Personal history of chemotherapy    Personal history of radiation therapy    Stomach ulcer    Vitamin D deficiency    Past Surgical History:  Procedure Laterality Date   AUGMENTATION MAMMAPLASTY     BREAST IMPLANT EXCHANGE Bilateral 11/15/2020   Procedure: BREAST IMPLANT EXCHANGE;  Surgeon: Peggye Form, DO;  Location: [facility];  Service: Plastics;  Laterality: Bilateral;   BREAST LUMPECTOMY     BREAST LUMPECTOMY WITH RADIOACTIVE SEED AND SENTINEL LYMPH NODE BIOPSY Right 01/27/2019   Procedure: RIGHT BREAST LUMPECTOMY WITH RADIOACTIVE SEED AND SENTINEL LYMPH NODE MAPPING;  Surgeon: Harriette Bouillon, MD;  Location: [facility];  Service: General;  Laterality: Right;   COSMETIC SURGERY Bilateral 2000   breast aug/ lipo   LIPOSUCTION Bilateral 11/15/2020   Procedure: LIPOSUCTION;  Surgeon: Peggye Form, DO;  Location: [facility];  Service: Plastics;  Laterality: Bilateral;   MASTOPEXY Bilateral 11/15/2020   Procedure: MASTOPEXY;  Surgeon: Peggye Form, DO;  Location: [facility];  Service: Plastics;  Laterality: Bilateral;   PORT-A-CATH REMOVAL Right 04/05/2020   Procedure: REMOVAL PORT-A-CATH;  Surgeon: Harriette Bouillon, MD;  Location: [facility];  Service: General;  Laterality: Right;   PORTACATH PLACEMENT Right 01/27/2019   Procedure: INSERTION PORT-A-CATH WITH ULTRASOUND;  Surgeon: Harriette Bouillon, MD;  Location: [facility];  Service: General;  Laterality: Right;   Social History   Tobacco Use   Smoking status: Former    Packs/day: 0.25    Years: 5.00    Additional pack years: 0.00    Total pack years: 1.25     Types: Cigarettes    Quit date: 01/20/1978    Years since quitting: 44.4   Smokeless tobacco: Never   Tobacco comments:    1982  Vaping Use   Vaping Use: Never used  Substance Use Topics   Alcohol use: Yes    Alcohol/week: 6.0 standard drinks of alcohol    Types: 6 Glasses of wine per week    Comment: occasional   Drug use: No   Social History   Socioeconomic History   Marital status: Married    Spouse name: Not on file   Number of children: 2  Years of education: Not on file   Highest education level: Not on file  Occupational History   Occupation: retired  Tobacco Use   Smoking status: Former    Packs/day: 0.25    Years: 5.00    Additional pack years: 0.00    Total pack years: 1.25    Types: Cigarettes    Quit date: 01/20/1978    Years since quitting: 44.4   Smokeless tobacco: Never   Tobacco comments:    1982  Vaping Use   Vaping Use: Never used  Substance and Sexual Activity   Alcohol use: Yes    Alcohol/week: 6.0 standard drinks of alcohol    Types: 6 Glasses of wine per week    Comment: occasional   Drug use: No   Sexual activity: Yes    Birth control/protection: Post-menopausal, None  Other Topics Concern   Not on file  Social History Narrative   Not on file   Social Determinants of Health   Financial Resource Strain: Not on file  Food Insecurity: Not on file  Transportation Needs: Not on file  Physical Activity: Not on file  Stress: Not on file  Social Connections: Not on file  Intimate Partner Violence: Not on file   Family Status  Relation Name Status   Mother Vernona Rieger Alive   Father Danny Alive   Sister Amy Alive   Brother  Alive   MGM  Deceased   Mat Aunt Conservation officer, historic buildings (Not Specified)   MGF Elroy Channel (Not Specified)   Neg Hx  (Not Specified)   Family History  Problem Relation Age of Onset   Diabetes Mother    Heart disease Mother    Hyperlipidemia Mother    Breast cancer Mother 70   Cancer Mother        breast DCIS   Hearing loss Mother     Varicose Veins Mother    Cancer Father 48       prostate cancer    Diabetes Father    Hypertension Father    Hyperlipidemia Father    Heart disease Father    Hearing loss Father    Breast cancer Sister 41   Cancer Sister 32       breast cancer    Cancer Maternal Aunt 55       breast cancer    Cancer Maternal Grandfather        colon cancer   Colon cancer Maternal Grandfather    Esophageal cancer Neg Hx    Rectal cancer Neg Hx    Stomach cancer Neg Hx    No Known Allergies    Review of Systems  All other systems reviewed and are negative.     Objective:     There were no vitals taken for this visit. BP Readings from Last 3 Encounters:  06/02/22 (!) 148/89  03/24/22 122/70  02/11/22 124/76   Wt Readings from Last 3 Encounters:  06/02/22 178 lb 14.4 oz (81.1 kg)  03/24/22 172 lb (78 kg)  02/11/22 171 lb 4.8 oz (77.7 kg)      Physical Exam Constitutional:      Appearance: Normal appearance.  HENT:     Head: Normocephalic and atraumatic.  Eyes:     Conjunctiva/sclera: Conjunctivae normal.  Cardiovascular:     Rate and Rhythm: Normal rate and regular rhythm.  Pulmonary:     Effort: Pulmonary effort is normal.     Breath sounds: Normal breath sounds.  Musculoskeletal:     Right lower  leg: No edema.     Left lower leg: No edema.  Skin:    General: Skin is warm and dry.  Neurological:     General: No focal deficit present.     Mental Status: She is alert. Mental status is at baseline.  Psychiatric:        Mood and Affect: Mood normal.        Behavior: Behavior normal.      No results found for any visits on 06/24/22.  Last CBC Lab Results  Component Value Date   WBC 5.1 06/02/2022   HGB 14.8 06/02/2022   HCT 43.6 06/02/2022   MCV 91.6 06/02/2022   MCH 31.1 06/02/2022   RDW 12.4 06/02/2022   PLT 189 06/02/2022   Last metabolic panel Lab Results  Component Value Date   GLUCOSE 99 06/02/2022   NA 143 06/02/2022   K 4.0 06/02/2022   CL 109  06/02/2022   CO2 26 06/02/2022   BUN 17 06/02/2022   CREATININE 0.72 06/02/2022   GFRNONAA >60 06/02/2022   CALCIUM 8.9 06/02/2022   PROT 7.0 06/02/2022   ALBUMIN 4.5 06/02/2022   LABGLOB 2.2 06/12/2021   AGRATIO 2.0 06/12/2021   BILITOT 0.4 06/02/2022   ALKPHOS 63 06/02/2022   AST 17 06/02/2022   ALT 20 06/02/2022   ANIONGAP 8 06/02/2022   Last lipids Lab Results  Component Value Date   CHOL 186 06/12/2021   HDL 77 06/12/2021   LDLCALC 87 06/12/2021   TRIG 129 06/12/2021   CHOLHDL 2.4 06/12/2021   Last hemoglobin A1c Lab Results  Component Value Date   HGBA1C 5.5 06/12/2021   Last thyroid functions Lab Results  Component Value Date   TSH 1.600 06/12/2021   T3TOTAL 88 02/02/2019   Last vitamin D Lab Results  Component Value Date   VD25OH 55.2 06/12/2021   Last vitamin B12 and Folate Lab Results  Component Value Date   VITAMINB12 833 03/29/2020      The 10-year ASCVD risk score (Arnett DK, et al., 2019) is: 5.4%    Assessment & Plan:   1. Anxiety/Other insomnia: Doing well with Lexapro 5 mg, will continue. Refilled today. Discontinue Hydroxyzine, restart Trazodone 50 mg PRN for sleep. Discussed signs and symptoms or serotonin syndrome, information printed for the patient to monitor. Follow up in 3 months.   - escitalopram (LEXAPRO) 5 MG tablet; Take 1 tablet (5 mg total) by mouth daily.  Dispense: 90 tablet; Refill: 1 - traZODone (DESYREL) 50 MG tablet; Take 1 tablet (50 mg total) by mouth at bedtime as needed for sleep. Can take 50-100 mg as needed at night for insomnia.  Dispense: 90 tablet; Refill: 1  2. Mixed hyperlipidemia: Restart Crestor now that she is off cancer medications. Monitor for side effects. Plan to recheck labs at follow up.    No follow-ups on file.    Margarita Mail, DO

## 2022-06-24 ENCOUNTER — Ambulatory Visit: Payer: 59 | Admitting: Internal Medicine

## 2022-06-24 ENCOUNTER — Encounter: Payer: Self-pay | Admitting: Internal Medicine

## 2022-06-24 VITALS — BP 124/68 | HR 86 | Temp 98.9°F | Resp 18 | Ht 63.0 in | Wt 177.9 lb

## 2022-06-24 DIAGNOSIS — R635 Abnormal weight gain: Secondary | ICD-10-CM

## 2022-06-24 DIAGNOSIS — Z114 Encounter for screening for human immunodeficiency virus [HIV]: Secondary | ICD-10-CM

## 2022-06-24 DIAGNOSIS — F419 Anxiety disorder, unspecified: Secondary | ICD-10-CM

## 2022-06-24 DIAGNOSIS — Z1159 Encounter for screening for other viral diseases: Secondary | ICD-10-CM

## 2022-06-24 DIAGNOSIS — E785 Hyperlipidemia, unspecified: Secondary | ICD-10-CM | POA: Diagnosis not present

## 2022-06-24 DIAGNOSIS — G4709 Other insomnia: Secondary | ICD-10-CM

## 2022-06-25 LAB — CBC WITH DIFFERENTIAL/PLATELET
Absolute Monocytes: 380 cells/uL (ref 200–950)
Basophils Absolute: 42 cells/uL (ref 0–200)
Basophils Relative: 1.1 %
Eosinophils Absolute: 122 cells/uL (ref 15–500)
Eosinophils Relative: 3.2 %
HCT: 43.7 % (ref 35.0–45.0)
Hemoglobin: 14.6 g/dL (ref 11.7–15.5)
Lymphs Abs: 1151 cells/uL (ref 850–3900)
MCH: 30.7 pg (ref 27.0–33.0)
MCHC: 33.4 g/dL (ref 32.0–36.0)
MCV: 91.8 fL (ref 80.0–100.0)
MPV: 10.2 fL (ref 7.5–12.5)
Monocytes Relative: 10 %
Neutro Abs: 2105 cells/uL (ref 1500–7800)
Neutrophils Relative %: 55.4 %
Platelets: 166 10*3/uL (ref 140–400)
RBC: 4.76 10*6/uL (ref 3.80–5.10)
RDW: 12.5 % (ref 11.0–15.0)
Total Lymphocyte: 30.3 %
WBC: 3.8 10*3/uL (ref 3.8–10.8)

## 2022-06-25 LAB — COMPLETE METABOLIC PANEL WITH GFR
AG Ratio: 1.9 (calc) (ref 1.0–2.5)
ALT: 17 U/L (ref 6–29)
AST: 18 U/L (ref 10–35)
Albumin: 4.3 g/dL (ref 3.6–5.1)
Alkaline phosphatase (APISO): 59 U/L (ref 37–153)
BUN: 17 mg/dL (ref 7–25)
CO2: 22 mmol/L (ref 20–32)
Calcium: 9.6 mg/dL (ref 8.6–10.4)
Chloride: 108 mmol/L (ref 98–110)
Creat: 0.73 mg/dL (ref 0.50–1.05)
Globulin: 2.3 g/dL (calc) (ref 1.9–3.7)
Glucose, Bld: 96 mg/dL (ref 65–99)
Potassium: 4.2 mmol/L (ref 3.5–5.3)
Sodium: 142 mmol/L (ref 135–146)
Total Bilirubin: 0.3 mg/dL (ref 0.2–1.2)
Total Protein: 6.6 g/dL (ref 6.1–8.1)
eGFR: 92 mL/min/{1.73_m2} (ref >=60)

## 2022-06-25 LAB — LIPID PANEL
Cholesterol: 164 mg/dL (ref <200)
HDL: 62 mg/dL (ref >=50)
LDL Cholesterol (Calc): 78 mg/dL (calc)
Non-HDL Cholesterol (Calc): 102 mg/dL (calc) (ref <130)
Total CHOL/HDL Ratio: 2.6 (calc) (ref <5.0)
Triglycerides: 138 mg/dL (ref <150)

## 2022-06-25 LAB — HIV ANTIBODY (ROUTINE TESTING W REFLEX): HIV 1&2 Ab, 4th Generation: NONREACTIVE

## 2022-06-25 LAB — HEPATITIS C ANTIBODY: Hepatitis C Ab: NONREACTIVE

## 2022-06-30 ENCOUNTER — Other Ambulatory Visit: Payer: Self-pay | Admitting: Internal Medicine

## 2022-06-30 DIAGNOSIS — E785 Hyperlipidemia, unspecified: Secondary | ICD-10-CM

## 2022-07-02 NOTE — Telephone Encounter (Signed)
Refilled 04/07/22 # 90 with 1 refill. Requested Prescriptions  Refused Prescriptions Disp Refills   rosuvastatin (CRESTOR) 20 MG tablet [Pharmacy Med Name: Rosuvastatin Calcium 20 MG Oral Tablet] 120 tablet 2    Sig: TAKE 1 TABLET BY MOUTH DAILY     Cardiovascular:  Antilipid - Statins 2 Failed - 06/30/2022 10:56 PM      Failed - Lipid Panel in normal range within the last 12 months    Cholesterol, Total  Date Value Ref Range Status  06/12/2021 186 100 - 199 mg/dL Final   Cholesterol  Date Value Ref Range Status  06/24/2022 164 <200 mg/dL Final   LDL Cholesterol (Calc)  Date Value Ref Range Status  06/24/2022 78 mg/dL (calc) Final    Comment:    Reference range: <100 . Desirable range <100 mg/dL for primary prevention;   <70 mg/dL for patients with CHD or diabetic patients  with > or = 2 CHD risk factors. Marland Kitchen LDL-C is now calculated using the Martin-Hopkins  calculation, which is a validated novel method providing  better accuracy than the Friedewald equation in the  estimation of LDL-C.  Horald Pollen et al. Lenox Ahr. 4098;119(14): 2061-2068  (http://education.[facility].com/faq/FAQ164)    HDL  Date Value Ref Range Status  06/24/2022 62 > OR = 50 mg/dL Final  78/29/5621 77 >30 mg/dL Final   Triglycerides  Date Value Ref Range Status  06/24/2022 138 <150 mg/dL Final         Passed - Cr in normal range and within 360 days    Creat  Date Value Ref Range Status  06/24/2022 0.73 0.50 - 1.05 mg/dL Final         Passed - Patient is not pregnant      Passed - Valid encounter within last 12 months    Recent Outpatient Visits           1 week ago Anxiety   [facility] Margarita Mail, DO   3 months ago Anxiety   [facility] Margarita Mail, DO   4 months ago Anxiety   [facility] Margarita Mail, DO   8 years ago Acute cystitis without hematuria   Primary Care at [facility],  Gwenlyn Found, MD   9 years ago Acute pyelonephritis   [facility], Antonietta Jewel, PA-C       Future Appointments             In 5 months Margarita Mail, DO [facility], [facility]

## 2022-07-09 ENCOUNTER — Other Ambulatory Visit: Payer: Self-pay | Admitting: Hematology

## 2022-07-15 ENCOUNTER — Ambulatory Visit
Admission: RE | Admit: 2022-07-15 | Discharge: 2022-07-15 | Disposition: A | Discharge status: Home / Self Care | Payer: 59 | Source: Ambulatory Visit | Attending: Hematology | Admitting: Hematology

## 2022-07-15 DIAGNOSIS — Z170 Estrogen receptor positive status [ER+]: Secondary | ICD-10-CM

## 2022-07-15 DIAGNOSIS — C50411 Malignant neoplasm of upper-outer quadrant of right female breast: Secondary | ICD-10-CM

## 2022-08-26 ENCOUNTER — Other Ambulatory Visit: Payer: Self-pay | Admitting: Internal Medicine

## 2022-08-26 DIAGNOSIS — E785 Hyperlipidemia, unspecified: Secondary | ICD-10-CM

## 2022-08-28 NOTE — Telephone Encounter (Signed)
Requested Prescriptions  Pending Prescriptions Disp Refills   rosuvastatin (CRESTOR) 20 MG tablet [Pharmacy Med Name: Rosuvastatin Calcium 20 MG Oral Tablet] 90 tablet 1    Sig: TAKE 1 TABLET BY MOUTH DAILY     Cardiovascular:  Antilipid - Statins 2 Failed - 08/26/2022 10:43 PM      Failed - Lipid Panel in normal range within the last 12 months    Cholesterol, Total  Date Value Ref Range Status  06/12/2021 186 100 - 199 mg/dL Final   Cholesterol  Date Value Ref Range Status  06/24/2022 164 <200 mg/dL Final   LDL Cholesterol (Calc)  Date Value Ref Range Status  06/24/2022 78 mg/dL (calc) Final    Comment:    Reference range: <100 . Desirable range <100 mg/dL for primary prevention;   <70 mg/dL for patients with CHD or diabetic patients  with > or = 2 CHD risk factors. Marland Kitchen LDL-C is now calculated using the Martin-Hopkins  calculation, which is a validated novel method providing  better accuracy than the Friedewald equation in the  estimation of LDL-C.  Horald Pollen et al. Lenox Ahr. 7829;562(13): 2061-2068  (http://education.[facility].com/faq/FAQ164)    HDL  Date Value Ref Range Status  06/24/2022 62 > OR = 50 mg/dL Final  08/65/7846 77 >96 mg/dL Final   Triglycerides  Date Value Ref Range Status  06/24/2022 138 <150 mg/dL Final         Passed - Cr in normal range and within 360 days    Creat  Date Value Ref Range Status  06/24/2022 0.73 0.50 - 1.05 mg/dL Final         Passed - Patient is not pregnant      Passed - Valid encounter within last 12 months    Recent Outpatient Visits           2 months ago Anxiety   [facility] Margarita Mail, DO   5 months ago Anxiety   [facility] Margarita Mail, DO   6 months ago Anxiety   [facility] Margarita Mail, DO   8 years ago Acute cystitis without hematuria   Primary Care at [facility], Gwenlyn Found, MD   9 years ago Acute  pyelonephritis   [facility], Antonietta Jewel, PA-C       Future Appointments             In 3 months Margarita Mail, DO [facility] : [facility], [facility]

## 2022-10-25 LAB — HM DEXA SCAN

## 2022-10-27 ENCOUNTER — Encounter: Payer: Self-pay | Admitting: Nurse Practitioner

## 2022-10-28 ENCOUNTER — Other Ambulatory Visit: Payer: Self-pay | Admitting: Internal Medicine

## 2022-10-28 DIAGNOSIS — F419 Anxiety disorder, unspecified: Secondary | ICD-10-CM

## 2022-10-29 NOTE — Telephone Encounter (Signed)
Requested Prescriptions  Pending Prescriptions Disp Refills   escitalopram (LEXAPRO) 5 MG tablet [Pharmacy Med Name: Escitalopram Oxalate 5 MG Oral Tablet] 90 tablet 3    Sig: TAKE 1 TABLET BY MOUTH DAILY     Psychiatry:  Antidepressants - SSRI Passed - 10/28/2022 10:58 PM      Passed - Valid encounter within last 6 months    Recent Outpatient Visits           4 months ago Anxiety   [facility] Margarita Mail, DO   7 months ago Anxiety   [facility] Margarita Mail, DO   8 months ago Anxiety   [facility] Margarita Mail, DO   8 years ago Acute cystitis without hematuria   Primary Care at [facility], Gwenlyn Found, MD   10 years ago Acute pyelonephritis   [facility], Antonietta Jewel, PA-C       Future Appointments             In 1 month Margarita Mail, DO [facility], [facility]

## 2022-12-02 ENCOUNTER — Encounter: Payer: Self-pay | Admitting: Nurse Practitioner

## 2022-12-02 ENCOUNTER — Inpatient Hospital Stay: Payer: 59 | Attending: Hematology

## 2022-12-02 ENCOUNTER — Inpatient Hospital Stay: Payer: 59 | Admitting: Nurse Practitioner

## 2022-12-02 VITALS — BP 138/83 | HR 63 | Temp 97.9°F | Resp 14 | Ht 63.0 in | Wt 177.2 lb

## 2022-12-02 DIAGNOSIS — Z170 Estrogen receptor positive status [ER+]: Secondary | ICD-10-CM

## 2022-12-02 DIAGNOSIS — C50411 Malignant neoplasm of upper-outer quadrant of right female breast: Secondary | ICD-10-CM

## 2022-12-02 DIAGNOSIS — M8588 Other specified disorders of bone density and structure, other site: Secondary | ICD-10-CM | POA: Diagnosis not present

## 2022-12-02 DIAGNOSIS — Z923 Personal history of irradiation: Secondary | ICD-10-CM | POA: Insufficient documentation

## 2022-12-02 DIAGNOSIS — Z79811 Long term (current) use of aromatase inhibitors: Secondary | ICD-10-CM | POA: Insufficient documentation

## 2022-12-02 LAB — CBC WITH DIFFERENTIAL (CANCER CENTER ONLY)
Abs Immature Granulocytes: 0.01 10*3/uL (ref 0.00–0.07)
Basophils Absolute: 0 10*3/uL (ref 0.0–0.1)
Basophils Relative: 1 %
Eosinophils Absolute: 0.1 10*3/uL (ref 0.0–0.5)
Eosinophils Relative: 2 %
HCT: 44.1 % (ref 36.0–46.0)
Hemoglobin: 14.9 g/dL (ref 12.0–15.0)
Immature Granulocytes: 0 %
Lymphocytes Relative: 33 %
Lymphs Abs: 1.4 10*3/uL (ref 0.7–4.0)
MCH: 30.7 pg (ref 26.0–34.0)
MCHC: 33.8 g/dL (ref 30.0–36.0)
MCV: 90.9 fL (ref 80.0–100.0)
Monocytes Absolute: 0.5 10*3/uL (ref 0.1–1.0)
Monocytes Relative: 11 %
Neutro Abs: 2.3 10*3/uL (ref 1.7–7.7)
Neutrophils Relative %: 53 %
Platelet Count: 172 10*3/uL (ref 150–400)
RBC: 4.85 MIL/uL (ref 3.87–5.11)
RDW: 12.1 % (ref 11.5–15.5)
WBC Count: 4.3 10*3/uL (ref 4.0–10.5)
nRBC: 0 % (ref 0.0–0.2)

## 2022-12-02 LAB — CMP (CANCER CENTER ONLY)
ALT: 22 U/L (ref 0–44)
AST: 16 U/L (ref 15–41)
Albumin: 4.3 g/dL (ref 3.5–5.0)
Alkaline Phosphatase: 49 U/L (ref 38–126)
Anion gap: 5 (ref 5–15)
BUN: 13 mg/dL (ref 8–23)
CO2: 29 mmol/L (ref 22–32)
Calcium: 9.6 mg/dL (ref 8.9–10.3)
Chloride: 107 mmol/L (ref 98–111)
Creatinine: 0.78 mg/dL (ref 0.44–1.00)
GFR, Estimated: >60 mL/min (ref >60)
Glucose, Bld: 105 mg/dL — ABNORMAL HIGH (ref 70–99)
Potassium: 4.2 mmol/L (ref 3.5–5.1)
Sodium: 141 mmol/L (ref 135–145)
Total Bilirubin: 0.4 mg/dL (ref <1.2)
Total Protein: 6.6 g/dL (ref 6.5–8.1)

## 2022-12-02 NOTE — Progress Notes (Signed)
Patient Care Team: Margarita Mail, DO as PCP - General (Internal Medicine) Bensimhon, Bevelyn Buckles, MD as PCP - Advanced Heart Failure (Cardiology) Kathryne Hitch, MD as Consulting Physician (Orthopedic Surgery) Richarda Overlie, MD as Consulting Physician (Obstetrics and Gynecology) Harriette Bouillon, MD as Consulting Physician (General Surgery) Malachy Mood, MD as Consulting Physician (Hematology) Dorothy Puffer, MD as Consulting Physician (Radiation Oncology) Pershing Proud, RN as Oncology Nurse Navigator Donnelly Angelica, RN as Oncology Nurse Navigator Pollyann Samples, NP as Nurse Practitioner (Nurse Practitioner)   CHIEF COMPLAINT: Follow-up right breast cancer  Oncology History Overview Note  Cancer Staging Malignant neoplasm of upper-outer quadrant of right breast in female, estrogen receptor positive [facility]) Staging form: Breast, AJCC 8th Edition - Clinical stage from 12/29/2018: Stage IA (cT1b, cN0, cM0, G2, ER+, PR+, HER2+) - Unsigned - Pathologic stage from 01/27/2019: Stage IA (pT1c, pN0, cM0, G3, ER+, PR+, HER2+) - Signed by Malachy Mood, MD on 02/06/2019 Cancer Staging No matching staging information was found for the patient.    Malignant neoplasm of upper-outer quadrant of right breast in female, estrogen receptor positive (HCC)  12/22/2018 Mammogram    Diagnostic Mammogram 12/22/18  IMPRESSION: 1. 9 mm mass, with associated calcifications and architectural distortion, in the 11 o'clock position of the right breast, highly suspicious for breast carcinoma. 2. Questionable mass noted in the inferior aspect of the right breast remains equivocal on diagnostic imaging may reflect focal fibroglandular tissue. There is no sonographic correlate.   12/23/2018 Initial Biopsy   Diagnosis 12/23/18 Breast, right, needle core biopsy, 11 o'clock position - INVASIVE DUCTAL CARCINOMA, SEE COMMENT. - DUCTAL CARCINOMA IN SITU WITH NECROSIS.   12/28/2018 Initial Diagnosis   Malignant  neoplasm of upper-outer quadrant of right breast in female, estrogen receptor positive (HCC)   01/13/2019 Imaging   MRI Breast 01/13/19  IMPRESSION: 1. Biopsy proven malignancy in the upper-outer right breast. No additional suspicious findings on the right. 2. No MRI evidence of malignancy on the left. 3. No suspicious lymphadenopathy.   01/27/2019 Surgery   RIGHT BREAST LUMPECTOMY WITH RADIOACTIVE SEED AND SENTINEL LYMPH NODE MAPPING and PAC placement by Dr Luisa Hart  01/27/19   01/27/2019 Pathology Results   FINAL MICROSCOPIC DIAGNOSIS: 01/27/19  A. BREAST, RIGHT, LUMPECTOMY:  - Invasive ductal carcinoma, grade 3, spanning 1.3 cm.  - High grade ductal carcinoma in situ with necrosis.  - Biopsy site.  - Final resection margins are negative for carcinoma.  - See oncology table.   B. BREAST, RIGHT ADDITIONAL SUPERIOR MARGIN, EXCISION:  - Fibrocystic change and adenosis.  - No malignancy identified.   C. LYMPH NODE, RIGHT #1, SENTINEL, BIOPSY:  - One of one lymph nodes negative for carcinoma (0/1).   D. LYMPH NODE, RIGHT, SENTINEL, BIOPSY:  - One of one lymph nodes negative for carcinoma (0/1).   E. LYMPH NODE, RIGHT, SENTINEL, BIOPSY:  - One of one lymph nodes negative for carcinoma (0/1).   F. LYMPH NODE, RIGHT, SENTINEL, BIOPSY:  - One of one lymph nodes negative for carcinoma (0/1).       01/27/2019 Cancer Staging   Staging form: Breast, AJCC 8th Edition - Pathologic stage from 01/27/2019: Stage IA (pT1c, pN0, cM0, G3, ER+, PR+, HER2+) - Signed by Malachy Mood, MD on 02/06/2019   02/08/2019 Imaging   CT AP W Contrast 02/08/19  IMPRESSION: 1. Wall thickening of the gastric antrum may represent gastritis.   Aortic Atherosclerosis (ICD10-I70.0).   02/23/2019 - 01/25/2020 Chemotherapy   Adjuvant weekly  Abraxane with trastuzumab-anns (Kanjinti) q3weeks starting 02/23/19. Completed 12 week of Abraxane on 05/11/19. Maintenance trastuzumab-anns (Kanjinti) q3weeks start 05/18/19. Plan to complete  01/25/20.   06/13/2019 - 08/01/2019 Radiation Therapy   Radiation with Dr Mitzi Hansen starting 06/13/19-08/01/19   08/2019 -  Anti-estrogen oral therapy   Letrozole, beginning 08/25/2019    09/28/2019 Survivorship   SCP delivered by Santiago Glad, NP       CURRENT THERAPY: Adjuvant anti-estrogen therapy since 08/2019, currently on tamoxifen since 02/2022  INTERVAL HISTORY Ms. Obara returns for follow-up as scheduled, last seen by Dr. Mosetta Putt 06/02/2022.  Doing well in the interim without significant changes in her health.  Tolerating tamoxifen with less joint pain, leg still ache but in different places and not as bad.  Magnesium helped cramps but she had 1 last night.  Denies concerns in her breast such as new lump/mass, nipple discharge or inversion, or skin change.  ROS  All other systems reviewed and negative  Past Medical History:  Diagnosis Date   Acid reflux    occasionally, and takes pepcid OTC   Anxiety    Breast cancer (HCC)    01-2019   Cancer (HCC)    breast cancer   Depression    Recent   Gastric ulcer    GERD (gastroesophageal reflux disease)    Hyperlipidemia    Joint pain    Kidney stones    Lower extremity edema    Neuromuscular disorder (HCC)    Recent muscle pain   Personal history of chemotherapy    Personal history of radiation therapy    04-2019   Stomach ulcer    Vitamin D deficiency      Past Surgical History:  Procedure Laterality Date   AUGMENTATION MAMMAPLASTY     BREAST IMPLANT EXCHANGE Bilateral 11/15/2020   Procedure: BREAST IMPLANT EXCHANGE;  Surgeon: Peggye Form, DO;  Location: [facility];  Service: Plastics;  Laterality: Bilateral;   BREAST LUMPECTOMY     BREAST LUMPECTOMY WITH RADIOACTIVE SEED AND SENTINEL LYMPH NODE BIOPSY Right 01/27/2019   Procedure: RIGHT BREAST LUMPECTOMY WITH RADIOACTIVE SEED AND SENTINEL LYMPH NODE MAPPING;  Surgeon: Harriette Bouillon, MD;  Location: [facility];  Service: General;  Laterality:  Right;   COSMETIC SURGERY Bilateral 2000   breast aug/ lipo   LIPOSUCTION Bilateral 11/15/2020   Procedure: LIPOSUCTION;  Surgeon: Peggye Form, DO;  Location: [facility];  Service: Plastics;  Laterality: Bilateral;   MASTOPEXY Bilateral 11/15/2020   Procedure: MASTOPEXY;  Surgeon: Peggye Form, DO;  Location: [facility];  Service: Plastics;  Laterality: Bilateral;   PORT-A-CATH REMOVAL Right 04/05/2020   Procedure: REMOVAL PORT-A-CATH;  Surgeon: Harriette Bouillon, MD;  Location: [facility];  Service: General;  Laterality: Right;   PORTACATH PLACEMENT Right 01/27/2019   Procedure: INSERTION PORT-A-CATH WITH ULTRASOUND;  Surgeon: Harriette Bouillon, MD;  Location: [facility];  Service: General;  Laterality: Right;     Outpatient Encounter Medications as of 12/02/2022  Medication Sig   CALCIUM PO Take 1,000 mg by mouth daily.   escitalopram (LEXAPRO) 5 MG tablet TAKE 1 TABLET BY MOUTH DAILY   estradiol (ESTRACE VAGINAL) 0.1 MG/GM vaginal cream Use a fingertip size amount of cream on your finger and place slightly past the vaginal entrance 3 times a week at bedtime. Do not use the applicator if this comes with the cream.   MAGNESIUM MALATE PO Take 400 mg by mouth daily.  omeprazole (PRILOSEC OTC) 20 MG tablet Take 20 mg by mouth daily. As needed   rosuvastatin (CRESTOR) 20 MG tablet TAKE 1 TABLET BY MOUTH DAILY   tamoxifen (NOLVADEX) 20 MG tablet TAKE 1 TABLET BY MOUTH DAILY   traZODone (DESYREL) 50 MG tablet Take 1 tablet (50 mg total) by mouth at bedtime as needed for sleep. Can take 50-100 mg as needed at night for insomnia.   No facility-administered encounter medications on file as of 12/02/2022.     Today's Vitals   12/02/22 1121 12/02/22 1130  BP: 138/83   Pulse: 63   Resp: 14   Temp: 97.9 F (36.6 C)   TempSrc: Temporal   SpO2: 99%   Weight: 177 lb 3.2 oz (80.4 kg)   Height: 5\' 3"  (1.6 m)   PainSc:   0-No pain   Body mass index is 31.39 kg/m.   PHYSICAL EXAM GENERAL:alert, no distress and comfortable SKIN: no rash  EYES: sclera clear NECK: without mass LYMPH:  no palpable cervical or supraclavicular lymphadenopathy  LUNGS: clear with normal breathing effort HEART: regular rate & rhythm, no lower extremity edema ABDOMEN: abdomen soft, non-tender and normal bowel sounds NEURO: alert & oriented x 3 with fluent speech, no focal motor/sensory deficits Breast exam: S/p right lumpectomy and bilateral reconstruction, incisions completely healed.  No palpable mass or nodularity in either breast or axilla that I could appreciate.    CBC    Component Value Date/Time   WBC 4.3 12/02/2022 1052   WBC 3.8 06/24/2022 1121   RBC 4.85 12/02/2022 1052   HGB 14.9 12/02/2022 1052   HGB 15.3 02/21/2020 0901   HCT 44.1 12/02/2022 1052   HCT 45.7 03/29/2020 1212   PLT 172 12/02/2022 1052   PLT 191 02/21/2020 0901   MCV 90.9 12/02/2022 1052   MCV 89 02/21/2020 0901   MCH 30.7 12/02/2022 1052   MCHC 33.8 12/02/2022 1052   RDW 12.1 12/02/2022 1052   RDW 12.6 02/21/2020 0901   LYMPHSABS 1.4 12/02/2022 1052   LYMPHSABS 1.7 02/02/2019 1019   MONOABS 0.5 12/02/2022 1052   EOSABS 0.1 12/02/2022 1052   EOSABS 0.1 02/02/2019 1019   BASOSABS 0.0 12/02/2022 1052   BASOSABS 0.0 02/02/2019 1019     CMP     Component Value Date/Time   NA 141 12/02/2022 1052   NA 141 06/12/2021 1018   K 4.2 12/02/2022 1052   CL 107 12/02/2022 1052   CO2 29 12/02/2022 1052   GLUCOSE 105 (H) 12/02/2022 1052   BUN 13 12/02/2022 1052   BUN 13 06/12/2021 1018   CREATININE 0.78 12/02/2022 1052   CREATININE 0.73 06/24/2022 1121   CALCIUM 9.6 12/02/2022 1052   PROT 6.6 12/02/2022 1052   PROT 6.6 06/12/2021 1018   ALBUMIN 4.3 12/02/2022 1052   ALBUMIN 4.4 06/12/2021 1018   AST 16 12/02/2022 1052   ALT 22 12/02/2022 1052   ALKPHOS 49 12/02/2022 1052   BILITOT 0.4 12/02/2022 1052   GFRNONAA >60 12/02/2022 1052    GFRAA 78 02/21/2020 0901   GFRAA >60 10/12/2019 1130     ASSESSMENT & PLAN: 62 year old female  Malignant neoplasm of upper-outer quadrant of right breast, invasive ductal carcinoma, pT1cN0M0, stage IA, Triple Positive, Grade 3 -Diagnosed in 12/2018 s/p right lumpectomy and adjuvant radiation.  -She completed adjuvant chemotherapy with Weekly Abraxane and trastuzumab-anns (Kanjinti)  02/23/19-05/11/19 and maintenance Kanjinti (01/25/20). -She started antiestrogen therapy in 08/2019, did not tolerate letrozole or Aromasin and switched to tamoxifen in  02/2022, tolerating much better -Last mammogram 07/15/22 was benign, due for screening MRI 12/2022  -Ms. Harewood is clinically doing well.  Tolerating tamoxifen, exam is benign, labs are normal.  Overall no clinical concern for recurrence  -Continue surveillance/high risk screening and tamoxifen -F/up 6 months   Osteopenia  -Her 07/2019 DEXA shows osteopenia with lowest T-score -1.3 at right hip.  With FRAX score of 7.5% risk of major osteoporotic fracture and 0.6% risk of hip fracture in the next 10 years -She has been taking joint calcium/vitamin D tablet, not exercising as much -She understands the risk AI can reduce BMD  -repeat DEXA 06/27/2021 showed slightly worsened osteopenia at the spine, T score -1.2 at the left hip, and T score -2.2 at the forearm radius which was not measured previously.  FRAX score unchanged, low -Previously discussed various medications including oral Fosamax, Xgeva/Prolia injections, and Zometa infusion (which has the potential benefit of reducing risk of bone metastasis and breast cancer patients).  She declined -Changed to tamoxifen 02/2022   Genetics:  -myRisk genetic result 06/10/2017 showed no pathogenic mutations, with a 30.5% remaining lifetime risk of breast cancer, this is high risk -Close screening annual mammogram and MRI staggered 6 months apart   PLAN: -Labs reviewed -Continue breast cancer surveillance and  Tamoxifen -MRI 12/2022, next mammo 06/2023 -F/up in 6 months, or sooner if needed   Orders Placed This Encounter  Procedures   MR BREAST BILATERAL W WO CONTRAST INC CAD    Standing Status:   Future    Standing Expiration Date:   12/02/2023    Order Specific Question:   If indicated for the ordered procedure, I authorize the administration of contrast media per Radiology protocol    Answer:   Yes    Order Specific Question:   What is the patient's sedation requirement?    Answer:   No Sedation    Order Specific Question:   Does the patient have a pacemaker or implanted devices?    Answer:   No    Order Specific Question:   Preferred imaging location?    Answer:   [facility] (table limit-550lbs)      All questions were answered. The patient knows to call the clinic with any problems, questions or concerns. No barriers to learning were detected.  Santiago Glad, NP-C 12/02/2022

## 2022-12-06 ENCOUNTER — Other Ambulatory Visit: Payer: Self-pay | Admitting: Internal Medicine

## 2022-12-06 DIAGNOSIS — E785 Hyperlipidemia, unspecified: Secondary | ICD-10-CM

## 2022-12-08 NOTE — Telephone Encounter (Signed)
Requested Prescriptions  Refused Prescriptions Disp Refills   rosuvastatin (CRESTOR) 20 MG tablet [Pharmacy Med Name: Rosuvastatin Calcium 20 MG Oral Tablet] 120 tablet 2    Sig: TAKE 1 TABLET BY MOUTH DAILY     Cardiovascular:  Antilipid - Statins 2 Failed - 12/06/2022 10:06 PM      Failed - Lipid Panel in normal range within the last 12 months    Cholesterol, Total  Date Value Ref Range Status  06/12/2021 186 100 - 199 mg/dL Final   Cholesterol  Date Value Ref Range Status  06/24/2022 164 <200 mg/dL Final   LDL Cholesterol (Calc)  Date Value Ref Range Status  06/24/2022 78 mg/dL (calc) Final    Comment:    Reference range: <100 . Desirable range <100 mg/dL for primary prevention;   <70 mg/dL for patients with CHD or diabetic patients  with > or = 2 CHD risk factors. Marland Kitchen LDL-C is now calculated using the Martin-Hopkins  calculation, which is a validated novel method providing  better accuracy than the Friedewald equation in the  estimation of LDL-C.  Horald Pollen et al. Lenox Ahr. 8416;606(30): 2061-2068  (http://education.[facility].com/faq/FAQ164)    HDL  Date Value Ref Range Status  06/24/2022 62 > OR = 50 mg/dL Final  16/01/930 77 >35 mg/dL Final   Triglycerides  Date Value Ref Range Status  06/24/2022 138 <150 mg/dL Final         Passed - Cr in normal range and within 360 days    Creatinine  Date Value Ref Range Status  12/02/2022 0.78 0.44 - 1.00 mg/dL Final   Creat  Date Value Ref Range Status  06/24/2022 0.73 0.50 - 1.05 mg/dL Final         Passed - Patient is not pregnant      Passed - Valid encounter within last 12 months    Recent Outpatient Visits           5 months ago Anxiety   [facility] Margarita Mail, DO   8 months ago Anxiety   [facility] Margarita Mail, DO   10 months ago Anxiety   [facility] Margarita Mail, DO   9 years ago Acute cystitis  without hematuria   [facility], Gwenlyn Found, MD   10 years ago Acute pyelonephritis   [facility], Antonietta Jewel, PA-C       Future Appointments             In 2 weeks Margarita Mail, DO [place] [facility], [facility]

## 2022-12-23 NOTE — Progress Notes (Unsigned)
Established Patient Office Visit  Subjective   Patient ID: Alicia Hahn, female    DOB: 04-Jan-1961  Age: 62 y.o. MRN: [id number]  No chief complaint on file.   HPI  Patient is here to follow up on chronic medical conditions. Talked about weight loss options, she [facility] and had been on a 1000 calorie a day diet, lost 30 pounds in 4 months but has gained it back. Had also been on Phentermine at one point as well. Patient is planning on having right carpal tunnel.   HLD: -Medications: Crestor 20 mg  -Last lipid panel: Lipid Panel - was on statin at the time    Component Value Date/Time   CHOL 164 06/24/2022 1121   CHOL 186 06/12/2021 1018   TRIG 138 06/24/2022 1121   HDL 62 06/24/2022 1121   HDL 77 06/12/2021 1018   CHOLHDL 2.6 06/24/2022 1121   LDLCALC 78 06/24/2022 1121   LABVLDL 22 06/12/2021 1018   The 10-year ASCVD risk score (Arnett DK, et al., 2019) is: 4.9%   Values used to calculate the score:     Age: 2 years     Sex: Female     Is Non-Hispanic African American: No     Diabetic: No     Tobacco smoker: No     Systolic Blood Pressure: 138 mmHg     Is BP treated: No     HDL Cholesterol: 62 mg/dL     Total Cholesterol: 164 mg/dL  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:  -Following with Oncology, note reviewed from 02/11/22 -First diagnosed in 2020 s/p lumpectomy and adjuvant radiation/chemotherapy   -Medication switched to Aromasin 25 mg  -Had been on Letrozole but was having some muscle/joint pains, this was discontinued today  GERD: -Takes Prilosec 20 mg PRN  Osteopenia: -DEXA 6/23 showing t score left forearm -2.2 -On calcium and Vitamin D supplements, vitamin D level from 5/23 normal   Anxiety: -Duration:months  -Anxious mood: yes  -Excessive worrying: yes -Irritability: yes  -Sweating: yes -Panic attacks: yes -Had been taking Effexor for hot flashes but found no benefit and stopped it.  -Has  been on Lexapro 5 mg and doing well, had also been taking Trazodone 50 mg PRN for sleep but she hasn't had to take it in the last month or so.     06/24/2022   10:57 AM 03/24/2022   12:58 PM 02/11/2022    1:10 PM 06/12/2021    9:48 AM 07/04/2020    2:42 PM  Depression screen PHQ 2/9  Decreased Interest 0 0 1 0 0  Down, Depressed, Hopeless 0 0 1 0 0  PHQ - 2 Score 0 0 2 0 0  Altered sleeping 0 0 1 0 1  Tired, decreased energy 0 0 1 0 0  Change in appetite 0 0 0 0 0  Feeling bad or failure about yourself  0 0 0 0 0  Trouble concentrating 0 0 0 0 0  Moving slowly or fidgety/restless 0 0 0 0 0  Suicidal thoughts 0 0 0 0 0  PHQ-9 Score 0 0 4 0 1  Difficult doing work/chores Not difficult at all  Somewhat difficult Not difficult at all    Health Maintenance: -Blood work UTD -Mammogram MRI 12/23 Birads-2  -Colonoscopy 5/21, repeat in 10 years -Pap 5/22 negative   Patient Active Problem List   Diagnosis Date Noted   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  breast (HCC) 06/12/2021   Osteopenia 06/12/2021   Pain of left hand 11/05/2020   History of breast cancer 08/08/2020   Acquired absence of breast 08/08/2020   Class 1 obesity with serious comorbidity and body mass index (BMI) of 30.0 to 30.9 in adult 05/02/2020   Effects of radiation 03/23/2020   Postoperative breast asymmetry 03/23/2020   Elevated liver enzymes 04/25/2019   Hyperlipidemia 04/25/2019   Chemoprophylaxis 04/25/2019   Port-A-Cath in place 03/30/2019   Cervical radiculopathy at C7 on L  02/02/2019   Psychophysiological insomnia 02/02/2019   Numbness and tingling of hand-  C7 distribution L hand 02/02/2019   Stress and adjustment reaction 02/02/2019   Genetic testing 12/30/2018   Malignant neoplasm of upper-outer quadrant of right breast in female, estrogen receptor positive (HCC) 12/28/2018   Malignant tumor of breast (HCC) 12/22/2018   Family history of breast cancer in sister(age33) and mother 11/02/2018   Plantar  fasciitis of left foot 11/02/2018   Muscle ache of extremity-bilateral calf muscles 11/02/2018   Impingement syndrome of right shoulder 05/11/2017   Past Medical History:  Diagnosis Date   Acid reflux    occasionally, and takes pepcid OTC   Anxiety    Breast cancer (HCC)    01-2019   Cancer (HCC)    breast cancer   Depression    Recent   Gastric ulcer    GERD (gastroesophageal reflux disease)    Hyperlipidemia    Joint pain    Kidney stones    Lower extremity edema    Neuromuscular disorder (HCC)    Recent muscle pain   Personal history of chemotherapy    Personal history of radiation therapy    04-2019   Stomach ulcer    Vitamin D deficiency    Past Surgical History:  Procedure Laterality Date   AUGMENTATION MAMMAPLASTY     BREAST IMPLANT EXCHANGE Bilateral 11/15/2020   Procedure: BREAST IMPLANT EXCHANGE;  Surgeon: Peggye Form, DO;  Location: [facility];  Service: Plastics;  Laterality: Bilateral;   BREAST LUMPECTOMY     BREAST LUMPECTOMY WITH RADIOACTIVE SEED AND SENTINEL LYMPH NODE BIOPSY Right 01/27/2019   Procedure: RIGHT BREAST LUMPECTOMY WITH RADIOACTIVE SEED AND SENTINEL LYMPH NODE MAPPING;  Surgeon: Harriette Bouillon, MD;  Location: [facility];  Service: General;  Laterality: Right;   COSMETIC SURGERY Bilateral 2000   breast aug/ lipo   LIPOSUCTION Bilateral 11/15/2020   Procedure: LIPOSUCTION;  Surgeon: Peggye Form, DO;  Location: [facility];  Service: Plastics;  Laterality: Bilateral;   MASTOPEXY Bilateral 11/15/2020   Procedure: MASTOPEXY;  Surgeon: Peggye Form, DO;  Location: [facility];  Service: Plastics;  Laterality: Bilateral;   PORT-A-CATH REMOVAL Right 04/05/2020   Procedure: REMOVAL PORT-A-CATH;  Surgeon: Harriette Bouillon, MD;  Location: [facility];  Service: General;  Laterality: Right;   PORTACATH PLACEMENT Right 01/27/2019   Procedure: INSERTION  PORT-A-CATH WITH ULTRASOUND;  Surgeon: Harriette Bouillon, MD;  Location: [facility];  Service: General;  Laterality: Right;   Social History   Tobacco Use   Smoking status: Former    Current packs/day: 0.00    Average packs/day: 0.3 packs/day for 5.0 years (1.3 ttl pk-yrs)    Types: Cigarettes    Start date: 01/20/1973    Quit date: 01/20/1978    Years since quitting: 44.9   Smokeless tobacco: Never   Tobacco comments:    1982  Vaping Use   Vaping  status: Never Used  Substance Use Topics   Alcohol use: Yes    Alcohol/week: 6.0 standard drinks of alcohol    Types: 6 Glasses of wine per week    Comment: occasional   Drug use: No   Social History   Socioeconomic History   Marital status: Married    Spouse name: Not on file   Number of children: 2   Years of education: Not on file   Highest education level: Not on file  Occupational History   Occupation: retired  Tobacco Use   Smoking status: Former    Current packs/day: 0.00    Average packs/day: 0.3 packs/day for 5.0 years (1.3 ttl pk-yrs)    Types: Cigarettes    Start date: 01/20/1973    Quit date: 01/20/1978    Years since quitting: 44.9   Smokeless tobacco: Never   Tobacco comments:    1982  Vaping Use   Vaping status: Never Used  Substance and Sexual Activity   Alcohol use: Yes    Alcohol/week: 6.0 standard drinks of alcohol    Types: 6 Glasses of wine per week    Comment: occasional   Drug use: No   Sexual activity: Yes    Birth control/protection: Post-menopausal, None  Other Topics Concern   Not on file  Social History Narrative   Not on file   Social Determinants of Health   Financial Resource Strain: Not on file  Food Insecurity: Not on file  Transportation Needs: Not on file  Physical Activity: Not on file  Stress: Not on file  Social Connections: Not on file  Intimate Partner Violence: Not on file   Family Status  Relation Name Status   Mother Vernona Rieger Alive   Father Danny Alive    Sister Amy Alive   Brother  Alive   MGM  Deceased   Mat Aunt Conservation officer, historic buildings (Not Specified)   MGF Elroy Channel (Not Specified)   Neg Hx  (Not Specified)  No partnership data on file   Family History  Problem Relation Age of Onset   Diabetes Mother    Heart disease Mother    Hyperlipidemia Mother    Breast cancer Mother 6   Cancer Mother        breast DCIS   Hearing loss Mother    Varicose Veins Mother    Cancer Father 25       prostate cancer    Diabetes Father    Hypertension Father    Hyperlipidemia Father    Heart disease Father    Hearing loss Father    Breast cancer Sister 32   Cancer Sister 19       breast cancer    Cancer Maternal Aunt 55       breast cancer    Cancer Maternal Grandfather        colon cancer   Colon cancer Maternal Grandfather    Esophageal cancer Neg Hx    Rectal cancer Neg Hx    Stomach cancer Neg Hx    No Known Allergies    Review of Systems  All other systems reviewed and are negative.     Objective:     There were no vitals taken for this visit. BP Readings from Last 3 Encounters:  12/02/22 138/83  06/24/22 124/68  06/02/22 (!) 148/89   Wt Readings from Last 3 Encounters:  12/02/22 177 lb 3.2 oz (80.4 kg)  06/24/22 177 lb 14.4 oz (80.7 kg)  06/02/22 178 lb 14.4 oz (  81.1 kg)      Physical Exam Constitutional:      Appearance: Normal appearance.  HENT:     Head: Normocephalic and atraumatic.  Eyes:     Conjunctiva/sclera: Conjunctivae normal.  Cardiovascular:     Rate and Rhythm: Normal rate and regular rhythm.  Pulmonary:     Effort: Pulmonary effort is normal.     Breath sounds: Normal breath sounds.  Skin:    General: Skin is warm and dry.  Neurological:     General: No focal deficit present.     Mental Status: She is alert. Mental status is at baseline.  Psychiatric:        Mood and Affect: Mood normal.        Behavior: Behavior normal.      No results found for any visits on 12/25/22.  Last CBC Lab Results   Component Value Date   WBC 4.3 12/02/2022   HGB 14.9 12/02/2022   HCT 44.1 12/02/2022   MCV 90.9 12/02/2022   MCH 30.7 12/02/2022   RDW 12.1 12/02/2022   PLT 172 12/02/2022   Last metabolic panel Lab Results  Component Value Date   GLUCOSE 105 (H) 12/02/2022   NA 141 12/02/2022   K 4.2 12/02/2022   CL 107 12/02/2022   CO2 29 12/02/2022   BUN 13 12/02/2022   CREATININE 0.78 12/02/2022   GFRNONAA >60 12/02/2022   CALCIUM 9.6 12/02/2022   PROT 6.6 12/02/2022   ALBUMIN 4.3 12/02/2022   LABGLOB 2.2 06/12/2021   AGRATIO 2.0 06/12/2021   BILITOT 0.4 12/02/2022   ALKPHOS 49 12/02/2022   AST 16 12/02/2022   ALT 22 12/02/2022   ANIONGAP 5 12/02/2022   Last lipids Lab Results  Component Value Date   CHOL 164 06/24/2022   HDL 62 06/24/2022   LDLCALC 78 06/24/2022   TRIG 138 06/24/2022   CHOLHDL 2.6 06/24/2022   Last hemoglobin A1c Lab Results  Component Value Date   HGBA1C 5.5 06/12/2021   Last thyroid functions Lab Results  Component Value Date   TSH 1.600 06/12/2021   T3TOTAL 88 02/02/2019   Last vitamin D Lab Results  Component Value Date   VD25OH 55.2 06/12/2021   Last vitamin B12 and Folate Lab Results  Component Value Date   VITAMINB12 833 03/29/2020      The 10-year ASCVD risk score (Arnett DK, et al., 2019) is: 4.9%    Assessment & Plan:   1. Anxiety/Other insomnia: Stable, doing well with Lexapro 5 mg, insomnia improved, not taking trazodone currently. Labs due.   - CBC w/Diff/Platelet - COMPLETE METABOLIC PANEL WITH GFR  2. Weight gain: Discussed at length. Unfortunately she does not qualify for GLP-1's and due to risk of side effects I do not prescribe Phentermine. Discussed calorie restriction to 1600-1700 daily and increasing activity.   3. Hyperlipidemia, unspecified hyperlipidemia type: Recheck lipids now back on Crestor.   - Lipid Profile  4. Need for hepatitis C screening test/Encounter for screening for HIV: Screening tests  due.  - Hepatitis C Antibody - HIV antibody (with reflex)   No follow-ups on file.    Margarita Mail, DO

## 2022-12-25 ENCOUNTER — Encounter: Payer: Self-pay | Admitting: Internal Medicine

## 2022-12-25 ENCOUNTER — Ambulatory Visit
Admission: RE | Admit: 2022-12-25 | Discharge: 2022-12-25 | Disposition: A | Discharge status: Home / Self Care | Payer: 59 | Source: Ambulatory Visit | Attending: Internal Medicine | Admitting: Internal Medicine

## 2022-12-25 ENCOUNTER — Ambulatory Visit
Admission: RE | Admit: 2022-12-25 | Discharge: 2022-12-25 | Disposition: A | Discharge status: Home / Self Care | Payer: 59 | Attending: Internal Medicine | Admitting: Internal Medicine

## 2022-12-25 ENCOUNTER — Ambulatory Visit: Payer: 59 | Admitting: Internal Medicine

## 2022-12-25 VITALS — BP 108/72 | HR 94 | Temp 97.6°F | Resp 16 | Ht 63.0 in | Wt 179.2 lb

## 2022-12-25 DIAGNOSIS — M25561 Pain in right knee: Secondary | ICD-10-CM | POA: Insufficient documentation

## 2022-12-25 DIAGNOSIS — E66811 Obesity, class 1: Secondary | ICD-10-CM

## 2022-12-25 DIAGNOSIS — E782 Mixed hyperlipidemia: Secondary | ICD-10-CM | POA: Diagnosis not present

## 2022-12-25 DIAGNOSIS — Z6830 Body mass index (BMI) 30.0-30.9, adult: Secondary | ICD-10-CM

## 2022-12-25 MED ORDER — ZEPBOUND 2.5 MG/0.5ML ~~LOC~~ SOAJ: 2.5000 mg | SUBCUTANEOUS | 0 refills | Status: DC

## 2022-12-25 MED ORDER — MELOXICAM 7.5 MG PO TABS: 7.5000 mg | ORAL_TABLET | ORAL | 0 refills | Status: DC | PRN

## 2023-01-04 ENCOUNTER — Other Ambulatory Visit: Payer: Self-pay | Admitting: Internal Medicine

## 2023-01-04 DIAGNOSIS — F419 Anxiety disorder, unspecified: Secondary | ICD-10-CM

## 2023-01-06 NOTE — Telephone Encounter (Signed)
Requested Prescriptions  Pending Prescriptions Disp Refills   escitalopram (LEXAPRO) 5 MG tablet [Pharmacy Med Name: Escitalopram Oxalate 5 MG Oral Tablet] 90 tablet 1    Sig: TAKE 1 TABLET BY MOUTH DAILY     Psychiatry:  Antidepressants - SSRI Passed - 01/06/2023  8:39 AM      Passed - Valid encounter within last 6 months    Recent Outpatient Visits           1 week ago Acute pain of right knee   [facility] Margarita Mail, DO   6 months ago Anxiety   [facility] Margarita Mail, DO   9 months ago Anxiety   [facility] Margarita Mail, DO   10 months ago Anxiety   [facility] Margarita Mail, DO   9 years ago Acute cystitis without hematuria   [facility], Gwenlyn Found, MD       Future Appointments             In 3 weeks Margarita Mail, DO [facility]

## 2023-01-18 ENCOUNTER — Ambulatory Visit
Admission: RE | Admit: 2023-01-18 | Discharge: 2023-01-18 | Disposition: A | Discharge status: Home / Self Care | Payer: 59 | Source: Ambulatory Visit | Attending: Nurse Practitioner | Admitting: Nurse Practitioner

## 2023-01-18 DIAGNOSIS — C50411 Malignant neoplasm of upper-outer quadrant of right female breast: Secondary | ICD-10-CM

## 2023-01-18 MED ORDER — GADOPICLENOL 0.5 MMOL/ML IV SOLN
7.0000 mL | Freq: Once | INTRAVENOUS | Status: AC | PRN
  Administered 2023-01-18: 7 mL via INTRAVENOUS

## 2023-01-29 ENCOUNTER — Other Ambulatory Visit: Payer: Self-pay

## 2023-01-29 ENCOUNTER — Encounter: Payer: Self-pay | Admitting: Internal Medicine

## 2023-01-29 ENCOUNTER — Ambulatory Visit: Payer: 59 | Admitting: Internal Medicine

## 2023-01-29 VITALS — BP 124/82 | HR 82 | Temp 98.1°F | Resp 16 | Ht 63.0 in | Wt 168.9 lb

## 2023-01-29 DIAGNOSIS — E663 Overweight: Secondary | ICD-10-CM | POA: Diagnosis not present

## 2023-01-29 DIAGNOSIS — E785 Hyperlipidemia, unspecified: Secondary | ICD-10-CM

## 2023-01-29 MED ORDER — ROSUVASTATIN CALCIUM 20 MG PO TABS: 20.0000 mg | ORAL_TABLET | Freq: Every day | ORAL | 1 refills | Status: DC

## 2023-01-29 NOTE — Progress Notes (Signed)
 Established Patient Office Visit  Subjective   Patient ID: Alicia Hahn, female    DOB: Jul 29, 1960  Age: 63 y.o. MRN: [id number]  Chief Complaint  Patient presents with   Follow-up    4 week recheck    HPI  Patient is here to follow up on knee pain and weight gain. She was prescribe Zepbound  2.5 mg, however her insurance would not cover so she found a compounding pharmacy to get compounded Zepbound . She's been taking this 2.5 mg dose weekly for 4 weeks now and has lost 9 pounds. She denies any side effects. Her knee pain has now resolved.    Health Maintenance: -Blood work UTD -Mammogram MRI 12/24 Birads-2  -Colonoscopy 5/21, repeat in 10 years -Pap 5/22 negative   Patient Active Problem List   Diagnosis Date Noted   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 (HCC) 06/12/2021   Osteopenia 06/12/2021   Pain of left hand 11/05/2020   History of breast cancer 08/08/2020   Acquired absence of breast 08/08/2020   Class 1 obesity with serious comorbidity and body mass index (BMI) of 30.0 to 30.9 in adult 05/02/2020   Effects of radiation 03/23/2020   Postoperative breast asymmetry 03/23/2020   Elevated liver enzymes 04/25/2019   Hyperlipidemia 04/25/2019   Chemoprophylaxis 04/25/2019   Port-A-Cath in place 03/30/2019   Cervical radiculopathy at C7 on L  02/02/2019   Psychophysiological insomnia 02/02/2019   Numbness and tingling of hand-  C7 distribution L hand 02/02/2019   Stress and adjustment reaction 02/02/2019   Genetic testing 12/30/2018   Malignant neoplasm of upper-outer quadrant of right breast in female, estrogen receptor positive (HCC) 12/28/2018   Malignant tumor of breast (HCC) 12/22/2018   Family history of breast cancer in sister(age33) and mother 11/02/2018   Plantar fasciitis of left foot 11/02/2018   Muscle ache of extremity-bilateral calf muscles 11/02/2018   Impingement syndrome of right shoulder 05/11/2017   Past Medical History:  Diagnosis  Date   Acid reflux    occasionally, and takes pepcid OTC   Anxiety    Breast cancer (HCC)    01-2019   Cancer (HCC)    breast cancer   Depression    Recent   Gastric ulcer    GERD (gastroesophageal reflux disease)    Hyperlipidemia    Joint pain    Kidney stones    Lower extremity edema    Neuromuscular disorder (HCC)    Recent muscle pain   Personal history of chemotherapy    Personal history of radiation therapy    04-2019   Stomach ulcer    Vitamin D  deficiency    Past Surgical History:  Procedure Laterality Date   AUGMENTATION MAMMAPLASTY     BREAST IMPLANT EXCHANGE Bilateral 11/15/2020   Procedure: BREAST IMPLANT EXCHANGE;  Surgeon: Lowery Estefana RAMAN, DO;  Location: [facility];  Service: Plastics;  Laterality: Bilateral;   BREAST LUMPECTOMY     BREAST LUMPECTOMY WITH RADIOACTIVE SEED AND SENTINEL LYMPH NODE BIOPSY Right 01/27/2019   Procedure: RIGHT BREAST LUMPECTOMY WITH RADIOACTIVE SEED AND SENTINEL LYMPH NODE MAPPING;  Surgeon: Vanderbilt Ned, MD;  Location: [facility];  Service: General;  Laterality: Right;   COSMETIC SURGERY Bilateral 2000   breast aug/ lipo   LIPOSUCTION Bilateral 11/15/2020   Procedure: LIPOSUCTION;  Surgeon: Lowery Estefana RAMAN, DO;  Location: [facility];  Service: Plastics;  Laterality: Bilateral;   MASTOPEXY Bilateral 11/15/2020   Procedure: MASTOPEXY;  Surgeon: Lowery,  Estefana RAMAN, DO;  Location: [facility];  Service: Plastics;  Laterality: Bilateral;   PORT-A-CATH REMOVAL Right 04/05/2020   Procedure: REMOVAL PORT-A-CATH;  Surgeon: Vanderbilt Ned, MD;  Location: [facility];  Service: General;  Laterality: Right;   PORTACATH PLACEMENT Right 01/27/2019   Procedure: INSERTION PORT-A-CATH WITH ULTRASOUND;  Surgeon: Vanderbilt Ned, MD;  Location: [facility];  Service: General;  Laterality: Right;   Social History   Tobacco Use   Smoking status:  Former    Current packs/day: 0.00    Average packs/day: 0.3 packs/day for 5.0 years (1.3 ttl pk-yrs)    Types: Cigarettes    Start date: 01/20/1973    Quit date: 01/20/1978    Years since quitting: 45.0   Smokeless tobacco: Never   Tobacco comments:    1982  Vaping Use   Vaping status: Never Used  Substance Use Topics   Alcohol use: Yes    Alcohol/week: 6.0 standard drinks of alcohol    Types: 6 Glasses of wine per week    Comment: occasional   Drug use: No   Social History   Socioeconomic History   Marital status: Married    Spouse name: Not on file   Number of children: 2   Years of education: Not on file   Highest education level: Not on file  Occupational History   Occupation: retired  Tobacco Use   Smoking status: Former    Current packs/day: 0.00    Average packs/day: 0.3 packs/day for 5.0 years (1.3 ttl pk-yrs)    Types: Cigarettes    Start date: 01/20/1973    Quit date: 01/20/1978    Years since quitting: 45.0   Smokeless tobacco: Never   Tobacco comments:    1982  Vaping Use   Vaping status: Never Used  Substance and Sexual Activity   Alcohol use: Yes    Alcohol/week: 6.0 standard drinks of alcohol    Types: 6 Glasses of wine per week    Comment: occasional   Drug use: No   Sexual activity: Yes    Birth control/protection: Post-menopausal, None  Other Topics Concern   Not on file  Social History Narrative   Not on file   Social Drivers of Health   Financial Resource Strain: Not on file  Food Insecurity: Not on file  Transportation Needs: Not on file  Physical Activity: Not on file  Stress: Not on file  Social Connections: Not on file  Intimate Partner Violence: Not on file   Family Status  Relation Name Status   Mother Leita Alive   Father [place] Alive   Sister Amy Alive   Brother  Alive   MGM  Deceased   Mat Aunt Conservation Officer, [employer] Buildings (Not Specified)   MGF Celena (Not Specified)   Neg Hx  (Not Specified)  No partnership data on file   Family History   Problem Relation Age of Onset   Diabetes Mother    Heart disease Mother    Hyperlipidemia Mother    Breast cancer Mother 70   Cancer Mother        breast DCIS   Hearing loss Mother    Varicose Veins Mother    Cancer Father 49       prostate cancer    Diabetes Father    Hypertension Father    Hyperlipidemia Father    Heart disease Father    Hearing loss Father    Breast cancer Sister 42   Cancer Sister  31       breast cancer    Cancer Maternal Aunt 55       breast cancer    Cancer Maternal Grandfather        colon cancer   Colon cancer Maternal Grandfather    Esophageal cancer Neg Hx    Rectal cancer Neg Hx    Stomach cancer Neg Hx    No Known Allergies    Review of Systems  All other systems reviewed and are negative.     Objective:     BP 124/82   Pulse 82   Temp 98.1 F (36.7 C) (Oral)   Resp 16   Ht 5' 3 (1.6 m)   Wt 168 lb 14.4 oz (76.6 kg)   SpO2 99%   BMI 29.92 kg/m  BP Readings from Last 3 Encounters:  01/29/23 124/82  12/25/22 108/72  12/02/22 138/83   Wt Readings from Last 3 Encounters:  01/29/23 168 lb 14.4 oz (76.6 kg)  12/25/22 179 lb 3.2 oz (81.3 kg)  12/02/22 177 lb 3.2 oz (80.4 kg)      Physical Exam Constitutional:      Appearance: Normal appearance.  HENT:     Head: Normocephalic and atraumatic.  Eyes:     Conjunctiva/sclera: Conjunctivae normal.  Cardiovascular:     Rate and Rhythm: Normal rate and regular rhythm.  Pulmonary:     Effort: Pulmonary effort is normal.     Breath sounds: Normal breath sounds.  Skin:    General: Skin is warm and dry.  Neurological:     General: No focal deficit present.     Mental Status: She is alert. Mental status is at baseline.  Psychiatric:        Mood and Affect: Mood normal.        Behavior: Behavior normal.      No results found for any visits on 01/29/23.  Last CBC Lab Results  Component Value Date   WBC 4.3 12/02/2022   HGB 14.9 12/02/2022   HCT 44.1 12/02/2022    MCV 90.9 12/02/2022   MCH 30.7 12/02/2022   RDW 12.1 12/02/2022   PLT 172 12/02/2022   Last metabolic panel Lab Results  Component Value Date   GLUCOSE 105 (H) 12/02/2022   NA 141 12/02/2022   K 4.2 12/02/2022   CL 107 12/02/2022   CO2 29 12/02/2022   BUN 13 12/02/2022   CREATININE 0.78 12/02/2022   GFRNONAA >60 12/02/2022   CALCIUM  9.6 12/02/2022   PROT 6.6 12/02/2022   ALBUMIN 4.3 12/02/2022   LABGLOB 2.2 06/12/2021   AGRATIO 2.0 06/12/2021   BILITOT 0.4 12/02/2022   ALKPHOS 49 12/02/2022   AST 16 12/02/2022   ALT 22 12/02/2022   ANIONGAP 5 12/02/2022   Last lipids Lab Results  Component Value Date   CHOL 164 06/24/2022   HDL 62 06/24/2022   LDLCALC 78 06/24/2022   TRIG 138 06/24/2022   CHOLHDL 2.6 06/24/2022   Last hemoglobin A1c Lab Results  Component Value Date   HGBA1C 5.5 06/12/2021   Last thyroid  functions Lab Results  Component Value Date   TSH 1.600 06/12/2021   T3TOTAL 88 02/02/2019   Last vitamin D  Lab Results  Component Value Date   VD25OH 55.2 06/12/2021   Last vitamin B12 and Folate Lab Results  Component Value Date   VITAMINB12 833 03/29/2020      The 10-year ASCVD risk score (Arnett DK, et al., 2019) is: 4%  Assessment & Plan:   1. Hyperlipidemia, unspecified hyperlipidemia type (Primary): Stable, refill statin. Plan to recheck fasting labs at follow up.   - rosuvastatin  (CRESTOR ) 20 MG tablet; Take 1 tablet (20 mg total) by mouth daily.  Dispense: 90 tablet; Refill: 1  2. Overweight (BMI 25.0-29.9): Doing well on the compounded Zepbound  2.5 mg, she will continue the compounding medication due to cost but will let me know if she needs any future prescriptions from me.    Return in about 6 months (around 07/29/2023).    Sharyle Fischer, DO

## 2023-05-19 ENCOUNTER — Other Ambulatory Visit: Payer: Self-pay | Admitting: Internal Medicine

## 2023-05-19 DIAGNOSIS — E785 Hyperlipidemia, unspecified: Secondary | ICD-10-CM

## 2023-05-22 NOTE — Assessment & Plan Note (Signed)
pT1cN0M0, stage IA, Triple Positive, Grade 3 -Diagnosed in 12/2018 s/p right lumpectomy and adjuvant radiation.  -She completed adjuvant chemotherapy with Weekly Abraxane and trastuzumab-anns (Kanjinti)  02/23/19-05/11/19 and maintenance Kanjinti (01/25/20). -She started antiestrogen therapy with Letrozole in 08/2019. She is tolerating well with manageable hot flashes and joint aches so far.  -Last mammogram 06/2021 and screening breast MRI 12/2021 were negative. I reviewed MRI with her today. -Continue surveillance/high risk screening

## 2023-05-22 NOTE — Telephone Encounter (Signed)
 Too soon for refill, last refill 01/29/23 for 90 and 1 refill.  Requested Prescriptions  Pending Prescriptions Disp Refills   rosuvastatin  (CRESTOR ) 20 MG tablet [Pharmacy Med Name: Rosuvastatin  Calcium  20 MG Oral Tablet] 120 tablet 2    Sig: TAKE 1 TABLET BY MOUTH DAILY     Cardiovascular:  Antilipid - Statins 2 Failed - 05/22/2023  1:44 PM      Failed - Valid encounter within last 12 months    Recent Outpatient Visits           9 years ago Acute cystitis without hematuria   [facility] at [facility], Skipper Dumas, MD   10 years ago Acute pyelonephritis   [facility] at [facility], Eleanore E, PA-C       Future Appointments             In 2 months Rockney Cid, DO [facility]            Failed - Lipid Panel in normal range within the last 12 months    Cholesterol, Total  Date Value Ref Range Status  06/12/2021 186 100 - 199 mg/dL Final   Cholesterol  Date Value Ref Range Status  06/24/2022 164 <200 mg/dL Final   LDL Cholesterol (Calc)  Date Value Ref Range Status  06/24/2022 78 mg/dL (calc) Final    Comment:    Reference range: <100 . Desirable range <100 mg/dL for primary prevention;   <70 mg/dL for patients with CHD or diabetic patients  with > or = 2 CHD risk factors. Aaron Aas LDL-C is now calculated using the Martin-Hopkins  calculation, which is a validated novel method providing  better accuracy than the Friedewald equation in the  estimation of LDL-C.  Melinda Sprawls et al. Erroll Heard. 1191;478(29): 2061-2068  (http://education.[facility].com/faq/FAQ164)    HDL  Date Value Ref Range Status  06/24/2022 62 > OR = 50 mg/dL Final  56/21/3086 77 >57 mg/dL Final   Triglycerides  Date Value Ref Range Status  06/24/2022 138 <150 mg/dL Final         Passed - Cr in normal range and within 360 days    Creatinine  Date Value Ref Range Status  12/02/2022 0.78 0.44 - 1.00 mg/dL Final   Creat  Date Value Ref Range Status   06/24/2022 0.73 0.50 - 1.05 mg/dL Final         Passed - Patient is not pregnant

## 2023-05-25 ENCOUNTER — Other Ambulatory Visit (HOSPITAL_COMMUNITY): Payer: Self-pay

## 2023-05-25 ENCOUNTER — Encounter: Payer: Self-pay | Admitting: Hematology

## 2023-05-25 ENCOUNTER — Inpatient Hospital Stay (HOSPITAL_BASED_OUTPATIENT_CLINIC_OR_DEPARTMENT_OTHER): Payer: 59 | Admitting: Hematology

## 2023-05-25 ENCOUNTER — Inpatient Hospital Stay: Payer: 59 | Attending: Hematology

## 2023-05-25 VITALS — BP 104/64 | HR 72 | Temp 98.1°F | Resp 18 | Ht 63.0 in | Wt 155.7 lb

## 2023-05-25 DIAGNOSIS — Z170 Estrogen receptor positive status [ER+]: Secondary | ICD-10-CM | POA: Diagnosis not present

## 2023-05-25 DIAGNOSIS — M8580 Other specified disorders of bone density and structure, unspecified site: Secondary | ICD-10-CM | POA: Insufficient documentation

## 2023-05-25 DIAGNOSIS — Z79810 Long term (current) use of selective estrogen receptor modulators (SERMs): Secondary | ICD-10-CM | POA: Insufficient documentation

## 2023-05-25 DIAGNOSIS — E2839 Other primary ovarian failure: Secondary | ICD-10-CM

## 2023-05-25 DIAGNOSIS — C50411 Malignant neoplasm of upper-outer quadrant of right female breast: Secondary | ICD-10-CM | POA: Insufficient documentation

## 2023-05-25 LAB — CBC WITH DIFFERENTIAL (CANCER CENTER ONLY)
Abs Immature Granulocytes: 0.01 10*3/uL (ref 0.00–0.07)
Basophils Absolute: 0 10*3/uL (ref 0.0–0.1)
Basophils Relative: 1 %
Eosinophils Absolute: 0.1 10*3/uL (ref 0.0–0.5)
Eosinophils Relative: 3 %
HCT: 41.9 % (ref 36.0–46.0)
Hemoglobin: 14.4 g/dL (ref 12.0–15.0)
Immature Granulocytes: 0 %
Lymphocytes Relative: 34 %
Lymphs Abs: 1.3 10*3/uL (ref 0.7–4.0)
MCH: 30.6 pg (ref 26.0–34.0)
MCHC: 34.4 g/dL (ref 30.0–36.0)
MCV: 89.1 fL (ref 80.0–100.0)
Monocytes Absolute: 0.5 10*3/uL (ref 0.1–1.0)
Monocytes Relative: 14 %
Neutro Abs: 1.8 10*3/uL (ref 1.7–7.7)
Neutrophils Relative %: 48 %
Platelet Count: 157 10*3/uL (ref 150–400)
RBC: 4.7 MIL/uL (ref 3.87–5.11)
RDW: 12.6 % (ref 11.5–15.5)
WBC Count: 3.7 10*3/uL — ABNORMAL LOW (ref 4.0–10.5)
nRBC: 0 % (ref 0.0–0.2)

## 2023-05-25 LAB — CMP (CANCER CENTER ONLY)
ALT: 31 U/L (ref 0–44)
AST: 29 U/L (ref 15–41)
Albumin: 4 g/dL (ref 3.5–5.0)
Alkaline Phosphatase: 46 U/L (ref 38–126)
Anion gap: 4 — ABNORMAL LOW (ref 5–15)
BUN: 15 mg/dL (ref 8–23)
CO2: 29 mmol/L (ref 22–32)
Calcium: 8.5 mg/dL — ABNORMAL LOW (ref 8.9–10.3)
Chloride: 108 mmol/L (ref 98–111)
Creatinine: 0.71 mg/dL (ref 0.44–1.00)
GFR, Estimated: >60 mL/min (ref >60)
Glucose, Bld: 103 mg/dL — ABNORMAL HIGH (ref 70–99)
Potassium: 3.9 mmol/L (ref 3.5–5.1)
Sodium: 141 mmol/L (ref 135–145)
Total Bilirubin: 0.3 mg/dL (ref 0.0–1.2)
Total Protein: 6.2 g/dL — ABNORMAL LOW (ref 6.5–8.1)

## 2023-05-25 MED ORDER — TAMOXIFEN CITRATE 20 MG PO TABS
20.0000 mg | ORAL_TABLET | Freq: Every day | ORAL | 2 refills | Status: AC
  Filled 2023-05-25: qty 100, 100d supply, fill #0
  Filled 2024-01-12: qty 100, 100d supply, fill #1

## 2023-05-25 NOTE — Progress Notes (Signed)
 [facility]   [phone] [phone]   Clinic Follow up Note   Patient Care Team: Rockney Cid, DO as PCP - General (Internal Medicine) Bensimhon, Rheta Celestine, MD as PCP - Advanced Heart Failure (Cardiology) Arnie Lao, MD as Consulting Physician (Orthopedic Surgery) Woodrow Hazy, MD as Consulting Physician (Obstetrics and Gynecology) Sim Dryer, MD as Consulting Physician (General Surgery) Sonja Monee, MD as Consulting Physician (Hematology) Johna Myers, MD as Consulting Physician (Radiation Oncology) Auther Bo, RN as Oncology Nurse Navigator Alane Hsu, RN as Oncology Nurse Navigator Burton, [name], NP as Nurse Practitioner (Nurse Practitioner)  Date of Service:  05/25/2023  CHIEF COMPLAINT: f/u of breast cancer   CURRENT THERAPY:  Tamoxifen    Oncology History   Malignant neoplasm of upper-outer quadrant of right breast in female, estrogen receptor positive (HCC) pT1cN0M0, stage IA, Triple Positive, Grade 3 -Diagnosed in 12/2018 s/p right lumpectomy and adjuvant radiation.  -She completed adjuvant chemotherapy with Weekly Abraxane  and trastuzumab -anns (Kanjinti )  02/23/19-05/11/19 and maintenance Kanjinti  (01/25/20). -She started antiestrogen therapy with Letrozole  in 08/2019. She is tolerating well with manageable hot flashes and joint aches so far.  -Last mammogram 06/2022 and screening breast MRI 12/2022 were negative.  -Continue surveillance/high risk screening  Assessment & Plan Breast cancer, stage 1C, grade 3, ER/HER2 positive Diagnosed in December 2020, currently on tamoxifen  since August 2021. She tolerates tamoxifen  well with occasional, tolerable nocturnal hot flushes and no significant side effects. The cancer is moderate risk due to grade and HER2 positivity. Tamoxifen  is preferred over other anti-estrogen therapies due to a lower risk of joint pain and osteopenia/osteoporosis. Plan to continue tamoxifen   for a total of 7 years due to the moderate risk profile. - Refill tamoxifen  for a 12-month supply and provide a 1-year prescription. - Schedule mammogram at the end of June. - Schedule follow-up with Lacie in 6 months and then annually after 5 years from diagnosis.  Osteopenia Diagnosed in June 2023 with no high-risk factors. Previous bone density scan indicated osteopenia but not severe. Management includes calcium , vitamin D  supplementation, and weight-bearing exercise. - Order bone density scan for this year. -continue calcium  and vitamin D  supplementation and weight-bearing exercise.  Plan -she is doing well overall -continue Tamoxifen  -lab and f/u with NP Lacie in 6 months then annually after next visit  -MM and DEXA at [facility] in late June 2025, she will call to schedule    SUMMARY OF ONCOLOGIC HISTORY: Oncology History Overview Note  Cancer Staging Malignant neoplasm of upper-outer quadrant of right breast in female, estrogen receptor positive (HCC) Staging form: Breast, AJCC 8th Edition - Clinical stage from 12/29/2018: Stage IA (cT1b, cN0, cM0, G2, ER+, PR+, HER2+) - Unsigned - Pathologic stage from 01/27/2019: Stage IA (pT1c, pN0, cM0, G3, ER+, PR+, HER2+) - Signed by Sonja [name], MD on 02/06/2019 Cancer Staging No matching staging information was found for the patient.    Malignant neoplasm of upper-outer quadrant of right breast in female, estrogen receptor positive (HCC)  12/22/2018 Mammogram    Diagnostic Mammogram 12/22/18  IMPRESSION: 1. 9 mm mass, with associated calcifications and architectural distortion, in the 11 o'clock position of the right breast, highly suspicious for breast carcinoma. 2. Questionable mass noted in the inferior aspect of the right breast remains equivocal on diagnostic imaging may reflect focal fibroglandular tissue. There is no sonographic correlate.   12/23/2018 Initial Biopsy   Diagnosis 12/23/18 Breast, right, needle core biopsy, 11 o'clock  position - INVASIVE DUCTAL  CARCINOMA, SEE COMMENT. - DUCTAL CARCINOMA IN SITU WITH NECROSIS.   12/28/2018 Initial Diagnosis   Malignant neoplasm of upper-outer quadrant of right breast in female, estrogen receptor positive (HCC)   01/13/2019 Imaging   MRI Breast 01/13/19  IMPRESSION: 1. Biopsy proven malignancy in the upper-outer right breast. No additional suspicious findings on the right. 2. No MRI evidence of malignancy on the left. 3. No suspicious lymphadenopathy.   01/27/2019 Surgery   RIGHT BREAST LUMPECTOMY WITH RADIOACTIVE SEED AND SENTINEL LYMPH NODE MAPPING and PAC placement by Dr Afton Horse  01/27/19   01/27/2019 Pathology Results   FINAL MICROSCOPIC DIAGNOSIS: 01/27/19  A. BREAST, RIGHT, LUMPECTOMY:  - Invasive ductal carcinoma, grade 3, spanning 1.3 cm.  - High grade ductal carcinoma in situ with necrosis.  - Biopsy site.  - Final resection margins are negative for carcinoma.  - See oncology table.   B. BREAST, RIGHT ADDITIONAL SUPERIOR MARGIN, EXCISION:  - Fibrocystic change and adenosis.  - No malignancy identified.   C. LYMPH NODE, RIGHT #1, SENTINEL, BIOPSY:  - One of one lymph nodes negative for carcinoma (0/1).   D. LYMPH NODE, RIGHT, SENTINEL, BIOPSY:  - One of one lymph nodes negative for carcinoma (0/1).   E. LYMPH NODE, RIGHT, SENTINEL, BIOPSY:  - One of one lymph nodes negative for carcinoma (0/1).   F. LYMPH NODE, RIGHT, SENTINEL, BIOPSY:  - One of one lymph nodes negative for carcinoma (0/1).       01/27/2019 Cancer Staging   Staging form: Breast, AJCC 8th Edition - Pathologic stage from 01/27/2019: Stage IA (pT1c, pN0, cM0, G3, ER+, PR+, HER2+) - Signed by Sonja [name], MD on 02/06/2019   02/08/2019 Imaging   CT AP W Contrast 02/08/19  IMPRESSION: 1. Wall thickening of the gastric antrum may represent gastritis.   Aortic Atherosclerosis (ICD10-I70.0).   02/23/2019 - 01/25/2020 Chemotherapy   Adjuvant weekly Abraxane  with trastuzumab -anns (Kanjinti )  q3weeks starting 02/23/19. Completed 12 week of Abraxane  on 05/11/19. Maintenance trastuzumab -anns (Kanjinti ) q3weeks start 05/18/19. Plan to complete 01/25/20.   06/13/2019 - 08/01/2019 Radiation Therapy   Radiation with Dr Jeryl Moris starting 06/13/19-08/01/19   08/2019 -  Anti-estrogen oral therapy   Letrozole , beginning 08/25/2019    09/28/2019 Survivorship   SCP delivered by Lacie Burton, NP       Discussed the use of AI scribe software for clinical note transcription with the patient, who gave verbal consent to proceed.  History of Present Illness Alicia Burkemper "Alicia Hahn" is a 63 year old female with breast cancer who presents for follow-up.  Diagnosed with stage 1C, grade 3, HER2 positive breast cancer in December 2020. She has been on tamoxifen  since August 2021, experiencing occasional but tolerable hot flushes at night. A breast MRI in December 2024 showed no evidence of recurrence.  She feels better overall with no current pain or tenderness. Tirzepatide  has contributed to weight loss and alleviated achy leg pains. No gastrointestinal issues.  A bone density scan in June 2023 revealed osteopenia. She takes calcium  and vitamin D  supplements and engages in weight-bearing exercises. Medication management has changed due to insurance, and she now uses the [facility] pharmacy. She has retired from [employer].     All other systems were reviewed with the patient and are negative.  MEDICAL HISTORY:  Past Medical History:  Diagnosis Date   Acid reflux    occasionally, and takes pepcid OTC   Anxiety    Breast cancer (HCC)    01-2019  Cancer [facility])    breast cancer   Depression    Recent   Gastric ulcer    GERD (gastroesophageal reflux disease)    Hyperlipidemia    Joint pain    Kidney stones    Lower extremity edema    Neuromuscular disorder (HCC)    Recent muscle pain   Personal history of chemotherapy    Personal history of radiation therapy    04-2019   Stomach  ulcer    Vitamin D  deficiency     SURGICAL HISTORY: Past Surgical History:  Procedure Laterality Date   AUGMENTATION MAMMAPLASTY     BREAST IMPLANT EXCHANGE Bilateral 11/15/2020   Procedure: BREAST IMPLANT EXCHANGE;  Surgeon: Thornell Flirt, DO;  Location: [facility];  Service: Plastics;  Laterality: Bilateral;   BREAST LUMPECTOMY     BREAST LUMPECTOMY WITH RADIOACTIVE SEED AND SENTINEL LYMPH NODE BIOPSY Right 01/27/2019   Procedure: RIGHT BREAST LUMPECTOMY WITH RADIOACTIVE SEED AND SENTINEL LYMPH NODE MAPPING;  Surgeon: Sim Dryer, MD;  Location: [facility];  Service: General;  Laterality: Right;   COSMETIC SURGERY Bilateral 2000   breast aug/ lipo   LIPOSUCTION Bilateral 11/15/2020   Procedure: LIPOSUCTION;  Surgeon: Thornell Flirt, DO;  Location: [facility];  Service: Plastics;  Laterality: Bilateral;   MASTOPEXY Bilateral 11/15/2020   Procedure: MASTOPEXY;  Surgeon: Thornell Flirt, DO;  Location: [facility];  Service: Plastics;  Laterality: Bilateral;   PORT-A-CATH REMOVAL Right 04/05/2020   Procedure: REMOVAL PORT-A-CATH;  Surgeon: Sim Dryer, MD;  Location: [facility];  Service: General;  Laterality: Right;   PORTACATH PLACEMENT Right 01/27/2019   Procedure: INSERTION PORT-A-CATH WITH ULTRASOUND;  Surgeon: Sim Dryer, MD;  Location: [facility];  Service: General;  Laterality: Right;    I have reviewed the social history and family history with the patient and they are unchanged from previous note.  ALLERGIES:  has no known allergies.  MEDICATIONS:  Current Outpatient Medications  Medication Sig Dispense Refill   CALCIUM  PO Take 1,000 mg by mouth daily.     escitalopram  (LEXAPRO ) 5 MG tablet TAKE 1 TABLET BY MOUTH DAILY 90 tablet 1   estradiol  (ESTRACE  VAGINAL) 0.1 MG/GM vaginal cream Use a fingertip size amount of cream on your finger and place slightly  past the vaginal entrance 3 times a week at bedtime. Do not use the applicator if this comes with the cream. 42.5 g 6   MAGNESIUM MALATE PO Take 400 mg by mouth daily.     omeprazole (PRILOSEC OTC) 20 MG tablet Take 20 mg by mouth daily. As needed     rosuvastatin  (CRESTOR ) 20 MG tablet Take 1 tablet (20 mg total) by mouth daily. 90 tablet 1   tirzepatide  (ZEPBOUND ) 2.5 MG/0.5ML Pen Inject 2.5 mg into the skin once a week. 2 mL 0   meloxicam  (MOBIC ) 7.5 MG tablet Take 1 tablet (7.5 mg total) by mouth as needed for pain. (Patient not taking: Reported on 05/25/2023) 30 tablet 0   tamoxifen  (NOLVADEX ) 20 MG tablet Take 1 tablet (20 mg total) by mouth daily. 120 tablet 2   traZODone  (DESYREL ) 50 MG tablet Take 1 tablet (50 mg total) by mouth at bedtime as needed for sleep. Can take 50-100 mg as needed at night for insomnia. (Patient not taking: Reported on 05/25/2023) 90 tablet 1   No current facility-administered medications for this visit.    PHYSICAL EXAMINATION: ECOG PERFORMANCE STATUS: 0 -  Asymptomatic  Vitals:   05/25/23 1407  BP: 104/64  Pulse: 72  Resp: 18  Temp: 98.1 F (36.7 C)  SpO2: 99%   Wt Readings from Last 3 Encounters:  05/25/23 155 lb 11.2 oz (70.6 kg)  01/29/23 168 lb 14.4 oz (76.6 kg)  12/25/22 179 lb 3.2 oz (81.3 kg)     GENERAL:alert, no distress and comfortable SKIN: skin color, texture, turgor are normal, no rashes or significant lesions EYES: normal, Conjunctiva are pink and non-injected, sclera clear NECK: supple, thyroid  normal size, non-tender, without nodularity LYMPH:  no palpable lymphadenopathy in the cervical, axillary  LUNGS: clear to auscultation and percussion with normal breathing effort HEART: regular rate & rhythm and no murmurs and no lower extremity edema ABDOMEN:abdomen soft, non-tender and normal bowel sounds Musculoskeletal:no cyanosis of digits and no clubbing  NEURO: alert & oriented x 3 with fluent speech, no focal motor/sensory  deficits Breasts: Breast inspection showed them to be symmetrical with no nipple discharge. Palpation of the breasts and axilla revealed no obvious mass that I could appreciate.  Physical Exam   LABORATORY DATA:  I have reviewed the data as listed    Latest Ref Rng & Units 05/25/2023    1:47 PM 12/02/2022   10:52 AM 06/24/2022   11:21 AM  CBC  WBC 4.0 - 10.5 K/uL 3.7  4.3  3.8   Hemoglobin 12.0 - 15.0 g/dL 95.6  38.7  56.4   Hematocrit 36.0 - 46.0 % 41.9  44.1  43.7   Platelets 150 - 400 K/uL 157  172  166         Latest Ref Rng & Units 05/25/2023    1:47 PM 12/02/2022   10:52 AM 06/24/2022   11:21 AM  CMP  Glucose 70 - 99 mg/dL 332  951  96   BUN 8 - 23 mg/dL 15  13  17    Creatinine 0.44 - 1.00 mg/dL 8.84  1.66  0.63   Sodium 135 - 145 mmol/L 141  141  142   Potassium 3.5 - 5.1 mmol/L 3.9  4.2  4.2   Chloride 98 - 111 mmol/L 108  107  108   CO2 22 - 32 mmol/L 29  29  22    Calcium  8.9 - 10.3 mg/dL 8.5  9.6  9.6   Total Protein 6.5 - 8.1 g/dL 6.2  6.6  6.6   Total Bilirubin 0.0 - 1.2 mg/dL 0.3  0.4  0.3   Alkaline Phos 38 - 126 U/L 46  49    AST 15 - 41 U/L 29  16  18    ALT 0 - 44 U/L 31  22  17        RADIOGRAPHIC STUDIES: I have personally reviewed the radiological images as listed and agreed with the findings in the report. No results found.    Orders Placed This Encounter  Procedures   DG Bone Density    Standing Status:   Future    Expected Date:   07/13/2023    Expiration Date:   05/24/2024    Reason for Exam (SYMPTOM  OR DIAGNOSIS REQUIRED):   screening    Preferred imaging location?:   [facility]   All questions were answered. The patient knows to call the clinic with any problems, questions or concerns. No barriers to learning was detected. The total time spent in the appointment was 25 minutes.     Sonja Bethania, MD 05/25/2023

## 2023-06-01 ENCOUNTER — Other Ambulatory Visit: Payer: Self-pay | Admitting: Internal Medicine

## 2023-06-01 DIAGNOSIS — F419 Anxiety disorder, unspecified: Secondary | ICD-10-CM

## 2023-06-02 ENCOUNTER — Other Ambulatory Visit: Payer: Self-pay | Admitting: Hematology

## 2023-06-02 DIAGNOSIS — C50411 Malignant neoplasm of upper-outer quadrant of right female breast: Secondary | ICD-10-CM

## 2023-06-03 NOTE — Telephone Encounter (Signed)
 Rx 01/06/23 #90 1RF- too soon Requested Prescriptions  Pending Prescriptions Disp Refills   escitalopram  (LEXAPRO ) 5 MG tablet [Pharmacy Med Name: Escitalopram  Oxalate 5 MG Oral Tablet] 90 tablet 3    Sig: TAKE 1 TABLET BY MOUTH DAILY     There is no refill protocol information for this order

## 2023-07-17 ENCOUNTER — Ambulatory Visit
Admission: RE | Admit: 2023-07-17 | Discharge: 2023-07-17 | Disposition: A | Discharge status: Home / Self Care | Source: Ambulatory Visit | Attending: Hematology | Admitting: Hematology

## 2023-07-17 DIAGNOSIS — C50411 Malignant neoplasm of upper-outer quadrant of right female breast: Secondary | ICD-10-CM

## 2023-07-17 DIAGNOSIS — Z1231 Encounter for screening mammogram for malignant neoplasm of breast: Secondary | ICD-10-CM | POA: Diagnosis not present

## 2023-07-31 ENCOUNTER — Encounter: Payer: Self-pay | Admitting: Internal Medicine

## 2023-07-31 ENCOUNTER — Other Ambulatory Visit (HOSPITAL_COMMUNITY): Payer: Self-pay

## 2023-07-31 ENCOUNTER — Ambulatory Visit (INDEPENDENT_AMBULATORY_CARE_PROVIDER_SITE_OTHER): Payer: Self-pay | Admitting: Internal Medicine

## 2023-07-31 ENCOUNTER — Other Ambulatory Visit: Payer: Self-pay

## 2023-07-31 VITALS — BP 124/72 | HR 86 | Temp 98.1°F | Resp 16 | Ht 63.0 in | Wt 149.7 lb

## 2023-07-31 DIAGNOSIS — F419 Anxiety disorder, unspecified: Secondary | ICD-10-CM | POA: Diagnosis not present

## 2023-07-31 DIAGNOSIS — E559 Vitamin D deficiency, unspecified: Secondary | ICD-10-CM | POA: Diagnosis not present

## 2023-07-31 DIAGNOSIS — E785 Hyperlipidemia, unspecified: Secondary | ICD-10-CM | POA: Diagnosis not present

## 2023-07-31 DIAGNOSIS — E663 Overweight: Secondary | ICD-10-CM

## 2023-07-31 MED ORDER — ROSUVASTATIN CALCIUM 20 MG PO TABS
20.0000 mg | ORAL_TABLET | Freq: Every day | ORAL | 3 refills | Status: AC
  Filled 2023-07-31: qty 90, 90d supply, fill #0
  Filled 2024-01-12: qty 90, 90d supply, fill #1

## 2023-07-31 MED ORDER — ESCITALOPRAM OXALATE 5 MG PO TABS
5.0000 mg | ORAL_TABLET | Freq: Every day | ORAL | 3 refills | Status: AC
  Filled 2023-07-31: qty 90, 90d supply, fill #0
  Filled 2024-01-12: qty 90, 90d supply, fill #1

## 2023-07-31 NOTE — Progress Notes (Signed)
 Established Patient Office Visit  Subjective   Patient ID: Alicia Hahn, female    DOB: 27-Aug-1960  Age: 63 y.o. MRN: [id number]  Chief Complaint  Patient presents with   Medical Management of Chronic Issues    6 month recheck    HPI  Patient is here to follow up on chronic medical conditions.     HLD: -Medications: Crestor  20 mg -Patient is compliant with above medications and reports no side effects. -Last lipid panel: Lipid Panel     Component Value Date/Time   CHOL 164 06/24/2022 1121   CHOL 186 06/12/2021 1018   TRIG 138 06/24/2022 1121   HDL 62 06/24/2022 1121   HDL 77 06/12/2021 1018   CHOLHDL 2.6 06/24/2022 1121   LDLCALC 78 06/24/2022 1121   LABVLDL 22 06/12/2021 1018    Hx of Breast Cancer -Following with Oncology, last seen 05/25/23 -Mammogram Birads-2 7/25  Anxiety -Currently on Lexapro  5 mg -Symptoms well controlled and tolerating medication well, happy with current dose  Health Maintenance: -Blood work due -Mammogram MRI 7/25 Birads-2  -Colonoscopy 5/21, repeat in 10 years -Pap 5/22 negative  -Due for Prevnar 20 - unfortunately we are out of pneumonia vaccines currently, will discuss again at follow up  Patient Active Problem List   Diagnosis Date Noted   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 (HCC) 06/12/2021   Osteopenia 06/12/2021   Pain of left hand 11/05/2020   History of breast cancer 08/08/2020   Acquired absence of breast 08/08/2020   Class 1 obesity with serious comorbidity and body mass index (BMI) of 30.0 to 30.9 in adult 05/02/2020   Effects of radiation 03/23/2020   Postoperative breast asymmetry 03/23/2020   Elevated liver enzymes 04/25/2019   Hyperlipidemia 04/25/2019   Chemoprophylaxis 04/25/2019   Port-A-Cath in place 03/30/2019   Cervical radiculopathy at C7 on L  02/02/2019   Psychophysiological insomnia 02/02/2019   Numbness and tingling of hand-  C7 distribution L hand 02/02/2019   Stress and adjustment  reaction 02/02/2019   Genetic testing 12/30/2018   Malignant neoplasm of upper-outer quadrant of right breast in female, estrogen receptor positive (HCC) 12/28/2018   Malignant tumor of breast (HCC) 12/22/2018   Family history of breast cancer in sister(age33) and mother 11/02/2018   Plantar fasciitis of left foot 11/02/2018   Muscle ache of extremity-bilateral calf muscles 11/02/2018   Impingement syndrome of right shoulder 05/11/2017   Past Medical History:  Diagnosis Date   Acid reflux    occasionally, and takes pepcid OTC   Anxiety    Breast cancer (HCC)    01-2019   Cancer (HCC)    breast cancer   Depression    Recent   Gastric ulcer    GERD (gastroesophageal reflux disease)    Hyperlipidemia    Joint pain    Kidney stones    Lower extremity edema    Neuromuscular disorder (HCC)    Recent muscle pain   Personal history of chemotherapy    Personal history of radiation therapy    04-2019   Stomach ulcer    Vitamin D  deficiency    Past Surgical History:  Procedure Laterality Date   AUGMENTATION MAMMAPLASTY     BREAST IMPLANT EXCHANGE Bilateral 11/15/2020   Procedure: BREAST IMPLANT EXCHANGE;  Surgeon: Lowery Estefana RAMAN, DO;  Location: [facility];  Service: Plastics;  Laterality: Bilateral;   BREAST LUMPECTOMY     BREAST LUMPECTOMY WITH RADIOACTIVE SEED AND SENTINEL LYMPH NODE BIOPSY  Right 01/27/2019   Procedure: RIGHT BREAST LUMPECTOMY WITH RADIOACTIVE SEED AND SENTINEL LYMPH NODE MAPPING;  Surgeon: Vanderbilt Ned, MD;  Location: [facility];  Service: General;  Laterality: Right;   COSMETIC SURGERY Bilateral 2000   breast aug/ lipo   LIPOSUCTION Bilateral 11/15/2020   Procedure: LIPOSUCTION;  Surgeon: Lowery Estefana RAMAN, DO;  Location: [facility];  Service: Plastics;  Laterality: Bilateral;   MASTOPEXY Bilateral 11/15/2020   Procedure: MASTOPEXY;  Surgeon: Lowery Estefana RAMAN, DO;  Location: [facility];   Service: Plastics;  Laterality: Bilateral;   PORT-A-CATH REMOVAL Right 04/05/2020   Procedure: REMOVAL PORT-A-CATH;  Surgeon: Vanderbilt Ned, MD;  Location: [facility];  Service: General;  Laterality: Right;   PORTACATH PLACEMENT Right 01/27/2019   Procedure: INSERTION PORT-A-CATH WITH ULTRASOUND;  Surgeon: Vanderbilt Ned, MD;  Location: [facility];  Service: General;  Laterality: Right;   Social History   Tobacco Use   Smoking status: Former    Current packs/day: 0.00    Average packs/day: 0.3 packs/day for 5.0 years (1.3 ttl pk-yrs)    Types: Cigarettes    Start date: 01/20/1973    Quit date: 01/20/1978    Years since quitting: 45.5   Smokeless tobacco: Never   Tobacco comments:    1982  Vaping Use   Vaping status: Never Used  Substance Use Topics   Alcohol use: Yes    Alcohol/week: 6.0 standard drinks of alcohol    Types: 6 Glasses of wine per week    Comment: occasional   Drug use: No   Social History   Socioeconomic History   Marital status: Married    Spouse name: Not on file   Number of children: 2   Years of education: Not on file   Highest education level: Not on file  Occupational History   Occupation: retired  Tobacco Use   Smoking status: Former    Current packs/day: 0.00    Average packs/day: 0.3 packs/day for 5.0 years (1.3 ttl pk-yrs)    Types: Cigarettes    Start date: 01/20/1973    Quit date: 01/20/1978    Years since quitting: 45.5   Smokeless tobacco: Never   Tobacco comments:    1982  Vaping Use   Vaping status: Never Used  Substance and Sexual Activity   Alcohol use: Yes    Alcohol/week: 6.0 standard drinks of alcohol    Types: 6 Glasses of wine per week    Comment: occasional   Drug use: No   Sexual activity: Yes    Birth control/protection: Post-menopausal, None  Other Topics Concern   Not on file  Social History Narrative   Not on file   Social Drivers of Health   Financial Resource Strain: Not on file   Food Insecurity: Not on file  Transportation Needs: Not on file  Physical Activity: Not on file  Stress: Not on file  Social Connections: Not on file  Intimate Partner Violence: Not on file   Family Status  Relation Name Status   Mother Leita Alive   Father [place] Alive   Sister Amy Alive   Brother  Alive   MGM  Deceased   Mat Aunt Ronalda (Not Specified)   MGF Celena (Not Specified)   Neg Hx  (Not Specified)  No partnership data on file   Family History  Problem Relation Age of Onset   Diabetes Mother    Heart disease Mother    Hyperlipidemia Mother  Breast cancer Mother 62   Cancer Mother        breast DCIS   Hearing loss Mother    Varicose Veins Mother    Cancer Father 6       prostate cancer    Diabetes Father    Hypertension Father    Hyperlipidemia Father    Heart disease Father    Hearing loss Father    Breast cancer Sister 41   Cancer Sister 76       breast cancer    Cancer Maternal Aunt 55       breast cancer    Cancer Maternal Grandfather        colon cancer   Colon cancer Maternal Grandfather    Esophageal cancer Neg Hx    Rectal cancer Neg Hx    Stomach cancer Neg Hx    No Known Allergies    Review of Systems  All other systems reviewed and are negative.     Objective:     BP 124/72 (Cuff Size: Normal)   Pulse 86   Temp 98.1 F (36.7 C) (Oral)   Resp 16   Ht 5' 3 (1.6 m)   Wt 149 lb 11.2 oz (67.9 kg)   SpO2 96%   BMI 26.52 kg/m  BP Readings from Last 3 Encounters:  07/31/23 124/72  05/25/23 104/64  01/29/23 124/82   Wt Readings from Last 3 Encounters:  07/31/23 149 lb 11.2 oz (67.9 kg)  05/25/23 155 lb 11.2 oz (70.6 kg)  01/29/23 168 lb 14.4 oz (76.6 kg)      Physical Exam Constitutional:      Appearance: Normal appearance.  HENT:     Head: Normocephalic and atraumatic.     Mouth/Throat:     Mouth: Mucous membranes are moist.     Pharynx: Oropharynx is clear.  Eyes:     Extraocular Movements: Extraocular movements  intact.     Conjunctiva/sclera: Conjunctivae normal.     Pupils: Pupils are equal, round, and reactive to light.  Neck:     Comments: No thyromegaly  Cardiovascular:     Rate and Rhythm: Normal rate and regular rhythm.  Pulmonary:     Effort: Pulmonary effort is normal.     Breath sounds: Normal breath sounds.  Musculoskeletal:     Cervical back: No tenderness.     Right lower leg: No edema.     Left lower leg: No edema.  Lymphadenopathy:     Cervical: No cervical adenopathy.  Skin:    General: Skin is warm and dry.  Neurological:     General: No focal deficit present.     Mental Status: She is alert. Mental status is at baseline.  Psychiatric:        Mood and Affect: Mood normal.        Behavior: Behavior normal.      No results found for any visits on 07/31/23.  Last CBC Lab Results  Component Value Date   WBC 3.7 (L) 05/25/2023   HGB 14.4 05/25/2023   HCT 41.9 05/25/2023   MCV 89.1 05/25/2023   MCH 30.6 05/25/2023   RDW 12.6 05/25/2023   PLT 157 05/25/2023   Last metabolic panel Lab Results  Component Value Date   GLUCOSE 103 (H) 05/25/2023   NA 141 05/25/2023   K 3.9 05/25/2023   CL 108 05/25/2023   CO2 29 05/25/2023   BUN 15 05/25/2023   CREATININE 0.71 05/25/2023   GFRNONAA >60 05/25/2023  CALCIUM  8.5 (L) 05/25/2023   PROT 6.2 (L) 05/25/2023   ALBUMIN 4.0 05/25/2023   LABGLOB 2.2 06/12/2021   AGRATIO 2.0 06/12/2021   BILITOT 0.3 05/25/2023   ALKPHOS 46 05/25/2023   AST 29 05/25/2023   ALT 31 05/25/2023   ANIONGAP 4 (L) 05/25/2023   Last lipids Lab Results  Component Value Date   CHOL 164 06/24/2022   HDL 62 06/24/2022   LDLCALC 78 06/24/2022   TRIG 138 06/24/2022   CHOLHDL 2.6 06/24/2022   Last hemoglobin A1c Lab Results  Component Value Date   HGBA1C 5.5 06/12/2021   Last thyroid  functions Lab Results  Component Value Date   TSH 1.600 06/12/2021   T3TOTAL 88 02/02/2019   Last vitamin D  Lab Results  Component Value Date    VD25OH 55.2 06/12/2021   Last vitamin B12 and Folate Lab Results  Component Value Date   VITAMINB12 833 03/29/2020      The 10-year ASCVD risk score (Arnett DK, et al., 2019) is: 4.5%    Assessment & Plan:   Assessment & Plan Weight management On tirzepatide  with weight loss, concerned about loose skin. Strength training recommended. Tirzepatide  obtained from compounding pharmacy, more affordable than savings program. Insurance coverage satisfactory. - Continue tirzepatide  5 mg weekly from compounding pharmacy. - Incorporate strength training exercises. - Consider Pilates for toning and flexibility.  Hyperlipidemia Well-managed on Crestor . Plans to recheck cholesterol levels. - Recheck cholesterol levels. - Continue Crestor .  Vitamin D  deficiency Takes calcium  with vitamin D . Plans to check vitamin D  levels due to potential adverse effects of deficiency or excess. Excess vitamin D  can increase risk of kidney stones and constipation, especially with tirzepatide . - Check vitamin D  levels. - Adjust vitamin D  supplementation if necessary.  Protein intake Low protein intake due to dietary preferences and tirzepatide  effects. Adequate protein important for health. Fairlife protein shakes recommended. - Consider Fairlife protein shakes to increase protein intake.  Anxiety Symptoms well controlled. Continue and refill Lexapro .   General Health Maintenance Discussed pneumonia vaccine due to age, well tolerated and covered by insurance. - Will administer pneumonia vaccine at follow up.  Follow-up Plans to follow up in six months, with possible extension if labs stable. Advised to contact office if earlier appointment needed. - Schedule follow-up appointment in six months. - Review lab results by Monday.  - CBC w/Diff/Platelet - Comprehensive Metabolic Panel (CMET) - Lipid Profile - rosuvastatin  (CRESTOR ) 20 MG tablet; Take 1 tablet (20 mg total) by mouth daily.  Dispense: 90  tablet; Refill: 3 - escitalopram  (LEXAPRO ) 5 MG tablet; Take 1 tablet (5 mg total) by mouth daily.  Dispense: 90 tablet; Refill: 3 - Vitamin D  (25 hydroxy)   Return in about 6 months (around 01/31/2024).    Sharyle Fischer, DO

## 2023-08-01 LAB — CBC WITH DIFFERENTIAL/PLATELET
Absolute Lymphocytes: 1718 {cells}/uL (ref 850–3900)
Absolute Monocytes: 416 {cells}/uL (ref 200–950)
Basophils Absolute: 50 {cells}/uL (ref 0–200)
Basophils Relative: 1.2 %
Eosinophils Absolute: 88 {cells}/uL (ref 15–500)
Eosinophils Relative: 2.1 %
HCT: 47.4 % — ABNORMAL HIGH (ref 35.0–45.0)
Hemoglobin: 15.3 g/dL (ref 11.7–15.5)
MCH: 29.7 pg (ref 27.0–33.0)
MCHC: 32.3 g/dL (ref 32.0–36.0)
MCV: 92 fL (ref 80.0–100.0)
MPV: 9.9 fL (ref 7.5–12.5)
Monocytes Relative: 9.9 %
Neutro Abs: 1928 {cells}/uL (ref 1500–7800)
Neutrophils Relative %: 45.9 %
Platelets: 158 Thousand/uL (ref 140–400)
RBC: 5.15 Million/uL — ABNORMAL HIGH (ref 3.80–5.10)
RDW: 11.9 % (ref 11.0–15.0)
Total Lymphocyte: 40.9 %
WBC: 4.2 Thousand/uL (ref 3.8–10.8)

## 2023-08-01 LAB — COMPREHENSIVE METABOLIC PANEL WITH GFR
AG Ratio: 1.9 (calc) (ref 1.0–2.5)
ALT: 24 U/L (ref 6–29)
AST: 22 U/L (ref 10–35)
Albumin: 4.5 g/dL (ref 3.6–5.1)
Alkaline phosphatase (APISO): 50 U/L (ref 37–153)
BUN: 16 mg/dL (ref 7–25)
CO2: 26 mmol/L (ref 20–32)
Calcium: 9.8 mg/dL (ref 8.6–10.4)
Chloride: 103 mmol/L (ref 98–110)
Creat: 0.78 mg/dL (ref 0.50–1.05)
Globulin: 2.4 g/dL (ref 1.9–3.7)
Glucose, Bld: 90 mg/dL (ref 65–99)
Potassium: 4.5 mmol/L (ref 3.5–5.3)
Sodium: 138 mmol/L (ref 135–146)
Total Bilirubin: 0.4 mg/dL (ref 0.2–1.2)
Total Protein: 6.9 g/dL (ref 6.1–8.1)
eGFR: 84 mL/min/1.73m2 (ref >=60)

## 2023-08-01 LAB — LIPID PANEL
Cholesterol: 164 mg/dL (ref <200)
HDL: 57 mg/dL (ref >=50)
LDL Cholesterol (Calc): 86 mg/dL
Non-HDL Cholesterol (Calc): 107 mg/dL (ref <130)
Total CHOL/HDL Ratio: 2.9 (calc) (ref <5.0)
Triglycerides: 114 mg/dL (ref <150)

## 2023-08-01 LAB — VITAMIN D 25 HYDROXY (VIT D DEFICIENCY, FRACTURES): Vit D, 25-Hydroxy: 46 ng/mL (ref 30–100)

## 2023-08-03 ENCOUNTER — Ambulatory Visit: Payer: Self-pay | Admitting: Internal Medicine

## 2023-08-26 ENCOUNTER — Other Ambulatory Visit: Payer: Self-pay

## 2023-08-26 ENCOUNTER — Ambulatory Visit: Admitting: Podiatry

## 2023-08-26 DIAGNOSIS — L603 Nail dystrophy: Secondary | ICD-10-CM

## 2023-08-26 DIAGNOSIS — L600 Ingrowing nail: Secondary | ICD-10-CM

## 2023-08-26 DIAGNOSIS — L608 Other nail disorders: Secondary | ICD-10-CM | POA: Diagnosis not present

## 2023-08-26 MED ORDER — NEOMYCIN-POLYMYXIN-HC 3.5-10000-1 OT SOLN
1.0000 [drp] | Freq: Two times a day (BID) | OTIC | 0 refills | Status: DC
  Filled 2023-08-26: qty 10, 50d supply, fill #0

## 2023-08-26 NOTE — Patient Instructions (Signed)
 Epsom Salt Soak Instructions    IF SOAKING IS STILL NECESSARY, START THIS 2 WEEKS AFTER INITIAL PROCEDURE   Place 1/4 cup of epsom salts in a quart of warm tap water.  Soak your foot or feet in the solution for 20 minutes twice a day until you notice the area has dried and a scab has formed. Continue to apply other medications to the area as directed by the doctor such as polysporin, neosporin or cortisporin drops.  IF YOUR SKIN BECOMES IRRITATED WHILE USING THESE INSTRUCTIONS, IT IS OKAY TO SWITCH TO  WHITE VINEGAR AND WATER. Or you may use antibacterial soap and water to keep the toe clean  Monitor for any signs/symptoms of infection. Call the office immediately if any occur or go directly to the emergency room. Call with any questions/concerns.

## 2023-08-26 NOTE — Progress Notes (Signed)
 Subjective:  Patient ID: Alicia Hahn, female    DOB: [birth year],  MRN: [id number] HPI Chief Complaint  Patient presents with   Nail Problem    NP - fungus on toe on right foot    63 y.o. female presents with the above complaint.   ROS: Denies fever chills nausea mobic  muscle aches pains calf pain back pain chest pain shortness of breath  Past Medical History:  Diagnosis Date   Acid reflux    occasionally, and takes pepcid OTC   Anxiety    Breast cancer (HCC)    01-2019   Cancer (HCC)    breast cancer   Depression    Recent   Gastric ulcer    GERD (gastroesophageal reflux disease)    Hyperlipidemia    Joint pain    Kidney stones    Lower extremity edema    Neuromuscular disorder (HCC)    Recent muscle pain   Personal history of chemotherapy    Personal history of radiation therapy    04-2019   Stomach ulcer    Vitamin D  deficiency    Past Surgical History:  Procedure Laterality Date   AUGMENTATION MAMMAPLASTY     BREAST IMPLANT EXCHANGE Bilateral 11/15/2020   Procedure: BREAST IMPLANT EXCHANGE;  Surgeon: Lowery Estefana RAMAN, DO;  Location: [facility];  Service: Plastics;  Laterality: Bilateral;   BREAST LUMPECTOMY     BREAST LUMPECTOMY WITH RADIOACTIVE SEED AND SENTINEL LYMPH NODE BIOPSY Right 01/27/2019   Procedure: RIGHT BREAST LUMPECTOMY WITH RADIOACTIVE SEED AND SENTINEL LYMPH NODE MAPPING;  Surgeon: Vanderbilt Ned, MD;  Location: [facility];  Service: General;  Laterality: Right;   COSMETIC SURGERY Bilateral 2000   breast aug/ lipo   LIPOSUCTION Bilateral 11/15/2020   Procedure: LIPOSUCTION;  Surgeon: Lowery Estefana RAMAN, DO;  Location: [facility];  Service: Plastics;  Laterality: Bilateral;   MASTOPEXY Bilateral 11/15/2020   Procedure: MASTOPEXY;  Surgeon: Lowery Estefana RAMAN, DO;  Location: [facility];  Service: Plastics;  Laterality: Bilateral;   PORT-A-CATH REMOVAL Right 04/05/2020    Procedure: REMOVAL PORT-A-CATH;  Surgeon: Vanderbilt Ned, MD;  Location: [facility];  Service: General;  Laterality: Right;   PORTACATH PLACEMENT Right 01/27/2019   Procedure: INSERTION PORT-A-CATH WITH ULTRASOUND;  Surgeon: Vanderbilt Ned, MD;  [facility];  Service: General;  Laterality: Right;    Current Outpatient Medications:    neomycin -polymyxin-hydrocortisone (CORTISPORIN ) OTIC solution, Apply 1-2 drops to toe twice daily after soaking, Disp: 10 mL, Rfl: 0   CALCIUM  PO, Take 1,000 mg by mouth daily., Disp: , Rfl:    escitalopram  (LEXAPRO ) 5 MG tablet, Take 1 tablet (5 mg total) by mouth daily., Disp: 90 tablet, Rfl: 3   estradiol  (ESTRACE  VAGINAL) 0.1 MG/GM vaginal cream, Use a fingertip size amount of cream on your finger and place slightly past the vaginal entrance 3 times a week at bedtime. Do not use the applicator if this comes with the cream., Disp: 42.5 g, Rfl: 6   MAGNESIUM MALATE PO, Take 400 mg by mouth daily., Disp: , Rfl:    omeprazole (PRILOSEC OTC) 20 MG tablet, Take 20 mg by mouth daily. As needed, Disp: , Rfl:    rosuvastatin  (CRESTOR ) 20 MG tablet, Take 1 tablet (20 mg total) by mouth daily., Disp: 90 tablet, Rfl: 3   tamoxifen  (NOLVADEX ) 20 MG tablet, Take 1 tablet (20 mg total) by mouth daily., Disp: 120 tablet, Rfl: 2   tirzepatide  (  MOUNJARO) 5 MG/0.5ML Pen, Inject 5 mg into the skin once a week., Disp: , Rfl:   No Known Allergies Review of Systems Objective:  There were no vitals filed for this visit.  General: Well developed, nourished, in no acute distress, alert and oriented x3   Dermatological: Skin is warm, dry and supple bilateral. Nails x 10 are well maintained; remaining integument appears unremarkable at this time. There are no open sores, no preulcerative lesions, no rash or signs of infection present.  Ingrown toenail fibular border hallux right nail dystrophy fibular border hallux right and second digit  right  Vascular: Dorsalis Pedis artery and Posterior Tibial artery pedal pulses are 2/4 bilateral with immedate capillary fill time. Pedal hair growth present. No varicosities and no lower extremity edema present bilateral.   Neruologic: Grossly intact via light touch bilateral. Vibratory intact via tuning fork bilateral. Protective threshold with Semmes Wienstein monofilament intact to all pedal sites bilateral. Patellar and Achilles deep tendon reflexes 2+ bilateral. No Babinski or clonus noted bilateral.   Musculoskeletal: No gross boney pedal deformities bilateral. No pain, crepitus, or limitation noted with foot and ankle range of motion bilateral. Muscular strength 5/5 in all groups tested bilateral.  Gait: Unassisted, Nonantalgic.    Radiographs:  None taken  Assessment & Plan:   Assessment: Ingrown toenail fibular border hallux right nail dystrophy hallux right second digit right  Plan: Chemical matricectomy was performed today to the fibular border of the hallux right after local anesthetic was administered.  Tolerated procedure well the nail was split from distal proximal exposing the root after avulsion.  It was then destroyed with 3 applications of phenol 30 seconds each neutralized with isopropyl alcohol Silvadene cream Telfa pad and a dry sterile compressive dressing was applied.  He was given both oral and written home-going instruction of the care and soaking of the toe.  Also provided prescription for Cortisporin  Otic to be applied twice daily after soaking.  Samples of skin and nail were sent today for pathologic evaluation I will follow-up with her in approximately 1 month.     Arlee Bossard T. [place], [place]

## 2023-08-27 ENCOUNTER — Other Ambulatory Visit: Payer: Self-pay

## 2023-09-03 ENCOUNTER — Other Ambulatory Visit: Payer: Self-pay

## 2023-09-30 ENCOUNTER — Ambulatory Visit: Admitting: Podiatry

## 2023-09-30 DIAGNOSIS — L603 Nail dystrophy: Secondary | ICD-10-CM | POA: Diagnosis not present

## 2023-09-30 MED ORDER — TERBINAFINE HCL 250 MG PO TABS: 250.0000 mg | ORAL_TABLET | Freq: Every day | ORAL | 2 refills | Status: DC

## 2023-09-30 MED ORDER — TERBINAFINE HCL 250 MG PO TABS: 250.0000 mg | ORAL_TABLET | Freq: Every day | ORAL | Status: DC

## 2023-09-30 NOTE — Progress Notes (Signed)
 She presents today for follow-up of her matrixectomy right hallux.  She states that seems to be doing pretty well.  She is also here for her nail pathology.  Objective: Vital signs stable oriented x 3.  Pulses are palpable.  There is no erythema edema salines drainage odor appears to be healing very nicely hallux right.  Nail pathology does demonstrate saprophytic fungus as well as some nail dystrophy.  Assessment: Onychomycosis nail dystrophy well-healing surgical toe.  Plan: Started her on Lamisil  250 mg tablets 1 tablet daily.  She was given the opportunity ask questions regarding this we did discuss the possible side effects and complications associated with this medication she understands and is amenable to it.  I will follow-up with her in 30 days for blood work.  Most recent blood work does demonstrate good liver function test.

## 2023-10-02 ENCOUNTER — Other Ambulatory Visit: Payer: Self-pay

## 2023-10-02 MED ORDER — FLUZONE HIGH-DOSE 0.5 ML IM SUSY
0.5000 mL | PREFILLED_SYRINGE | Freq: Once | INTRAMUSCULAR | 0 refills | Status: AC
  Filled 2023-10-02: qty 0.5, 1d supply, fill #0

## 2023-10-28 ENCOUNTER — Ambulatory Visit: Admitting: Podiatry

## 2023-10-28 DIAGNOSIS — Z79899 Other long term (current) drug therapy: Secondary | ICD-10-CM

## 2023-10-28 MED ORDER — TERBINAFINE HCL 250 MG PO TABS: 250.0000 mg | ORAL_TABLET | Freq: Every day | ORAL | 0 refills | Status: DC

## 2023-10-28 NOTE — Progress Notes (Signed)
 She presents today for follow-up of her Lamisil .  She denies fever chills nausea mobic  muscle aches pains calf pain back pain chest pain shortness of breath.  She is complete her first 30 days of Lamisil .  Objective: Vital signs stable alert oriented x 3 no change in physical exam.  Assessment: Long-term therapy with Lamisil .  Plan: Requesting a CMP and dispensing 90 more Lamisil  tablets.  Follow-up with her in 4 months.  Should her blood work come back abnormal we will notify her immediately.

## 2023-10-29 ENCOUNTER — Ambulatory Visit: Payer: Self-pay | Admitting: Podiatry

## 2023-10-29 LAB — HEPATIC FUNCTION PANEL
ALT: 22 IU/L (ref 0–32)
AST: 17 IU/L (ref 0–40)
Albumin: 4.3 g/dL (ref 3.9–4.9)
Alkaline Phosphatase: 56 IU/L (ref 49–135)
Bilirubin Total: 0.3 mg/dL (ref 0.0–1.2)
Bilirubin, Direct: 0.11 mg/dL (ref 0.00–0.40)
Total Protein: 6.6 g/dL (ref 6.0–8.5)

## 2023-10-29 NOTE — Progress Notes (Signed)
 Left detailed message.

## 2023-11-19 ENCOUNTER — Other Ambulatory Visit: Payer: Self-pay | Admitting: Vascular Surgery

## 2023-11-19 DIAGNOSIS — M7989 Other specified soft tissue disorders: Secondary | ICD-10-CM

## 2023-11-27 ENCOUNTER — Other Ambulatory Visit: Payer: Self-pay

## 2023-11-27 ENCOUNTER — Telehealth: Payer: Self-pay | Admitting: Nurse Practitioner

## 2023-11-27 DIAGNOSIS — Z170 Estrogen receptor positive status [ER+]: Secondary | ICD-10-CM

## 2023-11-27 NOTE — Telephone Encounter (Signed)
 Called the patient to inform them that their provider change .

## 2023-11-30 ENCOUNTER — Ambulatory Visit: Admitting: Nurse Practitioner

## 2023-11-30 ENCOUNTER — Other Ambulatory Visit

## 2023-12-01 NOTE — Assessment & Plan Note (Signed)
 [id number], stage IA, Triple Positive, Grade 3 -Diagnosed in 12/2018 s/p right lumpectomy and adjuvant radiation.  -She completed adjuvant chemotherapy with Weekly Abraxane  and trastuzumab -anns (Kanjinti )  02/23/19-05/11/19 and maintenance Kanjinti  (01/25/20). -She started antiestrogen therapy with Letrozole  in 08/2019. She is tolerating well with manageable hot flashes and joint aches so far.  -Mammogram from 06/2022 and screening breast MRI 12/2022 were negative.  -She had 3D screening mammogram (bilateral with implant) on 07/17/2023.  She has breast density category B.  Overall, results were benign.  -Continue surveillance/high risk screening

## 2023-12-01 NOTE — Progress Notes (Unsigned)
 Patient Care Team: Bernardo Fend, DO as PCP - General (Internal Medicine) Bensimhon, Toribio SAUNDERS, MD as PCP - Advanced Heart Failure (Cardiology) Vernetta Lonni GRADE, MD as Consulting Physician (Orthopedic Surgery) Johnnye Ade, MD as Consulting Physician (Obstetrics and Gynecology) Vanderbilt Ned, MD as Consulting Physician (General Surgery) Lanny Callander, MD as Consulting Physician (Hematology) Dewey Rush, MD as Consulting Physician (Radiation Oncology) Tyree Nanetta SAILOR, RN as Oncology Nurse Navigator Burton, [name], NP as Nurse Practitioner (Nurse Practitioner)  Clinic Day:  12/02/2023  Referring physician: Bernardo Fend, DO  ASSESSMENT & PLAN:   Assessment & Plan: Malignant neoplasm of upper-outer quadrant of right breast in female, estrogen receptor positive (HCC) pT1cN0M0, stage IA, Triple Positive, Grade 3 -Diagnosed in 12/2018 s/p right lumpectomy and adjuvant radiation.  -She completed adjuvant chemotherapy with Weekly Abraxane  and trastuzumab -anns (Kanjinti )  02/23/19-05/11/19 and maintenance Kanjinti  (01/25/20). -She started antiestrogen therapy with Letrozole  in 08/2019. Due to poor tolerance, it was switched to Exemestane  in Jan 2024.  She did not tolerated well, and it was changed to tamoxifen  in February 2024.  She has been tolerating that very well so far.  -Mammogram from 06/2022 and screening breast MRI 12/2022 were negative.  -She had 3D screening mammogram (bilateral with implant) on 07/17/2023.  She has breast density category B.  Overall, results were benign.  -Continue tamoxifen  daily. -Bilateral breast MRI ordered for December 2025. - Plan for labs and follow-up in 6 months, sooner if needed.   Vaginal atrophy Patient has been prescribed estradiol  cream previously to help with vaginal atrophy and dyspareunia.  States she has not used it in some time.  The tubes she has no as the tubes she had from initial prescription.  We discussed the benefits of using  small amounts of estrogen cream, vaginally, and 2 times weekly.  Will improve dyspareunia, incidence of UTI, and vaginal irritation.  A new prescription for estradiol  cream sent to her pharmacy.  Right breast cancer Patient continues to take tamoxifen  with manageable side effects including hot flashes and vaginal atrophy.  Hot flashes have improved and are more manageable.  Reviewed screening mammogram from June 2025 which was benign.  She will be due for bilateral breast MRI at the end of December 2025.  This was ordered as part of today's visit.  Plan Labs reviewed. - CBC and CMP are unremarkable. 3D screening mammogram from 06/2023 benign. Bilateral breast MRI due December 2025.  Ordered today. Renewed prescription for estradiol  cream.  Advised her to use a small amount vaginally, twice weekly. Continue tamoxifen  daily. Labs and follow-up in 6 months, sooner if needed.   The patient understands the plans discussed today and is in agreement with them.  She knows to contact our office if she develops concerns prior to her next appointment.  I provided 25 minutes of face-to-face time during this encounter and > 50% was spent counseling as documented under my assessment and plan.    Alicia FORBES Lessen, NP  [facility] - A DEPT OF [facility] [address] [place] [address] Dept: [phone] Dept Fax: [phone]   Orders Placed This Encounter  Procedures   MR BREAST BILATERAL W WO CONTRAST INC CAD    Standing Status:   Future    Expected Date:   01/19/2024    Expiration Date:   12/01/2024    If indicated for the ordered procedure, I authorize the administration of contrast media per Radiology  protocol:   Yes    What is the patient's sedation requirement?:   No Sedation    Does the patient have a pacemaker or implanted devices?:   No    Radiology Contrast Protocol - do NOT remove file path:    [email or web address]    Preferred imaging location?:   [facility] (table limit-550lbs)      CHIEF COMPLAINT:  CC: Right breast cancer, ER +/HER2+  Current Treatment: Tamoxifen  daily  INTERVAL HISTORY:  Alicia Hahn is here today for repeat clinical assessment.  She last saw Dr. Lanny on 05/25/2023.  She had 3D screening mammogram (bilateral with implant) on 07/17/2023.  She has breast density category B.  Overall, results were benign.  She should have bilateral breast MRI at the end of December 2025.  Has had more concerns with vaginal atrophy.  She has been given prescription previously for estradiol  cream.  States this helps symptoms associated with vaginal atrophy when she was seen.  States she has not used it for some time.  The tubes she has is old and they are expired.  She denies any changes, lumps, or palpable masses in either breast.  She denies chest pain, chest pressure, or shortness of breath. She denies headaches or visual disturbances. She denies abdominal pain, nausea, vomiting, or changes in bowel or bladder habits.  She denies fevers or chills. She denies pain. Her appetite is good. Her weight has been stable.  I have reviewed the past medical history, past surgical history, social history and family history with the patient and they are unchanged from previous note.  ALLERGIES:  has no known allergies.  MEDICATIONS:  Current Outpatient Medications  Medication Sig Dispense Refill   CALCIUM  PO Take 1,000 mg by mouth daily.     escitalopram  (LEXAPRO ) 5 MG tablet Take 1 tablet (5 mg total) by mouth daily. 90 tablet 3   estradiol  (ESTRACE ) 0.01 % CREA vaginal cream Use a fingertip size amount of cream on your finger and place slightly past the vaginal entrance 3 times a week at bedtime. Do not use the applicator if this comes with the cream. 42.5 g 12   omeprazole (PRILOSEC OTC) 20 MG tablet Take 20 mg by mouth daily. As needed     rosuvastatin   (CRESTOR ) 20 MG tablet Take 1 tablet (20 mg total) by mouth daily. 90 tablet 3   tamoxifen  (NOLVADEX ) 20 MG tablet Take 1 tablet (20 mg total) by mouth daily. 120 tablet 2   terbinafine  (LAMISIL ) 250 MG tablet Take 1 tablet (250 mg total) by mouth daily. 90 tablet 0   tirzepatide  (MOUNJARO) 5 MG/0.5ML Pen Inject 5 mg into the skin once a week.     Current Facility-Administered Medications  Medication Dose Route Frequency Provider Last Rate Last Admin   terbinafine  (LAMISIL ) tablet 250 mg  250 mg Oral Daily Hyatt, Max T, DPM        HISTORY OF PRESENT ILLNESS:   Oncology History Overview Note  Cancer Staging Malignant neoplasm of upper-outer quadrant of right breast in female, estrogen receptor positive (HCC) Staging form: Breast, AJCC 8th Edition - Clinical stage from 12/29/2018: Stage IA (cT1b, cN0, cM0, G2, ER+, PR+, HER2+) - Unsigned - Pathologic stage from 01/27/2019: Stage IA (pT1c, pN0, cM0, G3, ER+, PR+, HER2+) - Signed by Lanny Callander, MD on 02/06/2019 Cancer Staging No matching staging information was found for the patient.    Malignant neoplasm of upper-outer quadrant of right breast in female, estrogen receptor positive (HCC)  12/22/2018 Mammogram    Diagnostic Mammogram 12/22/18  IMPRESSION: 1. 9 mm mass, with associated calcifications and architectural distortion, in the 11 o'clock position of the right breast, highly suspicious for breast carcinoma. 2. Questionable mass noted in the inferior aspect of the right breast remains equivocal on diagnostic imaging may reflect focal fibroglandular tissue. There is no sonographic correlate.   12/23/2018 Initial Biopsy   Diagnosis 12/23/18 Breast, right, needle core biopsy, 11 o'clock position - INVASIVE DUCTAL CARCINOMA, SEE COMMENT. - DUCTAL CARCINOMA IN SITU WITH NECROSIS.   12/28/2018 Initial Diagnosis   Malignant neoplasm of upper-outer quadrant of right breast in female, estrogen receptor positive (HCC)   01/13/2019 Imaging    MRI Breast 01/13/19  IMPRESSION: 1. Biopsy proven malignancy in the upper-outer right breast. No additional suspicious findings on the right. 2. No MRI evidence of malignancy on the left. 3. No suspicious lymphadenopathy.   01/27/2019 Surgery   RIGHT BREAST LUMPECTOMY WITH RADIOACTIVE SEED AND SENTINEL LYMPH NODE MAPPING and PAC placement by Dr Vanderbilt  01/27/19   01/27/2019 Pathology Results   FINAL MICROSCOPIC DIAGNOSIS: 01/27/19  A. BREAST, RIGHT, LUMPECTOMY:  - Invasive ductal carcinoma, grade 3, spanning 1.3 cm.  - High grade ductal carcinoma in situ with necrosis.  - Biopsy site.  - Final resection margins are negative for carcinoma.  - See oncology table.   B. BREAST, RIGHT ADDITIONAL SUPERIOR MARGIN, EXCISION:  - Fibrocystic change and adenosis.  - No malignancy identified.   C. LYMPH NODE, RIGHT #1, SENTINEL, BIOPSY:  - One of one lymph nodes negative for carcinoma (0/1).   D. LYMPH NODE, RIGHT, SENTINEL, BIOPSY:  - One of one lymph nodes negative for carcinoma (0/1).   E. LYMPH NODE, RIGHT, SENTINEL, BIOPSY:  - One of one lymph nodes negative for carcinoma (0/1).   F. LYMPH NODE, RIGHT, SENTINEL, BIOPSY:  - One of one lymph nodes negative for carcinoma (0/1).       01/27/2019 Cancer Staging   Staging form: Breast, AJCC 8th Edition - Pathologic stage from 01/27/2019: Stage IA (pT1c, pN0, cM0, G3, ER+, PR+, HER2+) - Signed by Lanny Callander, MD on 02/06/2019   02/08/2019 Imaging   CT AP W Contrast 02/08/19  IMPRESSION: 1. Wall thickening of the gastric antrum may represent gastritis.   Aortic Atherosclerosis (ICD10-I70.0).   02/23/2019 - 01/25/2020 Chemotherapy   Adjuvant weekly Abraxane  with trastuzumab -anns (Kanjinti ) q3weeks starting 02/23/19. Completed 12 week of Abraxane  on 05/11/19. Maintenance trastuzumab -anns (Kanjinti ) q3weeks start 05/18/19. Plan to complete 01/25/20.   06/13/2019 - 08/01/2019 Radiation Therapy   Radiation with Dr Dewey starting 06/13/19-08/01/19   08/2019 -   Anti-estrogen oral therapy   Letrozole , beginning 08/25/2019    09/28/2019 Survivorship   SCP delivered by Lacie Burton, NP        REVIEW OF SYSTEMS:   Constitutional: Denies fevers, chills or abnormal weight loss Eyes: Denies blurriness of vision Ears, nose, mouth, throat, and face: Denies mucositis or sore throat Respiratory: Denies cough, dyspnea or wheezes Cardiovascular: Denies palpitation, chest discomfort or lower extremity swelling Gastrointestinal:  Denies nausea, heartburn or change in bowel habits Skin: Denies abnormal skin rashes Lymphatics: Denies new lymphadenopathy or easy bruising Neurological:Denies numbness, tingling or new weaknesses Behavioral/Psych: Mood is stable, no new changes  All other systems were reviewed with the patient and are negative.   VITALS:   Today's Vitals   12/02/23 1120  BP: 128/80  Pulse: 85  Resp: 18  Temp: 98.1 F (36.7 C)  TempSrc: Temporal  SpO2: 99%  Weight: 148 lb 12.8 oz (67.5 kg)  Height: 5' 3 (1.6 m)   Body mass index is 26.36 kg/m.   Wt Readings from Last 3 Encounters:  12/02/23 148 lb 12.8 oz (67.5 kg)  07/31/23 149 lb 11.2 oz (67.9 kg)  05/25/23 155 lb 11.2 oz (70.6 kg)    Body mass index is 26.36 kg/m.  Performance status (ECOG): 1 - Symptomatic but completely ambulatory  PHYSICAL EXAM:   GENERAL:alert, no distress and comfortable SKIN: skin color, texture, turgor are normal, no rashes or significant lesions EYES: normal, Conjunctiva are pink and non-injected, sclera clear OROPHARYNX:no exudate, no erythema and lips, buccal mucosa, and tongue normal  NECK: supple, thyroid  normal size, non-tender, without nodularity LYMPH:  no palpable lymphadenopathy in the cervical, axillary or inguinal LUNGS: clear to auscultation and percussion with normal breathing effort HEART: regular rate & rhythm and no murmurs and no lower extremity edema ABDOMEN:abdomen soft, non-tender and normal bowel sounds Musculoskeletal:no  cyanosis of digits and no clubbing  NEURO: alert & oriented x 3 with fluent speech, no focal motor/sensory deficits BREAST: There are well-healed surgical scars on the left breast.  No palpable lumps or masses are present.  There is intact implant palpable.  There is no nipple inversion inversion or nipple discharge.  There is no axillary lymphadenopathy on the left.  There are well-healed surgical scars on the right breast.  There are no palpable lumps or masses.  There is intact implant palpable.  There is no nipple inversion or nipple discharge.  There is no axillary lymphadenopathy on the right.  LABORATORY DATA:  I have reviewed the data as listed    Component Value Date/Time   NA 138 12/02/2023 1025   NA 141 06/12/2021 1018   K 4.4 12/02/2023 1025   CL 105 12/02/2023 1025   CO2 29 12/02/2023 1025   GLUCOSE 93 12/02/2023 1025   BUN 15 12/02/2023 1025   BUN 13 06/12/2021 1018   CREATININE 0.84 12/02/2023 1025   CREATININE 0.78 07/31/2023 1211   CALCIUM  9.1 12/02/2023 1025   PROT 6.8 12/02/2023 1025   PROT 6.6 10/28/2023 1509   ALBUMIN 4.1 12/02/2023 1025   ALBUMIN 4.3 10/28/2023 1509   AST 18 12/02/2023 1025   ALT 16 12/02/2023 1025   ALKPHOS 47 12/02/2023 1025   BILITOT 0.4 12/02/2023 1025   GFRNONAA >60 12/02/2023 1025   GFRAA 78 02/21/2020 0901   GFRAA >60 10/12/2019 1130    Lab Results  Component Value Date   WBC 4.5 12/02/2023   NEUTROABS 2.5 12/02/2023   HGB 15.4 (H) 12/02/2023   HCT 45.6 12/02/2023   MCV 87.9 12/02/2023   PLT 169 12/02/2023

## 2023-12-02 ENCOUNTER — Inpatient Hospital Stay: Attending: Nurse Practitioner

## 2023-12-02 ENCOUNTER — Encounter: Payer: Self-pay | Admitting: Nurse Practitioner

## 2023-12-02 ENCOUNTER — Inpatient Hospital Stay: Admitting: Nurse Practitioner

## 2023-12-02 ENCOUNTER — Other Ambulatory Visit: Payer: Self-pay

## 2023-12-02 VITALS — BP 128/80 | HR 85 | Temp 98.1°F | Resp 18 | Ht 63.0 in | Wt 148.8 lb

## 2023-12-02 DIAGNOSIS — Z170 Estrogen receptor positive status [ER+]: Secondary | ICD-10-CM | POA: Diagnosis not present

## 2023-12-02 DIAGNOSIS — C50411 Malignant neoplasm of upper-outer quadrant of right female breast: Secondary | ICD-10-CM | POA: Diagnosis not present

## 2023-12-02 DIAGNOSIS — Z79810 Long term (current) use of selective estrogen receptor modulators (SERMs): Secondary | ICD-10-CM | POA: Insufficient documentation

## 2023-12-02 LAB — CMP (CANCER CENTER ONLY)
ALT: 16 U/L (ref 0–44)
AST: 18 U/L (ref 15–41)
Albumin: 4.1 g/dL (ref 3.5–5.0)
Alkaline Phosphatase: 47 U/L (ref 38–126)
Anion gap: 4 — ABNORMAL LOW (ref 5–15)
BUN: 15 mg/dL (ref 8–23)
CO2: 29 mmol/L (ref 22–32)
Calcium: 9.1 mg/dL (ref 8.9–10.3)
Chloride: 105 mmol/L (ref 98–111)
Creatinine: 0.84 mg/dL (ref 0.44–1.00)
GFR, Estimated: >60 mL/min (ref >60)
Glucose, Bld: 93 mg/dL (ref 70–99)
Potassium: 4.4 mmol/L (ref 3.5–5.1)
Sodium: 138 mmol/L (ref 135–145)
Total Bilirubin: 0.4 mg/dL (ref 0.0–1.2)
Total Protein: 6.8 g/dL (ref 6.5–8.1)

## 2023-12-02 LAB — CBC WITH DIFFERENTIAL (CANCER CENTER ONLY)
Abs Immature Granulocytes: 0.01 K/uL (ref 0.00–0.07)
Basophils Absolute: 0 K/uL (ref 0.0–0.1)
Basophils Relative: 1 %
Eosinophils Absolute: 0.1 K/uL (ref 0.0–0.5)
Eosinophils Relative: 2 %
HCT: 45.6 % (ref 36.0–46.0)
Hemoglobin: 15.4 g/dL — ABNORMAL HIGH (ref 12.0–15.0)
Immature Granulocytes: 0 %
Lymphocytes Relative: 31 %
Lymphs Abs: 1.4 K/uL (ref 0.7–4.0)
MCH: 29.7 pg (ref 26.0–34.0)
MCHC: 33.8 g/dL (ref 30.0–36.0)
MCV: 87.9 fL (ref 80.0–100.0)
Monocytes Absolute: 0.5 K/uL (ref 0.1–1.0)
Monocytes Relative: 10 %
Neutro Abs: 2.5 K/uL (ref 1.7–7.7)
Neutrophils Relative %: 56 %
Platelet Count: 169 K/uL (ref 150–400)
RBC: 5.19 MIL/uL — ABNORMAL HIGH (ref 3.87–5.11)
RDW: 12.5 % (ref 11.5–15.5)
WBC Count: 4.5 K/uL (ref 4.0–10.5)
nRBC: 0 % (ref 0.0–0.2)

## 2023-12-02 MED ORDER — ESTRADIOL 0.01 % VA CREA
TOPICAL_CREAM | VAGINAL | 12 refills | Status: AC
  Filled 2023-12-02: qty 42.5, 30d supply, fill #0
  Filled 2024-01-12: qty 42.5, 30d supply, fill #1

## 2023-12-31 ENCOUNTER — Other Ambulatory Visit: Payer: Self-pay | Admitting: Podiatry

## 2024-01-01 ENCOUNTER — Encounter: Payer: Self-pay | Admitting: Podiatry

## 2024-01-05 MED ORDER — TERBINAFINE HCL 250 MG PO TABS: 250.0000 mg | ORAL_TABLET | Freq: Every day | ORAL | 0 refills | Status: DC

## 2024-01-07 ENCOUNTER — Ambulatory Visit (HOSPITAL_COMMUNITY): Admission: RE | Admit: 2024-01-07 | Discharge: 2024-01-07 | Attending: Surgery | Admitting: Surgery

## 2024-01-07 ENCOUNTER — Encounter: Payer: Self-pay | Admitting: Physician Assistant

## 2024-01-07 ENCOUNTER — Ambulatory Visit

## 2024-01-07 VITALS — BP 118/79 | HR 88 | Temp 98.0°F | Wt 148.0 lb

## 2024-01-07 DIAGNOSIS — M7989 Other specified soft tissue disorders: Secondary | ICD-10-CM | POA: Insufficient documentation

## 2024-01-07 DIAGNOSIS — I83892 Varicose veins of left lower extremities with other complications: Secondary | ICD-10-CM

## 2024-01-07 DIAGNOSIS — I872 Venous insufficiency (chronic) (peripheral): Secondary | ICD-10-CM | POA: Insufficient documentation

## 2024-01-07 NOTE — Progress Notes (Signed)
 Requested by:  Bernardo Fend, DO [address] [place],  [place] [address]  Reason for consultation: large varicose vein on LLE    History of Present Illness   Alicia Hahn is a 63 y.o. (06/07/1960) female who presents for evaluation of large varicose vein on LLE. She says this started to be noticeable in her left calf a while ago and has progressed up her leg and now to her mid thigh. She explains that it was never bothersome until it reach level behind her knee. She says this was very uncomfortable for a while behind the knee especially when walking. Since it is now extended further up the thigh it no longer is painful. She denies any aching, throbbing, burning, stinging, itching, swelling, bleeding or ulceration. She is a retired [employer] who worked at [employer] standing for 12 hours a day. She says she use to wear 15-20 mmHg knee high support hose but has not worn them in several years. She does not elevate her legs. She does not regularly exercise but she watches her 54 month old grandson 3 days per week. She has no history of DVT. No family history of DVT or venous disease. No prior vein interventions.  Venous symptoms include: large visible varicose vein of LLE Onset/duration:  > 1 year  Occupation:  retired [employer] Aggravating factors:none Alleviating factors: none Compression:  previously Helps:  yes Pain medications:  no Previous vein procedures:  no History of DVT:  no  Past Medical History:  Diagnosis Date   Acid reflux    occasionally, and takes pepcid OTC   Anxiety    Breast cancer (HCC)    01-2019   Cancer (HCC)    breast cancer   Depression    Recent   Gastric ulcer    GERD (gastroesophageal reflux disease)    Hyperlipidemia    Joint pain    Kidney stones    Lower extremity edema    Neuromuscular disorder (HCC)    Recent muscle pain   Personal history of chemotherapy    Personal history of radiation therapy    04-2019   Stomach ulcer     Vitamin D  deficiency     Past Surgical History:  Procedure Laterality Date   AUGMENTATION MAMMAPLASTY     BREAST IMPLANT EXCHANGE Bilateral 11/15/2020   Procedure: BREAST IMPLANT EXCHANGE;  Surgeon: Lowery Estefana RAMAN, DO;  Location: [facility];  Service: Plastics;  Laterality: Bilateral;   BREAST LUMPECTOMY     BREAST LUMPECTOMY WITH RADIOACTIVE SEED AND SENTINEL LYMPH NODE BIOPSY Right 01/27/2019   Procedure: RIGHT BREAST LUMPECTOMY WITH RADIOACTIVE SEED AND SENTINEL LYMPH NODE MAPPING;  Surgeon: Vanderbilt Ned, MD;  Location: [facility];  Service: General;  Laterality: Right;   COSMETIC SURGERY Bilateral 2000   breast aug/ lipo   LIPOSUCTION Bilateral 11/15/2020   Procedure: LIPOSUCTION;  Surgeon: Lowery Estefana RAMAN, DO;  Location: [facility];  Service: Plastics;  Laterality: Bilateral;   MASTOPEXY Bilateral 11/15/2020   Procedure: MASTOPEXY;  Surgeon: Lowery Estefana RAMAN, DO;  Location: [facility];  Service: Plastics;  Laterality: Bilateral;   PORT-A-CATH REMOVAL Right 04/05/2020   Procedure: REMOVAL PORT-A-CATH;  Surgeon: Vanderbilt Ned, MD;  Location: [facility];  Service: General;  Laterality: Right;   PORTACATH PLACEMENT Right 01/27/2019   Procedure: INSERTION PORT-A-CATH WITH ULTRASOUND;  Surgeon: Vanderbilt Ned, MD;  Location: [facility];  Service: General;  Laterality: Right;  Social History   Socioeconomic History   Marital status: Married    Spouse name: Not on file   Number of children: 2   Years of education: Not on file   Highest education level: Not on file  Occupational History   Occupation: retired  Tobacco Use   Smoking status: Former    Current packs/day: 0.00    Average packs/day: 0.3 packs/day for 5.0 years (1.3 ttl pk-yrs)    Types: Cigarettes    Start date: 01/20/1973    Quit date: 01/20/1978    Years since quitting: 45.9   Smokeless tobacco: Never    Tobacco comments:    1982  Vaping Use   Vaping status: Never Used  Substance and Sexual Activity   Alcohol use: Yes    Alcohol/week: 6.0 standard drinks of alcohol    Types: 6 Glasses of wine per week    Comment: occasional   Drug use: No   Sexual activity: Yes    Birth control/protection: Post-menopausal, None  Other Topics Concern   Not on file  Social History Narrative   Not on file   Social Drivers of Health   Tobacco Use: Medium Risk (01/07/2024)   Patient History    Smoking Tobacco Use: Former    Smokeless Tobacco Use: Never    Passive Exposure: Not on Actuary Strain: Not on file  Food Insecurity: Not on file  Transportation Needs: Not on file  Physical Activity: Not on file  Stress: Not on file  Social Connections: Not on file  Intimate Partner Violence: Not on file  Depression (PHQ2-9): Low Risk (12/25/2022)   Depression (PHQ2-9)    PHQ-2 Score: 0  Alcohol Screen: Not on file  Housing: Not on file  Utilities: Not on file  Health Literacy: Not on file    Family History  Problem Relation Age of Onset   Diabetes Mother    Heart disease Mother    Hyperlipidemia Mother    Breast cancer Mother 29   Cancer Mother        breast DCIS   Hearing loss Mother    Varicose Veins Mother    Cancer Father 18       prostate cancer    Diabetes Father    Hypertension Father    Hyperlipidemia Father    Heart disease Father    Hearing loss Father    Breast cancer Sister 11   Cancer Sister 35       breast cancer    Cancer Maternal Aunt 55       breast cancer    Cancer Maternal Grandfather        colon cancer   Colon cancer Maternal Grandfather    Esophageal cancer Neg Hx    Rectal cancer Neg Hx    Stomach cancer Neg Hx     Current Outpatient Medications  Medication Sig Dispense Refill   CALCIUM  PO Take 1,000 mg by mouth daily.     escitalopram  (LEXAPRO ) 5 MG tablet Take 1 tablet (5 mg total) by mouth daily. 90 tablet 3   estradiol  (ESTRACE )  0.01 % CREA vaginal cream Use a fingertip size amount of cream on your finger and place slightly past the vaginal entrance 3 times a week at bedtime. Do not use the applicator if this comes with the cream. 42.5 g 12   omeprazole (PRILOSEC OTC) 20 MG tablet Take 20 mg by mouth daily. As needed     rosuvastatin  (CRESTOR ) 20 MG  tablet Take 1 tablet (20 mg total) by mouth daily. 90 tablet 3   tamoxifen  (NOLVADEX ) 20 MG tablet Take 1 tablet (20 mg total) by mouth daily. 120 tablet 2   terbinafine  (LAMISIL ) 250 MG tablet Take 1 tablet (250 mg total) by mouth daily. 30 tablet 0   tirzepatide  (MOUNJARO) 5 MG/0.5ML Pen Inject 5 mg into the skin once a week.     Current Facility-Administered Medications  Medication Dose Route Frequency Provider Last Rate Last Admin   terbinafine  (LAMISIL ) tablet 250 mg  250 mg Oral Daily Hyatt, Max T, DPM        Allergies[1]  REVIEW OF SYSTEMS (negative unless checked):   Cardiac:  []  Chest pain or chest pressure? []  Shortness of breath upon activity? []  Shortness of breath when lying flat? []  Irregular heart rhythm?  Vascular:  []  Pain in calf, thigh, or hip brought on by walking? []  Pain in feet at night that wakes you up from your sleep? []  Blood clot in your veins? []  Leg swelling?  Pulmonary:  []  Oxygen at home? []  Productive cough? []  Wheezing?  Neurologic:  []  Sudden weakness in arms or legs? []  Sudden numbness in arms or legs? []  Sudden onset of difficult speaking or slurred speech? []  Temporary loss of vision in one eye? []  Problems with dizziness?  Gastrointestinal:  []  Blood in stool? []  Vomited blood?  Genitourinary:  []  Burning when urinating? []  Blood in urine?  Psychiatric:  []  Major depression  Hematologic:  []  Bleeding problems? []  Problems with blood clotting?  Dermatologic:  []  Rashes or ulcers?  Constitutional:  []  Fever or chills?  Ear/Nose/Throat:  []  Change in hearing? []  Nose bleeds? []  Sore  throat?  Musculoskeletal:  []  Back pain? []  Joint pain? []  Muscle pain?   Physical Examination     Vitals:   01/07/24 1329  Temp: 98 F (36.7 C)  TempSrc: Temporal  Weight: 148 lb (67.1 kg)   Body mass index is 26.22 kg/m.  General:  WDWN in NAD; vital signs documented above Gait: Normal HENT: WNL, normocephalic Pulmonary: normal non-labored breathing Cardiac: regular HR Abdomen: soft Vascular Exam/Pulses: 2+ DP pulses bilaterally, feet warm and well perfused Extremities: with one large varicose vein that traverses from mid medial left thigh down to mid medial left leg, without reticular veins, without edema, without stasis pigmentation, without lipodermatosclerosis, without ulcers  Musculoskeletal: no muscle wasting or atrophy  Neurologic: A&O X 3;  No focal weakness or paresthesias are detected Psychiatric:  The pt has Normal affect.  Non-invasive Vascular Imaging   BLE Venous Insufficiency Duplex (01/07/24):  LLE: No DVT and SVT GSV reflux SFJ to mid thigh GSV diameter  0.24 cm -0.57 cm No SSV reflux  No deep venous reflux   Medical Decision Making   Alicia Hahn is a 63 y.o. female who presents with: LLE chronic venous insufficiency with large varicose vein. Duplex shows no DVT or SVT. She does have reflux in her GSV. Her GSV is of adequate size in the mid thigh up to SFJ. No SSV reflux. She has a large varicosity that branches off of GSV in mid thigh and courses to the calf. Based on her duplex she could be a candidate for a venous ablation. I discussed likely need of a stab phlebectomy of the varicosity as well.  Based on the patient's history and examination, I recommend: daily elevation of 20-30 minutes above level of heart, daily compression stocking use, exercise, weight reduction, refraining from  prolonged sitting or standing. I discussed with the patient the use of her 20-30 mm thigh high compression stockings and need for 3 month trial of  such. Patient was provided with information about vein health The patient will follow up in 3 months with Dr. Sheree or Dr. Serene. Thank you for allowing us  to participate in this patient's care.   Teretha Damme, PA-C [facility] Office: [phone]  01/07/2024, 1:32 PM  Clinic MD:  [place]     [1] No Known Allergies

## 2024-01-07 NOTE — Patient Instructions (Signed)

## 2024-01-13 ENCOUNTER — Other Ambulatory Visit: Payer: Self-pay

## 2024-01-13 ENCOUNTER — Other Ambulatory Visit (HOSPITAL_COMMUNITY): Payer: Self-pay

## 2024-01-19 ENCOUNTER — Other Ambulatory Visit: Payer: Self-pay

## 2024-01-19 ENCOUNTER — Ambulatory Visit
Admission: RE | Admit: 2024-01-19 | Discharge: 2024-01-19 | Disposition: A | Discharge status: Home / Self Care | Source: Ambulatory Visit | Attending: Nurse Practitioner | Admitting: Nurse Practitioner

## 2024-01-19 DIAGNOSIS — C50411 Malignant neoplasm of upper-outer quadrant of right female breast: Secondary | ICD-10-CM

## 2024-01-19 MED ORDER — GADOPICLENOL 0.5 MMOL/ML IV SOLN
7.0000 mL | Freq: Once | INTRAVENOUS | Status: AC | PRN
  Administered 2024-01-19: 7 mL via INTRAVENOUS

## 2024-02-08 ENCOUNTER — Encounter: Payer: Self-pay | Admitting: Internal Medicine

## 2024-02-08 ENCOUNTER — Ambulatory Visit (INDEPENDENT_AMBULATORY_CARE_PROVIDER_SITE_OTHER): Admitting: Internal Medicine

## 2024-02-08 ENCOUNTER — Other Ambulatory Visit: Payer: Self-pay

## 2024-02-08 VITALS — BP 122/70 | HR 71 | Temp 98.2°F | Resp 16 | Ht 63.0 in | Wt 147.3 lb

## 2024-02-08 DIAGNOSIS — C50411 Malignant neoplasm of upper-outer quadrant of right female breast: Secondary | ICD-10-CM

## 2024-02-08 DIAGNOSIS — Z23 Encounter for immunization: Secondary | ICD-10-CM | POA: Diagnosis not present

## 2024-02-08 DIAGNOSIS — F419 Anxiety disorder, unspecified: Secondary | ICD-10-CM

## 2024-02-08 DIAGNOSIS — Z13820 Encounter for screening for osteoporosis: Secondary | ICD-10-CM | POA: Diagnosis not present

## 2024-02-08 DIAGNOSIS — Z170 Estrogen receptor positive status [ER+]: Secondary | ICD-10-CM | POA: Diagnosis not present

## 2024-02-08 DIAGNOSIS — E782 Mixed hyperlipidemia: Secondary | ICD-10-CM

## 2024-02-08 DIAGNOSIS — E2839 Other primary ovarian failure: Secondary | ICD-10-CM

## 2024-02-08 DIAGNOSIS — E663 Overweight: Secondary | ICD-10-CM

## 2024-02-08 DIAGNOSIS — D171 Benign lipomatous neoplasm of skin and subcutaneous tissue of trunk: Secondary | ICD-10-CM

## 2024-02-08 NOTE — Progress Notes (Signed)
 "  Established Patient Office Visit  Subjective   Patient ID: Alicia Hahn, female    DOB: 10-27-1960  Age: 64 y.o. MRN: [id number]  Chief Complaint  Patient presents with   Medical Management of Chronic Issues    6 month recheck    HPI  Patient is here to follow up on chronic medical conditions.   Discussed the use of AI scribe software for clinical note transcription with the patient, who gave verbal consent to proceed.  History of Present Illness Alicia Hahn is a 64 year old female who presents for a routine follow-up visit.  She reports no significant changes in health since the last visit and feels her current regimen is effective. She takes tirzepatide  5 mg obtained through a compact program because insurance does not cover it. She continues Lexapro  5 mg with good effect and has refills available for Crestor .  She has noticed a round, soft, movable subcutaneous mass larger than a quarter. She can feel its edges. It causes no pain or discomfort.  She recently turned 63, started Medicare, and has not yet been contacted for a Medicare wellness visit.  She has had a recent mammogram and breast MRI and alternates these every six months. Her next MRI is scheduled for December.  She needs to schedule a DEXA scan since her prior facility no longer offers it.   HLD: -Medications: Crestor  20 mg -Patient is compliant with above medications and reports no side effects. -Last lipid panel: Lipid Panel     Component Value Date/Time   CHOL 164 07/31/2023 1211   CHOL 186 06/12/2021 1018   TRIG 114 07/31/2023 1211   HDL 57 07/31/2023 1211   HDL 77 06/12/2021 1018   CHOLHDL 2.9 07/31/2023 1211   LDLCALC 86 07/31/2023 1211   LABVLDL 22 06/12/2021 1018    Hx of Breast Cancer -Following with Oncology -Diagnosed in 2020, s/p right lumpectomy and adjuvant radiation. Completed chemotherapy in 2021 -Mammogram Birads-2 7/25  Anxiety -Currently on Lexapro  5  mg -Symptoms well controlled and tolerating medication well, happy with current dose  Weight Management -Still on Zepbound  5 mg weekly through compounding pharmacy, tolerating well   Health Maintenance: -Blood work UTD -Mammogram MRI 12/25 Birads-2   -Colonoscopy 5/21, repeat in 10 years -Pap 5/22 negative  -Due for Prevnar 20 today   Patient Active Problem List   Diagnosis Date Noted   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 (HCC) 06/12/2021   Osteopenia 06/12/2021   Pain of left hand 11/05/2020   History of breast cancer 08/08/2020   Acquired absence of breast 08/08/2020   Class 1 obesity with serious comorbidity and body mass index (BMI) of 30.0 to 30.9 in adult 05/02/2020   Effects of radiation 03/23/2020   Postoperative breast asymmetry 03/23/2020   Elevated liver enzymes 04/25/2019   Hyperlipidemia 04/25/2019   Chemoprophylaxis 04/25/2019   Port-A-Cath in place 03/30/2019   Cervical radiculopathy at C7 on L  02/02/2019   Psychophysiological insomnia 02/02/2019   Numbness and tingling of hand-  C7 distribution L hand 02/02/2019   Stress and adjustment reaction 02/02/2019   Genetic testing 12/30/2018   Malignant neoplasm of upper-outer quadrant of right breast in female, estrogen receptor positive (HCC) 12/28/2018   Malignant tumor of breast (HCC) 12/22/2018   Family history of breast cancer in sister(age33) and mother 11/02/2018   Plantar fasciitis of left foot 11/02/2018   Muscle ache of extremity-bilateral calf muscles 11/02/2018   Impingement syndrome of right  shoulder 05/11/2017   Past Medical History:  Diagnosis Date   Acid reflux    occasionally, and takes pepcid OTC   Anxiety    Breast cancer (HCC)    01-2019   Cancer (HCC)    breast cancer   Depression    Recent   Gastric ulcer    GERD (gastroesophageal reflux disease)    Hyperlipidemia    Joint pain    Kidney stones    Lower extremity edema    Neuromuscular disorder (HCC)    Recent muscle  pain   Personal history of chemotherapy    Personal history of radiation therapy    04-2019   Stomach ulcer    Vitamin D  deficiency    Past Surgical History:  Procedure Laterality Date   AUGMENTATION MAMMAPLASTY     BREAST IMPLANT EXCHANGE Bilateral 11/15/2020   Procedure: BREAST IMPLANT EXCHANGE;  Surgeon: Lowery Estefana RAMAN, DO;  Location: [facility];  Service: Plastics;  Laterality: Bilateral;   BREAST LUMPECTOMY     BREAST LUMPECTOMY WITH RADIOACTIVE SEED AND SENTINEL LYMPH NODE BIOPSY Right 01/27/2019   Procedure: RIGHT BREAST LUMPECTOMY WITH RADIOACTIVE SEED AND SENTINEL LYMPH NODE MAPPING;  Surgeon: Vanderbilt Ned, MD;  Location: [facility];  Service: General;  Laterality: Right;   COSMETIC SURGERY Bilateral 2000   breast aug/ lipo   LIPOSUCTION Bilateral 11/15/2020   Procedure: LIPOSUCTION;  Surgeon: Lowery Estefana RAMAN, DO;  Location: [facility];  Service: Plastics;  Laterality: Bilateral;   MASTOPEXY Bilateral 11/15/2020   Procedure: MASTOPEXY;  Surgeon: Lowery Estefana RAMAN, DO;  Location: [facility];  Service: Plastics;  Laterality: Bilateral;   PORT-A-CATH REMOVAL Right 04/05/2020   Procedure: REMOVAL PORT-A-CATH;  Surgeon: Vanderbilt Ned, MD;  Location: [facility];  Service: General;  Laterality: Right;   PORTACATH PLACEMENT Right 01/27/2019   Procedure: INSERTION PORT-A-CATH WITH ULTRASOUND;  Surgeon: Vanderbilt Ned, MD;  Location: [facility];  Service: General;  Laterality: Right;   Social History   Tobacco Use   Smoking status: Former    Current packs/day: 0.00    Average packs/day: 0.3 packs/day for 5.0 years (1.3 ttl pk-yrs)    Types: Cigarettes    Start date: 01/20/1973    Quit date: 01/20/1978    Years since quitting: 46.0   Smokeless tobacco: Never   Tobacco comments:    1982  Vaping Use   Vaping status: Never Used  Substance Use Topics   Alcohol use: Yes     Alcohol/week: 6.0 standard drinks of alcohol    Types: 6 Glasses of wine per week    Comment: occasional   Drug use: No   Social History   Socioeconomic History   Marital status: Married    Spouse name: Not on file   Number of children: 2   Years of education: Not on file   Highest education level: Not on file  Occupational History   Occupation: retired  Tobacco Use   Smoking status: Former    Current packs/day: 0.00    Average packs/day: 0.3 packs/day for 5.0 years (1.3 ttl pk-yrs)    Types: Cigarettes    Start date: 01/20/1973    Quit date: 01/20/1978    Years since quitting: 46.0   Smokeless tobacco: Never   Tobacco comments:    1982  Vaping Use   Vaping status: Never Used  Substance and Sexual Activity   Alcohol use: Yes    Alcohol/week: 6.0 standard  drinks of alcohol    Types: 6 Glasses of wine per week    Comment: occasional   Drug use: No   Sexual activity: Yes    Birth control/protection: Post-menopausal, None  Other Topics Concern   Not on file  Social History Narrative   Not on file   Social Drivers of Health   Tobacco Use: Medium Risk (02/08/2024)   Patient History    Smoking Tobacco Use: Former    Smokeless Tobacco Use: Never    Passive Exposure: Not on Actuary Strain: Not on file  Food Insecurity: Not on file  Transportation Needs: Not on file  Physical Activity: Not on file  Stress: Not on file  Social Connections: Not on file  Intimate Partner Violence: Not on file  Depression (PHQ2-9): Low Risk (02/08/2024)   Depression (PHQ2-9)    PHQ-2 Score: 0  Alcohol Screen: Not on file  Housing: Not on file  Utilities: Not on file  Health Literacy: Not on file   Family Status  Relation Name Status   Mother Leita Alive   Father Danny Alive   Sister Amy Alive   Brother  Alive   MGM  Deceased   Mat Aunt Conservation Officer, [employer] (Not Specified)   MGF Celena (Not Specified)   Neg Hx  (Not Specified)  No partnership data on file   Family History   Problem Relation Age of Onset   Diabetes Mother    Heart disease Mother    Hyperlipidemia Mother    Breast cancer Mother 76   Cancer Mother        breast DCIS   Hearing loss Mother    Varicose Veins Mother    Cancer Father 36       prostate cancer    Diabetes Father    Hypertension Father    Hyperlipidemia Father    Heart disease Father    Hearing loss Father    Breast cancer Sister 37   Cancer Sister 7       breast cancer    Cancer Maternal Aunt 55       breast cancer    Cancer Maternal Grandfather        colon cancer   Colon cancer Maternal Grandfather    Esophageal cancer Neg Hx    Rectal cancer Neg Hx    Stomach cancer Neg Hx    No Known Allergies    Review of Systems  All other systems reviewed and are negative.     Objective:     BP 122/70 (Cuff Size: Normal)   Pulse 71   Temp 98.2 F (36.8 C) (Oral)   Resp 16   Ht 5' 3 (1.6 m)   Wt 147 lb 4.8 oz (66.8 kg)   SpO2 99%   BMI 26.09 kg/m  BP Readings from Last 3 Encounters:  02/08/24 122/70  01/07/24 118/79  12/02/23 128/80   Wt Readings from Last 3 Encounters:  02/08/24 147 lb 4.8 oz (66.8 kg)  01/07/24 148 lb (67.1 kg)  12/02/23 148 lb 12.8 oz (67.5 kg)      Physical Exam Constitutional:      Appearance: Normal appearance.  HENT:     Head: Normocephalic and atraumatic.  Eyes:     Conjunctiva/sclera: Conjunctivae normal.  Cardiovascular:     Rate and Rhythm: Normal rate and regular rhythm.  Pulmonary:     Effort: Pulmonary effort is normal.     Breath sounds: Normal breath sounds.  Abdominal:  Comments: Quarter sized lipoma just over left lower ribcage  Skin:    General: Skin is warm and dry.  Neurological:     General: No focal deficit present.     Mental Status: She is alert. Mental status is at baseline.  Psychiatric:        Mood and Affect: Mood normal.        Behavior: Behavior normal.      No results found for any visits on 02/08/24.  Last CBC Lab Results   Component Value Date   WBC 4.5 12/02/2023   HGB 15.4 (H) 12/02/2023   HCT 45.6 12/02/2023   MCV 87.9 12/02/2023   MCH 29.7 12/02/2023   RDW 12.5 12/02/2023   PLT 169 12/02/2023   Last metabolic panel Lab Results  Component Value Date   GLUCOSE 93 12/02/2023   NA 138 12/02/2023   K 4.4 12/02/2023   CL 105 12/02/2023   CO2 29 12/02/2023   BUN 15 12/02/2023   CREATININE 0.84 12/02/2023   GFRNONAA >60 12/02/2023   CALCIUM  9.1 12/02/2023   PROT 6.8 12/02/2023   ALBUMIN 4.1 12/02/2023   LABGLOB 2.2 06/12/2021   AGRATIO 2.0 06/12/2021   BILITOT 0.4 12/02/2023   ALKPHOS 47 12/02/2023   AST 18 12/02/2023   ALT 16 12/02/2023   ANIONGAP 4 (L) 12/02/2023   Last lipids Lab Results  Component Value Date   CHOL 164 07/31/2023   HDL 57 07/31/2023   LDLCALC 86 07/31/2023   TRIG 114 07/31/2023   CHOLHDL 2.9 07/31/2023   Last hemoglobin A1c Lab Results  Component Value Date   HGBA1C 5.5 06/12/2021   Last thyroid  functions Lab Results  Component Value Date   TSH 1.600 06/12/2021   T3TOTAL 88 02/02/2019   Last vitamin D  Lab Results  Component Value Date   VD25OH 46 07/31/2023   Last vitamin B12 and Folate Lab Results  Component Value Date   VITAMINB12 833 03/29/2020      The 10-year ASCVD risk score (Arnett DK, et al., 2019) is: 4.5%    Assessment & Plan:   Assessment & Plan  Assessment & Plan Pneumococcal immunization Received pneumococcal vaccine today.  Lipoma of trunk Lipoma on the trunk, larger than a quarter, soft, movable, and with a defined edge, consistent with a lipoma. - Monitor size and symptoms. - Consider ultrasound if size increases or discomfort occurs.  Anxiety Currently on Lexapro  5 mg, which is effective. - Continue Lexapro  5 mg.  Hyperlipidemia Well-managed with Crestor . Recent cholesterol levels were good. - Continue Crestor .  Breast cancer, history of Breast cancer with alternating MRI and mammogram every six months. Last MRI  was in December, next MRI due in December next year. - Schedule mammogram next time. - Schedule MRI in December next year.  Osteoporosis screening Due for DEXA scan. Previous scan was in June 2023. Breast center no longer performs DEXA scans. - Ordered DEXA scan. - Provided mammogram card for DEXA scan scheduling.   - Pneumococcal conjugate vaccine 20-valent (Prevnar 20) - DG Bone Density; Future   Return in about 6 months (around 08/07/2024).    Sharyle Fischer, DO "

## 2024-02-16 ENCOUNTER — Ambulatory Visit: Payer: Self-pay | Admitting: Nurse Practitioner

## 2024-02-24 ENCOUNTER — Ambulatory Visit: Admitting: Podiatry

## 2024-03-02 ENCOUNTER — Ambulatory Visit: Payer: Self-pay | Admitting: Podiatry

## 2024-03-03 ENCOUNTER — Other Ambulatory Visit

## 2024-04-06 ENCOUNTER — Ambulatory Visit: Admitting: Vascular Surgery

## 2024-06-01 ENCOUNTER — Inpatient Hospital Stay: Payer: Self-pay | Attending: Nurse Practitioner

## 2024-06-01 ENCOUNTER — Inpatient Hospital Stay: Payer: Self-pay | Admitting: Nurse Practitioner

## 2024-08-09 ENCOUNTER — Ambulatory Visit: Admitting: Internal Medicine
